# Patient Record
Sex: Female | Born: 1960 | Race: White | Hispanic: No | Marital: Single | Smoking: Current every day smoker
Health system: Southern US, Community
[De-identification: ages and names within clinical notes are randomized; demographics above are authoritative.]

## PROBLEM LIST (undated history)

## (undated) DIAGNOSIS — J449 Chronic obstructive pulmonary disease, unspecified: Secondary | ICD-10-CM

## (undated) DIAGNOSIS — N289 Disorder of kidney and ureter, unspecified: Secondary | ICD-10-CM

## (undated) DIAGNOSIS — M359 Systemic involvement of connective tissue, unspecified: Secondary | ICD-10-CM

## (undated) DIAGNOSIS — K589 Irritable bowel syndrome without diarrhea: Secondary | ICD-10-CM

## (undated) DIAGNOSIS — R269 Unspecified abnormalities of gait and mobility: Secondary | ICD-10-CM

## (undated) DIAGNOSIS — M4800 Spinal stenosis, site unspecified: Secondary | ICD-10-CM

## (undated) DIAGNOSIS — I10 Essential (primary) hypertension: Secondary | ICD-10-CM

## (undated) DIAGNOSIS — M1990 Unspecified osteoarthritis, unspecified site: Secondary | ICD-10-CM

## (undated) DIAGNOSIS — W19XXXA Unspecified fall, initial encounter: Secondary | ICD-10-CM

## (undated) DIAGNOSIS — E041 Nontoxic single thyroid nodule: Secondary | ICD-10-CM

## (undated) DIAGNOSIS — I2510 Atherosclerotic heart disease of native coronary artery without angina pectoris: Secondary | ICD-10-CM

## (undated) DIAGNOSIS — T7840XA Allergy, unspecified, initial encounter: Secondary | ICD-10-CM

## (undated) DIAGNOSIS — F419 Anxiety disorder, unspecified: Secondary | ICD-10-CM

## (undated) DIAGNOSIS — K635 Polyp of colon: Secondary | ICD-10-CM

## (undated) DIAGNOSIS — G43909 Migraine, unspecified, not intractable, without status migrainosus: Secondary | ICD-10-CM

## (undated) DIAGNOSIS — M797 Fibromyalgia: Secondary | ICD-10-CM

## (undated) DIAGNOSIS — F319 Bipolar disorder, unspecified: Secondary | ICD-10-CM

## (undated) DIAGNOSIS — N189 Chronic kidney disease, unspecified: Secondary | ICD-10-CM

## (undated) DIAGNOSIS — G9349 Other encephalopathy: Secondary | ICD-10-CM

## (undated) DIAGNOSIS — M549 Dorsalgia, unspecified: Secondary | ICD-10-CM

## (undated) DIAGNOSIS — E785 Hyperlipidemia, unspecified: Secondary | ICD-10-CM

## (undated) DIAGNOSIS — R202 Paresthesia of skin: Secondary | ICD-10-CM

## (undated) DIAGNOSIS — K219 Gastro-esophageal reflux disease without esophagitis: Secondary | ICD-10-CM

## (undated) DIAGNOSIS — G459 Transient cerebral ischemic attack, unspecified: Secondary | ICD-10-CM

## (undated) DIAGNOSIS — R296 Repeated falls: Secondary | ICD-10-CM

## (undated) DIAGNOSIS — R200 Anesthesia of skin: Secondary | ICD-10-CM

## (undated) HISTORY — DX: Unspecified osteoarthritis, unspecified site: M19.90

## (undated) HISTORY — DX: Anxiety disorder, unspecified: F41.9

## (undated) HISTORY — PX: TUBAL LIGATION: SHX77

## (undated) HISTORY — DX: Hyperlipidemia, unspecified: E78.5

## (undated) HISTORY — DX: Allergy, unspecified, initial encounter: T78.40XA

## (undated) HISTORY — DX: Transient cerebral ischemic attack, unspecified: G45.9

## (undated) HISTORY — DX: Polyp of colon: K63.5

## (undated) HISTORY — PX: MOUTH SURGERY: SHX715

## (undated) HISTORY — DX: Migraine, unspecified, not intractable, without status migrainosus: G43.909

## (undated) HISTORY — DX: Essential (primary) hypertension: I10

## (undated) HISTORY — DX: Fibromyalgia: M79.7

## (undated) HISTORY — DX: Gastro-esophageal reflux disease without esophagitis: K21.9

## (undated) HISTORY — PX: NASAL SINUS SURGERY: SHX719

## (undated) HISTORY — DX: Chronic kidney disease, unspecified: N18.9

## (undated) HISTORY — DX: Irritable bowel syndrome, unspecified: K58.9

---

## 2003-06-05 DIAGNOSIS — I639 Cerebral infarction, unspecified: Secondary | ICD-10-CM

## 2003-06-05 HISTORY — DX: Cerebral infarction, unspecified: I63.9

## 2003-10-31 ENCOUNTER — Other Ambulatory Visit: Payer: Self-pay

## 2004-06-16 ENCOUNTER — Emergency Department: Payer: Self-pay | Admitting: Emergency Medicine

## 2007-01-11 ENCOUNTER — Emergency Department: Payer: Self-pay | Admitting: Emergency Medicine

## 2008-01-14 ENCOUNTER — Emergency Department: Payer: Self-pay | Admitting: Emergency Medicine

## 2008-01-14 ENCOUNTER — Other Ambulatory Visit: Payer: Self-pay

## 2009-04-18 ENCOUNTER — Ambulatory Visit: Payer: Self-pay | Admitting: Family Medicine

## 2009-05-03 ENCOUNTER — Encounter: Payer: Self-pay | Admitting: Family Medicine

## 2009-05-04 ENCOUNTER — Encounter: Payer: Self-pay | Admitting: Family Medicine

## 2009-06-04 ENCOUNTER — Encounter: Payer: Self-pay | Admitting: Family Medicine

## 2009-09-27 ENCOUNTER — Ambulatory Visit: Payer: Self-pay | Admitting: Pain Medicine

## 2009-10-03 ENCOUNTER — Ambulatory Visit: Payer: Self-pay | Admitting: Pain Medicine

## 2009-10-11 ENCOUNTER — Ambulatory Visit: Payer: Self-pay | Admitting: Pain Medicine

## 2009-11-01 ENCOUNTER — Ambulatory Visit: Payer: Self-pay | Admitting: Pain Medicine

## 2009-11-09 ENCOUNTER — Ambulatory Visit: Payer: Self-pay | Admitting: Pain Medicine

## 2009-11-28 ENCOUNTER — Ambulatory Visit: Payer: Self-pay | Admitting: Rheumatology

## 2009-11-29 ENCOUNTER — Ambulatory Visit: Payer: Self-pay | Admitting: Pain Medicine

## 2009-12-07 ENCOUNTER — Ambulatory Visit: Payer: Self-pay | Admitting: Pain Medicine

## 2009-12-09 ENCOUNTER — Emergency Department: Payer: Self-pay | Admitting: Emergency Medicine

## 2009-12-30 ENCOUNTER — Observation Stay: Payer: Self-pay | Admitting: Internal Medicine

## 2010-01-10 ENCOUNTER — Ambulatory Visit: Payer: Self-pay | Admitting: Pain Medicine

## 2010-02-13 ENCOUNTER — Emergency Department: Payer: Self-pay | Admitting: Emergency Medicine

## 2010-02-21 ENCOUNTER — Encounter: Payer: Self-pay | Admitting: Rheumatology

## 2010-07-15 ENCOUNTER — Emergency Department: Payer: Self-pay | Admitting: Emergency Medicine

## 2010-10-16 ENCOUNTER — Ambulatory Visit: Payer: Self-pay | Admitting: Pain Medicine

## 2012-01-08 DIAGNOSIS — G4700 Insomnia, unspecified: Secondary | ICD-10-CM | POA: Diagnosis not present

## 2012-01-08 DIAGNOSIS — Z23 Encounter for immunization: Secondary | ICD-10-CM | POA: Diagnosis not present

## 2012-01-08 DIAGNOSIS — R7309 Other abnormal glucose: Secondary | ICD-10-CM | POA: Diagnosis not present

## 2012-01-08 DIAGNOSIS — IMO0001 Reserved for inherently not codable concepts without codable children: Secondary | ICD-10-CM | POA: Diagnosis not present

## 2012-01-08 DIAGNOSIS — F319 Bipolar disorder, unspecified: Secondary | ICD-10-CM | POA: Diagnosis not present

## 2012-01-10 DIAGNOSIS — R7309 Other abnormal glucose: Secondary | ICD-10-CM | POA: Diagnosis not present

## 2012-01-10 DIAGNOSIS — I10 Essential (primary) hypertension: Secondary | ICD-10-CM | POA: Diagnosis not present

## 2012-05-12 DIAGNOSIS — Z23 Encounter for immunization: Secondary | ICD-10-CM | POA: Diagnosis not present

## 2012-12-03 DIAGNOSIS — IMO0002 Reserved for concepts with insufficient information to code with codable children: Secondary | ICD-10-CM | POA: Diagnosis not present

## 2012-12-03 DIAGNOSIS — M25559 Pain in unspecified hip: Secondary | ICD-10-CM | POA: Diagnosis not present

## 2012-12-03 DIAGNOSIS — IMO0001 Reserved for inherently not codable concepts without codable children: Secondary | ICD-10-CM | POA: Diagnosis not present

## 2012-12-16 DIAGNOSIS — R7309 Other abnormal glucose: Secondary | ICD-10-CM | POA: Diagnosis not present

## 2012-12-16 DIAGNOSIS — Z8673 Personal history of transient ischemic attack (TIA), and cerebral infarction without residual deficits: Secondary | ICD-10-CM | POA: Diagnosis not present

## 2012-12-16 DIAGNOSIS — IMO0002 Reserved for concepts with insufficient information to code with codable children: Secondary | ICD-10-CM | POA: Diagnosis not present

## 2012-12-16 DIAGNOSIS — I10 Essential (primary) hypertension: Secondary | ICD-10-CM | POA: Diagnosis not present

## 2012-12-22 DIAGNOSIS — K589 Irritable bowel syndrome without diarrhea: Secondary | ICD-10-CM | POA: Diagnosis not present

## 2012-12-22 DIAGNOSIS — IMO0002 Reserved for concepts with insufficient information to code with codable children: Secondary | ICD-10-CM | POA: Diagnosis not present

## 2012-12-22 DIAGNOSIS — R319 Hematuria, unspecified: Secondary | ICD-10-CM | POA: Diagnosis not present

## 2012-12-23 DIAGNOSIS — I10 Essential (primary) hypertension: Secondary | ICD-10-CM | POA: Diagnosis not present

## 2012-12-23 DIAGNOSIS — M329 Systemic lupus erythematosus, unspecified: Secondary | ICD-10-CM | POA: Diagnosis not present

## 2012-12-23 DIAGNOSIS — R319 Hematuria, unspecified: Secondary | ICD-10-CM | POA: Diagnosis not present

## 2012-12-23 DIAGNOSIS — R7309 Other abnormal glucose: Secondary | ICD-10-CM | POA: Diagnosis not present

## 2012-12-23 DIAGNOSIS — Z1159 Encounter for screening for other viral diseases: Secondary | ICD-10-CM | POA: Diagnosis not present

## 2012-12-23 DIAGNOSIS — E785 Hyperlipidemia, unspecified: Secondary | ICD-10-CM | POA: Diagnosis not present

## 2012-12-29 ENCOUNTER — Ambulatory Visit: Payer: Self-pay | Admitting: Family Medicine

## 2012-12-29 DIAGNOSIS — M5137 Other intervertebral disc degeneration, lumbosacral region: Secondary | ICD-10-CM | POA: Diagnosis not present

## 2012-12-29 DIAGNOSIS — M545 Low back pain, unspecified: Secondary | ICD-10-CM | POA: Diagnosis not present

## 2012-12-29 DIAGNOSIS — N289 Disorder of kidney and ureter, unspecified: Secondary | ICD-10-CM | POA: Diagnosis not present

## 2012-12-30 ENCOUNTER — Ambulatory Visit: Payer: Self-pay | Admitting: Family Medicine

## 2012-12-30 DIAGNOSIS — N289 Disorder of kidney and ureter, unspecified: Secondary | ICD-10-CM | POA: Diagnosis not present

## 2012-12-30 DIAGNOSIS — R948 Abnormal results of function studies of other organs and systems: Secondary | ICD-10-CM | POA: Diagnosis not present

## 2012-12-30 DIAGNOSIS — M549 Dorsalgia, unspecified: Secondary | ICD-10-CM | POA: Diagnosis not present

## 2012-12-30 DIAGNOSIS — R52 Pain, unspecified: Secondary | ICD-10-CM | POA: Diagnosis not present

## 2012-12-30 DIAGNOSIS — R944 Abnormal results of kidney function studies: Secondary | ICD-10-CM | POA: Diagnosis not present

## 2012-12-31 DIAGNOSIS — IMO0002 Reserved for concepts with insufficient information to code with codable children: Secondary | ICD-10-CM | POA: Diagnosis not present

## 2012-12-31 DIAGNOSIS — R9389 Abnormal findings on diagnostic imaging of other specified body structures: Secondary | ICD-10-CM | POA: Diagnosis not present

## 2013-01-12 ENCOUNTER — Ambulatory Visit: Payer: Self-pay | Admitting: Gastroenterology

## 2013-01-12 DIAGNOSIS — Z887 Allergy status to serum and vaccine status: Secondary | ICD-10-CM | POA: Diagnosis not present

## 2013-01-12 DIAGNOSIS — F17200 Nicotine dependence, unspecified, uncomplicated: Secondary | ICD-10-CM | POA: Diagnosis not present

## 2013-01-12 DIAGNOSIS — M4800 Spinal stenosis, site unspecified: Secondary | ICD-10-CM | POA: Diagnosis not present

## 2013-01-12 DIAGNOSIS — R1310 Dysphagia, unspecified: Secondary | ICD-10-CM | POA: Diagnosis not present

## 2013-01-12 DIAGNOSIS — Z885 Allergy status to narcotic agent status: Secondary | ICD-10-CM | POA: Diagnosis not present

## 2013-01-12 DIAGNOSIS — I10 Essential (primary) hypertension: Secondary | ICD-10-CM | POA: Diagnosis not present

## 2013-01-12 DIAGNOSIS — Z1211 Encounter for screening for malignant neoplasm of colon: Secondary | ICD-10-CM | POA: Diagnosis not present

## 2013-01-12 DIAGNOSIS — Z79899 Other long term (current) drug therapy: Secondary | ICD-10-CM | POA: Diagnosis not present

## 2013-01-12 DIAGNOSIS — D126 Benign neoplasm of colon, unspecified: Secondary | ICD-10-CM | POA: Diagnosis not present

## 2013-01-12 DIAGNOSIS — M329 Systemic lupus erythematosus, unspecified: Secondary | ICD-10-CM | POA: Diagnosis not present

## 2013-01-12 DIAGNOSIS — K648 Other hemorrhoids: Secondary | ICD-10-CM | POA: Diagnosis not present

## 2013-01-13 LAB — PATHOLOGY REPORT

## 2013-01-14 DIAGNOSIS — IMO0001 Reserved for inherently not codable concepts without codable children: Secondary | ICD-10-CM | POA: Diagnosis not present

## 2013-01-27 DIAGNOSIS — IMO0002 Reserved for concepts with insufficient information to code with codable children: Secondary | ICD-10-CM | POA: Diagnosis not present

## 2013-01-27 DIAGNOSIS — M5137 Other intervertebral disc degeneration, lumbosacral region: Secondary | ICD-10-CM | POA: Diagnosis not present

## 2013-04-06 DIAGNOSIS — Z124 Encounter for screening for malignant neoplasm of cervix: Secondary | ICD-10-CM | POA: Diagnosis not present

## 2013-04-06 DIAGNOSIS — Z0000 Encounter for general adult medical examination without abnormal findings: Secondary | ICD-10-CM | POA: Diagnosis not present

## 2013-04-06 DIAGNOSIS — Z23 Encounter for immunization: Secondary | ICD-10-CM | POA: Diagnosis not present

## 2013-04-06 DIAGNOSIS — Z1211 Encounter for screening for malignant neoplasm of colon: Secondary | ICD-10-CM | POA: Diagnosis not present

## 2013-04-06 DIAGNOSIS — Q619 Cystic kidney disease, unspecified: Secondary | ICD-10-CM | POA: Diagnosis not present

## 2013-04-06 LAB — HM MAMMOGRAPHY

## 2013-04-06 LAB — HM COLONOSCOPY

## 2013-04-06 LAB — HM PAP SMEAR

## 2013-04-09 ENCOUNTER — Ambulatory Visit: Payer: Self-pay | Admitting: Family Medicine

## 2013-04-09 DIAGNOSIS — N289 Disorder of kidney and ureter, unspecified: Secondary | ICD-10-CM | POA: Diagnosis not present

## 2013-04-09 DIAGNOSIS — IMO0002 Reserved for concepts with insufficient information to code with codable children: Secondary | ICD-10-CM | POA: Diagnosis not present

## 2013-04-09 DIAGNOSIS — I10 Essential (primary) hypertension: Secondary | ICD-10-CM | POA: Diagnosis not present

## 2013-04-09 DIAGNOSIS — F319 Bipolar disorder, unspecified: Secondary | ICD-10-CM | POA: Diagnosis not present

## 2013-04-09 DIAGNOSIS — IMO0001 Reserved for inherently not codable concepts without codable children: Secondary | ICD-10-CM | POA: Diagnosis not present

## 2013-04-09 DIAGNOSIS — N200 Calculus of kidney: Secondary | ICD-10-CM | POA: Diagnosis not present

## 2013-05-18 DIAGNOSIS — M1990 Unspecified osteoarthritis, unspecified site: Secondary | ICD-10-CM | POA: Diagnosis not present

## 2013-05-18 DIAGNOSIS — M549 Dorsalgia, unspecified: Secondary | ICD-10-CM | POA: Diagnosis not present

## 2013-05-18 DIAGNOSIS — G5600 Carpal tunnel syndrome, unspecified upper limb: Secondary | ICD-10-CM | POA: Diagnosis not present

## 2013-05-19 DIAGNOSIS — E785 Hyperlipidemia, unspecified: Secondary | ICD-10-CM | POA: Diagnosis not present

## 2013-05-19 DIAGNOSIS — M329 Systemic lupus erythematosus, unspecified: Secondary | ICD-10-CM | POA: Diagnosis not present

## 2013-05-19 DIAGNOSIS — I10 Essential (primary) hypertension: Secondary | ICD-10-CM | POA: Diagnosis not present

## 2013-05-19 DIAGNOSIS — R7309 Other abnormal glucose: Secondary | ICD-10-CM | POA: Diagnosis not present

## 2013-05-21 DIAGNOSIS — N289 Disorder of kidney and ureter, unspecified: Secondary | ICD-10-CM | POA: Diagnosis not present

## 2013-05-21 DIAGNOSIS — R310 Gross hematuria: Secondary | ICD-10-CM | POA: Diagnosis not present

## 2013-06-30 DIAGNOSIS — I10 Essential (primary) hypertension: Secondary | ICD-10-CM | POA: Diagnosis not present

## 2013-06-30 DIAGNOSIS — D631 Anemia in chronic kidney disease: Secondary | ICD-10-CM | POA: Diagnosis not present

## 2013-06-30 DIAGNOSIS — N2581 Secondary hyperparathyroidism of renal origin: Secondary | ICD-10-CM | POA: Diagnosis not present

## 2013-06-30 DIAGNOSIS — N183 Chronic kidney disease, stage 3 unspecified: Secondary | ICD-10-CM | POA: Diagnosis not present

## 2013-07-14 DIAGNOSIS — M76899 Other specified enthesopathies of unspecified lower limb, excluding foot: Secondary | ICD-10-CM | POA: Diagnosis not present

## 2013-07-14 DIAGNOSIS — K219 Gastro-esophageal reflux disease without esophagitis: Secondary | ICD-10-CM | POA: Diagnosis not present

## 2013-07-14 DIAGNOSIS — I10 Essential (primary) hypertension: Secondary | ICD-10-CM | POA: Diagnosis not present

## 2013-07-14 DIAGNOSIS — G4700 Insomnia, unspecified: Secondary | ICD-10-CM | POA: Diagnosis not present

## 2013-08-25 DIAGNOSIS — F3131 Bipolar disorder, current episode depressed, mild: Secondary | ICD-10-CM | POA: Diagnosis not present

## 2013-08-25 DIAGNOSIS — G4700 Insomnia, unspecified: Secondary | ICD-10-CM | POA: Diagnosis not present

## 2013-08-25 DIAGNOSIS — I10 Essential (primary) hypertension: Secondary | ICD-10-CM | POA: Diagnosis not present

## 2013-08-25 DIAGNOSIS — E785 Hyperlipidemia, unspecified: Secondary | ICD-10-CM | POA: Diagnosis not present

## 2013-09-01 DIAGNOSIS — I10 Essential (primary) hypertension: Secondary | ICD-10-CM | POA: Diagnosis not present

## 2013-09-01 DIAGNOSIS — D631 Anemia in chronic kidney disease: Secondary | ICD-10-CM | POA: Diagnosis not present

## 2013-09-01 DIAGNOSIS — N039 Chronic nephritic syndrome with unspecified morphologic changes: Secondary | ICD-10-CM | POA: Diagnosis not present

## 2013-09-01 DIAGNOSIS — N2581 Secondary hyperparathyroidism of renal origin: Secondary | ICD-10-CM | POA: Diagnosis not present

## 2013-09-01 DIAGNOSIS — N183 Chronic kidney disease, stage 3 unspecified: Secondary | ICD-10-CM | POA: Diagnosis not present

## 2013-09-12 ENCOUNTER — Emergency Department: Payer: Self-pay | Admitting: Emergency Medicine

## 2013-09-12 DIAGNOSIS — R51 Headache: Secondary | ICD-10-CM | POA: Diagnosis not present

## 2013-09-12 DIAGNOSIS — Z8673 Personal history of transient ischemic attack (TIA), and cerebral infarction without residual deficits: Secondary | ICD-10-CM | POA: Diagnosis not present

## 2013-09-12 DIAGNOSIS — F410 Panic disorder [episodic paroxysmal anxiety] without agoraphobia: Secondary | ICD-10-CM | POA: Diagnosis not present

## 2013-09-12 DIAGNOSIS — I10 Essential (primary) hypertension: Secondary | ICD-10-CM | POA: Diagnosis not present

## 2013-09-12 DIAGNOSIS — R209 Unspecified disturbances of skin sensation: Secondary | ICD-10-CM | POA: Diagnosis not present

## 2013-09-12 DIAGNOSIS — Z9889 Other specified postprocedural states: Secondary | ICD-10-CM | POA: Diagnosis not present

## 2013-09-12 DIAGNOSIS — F17200 Nicotine dependence, unspecified, uncomplicated: Secondary | ICD-10-CM | POA: Diagnosis not present

## 2013-09-12 DIAGNOSIS — R197 Diarrhea, unspecified: Secondary | ICD-10-CM | POA: Diagnosis not present

## 2013-09-12 LAB — COMPREHENSIVE METABOLIC PANEL
ALK PHOS: 105 U/L
ALT: 13 U/L (ref 12–78)
Albumin: 3.7 g/dL (ref 3.4–5.0)
Anion Gap: 2 — ABNORMAL LOW (ref 7–16)
BILIRUBIN TOTAL: 0.3 mg/dL (ref 0.2–1.0)
BUN: 28 mg/dL — AB (ref 7–18)
CALCIUM: 9.7 mg/dL (ref 8.5–10.1)
CHLORIDE: 107 mmol/L (ref 98–107)
Co2: 31 mmol/L (ref 21–32)
Creatinine: 1.5 mg/dL — ABNORMAL HIGH (ref 0.60–1.30)
EGFR (African American): 46 — ABNORMAL LOW
GFR CALC NON AF AMER: 40 — AB
GLUCOSE: 82 mg/dL (ref 65–99)
Osmolality: 284 (ref 275–301)
POTASSIUM: 4.2 mmol/L (ref 3.5–5.1)
SGOT(AST): 10 U/L — ABNORMAL LOW (ref 15–37)
Sodium: 140 mmol/L (ref 136–145)
Total Protein: 7.5 g/dL (ref 6.4–8.2)

## 2013-09-12 LAB — CBC WITH DIFFERENTIAL/PLATELET
BASOS PCT: 1.2 %
Basophil #: 0.1 10*3/uL (ref 0.0–0.1)
EOS ABS: 0.1 10*3/uL (ref 0.0–0.7)
Eosinophil %: 1.3 %
HCT: 41.4 % (ref 35.0–47.0)
HGB: 13.7 g/dL (ref 12.0–16.0)
LYMPHS ABS: 3.4 10*3/uL (ref 1.0–3.6)
Lymphocyte %: 38.4 %
MCH: 30.7 pg (ref 26.0–34.0)
MCHC: 33.1 g/dL (ref 32.0–36.0)
MCV: 93 fL (ref 80–100)
Monocyte #: 0.7 x10 3/mm (ref 0.2–0.9)
Monocyte %: 8.5 %
NEUTROS PCT: 50.6 %
Neutrophil #: 4.5 10*3/uL (ref 1.4–6.5)
Platelet: 227 10*3/uL (ref 150–440)
RBC: 4.46 10*6/uL (ref 3.80–5.20)
RDW: 12.9 % (ref 11.5–14.5)
WBC: 8.8 10*3/uL (ref 3.6–11.0)

## 2013-09-12 LAB — LIPASE, BLOOD: Lipase: 175 U/L (ref 73–393)

## 2013-10-29 DIAGNOSIS — F411 Generalized anxiety disorder: Secondary | ICD-10-CM | POA: Diagnosis not present

## 2013-10-29 DIAGNOSIS — I10 Essential (primary) hypertension: Secondary | ICD-10-CM | POA: Diagnosis not present

## 2013-10-29 DIAGNOSIS — M545 Low back pain, unspecified: Secondary | ICD-10-CM | POA: Diagnosis not present

## 2013-10-29 DIAGNOSIS — M549 Dorsalgia, unspecified: Secondary | ICD-10-CM | POA: Diagnosis not present

## 2013-10-29 DIAGNOSIS — F3131 Bipolar disorder, current episode depressed, mild: Secondary | ICD-10-CM | POA: Diagnosis not present

## 2013-10-29 DIAGNOSIS — G4700 Insomnia, unspecified: Secondary | ICD-10-CM | POA: Diagnosis not present

## 2013-11-16 DIAGNOSIS — G4700 Insomnia, unspecified: Secondary | ICD-10-CM | POA: Diagnosis not present

## 2013-11-16 DIAGNOSIS — F411 Generalized anxiety disorder: Secondary | ICD-10-CM | POA: Diagnosis not present

## 2013-11-16 DIAGNOSIS — F3131 Bipolar disorder, current episode depressed, mild: Secondary | ICD-10-CM | POA: Diagnosis not present

## 2013-11-16 DIAGNOSIS — M549 Dorsalgia, unspecified: Secondary | ICD-10-CM | POA: Diagnosis not present

## 2013-11-17 DIAGNOSIS — S91309A Unspecified open wound, unspecified foot, initial encounter: Secondary | ICD-10-CM | POA: Diagnosis not present

## 2013-11-20 DIAGNOSIS — S90129A Contusion of unspecified lesser toe(s) without damage to nail, initial encounter: Secondary | ICD-10-CM | POA: Diagnosis not present

## 2013-12-02 DIAGNOSIS — G8929 Other chronic pain: Secondary | ICD-10-CM | POA: Diagnosis not present

## 2013-12-02 DIAGNOSIS — M76899 Other specified enthesopathies of unspecified lower limb, excluding foot: Secondary | ICD-10-CM | POA: Diagnosis not present

## 2013-12-02 DIAGNOSIS — I10 Essential (primary) hypertension: Secondary | ICD-10-CM | POA: Diagnosis not present

## 2013-12-02 DIAGNOSIS — G4700 Insomnia, unspecified: Secondary | ICD-10-CM | POA: Diagnosis not present

## 2013-12-22 ENCOUNTER — Ambulatory Visit: Payer: Self-pay | Admitting: Urology

## 2013-12-22 DIAGNOSIS — Q619 Cystic kidney disease, unspecified: Secondary | ICD-10-CM | POA: Diagnosis not present

## 2013-12-22 DIAGNOSIS — E785 Hyperlipidemia, unspecified: Secondary | ICD-10-CM | POA: Diagnosis not present

## 2013-12-22 DIAGNOSIS — E278 Other specified disorders of adrenal gland: Secondary | ICD-10-CM | POA: Diagnosis not present

## 2013-12-22 DIAGNOSIS — K7689 Other specified diseases of liver: Secondary | ICD-10-CM | POA: Diagnosis not present

## 2013-12-22 DIAGNOSIS — R9389 Abnormal findings on diagnostic imaging of other specified body structures: Secondary | ICD-10-CM | POA: Diagnosis not present

## 2013-12-22 DIAGNOSIS — N281 Cyst of kidney, acquired: Secondary | ICD-10-CM | POA: Diagnosis not present

## 2013-12-22 DIAGNOSIS — I10 Essential (primary) hypertension: Secondary | ICD-10-CM | POA: Diagnosis not present

## 2013-12-22 DIAGNOSIS — G4700 Insomnia, unspecified: Secondary | ICD-10-CM | POA: Diagnosis not present

## 2014-01-04 DIAGNOSIS — N2581 Secondary hyperparathyroidism of renal origin: Secondary | ICD-10-CM | POA: Diagnosis not present

## 2014-01-04 DIAGNOSIS — N039 Chronic nephritic syndrome with unspecified morphologic changes: Secondary | ICD-10-CM | POA: Diagnosis not present

## 2014-01-04 DIAGNOSIS — I10 Essential (primary) hypertension: Secondary | ICD-10-CM | POA: Diagnosis not present

## 2014-01-04 DIAGNOSIS — N183 Chronic kidney disease, stage 3 unspecified: Secondary | ICD-10-CM | POA: Diagnosis not present

## 2014-01-04 DIAGNOSIS — Q619 Cystic kidney disease, unspecified: Secondary | ICD-10-CM | POA: Diagnosis not present

## 2014-01-04 DIAGNOSIS — D631 Anemia in chronic kidney disease: Secondary | ICD-10-CM | POA: Diagnosis not present

## 2014-01-08 DIAGNOSIS — N390 Urinary tract infection, site not specified: Secondary | ICD-10-CM | POA: Diagnosis not present

## 2014-01-08 DIAGNOSIS — N183 Chronic kidney disease, stage 3 unspecified: Secondary | ICD-10-CM | POA: Diagnosis not present

## 2014-01-08 DIAGNOSIS — N2581 Secondary hyperparathyroidism of renal origin: Secondary | ICD-10-CM | POA: Diagnosis not present

## 2014-01-08 DIAGNOSIS — I10 Essential (primary) hypertension: Secondary | ICD-10-CM | POA: Diagnosis not present

## 2014-01-08 DIAGNOSIS — N039 Chronic nephritic syndrome with unspecified morphologic changes: Secondary | ICD-10-CM | POA: Diagnosis not present

## 2014-01-08 DIAGNOSIS — D631 Anemia in chronic kidney disease: Secondary | ICD-10-CM | POA: Diagnosis not present

## 2014-01-25 DIAGNOSIS — G4700 Insomnia, unspecified: Secondary | ICD-10-CM | POA: Diagnosis not present

## 2014-01-25 DIAGNOSIS — F3131 Bipolar disorder, current episode depressed, mild: Secondary | ICD-10-CM | POA: Diagnosis not present

## 2014-01-25 DIAGNOSIS — Z23 Encounter for immunization: Secondary | ICD-10-CM | POA: Diagnosis not present

## 2014-01-25 DIAGNOSIS — I10 Essential (primary) hypertension: Secondary | ICD-10-CM | POA: Diagnosis not present

## 2014-01-25 DIAGNOSIS — E785 Hyperlipidemia, unspecified: Secondary | ICD-10-CM | POA: Diagnosis not present

## 2014-02-09 ENCOUNTER — Observation Stay: Payer: Self-pay | Admitting: Internal Medicine

## 2014-02-09 DIAGNOSIS — R5381 Other malaise: Secondary | ICD-10-CM | POA: Diagnosis not present

## 2014-02-09 DIAGNOSIS — R197 Diarrhea, unspecified: Secondary | ICD-10-CM | POA: Diagnosis not present

## 2014-02-09 DIAGNOSIS — IMO0001 Reserved for inherently not codable concepts without codable children: Secondary | ICD-10-CM | POA: Diagnosis not present

## 2014-02-09 DIAGNOSIS — Z87442 Personal history of urinary calculi: Secondary | ICD-10-CM | POA: Diagnosis not present

## 2014-02-09 DIAGNOSIS — R222 Localized swelling, mass and lump, trunk: Secondary | ICD-10-CM | POA: Diagnosis not present

## 2014-02-09 DIAGNOSIS — E876 Hypokalemia: Secondary | ICD-10-CM | POA: Diagnosis not present

## 2014-02-09 DIAGNOSIS — N183 Chronic kidney disease, stage 3 unspecified: Secondary | ICD-10-CM | POA: Diagnosis not present

## 2014-02-09 DIAGNOSIS — F4310 Post-traumatic stress disorder, unspecified: Secondary | ICD-10-CM | POA: Diagnosis not present

## 2014-02-09 DIAGNOSIS — Z8673 Personal history of transient ischemic attack (TIA), and cerebral infarction without residual deficits: Secondary | ICD-10-CM | POA: Diagnosis not present

## 2014-02-09 DIAGNOSIS — R112 Nausea with vomiting, unspecified: Secondary | ICD-10-CM | POA: Diagnosis not present

## 2014-02-09 DIAGNOSIS — I129 Hypertensive chronic kidney disease with stage 1 through stage 4 chronic kidney disease, or unspecified chronic kidney disease: Secondary | ICD-10-CM | POA: Diagnosis not present

## 2014-02-09 DIAGNOSIS — I10 Essential (primary) hypertension: Secondary | ICD-10-CM | POA: Diagnosis not present

## 2014-02-09 DIAGNOSIS — Z79899 Other long term (current) drug therapy: Secondary | ICD-10-CM | POA: Diagnosis not present

## 2014-02-09 DIAGNOSIS — R079 Chest pain, unspecified: Secondary | ICD-10-CM | POA: Diagnosis not present

## 2014-02-09 DIAGNOSIS — N189 Chronic kidney disease, unspecified: Secondary | ICD-10-CM | POA: Diagnosis not present

## 2014-02-09 DIAGNOSIS — R5383 Other fatigue: Secondary | ICD-10-CM | POA: Diagnosis not present

## 2014-02-09 DIAGNOSIS — E663 Overweight: Secondary | ICD-10-CM | POA: Diagnosis not present

## 2014-02-09 DIAGNOSIS — M79609 Pain in unspecified limb: Secondary | ICD-10-CM | POA: Diagnosis not present

## 2014-02-09 DIAGNOSIS — F329 Major depressive disorder, single episode, unspecified: Secondary | ICD-10-CM | POA: Diagnosis not present

## 2014-02-09 DIAGNOSIS — F411 Generalized anxiety disorder: Secondary | ICD-10-CM | POA: Diagnosis not present

## 2014-02-09 DIAGNOSIS — Z6826 Body mass index (BMI) 26.0-26.9, adult: Secondary | ICD-10-CM | POA: Diagnosis not present

## 2014-02-09 DIAGNOSIS — R55 Syncope and collapse: Secondary | ICD-10-CM | POA: Diagnosis not present

## 2014-02-09 DIAGNOSIS — F3289 Other specified depressive episodes: Secondary | ICD-10-CM | POA: Diagnosis not present

## 2014-02-09 DIAGNOSIS — F429 Obsessive-compulsive disorder, unspecified: Secondary | ICD-10-CM | POA: Diagnosis not present

## 2014-02-09 DIAGNOSIS — N179 Acute kidney failure, unspecified: Secondary | ICD-10-CM | POA: Diagnosis not present

## 2014-02-09 LAB — COMPREHENSIVE METABOLIC PANEL
ALBUMIN: 3.8 g/dL (ref 3.4–5.0)
AST: 8 U/L — AB (ref 15–37)
Alkaline Phosphatase: 90 U/L
Anion Gap: 9 (ref 7–16)
BUN: 28 mg/dL — ABNORMAL HIGH (ref 7–18)
Bilirubin,Total: 0.3 mg/dL (ref 0.2–1.0)
CALCIUM: 9.6 mg/dL (ref 8.5–10.1)
CHLORIDE: 103 mmol/L (ref 98–107)
Co2: 24 mmol/L (ref 21–32)
Creatinine: 3.09 mg/dL — ABNORMAL HIGH (ref 0.60–1.30)
EGFR (Non-African Amer.): 17 — ABNORMAL LOW
GFR CALC AF AMER: 19 — AB
Glucose: 97 mg/dL (ref 65–99)
OSMOLALITY: 277 (ref 275–301)
Potassium: 3.1 mmol/L — ABNORMAL LOW (ref 3.5–5.1)
SGPT (ALT): 12 U/L — ABNORMAL LOW
Sodium: 136 mmol/L (ref 136–145)
Total Protein: 7.7 g/dL (ref 6.4–8.2)

## 2014-02-09 LAB — CBC
HCT: 41.4 % (ref 35.0–47.0)
HGB: 13.8 g/dL (ref 12.0–16.0)
MCH: 31.3 pg (ref 26.0–34.0)
MCHC: 33.3 g/dL (ref 32.0–36.0)
MCV: 94 fL (ref 80–100)
Platelet: 222 10*3/uL (ref 150–440)
RBC: 4.4 10*6/uL (ref 3.80–5.20)
RDW: 13 % (ref 11.5–14.5)
WBC: 8.1 10*3/uL (ref 3.6–11.0)

## 2014-02-09 LAB — CK TOTAL AND CKMB (NOT AT ARMC)
CK, Total: 60 U/L
CK, Total: 60 U/L
CK-MB: <0.5 ng/mL — ABNORMAL LOW (ref 0.5–3.6)
CK-MB: <0.5 ng/mL — ABNORMAL LOW (ref 0.5–3.6)

## 2014-02-09 LAB — TROPONIN I: Troponin-I: 0.08 ng/mL — ABNORMAL HIGH

## 2014-02-10 DIAGNOSIS — E876 Hypokalemia: Secondary | ICD-10-CM | POA: Diagnosis not present

## 2014-02-10 DIAGNOSIS — R748 Abnormal levels of other serum enzymes: Secondary | ICD-10-CM | POA: Diagnosis not present

## 2014-02-10 DIAGNOSIS — R079 Chest pain, unspecified: Secondary | ICD-10-CM | POA: Diagnosis not present

## 2014-02-10 DIAGNOSIS — M549 Dorsalgia, unspecified: Secondary | ICD-10-CM | POA: Diagnosis not present

## 2014-02-10 DIAGNOSIS — I6529 Occlusion and stenosis of unspecified carotid artery: Secondary | ICD-10-CM | POA: Diagnosis not present

## 2014-02-10 DIAGNOSIS — I200 Unstable angina: Secondary | ICD-10-CM | POA: Diagnosis not present

## 2014-02-10 DIAGNOSIS — R55 Syncope and collapse: Secondary | ICD-10-CM | POA: Diagnosis not present

## 2014-02-10 DIAGNOSIS — N179 Acute kidney failure, unspecified: Secondary | ICD-10-CM | POA: Diagnosis not present

## 2014-02-10 DIAGNOSIS — F411 Generalized anxiety disorder: Secondary | ICD-10-CM | POA: Diagnosis not present

## 2014-02-10 LAB — URINALYSIS, COMPLETE
Bacteria: NONE SEEN
Bilirubin,UR: NEGATIVE
Blood: NEGATIVE
Glucose,UR: NEGATIVE mg/dL (ref 0–75)
Hyaline Cast: 47
Ketone: NEGATIVE
NITRITE: NEGATIVE
PH: 5 (ref 4.5–8.0)
RBC,UR: 6 /HPF (ref 0–5)
SPECIFIC GRAVITY: 1.014 (ref 1.003–1.030)
WBC UR: 34 /HPF (ref 0–5)

## 2014-02-10 LAB — BASIC METABOLIC PANEL
ANION GAP: 8 (ref 7–16)
BUN: 26 mg/dL — AB (ref 7–18)
CALCIUM: 8.1 mg/dL — AB (ref 8.5–10.1)
CHLORIDE: 106 mmol/L (ref 98–107)
Co2: 28 mmol/L (ref 21–32)
Creatinine: 2 mg/dL — ABNORMAL HIGH (ref 0.60–1.30)
EGFR (African American): 33 — ABNORMAL LOW
EGFR (Non-African Amer.): 28 — ABNORMAL LOW
GLUCOSE: 128 mg/dL — AB (ref 65–99)
OSMOLALITY: 290 (ref 275–301)
POTASSIUM: 2.9 mmol/L — AB (ref 3.5–5.1)
Sodium: 142 mmol/L (ref 136–145)

## 2014-02-10 LAB — LIPID PANEL
CHOLESTEROL: 148 mg/dL (ref 0–200)
HDL: 37 mg/dL — AB (ref 40–60)
LDL CHOLESTEROL, CALC: 92 mg/dL (ref 0–100)
TRIGLYCERIDES: 97 mg/dL (ref 0–200)
VLDL Cholesterol, Calc: 19 mg/dL (ref 5–40)

## 2014-02-10 LAB — VALPROIC ACID LEVEL: Valproic Acid: 32 ug/mL — ABNORMAL LOW

## 2014-02-10 LAB — CK TOTAL AND CKMB (NOT AT ARMC)
CK, Total: 47 U/L
CK-MB: 0.6 ng/mL (ref 0.5–3.6)

## 2014-02-10 LAB — TROPONIN I: TROPONIN-I: 0.06 ng/mL — AB

## 2014-02-10 LAB — TSH: Thyroid Stimulating Horm: 0.154 u[IU]/mL — ABNORMAL LOW

## 2014-02-11 DIAGNOSIS — R748 Abnormal levels of other serum enzymes: Secondary | ICD-10-CM | POA: Diagnosis not present

## 2014-02-11 DIAGNOSIS — I200 Unstable angina: Secondary | ICD-10-CM | POA: Diagnosis not present

## 2014-02-11 DIAGNOSIS — R55 Syncope and collapse: Secondary | ICD-10-CM | POA: Diagnosis not present

## 2014-02-11 DIAGNOSIS — G40909 Epilepsy, unspecified, not intractable, without status epilepticus: Secondary | ICD-10-CM | POA: Diagnosis not present

## 2014-02-11 LAB — POTASSIUM: POTASSIUM: 4.4 mmol/L (ref 3.5–5.1)

## 2014-02-11 LAB — CREATININE, SERUM
Creatinine: 1.42 mg/dL — ABNORMAL HIGH (ref 0.60–1.30)
EGFR (African American): 49 — ABNORMAL LOW
EGFR (Non-African Amer.): 43 — ABNORMAL LOW

## 2014-02-11 LAB — T4, FREE: FREE THYROXINE: 1.12 ng/dL (ref 0.76–1.46)

## 2014-02-13 ENCOUNTER — Emergency Department: Payer: Self-pay | Admitting: Emergency Medicine

## 2014-02-13 DIAGNOSIS — S239XXA Sprain of unspecified parts of thorax, initial encounter: Secondary | ICD-10-CM | POA: Diagnosis not present

## 2014-02-13 DIAGNOSIS — IMO0002 Reserved for concepts with insufficient information to code with codable children: Secondary | ICD-10-CM | POA: Diagnosis not present

## 2014-02-13 DIAGNOSIS — M545 Low back pain, unspecified: Secondary | ICD-10-CM | POA: Diagnosis not present

## 2014-02-13 DIAGNOSIS — S339XXA Sprain of unspecified parts of lumbar spine and pelvis, initial encounter: Secondary | ICD-10-CM | POA: Diagnosis not present

## 2014-02-13 DIAGNOSIS — I10 Essential (primary) hypertension: Secondary | ICD-10-CM | POA: Diagnosis not present

## 2014-02-13 DIAGNOSIS — R079 Chest pain, unspecified: Secondary | ICD-10-CM | POA: Diagnosis not present

## 2014-02-13 DIAGNOSIS — G8929 Other chronic pain: Secondary | ICD-10-CM | POA: Diagnosis not present

## 2014-02-14 DIAGNOSIS — M329 Systemic lupus erythematosus, unspecified: Secondary | ICD-10-CM | POA: Diagnosis not present

## 2014-02-14 DIAGNOSIS — R55 Syncope and collapse: Secondary | ICD-10-CM | POA: Diagnosis not present

## 2014-02-14 DIAGNOSIS — N183 Chronic kidney disease, stage 3 (moderate): Secondary | ICD-10-CM | POA: Diagnosis not present

## 2014-02-14 DIAGNOSIS — M797 Fibromyalgia: Secondary | ICD-10-CM | POA: Diagnosis not present

## 2014-02-14 DIAGNOSIS — F4310 Post-traumatic stress disorder, unspecified: Secondary | ICD-10-CM | POA: Diagnosis not present

## 2014-02-16 DIAGNOSIS — R55 Syncope and collapse: Secondary | ICD-10-CM | POA: Diagnosis not present

## 2014-02-16 DIAGNOSIS — M797 Fibromyalgia: Secondary | ICD-10-CM | POA: Diagnosis not present

## 2014-02-16 DIAGNOSIS — M329 Systemic lupus erythematosus, unspecified: Secondary | ICD-10-CM | POA: Diagnosis not present

## 2014-02-16 DIAGNOSIS — F4310 Post-traumatic stress disorder, unspecified: Secondary | ICD-10-CM | POA: Diagnosis not present

## 2014-02-16 DIAGNOSIS — N183 Chronic kidney disease, stage 3 (moderate): Secondary | ICD-10-CM | POA: Diagnosis not present

## 2014-02-18 DIAGNOSIS — N183 Chronic kidney disease, stage 3 (moderate): Secondary | ICD-10-CM | POA: Diagnosis not present

## 2014-02-18 DIAGNOSIS — M329 Systemic lupus erythematosus, unspecified: Secondary | ICD-10-CM | POA: Diagnosis not present

## 2014-02-18 DIAGNOSIS — R55 Syncope and collapse: Secondary | ICD-10-CM | POA: Diagnosis not present

## 2014-02-18 DIAGNOSIS — M797 Fibromyalgia: Secondary | ICD-10-CM | POA: Diagnosis not present

## 2014-02-18 DIAGNOSIS — F4310 Post-traumatic stress disorder, unspecified: Secondary | ICD-10-CM | POA: Diagnosis not present

## 2014-02-19 DIAGNOSIS — R55 Syncope and collapse: Secondary | ICD-10-CM | POA: Diagnosis not present

## 2014-02-19 DIAGNOSIS — I10 Essential (primary) hypertension: Secondary | ICD-10-CM | POA: Diagnosis not present

## 2014-02-19 DIAGNOSIS — E782 Mixed hyperlipidemia: Secondary | ICD-10-CM | POA: Insufficient documentation

## 2014-02-19 DIAGNOSIS — R079 Chest pain, unspecified: Secondary | ICD-10-CM | POA: Diagnosis not present

## 2014-02-22 DIAGNOSIS — N183 Chronic kidney disease, stage 3 (moderate): Secondary | ICD-10-CM | POA: Diagnosis not present

## 2014-02-22 DIAGNOSIS — F4310 Post-traumatic stress disorder, unspecified: Secondary | ICD-10-CM | POA: Diagnosis not present

## 2014-02-22 DIAGNOSIS — R55 Syncope and collapse: Secondary | ICD-10-CM | POA: Diagnosis not present

## 2014-02-22 DIAGNOSIS — M329 Systemic lupus erythematosus, unspecified: Secondary | ICD-10-CM | POA: Diagnosis not present

## 2014-02-22 DIAGNOSIS — M797 Fibromyalgia: Secondary | ICD-10-CM | POA: Diagnosis not present

## 2014-02-23 DIAGNOSIS — R269 Unspecified abnormalities of gait and mobility: Secondary | ICD-10-CM | POA: Diagnosis not present

## 2014-02-23 DIAGNOSIS — I10 Essential (primary) hypertension: Secondary | ICD-10-CM | POA: Diagnosis not present

## 2014-02-23 DIAGNOSIS — R946 Abnormal results of thyroid function studies: Secondary | ICD-10-CM | POA: Diagnosis not present

## 2014-02-23 DIAGNOSIS — E860 Dehydration: Secondary | ICD-10-CM | POA: Diagnosis not present

## 2014-02-23 DIAGNOSIS — F411 Generalized anxiety disorder: Secondary | ICD-10-CM | POA: Diagnosis not present

## 2014-02-23 DIAGNOSIS — I6529 Occlusion and stenosis of unspecified carotid artery: Secondary | ICD-10-CM | POA: Diagnosis not present

## 2014-02-23 DIAGNOSIS — Z09 Encounter for follow-up examination after completed treatment for conditions other than malignant neoplasm: Secondary | ICD-10-CM | POA: Diagnosis not present

## 2014-02-23 DIAGNOSIS — G4700 Insomnia, unspecified: Secondary | ICD-10-CM | POA: Diagnosis not present

## 2014-02-23 LAB — TSH: TSH: 0.49 u[IU]/mL (ref <=5.90)

## 2014-02-24 DIAGNOSIS — R55 Syncope and collapse: Secondary | ICD-10-CM | POA: Diagnosis not present

## 2014-02-24 DIAGNOSIS — M329 Systemic lupus erythematosus, unspecified: Secondary | ICD-10-CM | POA: Diagnosis not present

## 2014-02-24 DIAGNOSIS — N183 Chronic kidney disease, stage 3 (moderate): Secondary | ICD-10-CM | POA: Diagnosis not present

## 2014-02-24 DIAGNOSIS — M797 Fibromyalgia: Secondary | ICD-10-CM | POA: Diagnosis not present

## 2014-02-24 DIAGNOSIS — F4310 Post-traumatic stress disorder, unspecified: Secondary | ICD-10-CM | POA: Diagnosis not present

## 2014-02-25 DIAGNOSIS — R55 Syncope and collapse: Secondary | ICD-10-CM | POA: Diagnosis not present

## 2014-02-25 DIAGNOSIS — M797 Fibromyalgia: Secondary | ICD-10-CM | POA: Diagnosis not present

## 2014-02-25 DIAGNOSIS — N183 Chronic kidney disease, stage 3 (moderate): Secondary | ICD-10-CM | POA: Diagnosis not present

## 2014-02-25 DIAGNOSIS — F4310 Post-traumatic stress disorder, unspecified: Secondary | ICD-10-CM | POA: Diagnosis not present

## 2014-02-25 DIAGNOSIS — M329 Systemic lupus erythematosus, unspecified: Secondary | ICD-10-CM | POA: Diagnosis not present

## 2014-03-02 DIAGNOSIS — M329 Systemic lupus erythematosus, unspecified: Secondary | ICD-10-CM | POA: Diagnosis not present

## 2014-03-02 DIAGNOSIS — R55 Syncope and collapse: Secondary | ICD-10-CM | POA: Diagnosis not present

## 2014-03-02 DIAGNOSIS — F4310 Post-traumatic stress disorder, unspecified: Secondary | ICD-10-CM | POA: Diagnosis not present

## 2014-03-02 DIAGNOSIS — M797 Fibromyalgia: Secondary | ICD-10-CM | POA: Diagnosis not present

## 2014-03-02 DIAGNOSIS — N183 Chronic kidney disease, stage 3 (moderate): Secondary | ICD-10-CM | POA: Diagnosis not present

## 2014-03-04 DIAGNOSIS — N183 Chronic kidney disease, stage 3 (moderate): Secondary | ICD-10-CM | POA: Diagnosis not present

## 2014-03-04 DIAGNOSIS — R55 Syncope and collapse: Secondary | ICD-10-CM | POA: Diagnosis not present

## 2014-03-04 DIAGNOSIS — M797 Fibromyalgia: Secondary | ICD-10-CM | POA: Diagnosis not present

## 2014-03-04 DIAGNOSIS — F4310 Post-traumatic stress disorder, unspecified: Secondary | ICD-10-CM | POA: Diagnosis not present

## 2014-03-04 DIAGNOSIS — M329 Systemic lupus erythematosus, unspecified: Secondary | ICD-10-CM | POA: Diagnosis not present

## 2014-03-30 DIAGNOSIS — F3131 Bipolar disorder, current episode depressed, mild: Secondary | ICD-10-CM | POA: Diagnosis not present

## 2014-03-30 DIAGNOSIS — Z713 Dietary counseling and surveillance: Secondary | ICD-10-CM | POA: Diagnosis not present

## 2014-03-30 DIAGNOSIS — I10 Essential (primary) hypertension: Secondary | ICD-10-CM | POA: Diagnosis not present

## 2014-03-30 DIAGNOSIS — M47812 Spondylosis without myelopathy or radiculopathy, cervical region: Secondary | ICD-10-CM | POA: Diagnosis not present

## 2014-03-30 DIAGNOSIS — E663 Overweight: Secondary | ICD-10-CM | POA: Diagnosis not present

## 2014-03-30 DIAGNOSIS — F5104 Psychophysiologic insomnia: Secondary | ICD-10-CM | POA: Diagnosis not present

## 2014-03-30 DIAGNOSIS — I6522 Occlusion and stenosis of left carotid artery: Secondary | ICD-10-CM | POA: Diagnosis not present

## 2014-04-27 DIAGNOSIS — M7062 Trochanteric bursitis, left hip: Secondary | ICD-10-CM | POA: Diagnosis not present

## 2014-04-27 DIAGNOSIS — G8929 Other chronic pain: Secondary | ICD-10-CM | POA: Diagnosis not present

## 2014-04-27 DIAGNOSIS — R269 Unspecified abnormalities of gait and mobility: Secondary | ICD-10-CM | POA: Diagnosis not present

## 2014-04-27 DIAGNOSIS — Z79899 Other long term (current) drug therapy: Secondary | ICD-10-CM | POA: Diagnosis not present

## 2014-04-27 DIAGNOSIS — F419 Anxiety disorder, unspecified: Secondary | ICD-10-CM | POA: Diagnosis not present

## 2014-04-27 DIAGNOSIS — M549 Dorsalgia, unspecified: Secondary | ICD-10-CM | POA: Diagnosis not present

## 2014-04-27 DIAGNOSIS — F5104 Psychophysiologic insomnia: Secondary | ICD-10-CM | POA: Diagnosis not present

## 2014-04-27 DIAGNOSIS — E785 Hyperlipidemia, unspecified: Secondary | ICD-10-CM | POA: Diagnosis not present

## 2014-04-27 DIAGNOSIS — F3131 Bipolar disorder, current episode depressed, mild: Secondary | ICD-10-CM | POA: Diagnosis not present

## 2014-04-27 DIAGNOSIS — M329 Systemic lupus erythematosus, unspecified: Secondary | ICD-10-CM | POA: Diagnosis not present

## 2014-04-27 LAB — BASIC METABOLIC PANEL
BUN: 18 mg/dL (ref 4–21)
Creatinine: 1.2 mg/dL — AB (ref 0.5–1.1)
GLUCOSE: 88 mg/dL
Potassium: 4.4 mmol/L (ref 3.4–5.3)
Sodium: 142 mmol/L (ref 137–147)

## 2014-04-27 LAB — LIPID PANEL
Cholesterol: 154 mg/dL (ref 0–200)
HDL: 68 mg/dL (ref 35–70)
LDL Cholesterol: 71 mg/dL
LDL/HDL RATIO: 1
Triglycerides: 74 mg/dL (ref 40–160)

## 2014-04-27 LAB — CBC AND DIFFERENTIAL
HEMATOCRIT: 43 % (ref 36–46)
Hemoglobin: 14.1 g/dL (ref 12.0–16.0)
NEUTROS ABS: 4 /uL
PLATELETS: 269 10*3/uL (ref 150–399)
WBC: 6.9 10^3/mL

## 2014-04-27 LAB — HEPATIC FUNCTION PANEL
ALT: 9 U/L (ref 7–35)
AST: 10 U/L — AB (ref 13–35)
Alkaline Phosphatase: 111 U/L (ref 25–125)
BILIRUBIN, TOTAL: 0.3 mg/dL

## 2014-06-07 DIAGNOSIS — F419 Anxiety disorder, unspecified: Secondary | ICD-10-CM | POA: Diagnosis not present

## 2014-06-07 DIAGNOSIS — I10 Essential (primary) hypertension: Secondary | ICD-10-CM | POA: Diagnosis not present

## 2014-06-07 DIAGNOSIS — K088 Other specified disorders of teeth and supporting structures: Secondary | ICD-10-CM | POA: Diagnosis not present

## 2014-06-28 ENCOUNTER — Emergency Department: Payer: Self-pay | Admitting: Emergency Medicine

## 2014-06-28 DIAGNOSIS — R51 Headache: Secondary | ICD-10-CM | POA: Diagnosis not present

## 2014-06-28 DIAGNOSIS — K089 Disorder of teeth and supporting structures, unspecified: Secondary | ICD-10-CM | POA: Diagnosis not present

## 2014-06-28 DIAGNOSIS — F419 Anxiety disorder, unspecified: Secondary | ICD-10-CM | POA: Diagnosis not present

## 2014-06-28 DIAGNOSIS — Z79899 Other long term (current) drug therapy: Secondary | ICD-10-CM | POA: Diagnosis not present

## 2014-06-28 DIAGNOSIS — K088 Other specified disorders of teeth and supporting structures: Secondary | ICD-10-CM | POA: Diagnosis not present

## 2014-06-28 DIAGNOSIS — I10 Essential (primary) hypertension: Secondary | ICD-10-CM | POA: Diagnosis not present

## 2014-06-28 DIAGNOSIS — Z720 Tobacco use: Secondary | ICD-10-CM | POA: Diagnosis not present

## 2014-06-28 DIAGNOSIS — Z79891 Long term (current) use of opiate analgesic: Secondary | ICD-10-CM | POA: Diagnosis not present

## 2014-06-28 LAB — CBC WITH DIFFERENTIAL/PLATELET
BASOS ABS: 0 10*3/uL (ref 0.0–0.1)
Basophil %: 0.2 %
EOS ABS: 0.2 10*3/uL (ref 0.0–0.7)
EOS PCT: 1.7 %
HCT: 41.6 % (ref 35.0–47.0)
HGB: 13.9 g/dL (ref 12.0–16.0)
LYMPHS ABS: 3.4 10*3/uL (ref 1.0–3.6)
Lymphocyte %: 36.4 %
MCH: 30.3 pg (ref 26.0–34.0)
MCHC: 33.5 g/dL (ref 32.0–36.0)
MCV: 90 fL (ref 80–100)
MONO ABS: 0.6 x10 3/mm (ref 0.2–0.9)
Monocyte %: 6.7 %
NEUTROS PCT: 55 %
Neutrophil #: 5.1 10*3/uL (ref 1.4–6.5)
Platelet: 264 10*3/uL (ref 150–440)
RBC: 4.6 10*6/uL (ref 3.80–5.20)
RDW: 13.1 % (ref 11.5–14.5)
WBC: 9.3 10*3/uL (ref 3.6–11.0)

## 2014-06-28 LAB — URINALYSIS, COMPLETE
Bacteria: NONE SEEN
Bilirubin,UR: NEGATIVE
Blood: NEGATIVE
Glucose,UR: NEGATIVE mg/dL (ref 0–75)
Ketone: NEGATIVE
LEUKOCYTE ESTERASE: NEGATIVE
Nitrite: NEGATIVE
PH: 6 (ref 4.5–8.0)
PROTEIN: NEGATIVE
Specific Gravity: 1.009 (ref 1.003–1.030)
Squamous Epithelial: NONE SEEN
WBC UR: 1 /HPF (ref 0–5)

## 2014-06-28 LAB — COMPREHENSIVE METABOLIC PANEL
ALK PHOS: 122 U/L — AB
ALT: 18 U/L
Albumin: 4 g/dL (ref 3.4–5.0)
Anion Gap: 7 (ref 7–16)
BILIRUBIN TOTAL: 0.4 mg/dL (ref 0.2–1.0)
BUN: 14 mg/dL (ref 7–18)
CHLORIDE: 108 mmol/L — AB (ref 98–107)
CREATININE: 1.14 mg/dL (ref 0.60–1.30)
Calcium, Total: 9.2 mg/dL (ref 8.5–10.1)
Co2: 28 mmol/L (ref 21–32)
EGFR (African American): >60
EGFR (Non-African Amer.): 53 — ABNORMAL LOW
Glucose: 85 mg/dL (ref 65–99)
Osmolality: 285 (ref 275–301)
Potassium: 2.8 mmol/L — ABNORMAL LOW (ref 3.5–5.1)
SGOT(AST): 15 U/L (ref 15–37)
Sodium: 143 mmol/L (ref 136–145)
Total Protein: 7.9 g/dL (ref 6.4–8.2)

## 2014-09-20 DIAGNOSIS — K219 Gastro-esophageal reflux disease without esophagitis: Secondary | ICD-10-CM | POA: Diagnosis not present

## 2014-09-20 DIAGNOSIS — F5104 Psychophysiologic insomnia: Secondary | ICD-10-CM | POA: Diagnosis not present

## 2014-09-20 DIAGNOSIS — N183 Chronic kidney disease, stage 3 (moderate): Secondary | ICD-10-CM | POA: Diagnosis not present

## 2014-09-20 DIAGNOSIS — E785 Hyperlipidemia, unspecified: Secondary | ICD-10-CM | POA: Diagnosis not present

## 2014-09-20 DIAGNOSIS — D638 Anemia in other chronic diseases classified elsewhere: Secondary | ICD-10-CM | POA: Diagnosis not present

## 2014-09-20 DIAGNOSIS — G8929 Other chronic pain: Secondary | ICD-10-CM | POA: Diagnosis not present

## 2014-09-25 NOTE — Discharge Summary (Signed)
PATIENT NAME:  Samantha Macias, Samantha Macias MR#:  [id number] DATE OF BIRTH:  [birth year]  DATE OF ADMISSION:  02/09/2014  FINAL DIAGNOSES:  1. Repeated syncope likely due to polypharmacy.  2. Hypokalemia.  3. Acute renal failure on chronic kidney disease.  4. Anxiety.  5. Hypertension.   MEDICATIONS ON DISCHARGE: Include Flonase 50 mcg per inhalation once a day as needed, Voltaren topical 1% gel apply to affected area as needed, Depakote 250 mg at bedtime, Paxil 20 mg at bedtime, tramadol 50 mg 3 times a day as needed, Lyrica 150 mg twice a day, potassium chloride 20 mEq once a day for 3 days, Cozaar 50 mg 1 tablet daily, nicotine patch 1  patch extended release chest wall daily. Stop taking losartan/hydrochlorothiazide 50/12.5. Stop taking Zanaflex 2 mg. Stop taking Xanax 1 mg. Stop taking Ambien 10 mg.   DIET: Low sodium diet, regular consistency.   ACTIVITY: As tolerated.   FOLLOWUP: With Dr. Nehemiah Massed in 1 week; Dr. Ancil Boozer 1-2 weeks.   HOSPITAL COURSE: The patient was admitted with a repeated syncope, admitted as an observation. The patient has been having nausea, vomiting, and diarrhea, was found to be in acute renal failure and given IV fluid hydration.  LABORATORY DATA: Laboratory and radiological data during the hospital course included EKG showed normal sinus rhythm, nonspecific ST-T wave changes. Troponin negative. White blood cell count 8.1, H and H 13.8 and 41.4, platelet count of 222,000. Glucose 97, BUN 28, creatinine 3.09, sodium 136, potassium 3.1, chloride 103, CO2 of 24, calcium 9.6. Liver function tests normal range. Chest x-ray showed no evidence of cardiopulmonary disease. CT scan of the head without contrast was negative. Troponins borderline at 0.08, 0.06. TSH slightly low at 0.154. LDL 92, HDL 37, triglycerides 97. Urinalysis 1+ leukocyte esterase, otherwise negative. MRI of the brain showed no focal acute or reversible finding, mild age-related brain volume loss. Echocardiogram showed  EF of 60%-65%, mild mitral valve regurgitation, mildly increased left ventricular posterior wall thickness. Ultrasound of the carotids showed mild left carotid bifurcation plaque resulting in less than 50% diameter stenosis. Valproic acid level of 32. Creatinine on the 9th came down to 2. Thoracic spine x-ray no acute osseous abnormalities. Free thyroxine normal range at 1.12. Repeat potassium 4.4. Repeat creatinine 1.42. Stress test was negative for ischemia, no significant wall motion abnormality. Ejection fraction 53%, no EKG changes concerning for ischemia, no artifact on this study, this is a normal study.   CONSULTANTS: During the hospital course included Dr. Valora Corporal neurology, Dr. Nehemiah Massed cardiology.   HOSPITAL COURSE PER PROBLEM LIST:  1. For the patient's repeated syncope, this is likely due to polypharmacy. I stopped the patient's Zanaflex, Xanax and Ambien. I told her to be very careful with the tramadol. She can continue the Lyrica, but 150 mg twice a day instead of 300 mg at bedtime, continue the Paxil and Depakote.  2. For her hypokalemia, I believe this is secondary to the nausea, vomiting, diarrhea, and being on hydrochlorothiazide. I stopped the hydrochlorothiazide, replace potassium up at the normal range, can replace potassium for 3 more days.  3. Acute renal failure on chronic kidney disease. I gave vigorous IV fluid hydration and held the losartan/hydrochlorothiazide. Creatinine improved to chronic kidney disease, stage III.  4. Anxiety. Follow up with Dr. Ancil Boozer, may end up needing a psychiatrist as outpatient.  5. Hypertension. Blood pressure borderline upon discharge, can restart Cozaar 50 mg as  outpatient. We did set up home health for this patient,  physical therapy, nurse, and nurse aide. Care manager spoke with the patient about maybe family staying with her for a short period of time. I think the  patient is overmedicated and that is the cause of her issues.   TIME  SPENT ON DISCHARGE: 40 minutes.     ____________________________ Tana Conch. Leslye Peer, MD rjw:bu D: 02/11/2014 15:04:46 ET T: 02/11/2014 16:18:22 ET JOB#: [id number]  cc: Tana Conch. Leslye Peer, MD, <Dictator> Marisue Brooklyn MD ELECTRONICALLY SIGNED 02/16/2014 12:27

## 2014-09-25 NOTE — Consult Note (Signed)
PATIENT NAME:  Samantha Macias, Samantha Macias MR#:  [id number] DATE OF BIRTH:  Oct 17, 1960  DATE OF CONSULTATION:  02/10/2014  REFERRING PHYSICIAN:  Tana Conch. Leslye Peer, MD CONSULTING PHYSICIAN:  Corey Skains, MD  REASON FOR CONSULTATION: Syncope.   CHIEF COMPLAINT: " I passed out."   HISTORY OF PRESENT ILLNESS: This is a 54 year old female with stage 3 chronic kidney disease with a creatinine of 3 who has had PTSD and has had new onset of dizziness and syncope off and on over the last several weeks. She has passed out multiple times without warning and has had some injury, but no evidence of bone breaking. The patient did have other episodes where she had chest pain,  had radiation into her back or left arm and left neck pain. At that time, the patient  has been seen in the Emergency Room with an EKG showing normal sinus rhythm with anterior precordial T-wave inversion with a troponin of 0.08, currently with an echocardiogram showing normal LV systolic function with ejection fraction of 65% and no evidence of significant valvular heart disease. The patient now has much improvements of symptoms with telemetry showing normal sinus rhythm and no further syncope. The neurologist had seen her and has suggested other issues.  REVIEW OF SYSTEMS: The remainder review of systems negative for vision change, ringing in the ears, hearing loss, cough, congestion, heartburn, nausea, vomiting, diarrhea, bloody stools, stomach pain, extremity pain, leg weakness, cramping of the buttocks, known blood clots, headaches, nosebleeds, congestion, trouble swallowing, frequent urination, urination at night, muscle weakness, numbness, anxiety, depression, skin lesions or skin rashes.   PAST MEDICAL HISTORY:  1.  Chronic renal failure stage 3.  2.  PTSD.   FAMILY HISTORY: Multiple family members with early onset of cardiovascular disease and hypertension.   SOCIAL HISTORY: Currently denies alcohol or tobacco use.   ALLERGIES: AS  LISTED.   MEDICATIONS: As listed.   PHYSICAL EXAMINATION:  VITAL SIGNS: Blood pressure is 120/65 bilaterally. Heart rate is 60 upright, reclining, and regular.  GENERAL: She is a well-appearing female in no acute distress.  HEENT: No icterus, thyromegaly, ulcers, hemorrhage or xanthelasma.  CARDIOVASCULAR: Regular rate and rhythm. Normal S1, S2 without murmur, gallop or rub. PMI is normal size and placement. Carotid upstroke normal without bruit. Jugular venous pressure is normal.  LUNGS: Clear to auscultation with normal respirations.  ABDOMEN: Soft, nontender, without hepatosplenomegaly or masses. Abdominal aorta is normal size without bruit.  EXTREMITIES: Show 2+ bilateral pulses in dorsal, pedal, radial and femoral arteries without lower extremity edema, cyanosis, clubbing or ulcers.  NEUROLOGIC: She is oriented to time, place, and person, with normal mood and affect.   ASSESSMENT: This is a 54 year old female with chronic kidney disease, normal left ventricular systolic function by echocardiogram with acute episodes of chest discomfort, T-wave inversions, minimal elevation in troponin consistent with possible demand ischemia and syncope of unknown etiology, needing further treatment.   RECOMMENDATIONS:  1.  Continue telemetry with telemetry unit nursing following for evidence of rhythm disturbances.  2.  Stress test with Lexiscan infusion for assessment of myocardial ischemia as a cause of syncope and/or EKG changes.  3.  Continue neurologic workup as other causes of syncope.  4.  Begin ambulation and follow for other symptoms.  5.  Further treatment options after above.    ____________________________ Corey Skains, MD bjk:TT D: 02/10/2014 21:10:35 ET T: 02/10/2014 22:23:50 ET JOB#: [id number]  cc: Corey Skains, MD, <Dictator> Corey Skains MD ELECTRONICALLY SIGNED  03/03/2014 15:46 

## 2014-09-25 NOTE — Consult Note (Signed)
Referring Physician:  Monica Becton :   Primary Care Physician:  Monica Becton [facility], [address], [place], [place] [address], [place] [phone]  Reason for Consult: Admit Date: 09-Feb-2014  Chief Complaint: passing out  Reason for Consult: seizure   History of Present Illness: History of Present Illness:   54 yo RHD F presents to [facility] secondary to multiple episodes of passing out.  Pt reports them at all times with injuries x 3 and incontinence x 2.  Pt has never had tongue biting.  Questionable post-ictal confusion per pt.  Pt also states that sometimes she shakes per other witnesses but they are not present for interview.  Pt reports 3-4 episodes daily.  Pt states that these can occur at any time but are particular worse when she stands up too quick.  Prior to episodes, she has a shaky feeling.  No numbness/tingling, speech problems or weakness reported with these episodes.  ROS:  General fatigue   HEENT no complaints   Lungs no complaints   Cardiac no complaints   GI no complaints   GU no complaints   Musculoskeletal no complaints   Extremities no complaints   Skin no complaints   Neuro no complaints   Endocrine no complaints   Psych anxiety   Past Medical/Surgical Hx:  kidney stones:   seizures:   stitches in forehead-16 in all due to fall:   Arteriosclerosis:   TIA - Transient Ischemic Attack:   Rheumatoid Arthritis:   Lupus:   Hypertension:   Fibromyalgia:   Anxiety:   Depression:   broken ankle left:   broken ribs times 3 on left side:   Wrist Surgery - Left: broken  Past Medical/ Surgical Hx:  Past Medical History reviewed by me as above   Past Surgical History reviewed by me as above   Home Medications: Medication Instructions Last Modified Date/Time  hydrochlorothiazide-losartan 12.5 mg-50 mg oral tablet 1 tab(s) orally once a day 08-Sep-15 23:06  tiZANidine 2 mg oral tablet 1 tab(s) orally once a day (at  bedtime) 08-Sep-15 23:06  Lyrica 150 mg oral capsule 2 cap(s) orally once a day (at bedtime) 08-Sep-15 23:06  Flonase 50 mcg/inh nasal spray 1 spray(s) nasal once a day, As Needed 08-Sep-15 23:06  Voltaren Topical 1% topical gel Apply topically to affected area , As Needed 08-Sep-15 23:06  ALPRAZolam 1 mg oral tablet 1 tab(s) orally once a day, As Needed 08-Sep-15 23:06  divalproex sodium 250 mg oral delayed release tablet 1 tab(s) orally once a day (at bedtime) 08-Sep-15 23:06  zolpidem 10 mg oral tablet 1 tab(s) orally once a day (at bedtime), As Needed 08-Sep-15 23:06  PARoxetine 20 mg oral tablet 1 tab(s) orally once a day (at bedtime) 08-Sep-15 23:06  traMADol 50 mg oral tablet 1 tab(s) orally 3 times a day, As Needed 08-Sep-15 23:06   Allergies:  Codeine: Unknown  Tetanus /Diphtheria Toxoid: Unknown  Allergies:  Allergies as above   Social/Family History: Employment Status: disabled  Lives With: spouse  Living Arrangements: apartment  Social History: no tob, no EtOH, no illicits  Family History: no strokes, no seizures   Vital Signs: **Vital Signs.:   09-Sep-15 11:44  Vital Signs Type Routine  Temperature Temperature (F) 97.5  Celsius 36.3  Temperature Source oral  Pulse Pulse 78  Respirations Respirations 18  Systolic BP Systolic BP 169  Diastolic BP (mmHg) Diastolic BP (mmHg) 84  Mean BP 98  Pulse Ox % Pulse Ox % 95  Pulse Ox Activity Level  At rest  Oxygen Delivery Room Air/ 21 %    61:60  Systolic BP Systolic BP 737  Diastolic BP (mmHg) Diastolic BP (mmHg) 67  Pulse Lying Pulse Lying 79  Systolic BP Systolic BP 106  Diastolic BP (mmHg) Diastolic BP (mmHg) 64  Pulse Pulse Sitting 80  Systolic BP Systolic BP 269  Diastolic BP (mmHg) Diastolic BP (mmHg) 70  Pulse Standing Pulse Standing 75   Physical Exam: General: overweight, mildly anxious  HEENT: normocephalic, sclera nonicteric, oropharynx clear  Neck: supple, no JVD, no bruits  Chest: CTA B, no  wheezing, good movement  Cardiac: RRR, no murmurs, no edema, 2+ pulses  Extremities: no C/C/E, FROM   Neurologic Exam: Mental Status: alert and oriented x 3, normal speech and language, follows complex commands  Cranial Nerves: PERRLA, EOMI, nl VF, face symmetric, tongue midline, shoulder shrug equal  Motor Exam: 5/5 B normal, tone, no tremor  Deep Tendon Reflexes: 2+/4 B, plantars downgoing B, no Hoffman  Sensory Exam: pinprick, temperature, and vibration intact B  Coordination: FTN and HTS WNL, nl RAM   Lab Results: Thyroid:  09-Sep-15 02:18   Thyroid Stimulating Hormone  0.154 (0.45-4.50 (IU = International Unit)  ----------------------- Pregnant patients have  different reference  ranges for TSH:  - - - - - - - - - -  Pregnant, first trimetser:  0.36 - 2.50 uIU/mL)  LabObservation:  09-Sep-15 10:15   OBSERVATION Reason for Test  Hepatic:  08-Sep-15 18:11   Bilirubin, Total 0.3  Alkaline Phosphatase 90 (46-116 NOTE: New Reference Range 12/22/13)  SGPT (ALT)  12 (14-63 NOTE: New Reference Range 12/22/13)  SGOT (AST)  8  Total Protein, Serum 7.7  Albumin, Serum 3.8  TDMs:  09-Sep-15 11:29   Valproic Acid, Serum  32 (50-100 POTENTIALLY TOXIC:  > 200 mcg/mL)  Routine Chem:  09-Sep-15 02:18   Result Comment TROPONIN - RESULTS VERIFIED BY REPEAT TESTING.  - PREV. C/ 02-09-14 _0  BY AJO.Marland KitchenAJO  Result(s) reported on 10 Feb 2014 at 03:19AM.  Cholesterol, Serum 148  Triglycerides, Serum 97  HDL (INHOUSE)  37  VLDL Cholesterol Calculated 19  LDL Cholesterol Calculated 92 (Result(s) reported on 10 Feb 2014 at 03:18AM.)    11:29   Glucose, Serum  128  BUN  26  Creatinine (comp)  2.00  Sodium, Serum 142  Potassium, Serum  2.9  Chloride, Serum 106  CO2, Serum 28  Calcium (Total), Serum  8.1  Anion Gap 8  Osmolality (calc) 290  eGFR (African American)  33  eGFR (Non-African American)  28 (eGFR values <75m/min/1.73 m2 may be an indication of chronic kidney disease  (CKD). Calculated eGFR is useful in patients with stable renal function. The eGFR calculation will not be reliable in acutely ill patients when serum creatinine is changing rapidly. It is not useful in  patients on dialysis. The eGFR calculation may not be applicable to patients at the low and high extremes of body sizes, pregnant women, and vegetarians.)  Cardiac:  09-Sep-15 02:18   Troponin I  0.06 (0.00-0.05 0.05 ng/mL or less: NEGATIVE  Repeat testing in 3-6 hrs  if clinically indicated. >0.05 ng/mL: POTENTIAL  MYOCARDIAL INJURY. Repeat  testing in 3-6 hrs if  clinically indicated. NOTE: An increase or decrease  of 30% or more on serial  testing suggests a  clinically important change)  CK, Total 47 (26-192 NOTE: NEW REFERENCE RANGE  07/06/2013)  CPK-MB, Serum 0.6 (Result(s) reported on 10 Feb 2014 at 03:15AM.)  Routine UA:  09-Sep-15 05:34   Color (UA) Amber  Clarity (UA) Turbid  Glucose (UA) Negative  Bilirubin (UA) Negative  Ketones (UA) Negative  Specific Gravity (UA) 1.014  Blood (UA) Negative  pH (UA) 5.0  Protein (UA) 30 mg/dL  Nitrite (UA) Negative  Leukocyte Esterase (UA) 1+ (Result(s) reported on 10 Feb 2014 at 06:31AM.)  RBC (UA) 6 /HPF  WBC (UA) 34 /HPF  Bacteria (UA) NONE SEEN  Epithelial Cells (UA) 59 /HPF  Mucous (UA) PRESENT  Hyaline Cast (UA) 47 /LPF (Result(s) reported on 10 Feb 2014 at 06:31AM.)  Routine Hem:  08-Sep-15 18:11   WBC (CBC) 8.1  RBC (CBC) 4.40  Hemoglobin (CBC) 13.8  Hematocrit (CBC) 41.4  Platelet Count (CBC) 222 (Result(s) reported on 09 Feb 2014 at 06:44PM.)  MCV 94  MCH 31.3  MCHC 33.3  RDW 13.0   Radiology Results: [place]:    09-Sep-15 09:54, US Carotid Doppler Bilateral  US Carotid Doppler Bilateral   REASON FOR EXAM:    Syncope  COMMENTS:       PROCEDURE: [place]  - US CAROTID DOPPLER BILATERAL  - Feb 10 2014  9:54AM     CLINICAL DATA:  Syncope. History of stroke, visual disturbance,  tobacco  abuse.    EXAM:  BILATERAL CAROTID DUPLEX ULTRASOUND    TECHNIQUE:  Pearline Cables scale imaging, color Doppler and duplex ultrasound was  performed of bilateral carotid and vertebral arteries in the neck.  COMPARISON:  01/08/2000 by report only    REVIEW OF SYSTEMS:  Quantification of carotid stenosis is based on velocity parameters  that correlate the residual internal carotid diameter with  NASCET-based stenosis levels, using the diameter of the distal  internal carotid lumen as the denominator for stenosis measurement.    The following velocity measurements were obtained:    PEAK SYSTOLIC/END DIASTOLIC    RIGHT    ICA:                     102/42cm/sec  CCA:                     88/91QX/IHW    SYSTOLIC ICA/CCA RATIO:  3.88    DIASTOLIC ICA/CCA RATIO: 8.28    ECA:                     79cm/sec    LEFT    ICA:  96/52 Cm/sec    CCA:                     00/34JZ/PHX    SYSTOLIC ICA/CCA RATIO:  1.1  DIASTOLIC ICA/CCA RATIO: 5.05    ECA:                     97cm/sec    FINDINGS:  RIGHT CAROTID ARTERY: Mild intimal thickening. No focal plaque  accumulation or stenosis.    RIGHT VERTEBRAL ARTERY:  Normal flow direction and waveform.    LEFT CAROTID ARTERY: Mild noncalcified plaque in the carotid bulb  without significant stenosis. Normal waveforms and color Doppler  signal.    LEFT VERTEBRAL ARTERY: Normal flowdirection and waveform.   IMPRESSION:  1. 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 The exam does not exclude plaque ulceration or  embolization. Continued surveillance recommended.      Electronically Signed    By: Delories Heinz.D.  On: 02/10/2014 10:16         Verified By: Kandis Cocking, M.D.,  MRI:    09-Sep-15 08:55, MRI Brain Without Contrast  MRI Brain Without Contrast   REASON FOR EXAM:    Syncope  COMMENTS:       PROCEDURE: MR  - MR BRAIN WO CONTRAST  - Feb 10 2014  8:55AM     CLINICAL DATA:   Syncope, recurring over the last 3 years. Multiple  falls. Possible seizure. Symptoms are worsening.    EXAM:  MRI HEAD WITHOUT CONTRAST    TECHNIQUE:  Multiplanar, multiecho pulse sequences of the brain and surrounding  structures were obtained without intravenous contrast.  COMPARISON:  Head CT 02/09/2014.  MRI report 01/10/2000.    FINDINGS:  There is mild age related volume loss. There is no evidence of old  or acute focal infarction, mass lesion, hemorrhage, hydrocephalus or  extra-axial collection. No pituitary mass. Sinuses, middle ears and  mastoids are clear. No skull or skullbase lesion. Major vessels at  thebase the brain show flow.     IMPRESSION:  No focal, acute or reversible finding. Mild age related brain volume  loss.      Electronically Signed    By: Nelson Chimes M.D.    On: 02/10/2014 09:08         Verified By: Jules Schick, M.D.,  CT:    08-Sep-15 19:03, CT Head Without Contrast  CT Head Without Contrast   REASON FOR EXAM:    syncope, fell, hit head/contusion  COMMENTS:       PROCEDURE: CT  - CT HEAD WITHOUT CONTRAST  - Feb 09 2014  7:03PM     CLINICAL DATA:  Nausea, vomiting, weakness and fainting. History of  lupus and renal failure.    EXAM:  CT HEAD WITHOUT CONTRAST    TECHNIQUE:  Contiguous axial images were obtained from the base of the skull  through the vertex without intravenous contrast.  COMPARISON:  None.    FINDINGS:  Gray-white differentiation is maintained. No CT evidence of acute  large territory infarct. No intraparenchymal or extra-axial mass or  hemorrhage. Normal size and configuration of the ventricles and  basilar cisterns. No midline shift. There is under pneumatization of  the left frontal sinus. The remaining paranasal sinuses and mastoid  air cells are normally aerated. Regional soft tissues appear normal.  No displaced calvarial fracture.     IMPRESSION:  Negative noncontrast head CT.    Electronically  Signed    By: Sandi Mariscal M.D.    On: 02/09/2014 19:08         Verified By: Aileen Fass, M.D.,   Radiology Impression: Radiology Impression: MRI of brain personally reviewed by me and shows normal brain   Impression/Recommendations: Recommendations:   prior notes reviewed by me reviewed by me   Probable syncope-  the number of episodes daily goes away from seizures but there are some questionable parts of the history concerning for seizure as well to include prior hx of seizures, incontinence and confusion afterward EEG in am d/c Zanaflex would hold Xanax at home change Lyrica to 118m BID check orthostatic vitals will follow briefly  Electronic Signatures: SJamison Neighbor(MD)  (Signed 09-Sep-15 15:07)  Authored: REFERRING PHYSICIAN, Primary Care Physician, Consult, History of Present Illness, Review of Systems, PAST MEDICAL/SURGICAL HISTORY, HOME MEDICATIONS, ALLERGIES, Social/Family History, NURSING VITAL SIGNS, Physical Exam-, LAB RESULTS, RADIOLOGY RESULTS, Recommendations   Last Updated:  09-Sep-15 15:07 by Jamison Neighbor (MD)

## 2014-09-25 NOTE — H&P (Signed)
PATIENT NAME:  Samantha Macias, RAJAGOPALAN MR#:  [id number] DATE OF BIRTH:  1960/08/19  PRIMARY CARE PHYSICIAN:     REFERRING PHYSICIAN: Ahmed Prima, MD  CHIEF COMPLAINT: Passed out.  HISTORY OF PRESENT ILLNESS: Samantha Macias, a 54 year old female, with history of lupus, not on any medications, PTSD, OCD, who comes to the Emergency Department after having episodes of syncope. The patient states for the last 3 days has been experiencing some nausea, vomiting, and diarrhea. When patient wakes up in the morning usually feels well, stands up, upon walking some distance loses consciousness. The patient states of unknown duration. The patient states usually wakes up at a different place. The patient states as a child had some history of seizures. However, had extensive workup done including Holter monitor and was found to have some cardiac cause. The patient states experiences some chest pain with these episodes. Denies having any palpitations. The patient states has been drinking plenty of fluids. Denies having any fever; however, experienced some subjective chills. The patient also states that she has been extremely wobbly feeling the doors and things while walking.  Also states that she has some decreased vision. Workup in the Emergency Department with CBC, CMP are completely within normal limits. Chest x-ray, one view, portable: No acute cardiopulmonary disease. CT head without contrast: No acute intracranial abnormality. EKG, 12-lead: Normal sinus rhythm with no ST-T wave abnormalities. Initial set of cardiac enzymes are negative.   PAST MEDICAL HISTORY: 1.  History of nephrolithiasis.  2.  Questionable history of seizures, not on any medications. 3.  Lupus, not on any medications at present. 4.  Hypertension.  5.  Fibromyalgia.  6.  Anxiety, depression.  7.  Carpal tunnel syndrome.  8.  PTSD.  9.  Obsessive-compulsive disorder.  10.  Chronic kidney disease.   PAST SURGICAL HISTORY: 1.  Tubal ligation.   2.  Carpal tunnel repair.  3.  History of rib fractures.   ALLERGIES: CODEINE AND TETANUS.   HOME MEDICATIONS: 1.  Ambien 10 mg once a day.  2.  Voltaren apply topically as needed.  3.  Tramadol 50 mg 3 times a day.  4.  Tizanidine 2 mg as needed.  5.  Paroxetine 20 mg daily. 6.  Lyrica 150 mg 2 tablets once a day. 7.  Losartan, hydrochlorothiazide 12.5/50 once a day. 8.  Flonase 50 mcg once a day.  9.  Depakote 250 mg once a day.  10. Clonazepam 0.5 mg once a day.  11.  Alprazolam 1 mg once a day as needed.   SOCIAL HISTORY: Continues to smoke 3/4 pack a day. Denies drinking alcohol or using illicit drugs. Lives by herself. Disabled.   FAMILY HISTORY: Heart problems.   REVIEW OF SYSTEMS: CONSTITUTIONAL: Experiences generalized weakness.  EYES: No change in vision.  ENT: No change in hearing.  RESPIRATORY: No cough, shortness of breath.  CARDIOVASCULAR: Experiences some chest pain.  GASTROINTESTINAL: Has nausea, vomiting, and some diarrhea. GENITOURINARY: No dysuria or hematuria. HEMATOLOGIC:  No easy bruising or bleeding.  SKIN: No rash or lesions.  MUSCULOSKELETAL: Has arthritis. NEUROLOGIC: Experiences weakness. Has been wobbly.   PHYSICAL EXAMINATION: GENERAL: She is a well-built, well-nourished, age-appropriate female lying down in the bed, not in distress.  VITAL SIGNS: Temperature 97.8, pulse 78, blood pressure 143/85, respiratory rate of 20, oxygen saturation is 96% on room air.  HEENT: Head normocephalic, atraumatic. There is no scleral icterus. Conjunctivae normal. Pupils equal, round, and react to light. Mucous membranes moist. No pharyngeal  erythema.  NECK: Supple. No lymphadenopathy. No JVD. No carotid bruit.  CHEST: Has no focal tenderness.  LUNGS: Bilaterally clear to auscultation.  HEART: S1, S2 regular. No murmurs are heard.  ABDOMEN: Bowel sounds present. Soft, nontender, nondistended. No hepatosplenomegaly.  EXTREMITIES: No pedal edema. Pulses 2+.   SKIN: No rash or lesions.  MUSCULOSKELETAL: Good range of motion in all the extremities.  NEUROLOGIC: The patient is alert, oriented to place, person, and time. Cranial nerves II through XII intact. Motor 5/5 in upper and lower extremities.   LABORATORY DATA: CBC: WBC of 8.1, hemoglobin 13.8, platelet count of 222,000.  CMP: BUN 28, creatinine of 3.09. We do not have patient's baseline. CK-MB of less than 5.  Troponin less than 0.02.   IMAGING:  Chest x-ray, one view, portable: No acute cardiopulmonary disease. CT head without contrast: No acute intracranial abnormality.   EKG, 12-lead: Normal sinus rhythm with nonspecific ST-T wave abnormalities.   ASSESSMENT AND PLAN: Samantha Macias, a 54 year old female, comes to the Emergency Department after having a few episodes of syncope.  1.  Syncope. Multiple risk factors.  The patient has been having nausea, vomiting, and diarrhea and on top of that the patient is on hydrochlorothiazide. Obtain orthostatic blood pressure. Continue with intravenous fluids. Also admit the patient to a monitored bed. 2.  Concerning about the patient's previous history of seizures, we will obtain MRA of the brain. We will obtain EEG also as the patient was stating that she had some cardiac issues. Will obtain echocardiogram.  3.  Concerning about the patient's wobbly state, we will obtain MRA of the brain with and without contrast. Will also cycle cardiac enzymes x 3, considering the patient's history of tobacco use and hypertension.  4. Nausea and vomiting.  Will continue to watch, continue with intravenous fluids and provide antinausea medications.  5.  Acute on chronic kidney disease. We do not have the patient's baseline. We will continue with intravenous fluids and follow up. Will hold the losartan, hydrochlorothiazide.  6.  Hypertension: Will hold the losartan, hydrochlorothiazide.  7.  Anxiety, depression. Continue the paroxetine.   8.  Keep the patient on deep vein  thrombosis prophylaxis with Lovenox.   TIME SPENT: Fifty minutes.   ____________________________ Monica Becton, MD pv:LT D: 02/09/2014 21:47:00 ET T: 02/09/2014 22:17:48 ET JOB#: [id number]  cc: Monica Becton, MD, <Dictator> Monica Becton MD ELECTRONICALLY SIGNED 02/17/2014 21:34

## 2014-09-29 DIAGNOSIS — B353 Tinea pedis: Secondary | ICD-10-CM | POA: Diagnosis not present

## 2014-09-29 DIAGNOSIS — M7062 Trochanteric bursitis, left hip: Secondary | ICD-10-CM | POA: Diagnosis not present

## 2014-11-02 DIAGNOSIS — M545 Low back pain: Secondary | ICD-10-CM | POA: Diagnosis not present

## 2014-11-02 DIAGNOSIS — G8929 Other chronic pain: Secondary | ICD-10-CM | POA: Diagnosis not present

## 2014-11-02 DIAGNOSIS — R8290 Unspecified abnormal findings in urine: Secondary | ICD-10-CM | POA: Diagnosis not present

## 2014-11-02 DIAGNOSIS — N183 Chronic kidney disease, stage 3 (moderate): Secondary | ICD-10-CM | POA: Diagnosis not present

## 2014-11-02 DIAGNOSIS — I10 Essential (primary) hypertension: Secondary | ICD-10-CM | POA: Diagnosis not present

## 2014-11-11 ENCOUNTER — Other Ambulatory Visit: Payer: Self-pay | Admitting: Family Medicine

## 2014-12-01 ENCOUNTER — Encounter: Payer: Self-pay | Admitting: Family Medicine

## 2014-12-01 DIAGNOSIS — IMO0002 Reserved for concepts with insufficient information to code with codable children: Secondary | ICD-10-CM | POA: Insufficient documentation

## 2014-12-01 DIAGNOSIS — E785 Hyperlipidemia, unspecified: Secondary | ICD-10-CM | POA: Insufficient documentation

## 2014-12-01 DIAGNOSIS — N281 Cyst of kidney, acquired: Secondary | ICD-10-CM | POA: Insufficient documentation

## 2014-12-01 DIAGNOSIS — M7061 Trochanteric bursitis, right hip: Secondary | ICD-10-CM | POA: Insufficient documentation

## 2014-12-01 DIAGNOSIS — K5900 Constipation, unspecified: Secondary | ICD-10-CM | POA: Insufficient documentation

## 2014-12-01 DIAGNOSIS — N183 Chronic kidney disease, stage 3 unspecified: Secondary | ICD-10-CM | POA: Insufficient documentation

## 2014-12-01 DIAGNOSIS — N200 Calculus of kidney: Secondary | ICD-10-CM | POA: Insufficient documentation

## 2014-12-01 DIAGNOSIS — F411 Generalized anxiety disorder: Secondary | ICD-10-CM | POA: Insufficient documentation

## 2014-12-01 DIAGNOSIS — R269 Unspecified abnormalities of gait and mobility: Secondary | ICD-10-CM | POA: Insufficient documentation

## 2014-12-01 DIAGNOSIS — J309 Allergic rhinitis, unspecified: Secondary | ICD-10-CM | POA: Insufficient documentation

## 2014-12-01 DIAGNOSIS — D638 Anemia in other chronic diseases classified elsewhere: Secondary | ICD-10-CM | POA: Insufficient documentation

## 2014-12-01 DIAGNOSIS — F3131 Bipolar disorder, current episode depressed, mild: Secondary | ICD-10-CM | POA: Insufficient documentation

## 2014-12-01 DIAGNOSIS — M5416 Radiculopathy, lumbar region: Secondary | ICD-10-CM | POA: Insufficient documentation

## 2014-12-01 DIAGNOSIS — F4310 Post-traumatic stress disorder, unspecified: Secondary | ICD-10-CM | POA: Insufficient documentation

## 2014-12-01 DIAGNOSIS — Z86010 Personal history of colonic polyps: Secondary | ICD-10-CM | POA: Insufficient documentation

## 2014-12-01 DIAGNOSIS — G894 Chronic pain syndrome: Secondary | ICD-10-CM | POA: Insufficient documentation

## 2014-12-01 DIAGNOSIS — M797 Fibromyalgia: Secondary | ICD-10-CM | POA: Insufficient documentation

## 2014-12-01 DIAGNOSIS — F429 Obsessive-compulsive disorder, unspecified: Secondary | ICD-10-CM | POA: Insufficient documentation

## 2014-12-01 DIAGNOSIS — G4700 Insomnia, unspecified: Secondary | ICD-10-CM | POA: Insufficient documentation

## 2014-12-01 DIAGNOSIS — Z87898 Personal history of other specified conditions: Secondary | ICD-10-CM | POA: Insufficient documentation

## 2014-12-01 DIAGNOSIS — M47812 Spondylosis without myelopathy or radiculopathy, cervical region: Secondary | ICD-10-CM | POA: Insufficient documentation

## 2014-12-01 DIAGNOSIS — G5603 Carpal tunnel syndrome, bilateral upper limbs: Secondary | ICD-10-CM | POA: Insufficient documentation

## 2014-12-01 DIAGNOSIS — K219 Gastro-esophageal reflux disease without esophagitis: Secondary | ICD-10-CM | POA: Insufficient documentation

## 2014-12-01 DIAGNOSIS — Z8673 Personal history of transient ischemic attack (TIA), and cerebral infarction without residual deficits: Secondary | ICD-10-CM | POA: Insufficient documentation

## 2014-12-01 DIAGNOSIS — R296 Repeated falls: Secondary | ICD-10-CM | POA: Insufficient documentation

## 2014-12-01 DIAGNOSIS — I6522 Occlusion and stenosis of left carotid artery: Secondary | ICD-10-CM | POA: Insufficient documentation

## 2014-12-01 DIAGNOSIS — E663 Overweight: Secondary | ICD-10-CM | POA: Insufficient documentation

## 2014-12-01 DIAGNOSIS — M7062 Trochanteric bursitis, left hip: Secondary | ICD-10-CM

## 2014-12-02 ENCOUNTER — Other Ambulatory Visit: Payer: Self-pay | Admitting: Family Medicine

## 2014-12-02 DIAGNOSIS — N183 Chronic kidney disease, stage 3 (moderate): Secondary | ICD-10-CM | POA: Diagnosis not present

## 2014-12-02 DIAGNOSIS — I10 Essential (primary) hypertension: Secondary | ICD-10-CM | POA: Diagnosis not present

## 2014-12-02 DIAGNOSIS — E780 Pure hypercholesterolemia: Secondary | ICD-10-CM | POA: Diagnosis not present

## 2014-12-02 DIAGNOSIS — N2581 Secondary hyperparathyroidism of renal origin: Secondary | ICD-10-CM | POA: Diagnosis not present

## 2014-12-02 NOTE — Telephone Encounter (Signed)
Dr. Ancil Boozer states patient has a follow up with her tomorrow on 12/03/14 and will fill all her medications then.

## 2014-12-03 ENCOUNTER — Encounter: Payer: Self-pay | Admitting: Family Medicine

## 2014-12-03 ENCOUNTER — Ambulatory Visit: Payer: Medicare Other | Admitting: Family Medicine

## 2014-12-03 DIAGNOSIS — N2581 Secondary hyperparathyroidism of renal origin: Secondary | ICD-10-CM | POA: Insufficient documentation

## 2014-12-10 LAB — HM MAMMOGRAPHY

## 2014-12-16 ENCOUNTER — Telehealth: Payer: Self-pay | Admitting: Family Medicine

## 2014-12-16 NOTE — Telephone Encounter (Signed)
We have not seen anything as of yet.  But we do call pts with results when we receive them.

## 2014-12-16 NOTE — Telephone Encounter (Signed)
Pt is in the [place] and would like to know if you have received her mammogram report from [place], her bloodwork and pap?

## 2014-12-20 ENCOUNTER — Encounter: Payer: Self-pay | Admitting: Family Medicine

## 2014-12-20 ENCOUNTER — Ambulatory Visit (INDEPENDENT_AMBULATORY_CARE_PROVIDER_SITE_OTHER): Payer: Medicare Other | Admitting: Family Medicine

## 2014-12-20 VITALS — BP 126/78 | HR 99 | Temp 97.7°F | Resp 16 | Ht 67.0 in | Wt 183.0 lb

## 2014-12-20 DIAGNOSIS — N2581 Secondary hyperparathyroidism of renal origin: Secondary | ICD-10-CM | POA: Diagnosis not present

## 2014-12-20 DIAGNOSIS — F419 Anxiety disorder, unspecified: Secondary | ICD-10-CM

## 2014-12-20 DIAGNOSIS — N183 Chronic kidney disease, stage 3 unspecified: Secondary | ICD-10-CM

## 2014-12-20 DIAGNOSIS — M549 Dorsalgia, unspecified: Secondary | ICD-10-CM

## 2014-12-20 DIAGNOSIS — K219 Gastro-esophageal reflux disease without esophagitis: Secondary | ICD-10-CM | POA: Diagnosis not present

## 2014-12-20 DIAGNOSIS — G4700 Insomnia, unspecified: Secondary | ICD-10-CM

## 2014-12-20 DIAGNOSIS — N3944 Nocturnal enuresis: Secondary | ICD-10-CM | POA: Diagnosis not present

## 2014-12-20 DIAGNOSIS — F310 Bipolar disorder, current episode hypomanic: Secondary | ICD-10-CM

## 2014-12-20 DIAGNOSIS — N3946 Mixed incontinence: Secondary | ICD-10-CM

## 2014-12-20 DIAGNOSIS — E785 Hyperlipidemia, unspecified: Secondary | ICD-10-CM

## 2014-12-20 DIAGNOSIS — M797 Fibromyalgia: Secondary | ICD-10-CM

## 2014-12-20 DIAGNOSIS — D638 Anemia in other chronic diseases classified elsewhere: Secondary | ICD-10-CM | POA: Diagnosis not present

## 2014-12-20 DIAGNOSIS — I10 Essential (primary) hypertension: Secondary | ICD-10-CM

## 2014-12-20 DIAGNOSIS — R269 Unspecified abnormalities of gait and mobility: Secondary | ICD-10-CM | POA: Diagnosis not present

## 2014-12-20 DIAGNOSIS — G8929 Other chronic pain: Secondary | ICD-10-CM

## 2014-12-20 LAB — POCT URINALYSIS DIPSTICK
BILIRUBIN UA: NEGATIVE
Glucose, UA: NEGATIVE
KETONES UA: NEGATIVE
Leukocytes, UA: NEGATIVE
NITRITE UA: NEGATIVE
PROTEIN UA: NEGATIVE
RBC UA: NEGATIVE
Spec Grav, UA: 1.015
UROBILINOGEN UA: 0.2
pH, UA: 5

## 2014-12-20 MED ORDER — PREGABALIN 300 MG PO CAPS: 300.0000 mg | ORAL_CAPSULE | Freq: Three times a day (TID) | ORAL | Status: DC

## 2014-12-20 MED ORDER — ATORVASTATIN CALCIUM 40 MG PO TABS: 40.0000 mg | ORAL_TABLET | Freq: Every day | ORAL | Status: DC

## 2014-12-20 MED ORDER — TRAMADOL HCL 50 MG PO TABS: 50.0000 mg | ORAL_TABLET | Freq: Three times a day (TID) | ORAL | Status: DC

## 2014-12-20 MED ORDER — RANITIDINE HCL 150 MG PO TABS: 150.0000 mg | ORAL_TABLET | Freq: Every day | ORAL | Status: DC | PRN

## 2014-12-20 MED ORDER — DICLOFENAC SODIUM 1 % TD GEL: 4.0000 g | Freq: Four times a day (QID) | TRANSDERMAL | Status: DC | PRN

## 2014-12-20 MED ORDER — ALPRAZOLAM 0.5 MG PO TABS: 0.5000 mg | ORAL_TABLET | Freq: Every evening | ORAL | Status: DC | PRN

## 2014-12-20 MED ORDER — CIPROFLOXACIN HCL 250 MG PO TABS: 250.0000 mg | ORAL_TABLET | Freq: Two times a day (BID) | ORAL | Status: DC

## 2014-12-20 MED ORDER — HYDRALAZINE HCL 25 MG PO TABS: 25.0000 mg | ORAL_TABLET | Freq: Three times a day (TID) | ORAL | Status: DC

## 2014-12-20 NOTE — Progress Notes (Signed)
Name: Samantha Macias   MRN: [id number]    DOB: 04/24/1961   Date:12/20/2014       Progress Note  Subjective  Chief Complaint  Chief Complaint  Patient presents with  . Hypertension    elevated du to pain  . Insomnia    worsening states avg 2hrs  . Anxiety    worsening she states son is home and having trouble with him  . Urinary Incontinence    onset 2 weeks, wetting the bed (pt denies any frequency or burning pain) lower abdominal pain  . Pain    fibromyalgia pain all over    HPI  HTN: she has been taking Hydralazine as prescribed and denies side effects of medication.  BP is at goal,   CKI: seeing Nephrologist and had labs done recently, I looked on care everywhere but did not find results.  Insomnia: she was on Ambien but is no on Rozerem because of insurance coverage, she has been sleeping well except for pain that wakes her up during the night when she moves around  Anxiety: she continues to take Alprazolam, she is given prescription for once daily but she has been taking more than that at times, explained that it must last 30 days  Urinary incontinence: she has noticed urinary incontinence, she states she has to rush to void, also at times has some dribbling when coughing or sneezing and had a couple episodes of nocturnal enuresis ( can't get up quick enough to void), she states this am she also has some supra pubic discomfort  FMS: she has generalized pain, all over her body, she was getting an aid to her home for 15 hours week, but stopped because she did not want to give them her social security number. Her pain is 9/10. She has joint stiffness, muscle aches, back pain  Bipolar Disorder: she is in hypomania today. She is having flight of ideas, pressure speech, difficulty focusing. Denies depression at this time.  She refuses to take medication.   Patient Active Problem List   Diagnosis Date Noted  . 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 Allergic  rhinitis 12/01/2014  . Anemia in chronic illness 12/01/2014  . Anxiety 12/01/2014  . Back pain, chronic 12/01/2014  . Bipolar affective disorder, currently depressed, mild 12/01/2014  . Atherosclerosis of left carotid artery 12/01/2014  . Carpal tunnel syndrome 12/01/2014  . Chronic kidney disease (CKD), stage III (moderate) 12/01/2014  . Insomnia, persistent 12/01/2014  . Chronic nonmalignant pain 12/01/2014  . CN (constipation) 12/01/2014  . Kidney cysts 12/01/2014  . Cervical osteoarthritis 12/01/2014  . Dyslipidemia 12/01/2014  . Fibromyalgia syndrome 12/01/2014  . Abnormal gait 12/01/2014  . Gastro-esophageal reflux disease without esophagitis 12/01/2014  . History of colon polyps 12/01/2014  . History of syncope 12/01/2014  . H/O transient cerebral ischemia 12/01/2014  . HPV test positive 12/01/2014  . Personal history of fall 12/01/2014  . Calculus of kidney 12/01/2014  . Lumbar radiculopathy 12/01/2014  . OCD (obsessive compulsive disorder) 12/01/2014  . Overweight 12/01/2014  . PTSD (post-traumatic stress disorder) 12/01/2014  . Trochanteric bursitis of both hips 12/01/2014    Past Surgical History  Procedure Laterality Date  . Tubal ligation      Family History  Problem Relation Age of Onset  . Peripheral vascular disease Mother   . Anxiety disorder Mother   . Depression Mother   . Thyroid disease Mother   . Congestive Heart Failure Father   . Alcohol abuse Father  History   Social History  . Marital Status: Single    Spouse Name: N/A  . Number of Children: 2  . Years of Education: N/A   Occupational History  . Not on file.   Social History Main Topics  . Smoking status: Current Every Day Smoker -- 1.00 packs/day for 25 years    Types: Cigarettes  . Smokeless tobacco: Never Used  . Alcohol Use: No  . Drug Use: No  . Sexual Activity: Not Currently   Other Topics Concern  . Not on file   Social History Narrative     Current outpatient  prescriptions:  .  ALPRAZolam (XANAX) 0.5 MG tablet, Take 1 tablet (0.5 mg total) by mouth at bedtime as needed for anxiety., Disp: 30 tablet, Rfl: 2 .  aspirin 81 MG tablet, Take 1 tablet by mouth daily., Disp: , Rfl:  .  atorvastatin (LIPITOR) 40 MG tablet, Take 1 tablet (40 mg total) by mouth daily., Disp: 30 tablet, Rfl: 5 .  hydrALAZINE (APRESOLINE) 25 MG tablet, Take 1 tablet by mouth 3 (three) times daily as needed., Disp: , Rfl:  .  ramelteon (ROZEREM) 8 MG tablet, Take 1 tablet by mouth at bedtime., Disp: , Rfl:  .  ranitidine (ZANTAC) 150 MG tablet, Take 1 tablet (150 mg total) by mouth daily as needed for heartburn., Disp: 30 tablet, Rfl: 2 .  tiZANidine (ZANAFLEX) 2 MG tablet, TAKE 1 TABLET BY MOUTH EVERY EVENING, Disp: 90 tablet, Rfl: 0 .  traMADol (ULTRAM) 50 MG tablet, Take 1 tablet (50 mg total) by mouth 3 (three) times daily., Disp: 90 tablet, Rfl: 2 .  Vitamin D, Ergocalciferol, (DRISDOL) 50000 UNITS CAPS capsule, Take 50,000 Units by mouth every 7 (seven) days., Disp: , Rfl:  .  ciprofloxacin (CIPRO) 250 MG tablet, Take 1 tablet (250 mg total) by mouth 2 (two) times daily., Disp: 6 tablet, Rfl: 0 .  diclofenac sodium (VOLTAREN) 1 % GEL, Apply 4 g topically 4 (four) times daily as needed., Disp: 100 g, Rfl: 5 .  pregabalin (LYRICA) 300 MG capsule, Take 1 capsule (300 mg total) by mouth 3 (three) times daily., Disp: 90 capsule, Rfl: 2  Allergies  Allergen Reactions  . Shellfish Allergy Shortness Of Breath, Anaphylaxis and Nausea And Vomiting    Other reaction(s): Swollen lips  . Ace Inhibitors     cough  . Codeine Hives  . Nitrofurantoin Monohyd Macro Nausea Only  . Other Nausea And Vomiting    Tetanus  . Red Dye Itching  . Tetanus Toxoid Adsorbed      ROS  Constitutional: Negative for fever or weight change.  Respiratory: Negative for cough and shortness of breath.   Cardiovascular: Negative for chest pain or palpitations.  Gastrointestinal: Negative for abdominal  pain, no bowel changes.  Musculoskeletal: Positive for gait problem or joint swelling.  Skin: Negative for rash.  Neurological: Negative for dizziness or headache.  No other specific complaints in a complete review of systems (except as listed in HPI above).  Objective  Filed Vitals:   12/20/14 1006  BP: 126/78  Pulse: 99  Temp: 97.7 F (36.5 C)  TempSrc: Oral  Resp: 16  Height: 5\' 7"  (1.702 m)  Weight: 183 lb (83.008 kg)  SpO2: 96%    Body mass index is 28.66 kg/(m^2).  Physical Exam Constitutional: Patient appears well-developed and well-nourished. Obese  No distress.  Eyes:  No scleral icterus. PERL Neck: Normal range of motion. Neck supple. Cardiovascular: Normal rate, regular rhythm  and normal heart sounds.  No murmur heard. No BLE edema. Pulmonary/Chest: Effort normal and breath sounds normal. No respiratory distress. Abdominal: Soft.  There is tenderness all over Psychiatric: Agitated today, flight of ideas, pressure speech. Muscular skeletal: generalized pain - trigger points positive   Recent Results (from the past 2160 hour(s))  POCT urinalysis dipstick     Status: Normal   Collection Time: 12/20/14 10:36 AM  Result Value Ref Range   Color, UA yellow    Clarity, UA clear    Glucose, UA neg    Bilirubin, UA neg    Ketones, UA neg    Spec Grav, UA 1.015    Blood, UA neg    pH, UA 5.0    Protein, UA neg    Urobilinogen, UA 0.2    Nitrite, UA neg    Leukocytes, UA Negative Negative     PHQ2/9: Depression screen [facility] 2/9 12/20/2014  Decreased Interest 0  Down, Depressed, Hopeless 0  PHQ - 2 Score 0     Fall Risk: Fall Risk  12/20/2014  Falls in the past year? Yes  Number falls in past yr: 2 or more  Injury with Fall? No     Assessment & Plan  1. Nocturnal enuresis We will add urine culture and if negative start medication - POCT urinalysis dipstick  2. Back pain, chronic Resume medication  - traMADol (ULTRAM) 50 MG tablet; Take 1 tablet  (50 mg total) by mouth 3 (three) times daily.  Dispense: 90 tablet; Refill: 2  3. Fibromyalgia syndrome  We will increase dose of Lyrica and monitor, she asked for stronger narcotics, but explained to her that I will not due that, FMS causes daily pain , discussed referral to pain clinic, biofeedback, and increase Lyrica - pregabalin (LYRICA) 300 MG capsule; Take 1 capsule (300 mg total) by mouth 3 (three) times daily.  Dispense: 90 capsule; Refill: 2 - diclofenac sodium (VOLTAREN) 1 % GEL; Apply 4 g topically 4 (four) times daily as needed.  Dispense: 100 g; Refill: 5  4. Mixed incontinence  - ciprofloxacin (CIPRO) 250 MG tablet; Take 1 tablet (250 mg total) by mouth 2 (two) times daily.  Dispense: 6 tablet; Refill: 0 - Urine Culture  5. Hyperparathyroidism, secondary renal Seeing nephrologist - Dr. Abigail Butts  6. Chronic kidney disease (CKD), stage III (moderate) stable  7. Anemia in chronic illness Recheck next visit  8. Anxiety Medication must last one month - ALPRAZolam (XANAX) 0.5 MG tablet; Take 1 tablet (0.5 mg total) by mouth at bedtime as needed for anxiety.  Dispense: 30 tablet; Refill: 2  9. Insomnia, persistent Interrupted because of pain  10. Gastroesophageal reflux disease without esophagitis  - ranitidine (ZANTAC) 150 MG tablet; Take 1 tablet (150 mg total) by mouth daily as needed for heartburn.  Dispense: 30 tablet; Refill: 2  11. Dyslipidemia  - atorvastatin (LIPITOR) 40 MG tablet; Take 1 tablet (40 mg total) by mouth daily.  Dispense: 30 tablet; Refill: 5  12. Abnormal gait Using a cane   13. Hypertension, benign  - hydrALAZINE (APRESOLINE) 25 MG tablet; Take 1 tablet (25 mg total) by mouth 3 (three) times daily.  Dispense: 90 tablet; Refill: 5  14. Bipolar affective disorder, current episode hypomanic  She refuse medication or referral to psychiatrist, discussed need to go to [facility] if symptoms gets worse

## 2014-12-21 LAB — URINE CULTURE

## 2015-01-05 ENCOUNTER — Other Ambulatory Visit: Payer: Self-pay

## 2015-01-19 ENCOUNTER — Telehealth: Payer: Self-pay | Admitting: Family Medicine

## 2015-01-19 DIAGNOSIS — M797 Fibromyalgia: Secondary | ICD-10-CM

## 2015-01-19 NOTE — Telephone Encounter (Signed)
PA is pending.

## 2015-01-19 NOTE — Telephone Encounter (Signed)
Patient is needing authorization on lyrica 200mg . She is almost out. I informed her that it could take up to 7days for prior authorization to go through. She is asking that you give her one prescription with a lower dosage so she will not run out.

## 2015-01-20 MED ORDER — PREGABALIN 300 MG PO CAPS: 300.0000 mg | ORAL_CAPSULE | Freq: Two times a day (BID) | ORAL | Status: DC

## 2015-01-20 NOTE — Telephone Encounter (Signed)
Tried to call patient to notify her that the PA for her Lyrica was denied due to her insurance wont allow 3 capsules per day, only will allow 2. Dr. Ancil Boozer will change to 2 capsules per day instead so her insurance will pay for it.

## 2015-01-26 ENCOUNTER — Ambulatory Visit (INDEPENDENT_AMBULATORY_CARE_PROVIDER_SITE_OTHER): Payer: Medicare Other | Admitting: Family Medicine

## 2015-01-26 ENCOUNTER — Encounter: Payer: Self-pay | Admitting: Family Medicine

## 2015-01-26 VITALS — BP 122/80 | HR 91 | Temp 98.7°F | Resp 16 | Ht 67.0 in | Wt 177.3 lb

## 2015-01-26 DIAGNOSIS — M5416 Radiculopathy, lumbar region: Secondary | ICD-10-CM | POA: Diagnosis not present

## 2015-01-26 DIAGNOSIS — Z23 Encounter for immunization: Secondary | ICD-10-CM | POA: Diagnosis not present

## 2015-01-26 DIAGNOSIS — B353 Tinea pedis: Secondary | ICD-10-CM

## 2015-01-26 DIAGNOSIS — M797 Fibromyalgia: Secondary | ICD-10-CM | POA: Diagnosis not present

## 2015-01-26 DIAGNOSIS — G8929 Other chronic pain: Secondary | ICD-10-CM | POA: Diagnosis not present

## 2015-01-26 MED ORDER — PREGABALIN 300 MG PO CAPS: 300.0000 mg | ORAL_CAPSULE | Freq: Two times a day (BID) | ORAL | Status: DC

## 2015-01-26 MED ORDER — KETOCONAZOLE 2 % EX CREA: 1.0000 "application " | TOPICAL_CREAM | Freq: Every day | CUTANEOUS | Status: DC

## 2015-01-26 MED ORDER — HYDRALAZINE HCL 25 MG PO TABS: 25.0000 mg | ORAL_TABLET | Freq: Three times a day (TID) | ORAL | Status: DC | PRN

## 2015-01-26 NOTE — Progress Notes (Signed)
Name: Samantha Macias   MRN: [id number]    DOB: 29-Jun-1960   Date:01/26/2015       Progress Note  Subjective  Chief Complaint  Chief Complaint  Patient presents with  . Follow-up    5 week  . Fibromyalgia    increased dose of lyrica at last appointment but ins. will not cover  . Hip Pain    onset 1 month wants to see about getting injection  . Shoulder Pain    onset 1 week, states she cannot raise her arms up.  Wants to also see about getting injection in shoulders  . Rash    onset 1 week on bottom of left foot  . Edema    bilateral hands and feet  . Referral    wants referral to [place] and pain care Dr. Chancy Milroy    HPI  FMS: we tried to increase dose of Lyrica on her last visit, but insurance denied. She continues to have daily pain and mental fogginess. It causes difficulty in ADL including folding clothes or cooking.   Chronic pain, history of herniated disk disease on MRI done in 12/2012, she states pain is constant , average is 8/10 over the past couple of weeks, affecting her ability to sleep because of pain ( back, left hip , shoulder ) , pain radiates down left leg , towards her left knee.  Using a walker to help with ambulation, she sates she has tingling on both feet.   Left Foot Rash: developed a rash on her left foot months ago, but worse over the past week, itchy and has some sores, also pilling left feet.   Patient Active Problem List   Diagnosis Date Noted  . 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 Allergic rhinitis 12/01/2014  . Anemia in chronic illness 12/01/2014  . Anxiety 12/01/2014  . Back pain, chronic 12/01/2014  . Bipolar affective disorder, currently depressed, mild 12/01/2014  . Atherosclerosis of left carotid artery 12/01/2014  . Carpal tunnel syndrome 12/01/2014  . Chronic kidney disease (CKD), stage III (moderate) 12/01/2014  . Insomnia, persistent 12/01/2014  . Chronic nonmalignant pain 12/01/2014  . CN (constipation)  12/01/2014  . Kidney cysts 12/01/2014  . Cervical osteoarthritis 12/01/2014  . Dyslipidemia 12/01/2014  . Fibromyalgia syndrome 12/01/2014  . Abnormal gait 12/01/2014  . Gastro-esophageal reflux disease without esophagitis 12/01/2014  . History of colon polyps 12/01/2014  . History of syncope 12/01/2014  . H/O transient cerebral ischemia 12/01/2014  . HPV test positive 12/01/2014  . Personal history of fall 12/01/2014  . Calculus of kidney 12/01/2014  . Lumbar radiculopathy 12/01/2014  . OCD (obsessive compulsive disorder) 12/01/2014  . Overweight 12/01/2014  . PTSD (post-traumatic stress disorder) 12/01/2014  . Trochanteric bursitis of both hips 12/01/2014    Past Surgical History  Procedure Laterality Date  . Tubal ligation      Family History  Problem Relation Age of Onset  . Peripheral vascular disease Mother   . Anxiety disorder Mother   . Depression Mother   . Thyroid disease Mother   . Congestive Heart Failure Father   . Alcohol abuse Father     Social History   Social History  . Marital Status: Single    Spouse Name: N/A  . Number of Children: 2  . Years of Education: N/A   Occupational History  . Not on file.   Social History Main Topics  . Smoking status: Current Every Day Smoker -- 1.00 packs/day for 25 years  Types: Cigarettes  . Smokeless tobacco: Never Used  . Alcohol Use: No  . Drug Use: No  . Sexual Activity: Not Currently   Other Topics Concern  . Not on file   Social History Narrative     Current outpatient prescriptions:  .  ALPRAZolam (XANAX) 0.5 MG tablet, Take 1 tablet (0.5 mg total) by mouth at bedtime as needed for anxiety., Disp: 30 tablet, Rfl: 2 .  aspirin 81 MG tablet, Take 1 tablet by mouth daily., Disp: , Rfl:  .  atorvastatin (LIPITOR) 40 MG tablet, Take 1 tablet (40 mg total) by mouth daily., Disp: 30 tablet, Rfl: 5 .  diclofenac sodium (VOLTAREN) 1 % GEL, Apply 4 g topically 4 (four) times daily as needed., Disp: 100  g, Rfl: 5 .  hydrALAZINE (APRESOLINE) 25 MG tablet, Take 1 tablet by mouth 3 (three) times daily as needed., Disp: , Rfl:  .  ketoconazole (NIZORAL) 2 % cream, Apply 1 application topically daily., Disp: 60 g, Rfl: 0 .  pregabalin (LYRICA) 300 MG capsule, Take 1 capsule (300 mg total) by mouth 2 (two) times daily., Disp: 60 capsule, Rfl: 2 .  ramelteon (ROZEREM) 8 MG tablet, Take 1 tablet by mouth at bedtime., Disp: , Rfl:  .  ranitidine (ZANTAC) 150 MG tablet, Take 1 tablet (150 mg total) by mouth daily as needed for heartburn., Disp: 30 tablet, Rfl: 2 .  tiZANidine (ZANAFLEX) 2 MG tablet, TAKE 1 TABLET BY MOUTH EVERY EVENING, Disp: 90 tablet, Rfl: 0 .  traMADol (ULTRAM) 50 MG tablet, Take 1 tablet (50 mg total) by mouth 3 (three) times daily., Disp: 90 tablet, Rfl: 2 .  Vitamin D, Ergocalciferol, (DRISDOL) 50000 UNITS CAPS capsule, Take 50,000 Units by mouth every 7 (seven) days., Disp: , Rfl:   Allergies  Allergen Reactions  . Shellfish Allergy Shortness Of Breath, Anaphylaxis and Nausea And Vomiting    Other reaction(s): Swollen lips  . Ace Inhibitors     cough  . Codeine Hives  . Nitrofurantoin Monohyd Macro Nausea Only  . Other Nausea And Vomiting    Tetanus  . Red Dye Itching  . Tetanus Toxoid Adsorbed      ROS  Constitutional: Negative for fever or weight change.  Respiratory: Negative for cough and shortness of breath.   Cardiovascular: Negative for chest pain or palpitations.  Gastrointestinal: Negative for abdominal pain, no bowel changes.  Musculoskeletal: Negative for gait problem or joint swelling.  Skin: Negative for rash.  Neurological: Negative for dizziness or headache.  No other specific complaints in a complete review of systems (except as listed in HPI above).  Objective  Filed Vitals:   01/26/15 0901  BP: 122/80  Pulse: 91  Temp: 98.7 F (37.1 C)  TempSrc: Oral  Resp: 16  Height: 5\' 7"  (1.702 m)  Weight: 177 lb 4.8 oz (80.423 kg)  SpO2: 94%     Body mass index is 27.76 kg/(m^2).  Physical Exam  Constitutional: Patient appears well-developed and well-nourished. Obese  No distress.  HEENT: head atraumatic, normocephalic, pupils equal and reactive to light, neck supple, throat within normal limits Cardiovascular: Normal rate, regular rhythm and normal heart sounds.  No murmur heard. No BLE edema. Pulmonary/Chest: Effort normal and breath sounds normal. No respiratory distress. Abdominal: Soft.  There is no tenderness. Psychiatric: Patient has a normal mood and affect. behavior is normal. Judgment and thought content normal. Muscular Skeletal: uses her walker also has braces on both wrist, brace right left, walking slowly, tender  trigger points, positive straight leg raise on left leg Skin: pilling on left foot, some areas of erythema and pruritus during exam  Recent Results (from the past 2160 hour(s))  Urine Culture     Status: None   Collection Time: 12/20/14 12:00 AM  Result Value Ref Range   Urine Culture, Routine Final report    Urine Culture result 1 Comment     Comment: Mixed urogenital flora Less than 10,000 colonies/mL   POCT urinalysis dipstick     Status: Normal   Collection Time: 12/20/14 10:36 AM  Result Value Ref Range   Color, UA yellow    Clarity, UA clear    Glucose, UA neg    Bilirubin, UA neg    Ketones, UA neg    Spec Grav, UA 1.015    Blood, UA neg    pH, UA 5.0    Protein, UA neg    Urobilinogen, UA 0.2    Nitrite, UA neg    Leukocytes, UA Negative Negative    PHQ2/9: Depression screen PHQ 2/9 12/20/2014  Decreased Interest 0  Down, Depressed, Hopeless 0  PHQ - 2 Score 0    Fall Risk: Fall Risk  12/20/2014  Falls in the past year? Yes  Number falls in past yr: 2 or more  Injury with Fall? No     Assessment & Plan  1. Lumbar radiculopathy  Refer to pain clinic  2. Needs flu shot  - Flu Vaccine QUAD 36+ mos PF IM (Fluarix & Fluzone Quad PF)  3. Fibromyalgia syndrome  -  pregabalin (LYRICA) 300 MG capsule; Take 1 capsule (300 mg total) by mouth 2 (two) times daily.  Dispense: 60 capsule; Refill: 2 - Ambulatory referral to Home Health  4. Chronic nonmalignant pain  - Ambulatory referral to Pain Clinic - Ambulatory referral to [place]  5. Tinea pedis of left foot  - ketoconazole (NIZORAL) 2 % cream; Apply 1 application topically daily.  Dispense: 60 g; Refill: 0

## 2015-01-31 ENCOUNTER — Ambulatory Visit: Payer: Medicare Other | Admitting: Family Medicine

## 2015-01-31 ENCOUNTER — Telehealth: Payer: Self-pay | Admitting: Family Medicine

## 2015-01-31 NOTE — Telephone Encounter (Signed)
Haven't you already put a referral in for this?

## 2015-01-31 NOTE — Telephone Encounter (Signed)
Pt states she is requesting [facility] to do an assessment of her abilities and Medicaid will be notified with how many hours she can use them. There # [phone], [facility], also [phone].Pt is requesting a call back at [phone]. Pt states it is ok to leave a message if she does not answer.

## 2015-01-31 NOTE — Telephone Encounter (Signed)
Yes, I have put this referral in. They are going to go do an assessment, I do believe this Wednesday to assess her needs. I've tried to call her many times and have had to leave a message. I spoke with Larene Beach at [facility] on Thursday. Spoke with patient and notified her of this.

## 2015-02-01 ENCOUNTER — Encounter: Payer: Self-pay | Admitting: Family Medicine

## 2015-02-06 ENCOUNTER — Other Ambulatory Visit: Payer: Self-pay | Admitting: Family Medicine

## 2015-02-09 NOTE — Telephone Encounter (Signed)
Please call pt back to see exactly what she is needing. [phone]. Patient wants [facility] is who she is wanting. Not [facility]. [phone] or [phone].

## 2015-03-16 ENCOUNTER — Other Ambulatory Visit: Payer: Self-pay

## 2015-03-16 DIAGNOSIS — F419 Anxiety disorder, unspecified: Secondary | ICD-10-CM

## 2015-03-16 MED ORDER — ALPRAZOLAM 0.5 MG PO TABS: 0.5000 mg | ORAL_TABLET | Freq: Every evening | ORAL | Status: DC | PRN

## 2015-03-16 NOTE — Telephone Encounter (Signed)
Patient requesting refill. 

## 2015-03-28 ENCOUNTER — Encounter: Payer: Self-pay | Admitting: Family Medicine

## 2015-03-28 ENCOUNTER — Ambulatory Visit (INDEPENDENT_AMBULATORY_CARE_PROVIDER_SITE_OTHER): Payer: Medicare Other | Admitting: Family Medicine

## 2015-03-28 VITALS — BP 132/88 | HR 111 | Temp 97.7°F | Resp 18 | Ht 67.0 in | Wt 186.0 lb

## 2015-03-28 DIAGNOSIS — F411 Generalized anxiety disorder: Secondary | ICD-10-CM | POA: Diagnosis not present

## 2015-03-28 DIAGNOSIS — Z91419 Personal history of unspecified adult abuse: Secondary | ICD-10-CM

## 2015-03-28 DIAGNOSIS — F3131 Bipolar disorder, current episode depressed, mild: Secondary | ICD-10-CM

## 2015-03-28 DIAGNOSIS — M549 Dorsalgia, unspecified: Secondary | ICD-10-CM

## 2015-03-28 DIAGNOSIS — G8929 Other chronic pain: Secondary | ICD-10-CM | POA: Diagnosis not present

## 2015-03-28 DIAGNOSIS — M797 Fibromyalgia: Secondary | ICD-10-CM

## 2015-03-28 MED ORDER — ALPRAZOLAM ER 0.5 MG PO TB24: 0.5000 mg | ORAL_TABLET | Freq: Every day | ORAL | Status: DC

## 2015-03-28 MED ORDER — PREGABALIN 200 MG PO CAPS: 200.0000 mg | ORAL_CAPSULE | Freq: Three times a day (TID) | ORAL | Status: DC

## 2015-03-28 MED ORDER — TRAMADOL HCL 50 MG PO TABS: 50.0000 mg | ORAL_TABLET | Freq: Three times a day (TID) | ORAL | Status: DC
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
## 2015-04-01 ENCOUNTER — Telehealth: Payer: Self-pay
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
## 2015-04-14 ENCOUNTER — Telehealth: Payer: Self-pay | Admitting: Family Medicine

## 2015-04-14 NOTE — Telephone Encounter (Signed)
Shelly, have you heard from her pharmacy about a prior auth?

## 2015-04-14 NOTE — Telephone Encounter (Signed)
Patient stated that her pharmacy is requesting a prior authorization on Alprazolam 0.5mg  24 hr tablet.  Patient stated that if insurance can't get approved then she would like her old prescription of Alprazolam (2-3 tablets daily) sent to the pharmacy.

## 2015-04-15 ENCOUNTER — Telehealth: Payer: Self-pay

## 2015-04-15 NOTE — Telephone Encounter (Signed)
Patient states her Alprazolam XR was not covered by [employer], tried doing a PA and it was denied. Patient was using the regular Alprazolam in place of the extended release for sleep and anxiety, due to extreme stress. Patient took her last one last night and needs just a 13 day supply until her next prescription is due for refilling. Patient is so stressed and worried about stopping the medication cold [place] due to side effects.

## 2015-04-15 NOTE — Telephone Encounter (Signed)
PA was denied. Pt has called in requesting a refill on original Rx

## 2015-04-17 NOTE — Telephone Encounter (Signed)
Please call pharmacy to verify what happened to alprazolam XR and when she filled regular alprazolam

## 2015-04-18 ENCOUNTER — Other Ambulatory Visit: Payer: Self-pay | Admitting: Family Medicine

## 2015-04-18 MED ORDER — ALPRAZOLAM 0.5 MG PO TABS: 0.5000 mg | ORAL_TABLET | Freq: Every evening | ORAL | Status: DC | PRN

## 2015-04-18 NOTE — Telephone Encounter (Signed)
Spoke with pharmacist at [facility] in [place] about prescription, and she confirmed the Alprazolam XR was never filled due to non coverage from insurance and 03/30/15

## 2015-04-18 NOTE — Telephone Encounter (Signed)
03/30/15 was her last prescription refill of the regular Alprazolam, Dr. Ancil Boozer verbalized ok to fill a prescription of Regular Alprazolam 0.5 on today date for 1 to 1.5 tablets daily quantity 45 and must last 30 days.

## 2015-04-25 DIAGNOSIS — F1120 Opioid dependence, uncomplicated: Secondary | ICD-10-CM | POA: Diagnosis not present

## 2015-04-25 DIAGNOSIS — G8929 Other chronic pain: Secondary | ICD-10-CM | POA: Diagnosis not present

## 2015-04-25 DIAGNOSIS — M329 Systemic lupus erythematosus, unspecified: Secondary | ICD-10-CM | POA: Diagnosis not present

## 2015-04-25 DIAGNOSIS — F4312 Post-traumatic stress disorder, chronic: Secondary | ICD-10-CM | POA: Diagnosis not present

## 2015-04-25 DIAGNOSIS — G894 Chronic pain syndrome: Secondary | ICD-10-CM | POA: Diagnosis not present

## 2015-04-25 DIAGNOSIS — M797 Fibromyalgia: Secondary | ICD-10-CM | POA: Diagnosis not present

## 2015-05-13 ENCOUNTER — Ambulatory Visit (INDEPENDENT_AMBULATORY_CARE_PROVIDER_SITE_OTHER): Payer: Medicare Other | Admitting: Family Medicine

## 2015-05-13 ENCOUNTER — Encounter: Payer: Self-pay | Admitting: Family Medicine

## 2015-05-13 DIAGNOSIS — R269 Unspecified abnormalities of gait and mobility: Secondary | ICD-10-CM

## 2015-05-13 DIAGNOSIS — G8929 Other chronic pain: Secondary | ICD-10-CM

## 2015-05-13 DIAGNOSIS — M797 Fibromyalgia: Secondary | ICD-10-CM

## 2015-05-13 NOTE — Progress Notes (Signed)
Name: Samantha Macias   MRN: [id number]    DOB: 23-Sep-1960   Date:05/13/2015       Progress Note  Subjective  Chief Complaint  Chief Complaint  Patient presents with  . Letter for School/Work    letter to get extra help    HPI  FMS/Chronic pain/Gait imbalance: she has multiple medication problems, having an aid that is helping her at home, with changing, cleaning her house, sometimes assists with transferring and bathing.  She is currently having 60 hours of care. Explained that she may not be able to get more than that, but I will send another request to [facility].   Patient Active Problem List   Diagnosis Date Noted  . Hyperparathyroidism, secondary renal ([place]) 12/03/2014  . Allergic rhinitis 12/01/2014  . Anemia in chronic illness 12/01/2014  . GAD (generalized anxiety disorder) 12/01/2014  . Back pain, chronic 12/01/2014  . Bipolar affective disorder, currently depressed, mild ([place]) 12/01/2014  . Atherosclerosis of left carotid artery 12/01/2014  . Carpal tunnel syndrome 12/01/2014  . Chronic kidney disease (CKD), stage III (moderate) 12/01/2014  . Insomnia, persistent 12/01/2014  . Chronic nonmalignant pain 12/01/2014  . CN (constipation) 12/01/2014  . Kidney cysts 12/01/2014  . Cervical osteoarthritis 12/01/2014  . Dyslipidemia 12/01/2014  . Fibromyalgia syndrome 12/01/2014  . Abnormal gait 12/01/2014  . Gastro-esophageal reflux disease without esophagitis 12/01/2014  . History of colon polyps 12/01/2014  . History of syncope 12/01/2014  . H/O transient cerebral ischemia 12/01/2014  . HPV test positive 12/01/2014  . Personal history of fall 12/01/2014  . Calculus of kidney 12/01/2014  . Lumbar radiculopathy 12/01/2014  . OCD (obsessive compulsive disorder) 12/01/2014  . Overweight 12/01/2014  . PTSD (post-traumatic stress disorder) 12/01/2014  . Trochanteric bursitis of both hips 12/01/2014    Past Surgical History  Procedure Laterality Date  . Tubal  ligation      Family History  Problem Relation Age of Onset  . Peripheral vascular disease Mother   . Anxiety disorder Mother   . Depression Mother   . Thyroid disease Mother   . Congestive Heart Failure Father   . Alcohol abuse Father     Social History   Social History  . Marital Status: Single    Spouse Name: N/A  . Number of Children: 2  . Years of Education: N/A   Occupational History  . Not on file.   Social History Main Topics  . Smoking status: Current Every Day Smoker -- 1.00 packs/day for 25 years    Types: Cigarettes  . Smokeless tobacco: Never Used  . Alcohol Use: No  . Drug Use: No  . Sexual Activity: Not Currently   Other Topics Concern  . Not on file   Social History Narrative     Current outpatient prescriptions:  .  ALPRAZolam (XANAX) 0.5 MG tablet, Take 1-1.5 tablets (0.5-0.75 mg total) by mouth at bedtime as needed for anxiety., Disp: 45 tablet, Rfl: 2 .  aspirin 81 MG tablet, Take 1 tablet by mouth daily., Disp: , Rfl:  .  atorvastatin (LIPITOR) 40 MG tablet, Take 1 tablet (40 mg total) by mouth daily., Disp: 30 tablet, Rfl: 5 .  diclofenac sodium (VOLTAREN) 1 % GEL, Apply 4 g topically 4 (four) times daily as needed., Disp: 100 g, Rfl: 5 .  hydrALAZINE (APRESOLINE) 25 MG tablet, Take 1 tablet (25 mg total) by mouth 3 (three) times daily as needed., Disp: 90 tablet, Rfl: 2 .  ketoconazole (NIZORAL) 2 %  cream, Apply 1 application topically daily., Disp: 60 g, Rfl: 0 .  LYRICA 300 MG capsule, TK 1 C PO BID, Disp: , Rfl: 2 .  pregabalin (LYRICA) 200 MG capsule, Take 1 capsule (200 mg total) by mouth 3 (three) times daily., Disp: 90 capsule, Rfl: 2 .  ramelteon (ROZEREM) 8 MG tablet, Take 1 tablet by mouth at bedtime., Disp: , Rfl:  .  ranitidine (ZANTAC) 150 MG tablet, Take 1 tablet (150 mg total) by mouth daily as needed for heartburn., Disp: 30 tablet, Rfl: 2 .  tiZANidine (ZANAFLEX) 2 MG tablet, TAKE 1 TABLET BY MOUTH EVERY EVENING, Disp: 90  tablet, Rfl: 0 .  traMADol (ULTRAM) 50 MG tablet, Take 1 tablet (50 mg total) by mouth 3 (three) times daily., Disp: 90 tablet, Rfl: 2 .  Vitamin D, Ergocalciferol, (DRISDOL) 50000 UNITS CAPS capsule, Take 50,000 Units by mouth every 7 (seven) days., Disp: , Rfl:   Allergies  Allergen Reactions  . Shellfish Allergy Shortness Of Breath, Anaphylaxis and Nausea And Vomiting    Other reaction(s): Swollen lips  . Ace Inhibitors     cough  . Codeine Hives  . Nitrofurantoin Monohyd Macro Nausea Only  . Other Nausea And Vomiting    Tetanus  . Red Dye Itching  . Tetanus Toxoid Adsorbed      ROS  Ten systems reviewed and is negative except as mentioned in HPI   Objective  Filed Vitals:   05/13/15 1013  Pulse: 112  Temp: 99 F (37.2 C)  TempSrc: Oral  Resp: 16  Height: 5\' 7"  (1.702 m)  Weight: 185 lb (83.915 kg)  SpO2: 96%    Body mass index is 28.97 kg/(m^2).  Physical Exam  Constitutional: Patient appears well-developed and well-nourished. No distress.  HEENT: head atraumatic, normocephalic, pupils equal and reactive to light, neck supple, throat within normal limits Cardiovascular: Normal rate, regular rhythm and normal heart sounds.  No murmur heard. No BLE edema. Pulmonary/Chest: Effort normal and breath sounds normal. No respiratory distress. Abdominal: Soft.  There is no tenderness. Psychiatric: Patient has a normal mood and affect.Pressure speech Muscular Skeletal: braces on both wrists for carpal tunnel, has a cane, pain palpation of trigger points.   PHQ2/9: Depression screen [facility] 2/9 03/28/2015 12/20/2014  Decreased Interest 0 0  Down, Depressed, Hopeless 0 0  PHQ - 2 Score 0 0     Fall Risk: Fall Risk  03/28/2015 12/20/2014  Falls in the past year? Yes Yes  Number falls in past yr: 2 or more 2 or more  Injury with Fall? Yes No     Assessment & Plan  1. Fibromyalgia syndrome  - Ambulatory referral to Home Health  2. Chronic nonmalignant pain  -  Ambulatory referral to Home Health  3. Abnormal gait  - Ambulatory referral to [facility]

## 2015-05-14 ENCOUNTER — Other Ambulatory Visit: Payer: Self-pay | Admitting: Family Medicine

## 2015-05-20 ENCOUNTER — Telehealth: Payer: Self-pay | Admitting: Family Medicine

## 2015-05-20 NOTE — Telephone Encounter (Signed)
Called [facility] and they are faxing forms

## 2015-05-20 NOTE — Telephone Encounter (Signed)
Patient states that a form from [facility] care last week for her to get more hours for her aid and they have not received anything (F) [phone] or [phone]. She stated that you did send something to [facility] but she needed to go to [facility]. Please return patient call to discuss so she can explain.

## 2015-06-03 ENCOUNTER — Telehealth: Payer: Self-pay | Admitting: Family Medicine

## 2015-06-03 NOTE — Telephone Encounter (Signed)
Pt states the form for her to get additional home aide hours was denied due to one box not being checked on her DMA form section B. Pt would like for this to be completed and faxed back in. Please advise pt when this is completed. Form was not faxed back to [place] but pt states we should have a copy of this in her chart.

## 2015-06-03 NOTE — Telephone Encounter (Signed)
I need to see the document. Please print it for me . Thank you

## 2015-06-07 NOTE — Telephone Encounter (Signed)
I reviewed the document, and she does not qualify for the other two boxes. I am sorry.

## 2015-06-07 NOTE — Telephone Encounter (Signed)
Patient states the Impacts of ADL's were missing on the form, if we could please fill this portion out and resend it. She would greatly appreciate it. Thanks.

## 2015-06-30 ENCOUNTER — Ambulatory Visit: Payer: Medicare Other | Admitting: Family Medicine

## 2015-07-01 ENCOUNTER — Other Ambulatory Visit: Payer: Self-pay | Admitting: Family Medicine

## 2015-07-01 ENCOUNTER — Ambulatory Visit (INDEPENDENT_AMBULATORY_CARE_PROVIDER_SITE_OTHER): Payer: Medicare Other | Admitting: Family Medicine

## 2015-07-01 ENCOUNTER — Encounter: Payer: Self-pay | Admitting: Family Medicine

## 2015-07-01 VITALS — BP 114/72 | HR 105 | Temp 98.0°F | Resp 16 | Ht 67.0 in | Wt 186.9 lb

## 2015-07-01 DIAGNOSIS — N183 Chronic kidney disease, stage 3 unspecified: Secondary | ICD-10-CM

## 2015-07-01 DIAGNOSIS — K219 Gastro-esophageal reflux disease without esophagitis: Secondary | ICD-10-CM | POA: Diagnosis not present

## 2015-07-01 DIAGNOSIS — F3131 Bipolar disorder, current episode depressed, mild: Secondary | ICD-10-CM | POA: Diagnosis not present

## 2015-07-01 DIAGNOSIS — N2581 Secondary hyperparathyroidism of renal origin: Secondary | ICD-10-CM

## 2015-07-01 DIAGNOSIS — M5416 Radiculopathy, lumbar region: Secondary | ICD-10-CM

## 2015-07-01 DIAGNOSIS — E785 Hyperlipidemia, unspecified: Secondary | ICD-10-CM

## 2015-07-01 DIAGNOSIS — Z9181 History of falling: Secondary | ICD-10-CM | POA: Diagnosis not present

## 2015-07-01 DIAGNOSIS — G4700 Insomnia, unspecified: Secondary | ICD-10-CM

## 2015-07-01 DIAGNOSIS — M549 Dorsalgia, unspecified: Secondary | ICD-10-CM

## 2015-07-01 DIAGNOSIS — I10 Essential (primary) hypertension: Secondary | ICD-10-CM | POA: Diagnosis not present

## 2015-07-01 DIAGNOSIS — R269 Unspecified abnormalities of gait and mobility: Secondary | ICD-10-CM

## 2015-07-01 DIAGNOSIS — G8929 Other chronic pain: Secondary | ICD-10-CM

## 2015-07-01 DIAGNOSIS — M797 Fibromyalgia: Secondary | ICD-10-CM | POA: Diagnosis not present

## 2015-07-01 MED ORDER — HYDRALAZINE HCL 25 MG PO TABS: 25.0000 mg | ORAL_TABLET | Freq: Three times a day (TID) | ORAL | Status: DC | PRN

## 2015-07-01 MED ORDER — PREGABALIN 200 MG PO CAPS: 200.0000 mg | ORAL_CAPSULE | Freq: Three times a day (TID) | ORAL | Status: DC

## 2015-07-01 MED ORDER — TRAMADOL HCL 50 MG PO TABS: 50.0000 mg | ORAL_TABLET | Freq: Three times a day (TID) | ORAL | Status: DC

## 2015-07-01 MED ORDER — LOSARTAN POTASSIUM-HCTZ 50-12.5 MG PO TABS: 1.0000 | ORAL_TABLET | Freq: Every day | ORAL | Status: DC

## 2015-07-01 MED ORDER — ATORVASTATIN CALCIUM 40 MG PO TABS: 40.0000 mg | ORAL_TABLET | Freq: Every day | ORAL | Status: DC

## 2015-07-01 MED ORDER — ALPRAZOLAM 0.5 MG PO TABS: 0.5000 mg | ORAL_TABLET | Freq: Every evening | ORAL | Status: DC | PRN

## 2015-07-01 MED ORDER — DICLOFENAC SODIUM 1 % TD GEL: 4.0000 g | Freq: Four times a day (QID) | TRANSDERMAL | Status: DC | PRN

## 2015-07-01 MED ORDER — TIZANIDINE HCL 2 MG PO TABS
2.0000 mg | ORAL_TABLET | Freq: Every evening | ORAL | Status: DC
Start: 2015-07-01 — End: 2015-07-12

## 2015-07-01 MED ORDER — QUETIAPINE FUMARATE 50 MG PO TABS: 50.0000 mg | ORAL_TABLET | Freq: Every day | ORAL | Status: DC

## 2015-07-01 MED ORDER — RANITIDINE HCL 150 MG PO TABS: 150.0000 mg | ORAL_TABLET | Freq: Every day | ORAL | Status: DC | PRN

## 2015-07-01 NOTE — Telephone Encounter (Signed)
Patient requesting refill. 

## 2015-07-01 NOTE — Progress Notes (Signed)
Name: Samantha Macias   MRN: [id number]    DOB: April 05, 1961   Date:07/01/2015       Progress Note  Subjective  Chief Complaint  Chief Complaint  Patient presents with  . Medication Refill    3 month F/U  . Anxiety    Medication helps her symptoms and would like to try Trintellix  . Insomnia    Waking up every two hours, feels extremely tired throughout the day  . Fibromyalgia    Worsen, bilateral hip pain.  Marland Kitchen Hyperlipidemia  . Gastroesophageal Reflux    Improving symptoms, helps with bloating    HPI   HTN: she has been taking Hydralazine, she has been off Losartan/hctz- bp is low, so we will stop Hydralazine and go back to Losartan/HCTZ. No chest pain or palpitation . She was on Hydralazine when she had a tooth infection and had a lot of pain.   CKI: seeing Nephrologist but no labs recently, we will recheck labs  Insomnia: she was on Ambien but is no on Rozerem because of insurance coverage, she has been falling asleep, but wakes up during the night, she denies naps during the day. She would like to go back on Ambien but I explained that not covered and also it is risk for her because of history of falls.  Anxiety: she continues to take Alprazolam and she is not running out of medication, it helps her mood. She would like to try Trintellix but explained that because she has a history of Bipolar Disorder she needs to take a mood stabilizer. She wants to try Seroquel again  FMS: she has generalized pain, all over her body, she was getting an aid, for 3 hours five days weekly, she has family members with her during the weekend. Her pain is 8/10. She has joint stiffness, muscle aches, back pain  Bipolar Disorder: she is in hypomania today. She is having flight of ideas, pressure speech, difficulty focusing. Denies depression at this time. She is willing to try Seroquel again  Recent Fall: she states she went to the bathroom at night - and lost her balance and fell on her side, hit  the bed first. She has noticed that taking a hot shower can also make her get dizzy, likely orthostatic we will change bp , hold Hydralazine for now  Chronic low back with episodic radiculitis: taking Tramadol prn, pain goes down left lower leg, sometimes hips are locked. Pain at this time is 7-8/10  Patient Active Problem List   Diagnosis Date Noted  . Hyperparathyroidism, secondary renal ([place]) 12/03/2014  . Allergic rhinitis 12/01/2014  . Anemia in chronic illness 12/01/2014  . GAD (generalized anxiety disorder) 12/01/2014  . Back pain, chronic 12/01/2014  . Bipolar affective disorder, currently depressed, mild ([place]) 12/01/2014  . Atherosclerosis of left carotid artery 12/01/2014  . Carpal tunnel syndrome 12/01/2014  . Chronic kidney disease (CKD), stage III (moderate) 12/01/2014  . Insomnia, persistent 12/01/2014  . Chronic nonmalignant pain 12/01/2014  . CN (constipation) 12/01/2014  . Kidney cysts 12/01/2014  . Cervical osteoarthritis 12/01/2014  . Dyslipidemia 12/01/2014  . Fibromyalgia syndrome 12/01/2014  . Abnormal gait 12/01/2014  . Gastro-esophageal reflux disease without esophagitis 12/01/2014  . History of colon polyps 12/01/2014  . History of syncope 12/01/2014  . H/O transient cerebral ischemia 12/01/2014  . HPV test positive 12/01/2014  . Personal history of fall 12/01/2014  . Calculus of kidney 12/01/2014  . Lumbar radiculopathy 12/01/2014  . OCD (obsessive compulsive disorder)  12/01/2014  . Overweight 12/01/2014  . PTSD (post-traumatic stress disorder) 12/01/2014  . Trochanteric bursitis of both hips 12/01/2014  . 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 Tubal ligation      Family History  Problem Relation Age of Onset  . Peripheral vascular disease Mother   . Anxiety disorder Mother   . Depression Mother   . Thyroid disease Mother   . Congestive Heart Failure Father   . Alcohol abuse Father      Social History   Social History  . Marital Status: Single    Spouse Name: N/A  . Number of Children: 2  . Years of Education: N/A   Occupational History  . Not on file.   Social History Main Topics  . Smoking status: Current Every Day Smoker -- 1.00 packs/day for 25 years    Types: Cigarettes  . Smokeless tobacco: Never Used  . Alcohol Use: No  . Drug Use: No  . Sexual Activity: Not Currently   Other Topics Concern  . Not on file   Social History Narrative     Current outpatient prescriptions:  .  ALPRAZolam (XANAX) 0.5 MG tablet, Take 1-1.5 tablets (0.5-0.75 mg total) by mouth at bedtime as needed for anxiety., Disp: 45 tablet, Rfl: 2 .  aspirin 81 MG tablet, Take 1 tablet by mouth daily., Disp: , Rfl:  .  atorvastatin (LIPITOR) 40 MG tablet, Take 1 tablet (40 mg total) by mouth daily., Disp: 30 tablet, Rfl: 5 .  Cholecalciferol (VITAMIN D) 2000 units tablet, Take 2,000 Units by mouth daily., Disp: , Rfl:  .  diclofenac sodium (VOLTAREN) 1 % GEL, Apply 4 g topically 4 (four) times daily as needed., Disp: 100 g, Rfl: 5 .  hydrALAZINE (APRESOLINE) 25 MG tablet, Take 1 tablet (25 mg total) by mouth 3 (three) times daily as needed., Disp: 90 tablet, Rfl: 2 .  ketoconazole (NIZORAL) 2 % cream, Apply 1 application topically daily., Disp: 60 g, Rfl: 0 .  losartan-hydrochlorothiazide (HYZAAR) 50-12.5 MG tablet, , Disp: , Rfl:  .  LYRICA 300 MG capsule, TK 1 C PO BID, Disp: , Rfl: 2 .  pregabalin (LYRICA) 200 MG capsule, Take 1 capsule (200 mg total) by mouth 3 (three) times daily., Disp: 90 capsule, Rfl: 2 .  ranitidine (ZANTAC) 150 MG tablet, Take 1 tablet (150 mg total) by mouth daily as needed for heartburn., Disp: 30 tablet, Rfl: 2 .  ROZEREM 8 MG tablet, TAKE 1 TABLET BY MOUTH EVERY EVENING, Disp: 30 tablet, Rfl: 5 .  tiZANidine (ZANAFLEX) 2 MG tablet, TAKE 1 TABLET BY MOUTH EVERY EVENING, Disp: 90 tablet, Rfl: 0 .  traMADol (ULTRAM) 50 MG tablet, Take 1 tablet (50 mg  total) by mouth 3 (three) times daily., Disp: 90 tablet, Rfl: 2  Allergies  Allergen Reactions  . Shellfish Allergy Shortness Of Breath, Anaphylaxis and Nausea And Vomiting    Other reaction(s): Swollen lips  . Ace Inhibitors     cough  . Codeine Hives  . Nitrofurantoin Monohyd Macro Nausea Only  . Other Nausea And Vomiting    Tetanus  . Red Dye Itching  . Tetanus Toxoid Adsorbed      ROS  Constitutional: Negative for fever or significant  weight change.  Respiratory: Negative for cough and shortness of breath.   Cardiovascular: Negative for chest pain or palpitations.  Gastrointestinal: Negative for abdominal pain, no bowel changes.  Musculoskeletal: Positive for gait problem,  but no joint swelling.  Skin: Negative for rash.  Neurological: Positive  for dizziness but no  headache.  No other specific complaints in a complete review of systems (except as listed in HPI above).  Objective  Filed Vitals:   07/01/15 1034  BP: 114/72  Pulse: 105  Temp: 98 F (36.7 C)  TempSrc: Oral  Resp: 16  Height: 5\' 7"  (1.702 m)  Weight: 186 lb 14.4 oz (84.777 kg)  SpO2: 96%    Body mass index is 29.27 kg/(m^2).  Physical Exam  Constitutional: Patient appears well-developed and well-nourished. Obese No distress.  HEENT: head atraumatic, normocephalic, pupils equal and reactive to light neck supple, throat within normal limits Cardiovascular: Normal rate, regular rhythm and normal heart sounds.  No murmur heard. No BLE edema. Pulmonary/Chest: Effort normal and breath sounds normal. No respiratory distress. Abdominal: Soft.  There is no tenderness. Psychiatric: Pressure speech, flight of ideas, fidgety. Muscular Skeletal: tender pressure points, negative straight leg raise, using a cane. Normal sensation   PHQ2/9: Depression screen [facility] 2/9 03/28/2015 12/20/2014  Decreased Interest 0 0  Down, Depressed, Hopeless 0 0  PHQ - 2 Score 0 0    Fall Risk: Fall Risk  06/20/2015  03/28/2015 12/20/2014  Falls in the past year? No Yes Yes  Number falls in past yr: - 2 or more 2 or more  Injury with Fall? - Yes No    Functional Status Survey: Is the patient deaf or have difficulty hearing?: No Does the patient have difficulty seeing, even when wearing glasses/contacts?: Yes (glasses) Does the patient have difficulty concentrating, remembering, or making decisions?: No Does the patient have difficulty walking or climbing stairs?: Yes (walks with a cane) Does the patient have difficulty dressing or bathing?: No Does the patient have difficulty doing errands alone such as visiting a doctor's office or shopping?: No    Assessment & Plan  1. Fibromyalgia syndrome  - pregabalin (LYRICA) 200 MG capsule; Take 1 capsule (200 mg total) by mouth 3 (three) times daily.  Dispense: 90 capsule; Refill: 5 - traMADol (ULTRAM) 50 MG tablet; Take 1 tablet (50 mg total) by mouth 3 (three) times daily.  Dispense: 90 tablet; Refill: 2 - diclofenac sodium (VOLTAREN) 1 % GEL; Apply 4 g topically 4 (four) times daily as needed.  Dispense: 100 g; Refill: 5  2. Abnormal gait  stable  3. Insomnia, persistent  Continue Rozerem  4. Lumbar radiculopathy  Take medication prn   5. History of recent fall  We will decrease bp medication dose and see if symptoms improves  6. Bipolar affective disorder, currently depressed, mild ([facility])  - QUEtiapine (SEROQUEL) 50 MG tablet; Take 1-2 tablets (50-100 mg total) by mouth at bedtime.  Dispense: 60 tablet; Refill: 0  7. Gastroesophageal reflux disease without esophagitis  - ranitidine (ZANTAC) 150 MG tablet; Take 1 tablet (150 mg total) by mouth daily as needed for heartburn.  Dispense: 30 tablet; Refill: 2  8. Back pain, chronic  - tiZANidine (ZANAFLEX) 2 MG tablet; Take 1 tablet (2 mg total) by mouth every evening.  Dispense: 90 tablet; Refill: 1 - pregabalin (LYRICA) 200 MG capsule; Take 1 capsule (200 mg total) by mouth 3 (three)  times daily.  Dispense: 90 capsule; Refill: 5 - traMADol (ULTRAM) 50 MG tablet; Take 1 tablet (50 mg total) by mouth 3 (three) times daily.  Dispense: 90 tablet; Refill: 2  9. Dyslipidemia  - atorvastatin (LIPITOR) 40 MG tablet; Take 1 tablet (40 mg total) by  mouth daily.  Dispense: 30 tablet; Refill: 5 - Lipid panel  10. Hypertension, benign  Stop Hydralazine - losartan-hydrochlorothiazide (HYZAAR) 50-12.5 MG tablet; Take 1 tablet by mouth daily.  Dispense: 30 tablet; Refill: 5 - Comprehensive Metabolic Panel (CMET)  11. Chronic kidney disease (CKD), stage III (moderate)  - Hematocrit - Comprehensive Metabolic Panel (CMET)  12. Hyperparathyroidism, secondary renal ([place])  - Parathyroid hormone, intact (no Ca)

## 2015-07-06 DIAGNOSIS — N183 Chronic kidney disease, stage 3 (moderate): Secondary | ICD-10-CM | POA: Diagnosis not present

## 2015-07-06 DIAGNOSIS — N2581 Secondary hyperparathyroidism of renal origin: Secondary | ICD-10-CM | POA: Diagnosis not present

## 2015-07-06 DIAGNOSIS — R739 Hyperglycemia, unspecified: Secondary | ICD-10-CM | POA: Diagnosis not present

## 2015-07-06 DIAGNOSIS — I10 Essential (primary) hypertension: Secondary | ICD-10-CM | POA: Diagnosis not present

## 2015-07-06 DIAGNOSIS — E785 Hyperlipidemia, unspecified: Secondary | ICD-10-CM | POA: Diagnosis not present

## 2015-07-07 ENCOUNTER — Other Ambulatory Visit: Payer: Self-pay

## 2015-07-07 ENCOUNTER — Emergency Department: Payer: Medicare Other

## 2015-07-07 ENCOUNTER — Emergency Department
Admission: EM | Admit: 2015-07-07 | Discharge: 2015-07-07 | Disposition: A | Discharge status: Home / Self Care | Payer: Medicare Other | Attending: Emergency Medicine | Admitting: Emergency Medicine

## 2015-07-07 ENCOUNTER — Other Ambulatory Visit: Payer: Self-pay | Admitting: Family Medicine

## 2015-07-07 ENCOUNTER — Encounter: Payer: Self-pay | Admitting: Emergency Medicine

## 2015-07-07 DIAGNOSIS — H578 Other specified disorders of eye and adnexa: Secondary | ICD-10-CM | POA: Insufficient documentation

## 2015-07-07 DIAGNOSIS — G8929 Other chronic pain: Secondary | ICD-10-CM

## 2015-07-07 DIAGNOSIS — N183 Chronic kidney disease, stage 3 (moderate): Secondary | ICD-10-CM | POA: Insufficient documentation

## 2015-07-07 DIAGNOSIS — H538 Other visual disturbances: Secondary | ICD-10-CM | POA: Insufficient documentation

## 2015-07-07 DIAGNOSIS — R4789 Other speech disturbances: Secondary | ICD-10-CM | POA: Diagnosis not present

## 2015-07-07 DIAGNOSIS — R202 Paresthesia of skin: Secondary | ICD-10-CM | POA: Diagnosis not present

## 2015-07-07 DIAGNOSIS — F3131 Bipolar disorder, current episode depressed, mild: Secondary | ICD-10-CM

## 2015-07-07 DIAGNOSIS — Z79899 Other long term (current) drug therapy: Secondary | ICD-10-CM | POA: Insufficient documentation

## 2015-07-07 DIAGNOSIS — F17210 Nicotine dependence, cigarettes, uncomplicated: Secondary | ICD-10-CM | POA: Insufficient documentation

## 2015-07-07 DIAGNOSIS — R739 Hyperglycemia, unspecified: Secondary | ICD-10-CM

## 2015-07-07 DIAGNOSIS — R7989 Other specified abnormal findings of blood chemistry: Secondary | ICD-10-CM

## 2015-07-07 DIAGNOSIS — R42 Dizziness and giddiness: Secondary | ICD-10-CM | POA: Insufficient documentation

## 2015-07-07 DIAGNOSIS — I129 Hypertensive chronic kidney disease with stage 1 through stage 4 chronic kidney disease, or unspecified chronic kidney disease: Secondary | ICD-10-CM | POA: Insufficient documentation

## 2015-07-07 DIAGNOSIS — M797 Fibromyalgia: Secondary | ICD-10-CM

## 2015-07-07 DIAGNOSIS — Z7982 Long term (current) use of aspirin: Secondary | ICD-10-CM | POA: Insufficient documentation

## 2015-07-07 DIAGNOSIS — I639 Cerebral infarction, unspecified: Secondary | ICD-10-CM | POA: Diagnosis not present

## 2015-07-07 DIAGNOSIS — M6281 Muscle weakness (generalized): Secondary | ICD-10-CM | POA: Diagnosis not present

## 2015-07-07 DIAGNOSIS — I10 Essential (primary) hypertension: Secondary | ICD-10-CM

## 2015-07-07 DIAGNOSIS — M549 Dorsalgia, unspecified: Secondary | ICD-10-CM

## 2015-07-07 DIAGNOSIS — H539 Unspecified visual disturbance: Secondary | ICD-10-CM

## 2015-07-07 DIAGNOSIS — B353 Tinea pedis: Secondary | ICD-10-CM

## 2015-07-07 DIAGNOSIS — K219 Gastro-esophageal reflux disease without esophagitis: Secondary | ICD-10-CM

## 2015-07-07 LAB — COMPREHENSIVE METABOLIC PANEL
A/G RATIO: 1.7 (ref 1.1–2.5)
ALBUMIN: 4 g/dL (ref 3.5–5.5)
ALT: 13 IU/L (ref 0–32)
ALT: 13 U/L — AB (ref 14–54)
AST: 20 IU/L (ref 0–40)
AST: 20 U/L (ref 15–41)
Albumin: 3.8 g/dL (ref 3.5–5.0)
Alkaline Phosphatase: 107 IU/L (ref 39–117)
Alkaline Phosphatase: 87 U/L (ref 38–126)
Anion gap: 6 (ref 5–15)
BUN/Creatinine Ratio: 13 (ref 9–23)
BUN: 16 mg/dL (ref 6–24)
BUN: 23 mg/dL — ABNORMAL HIGH (ref 6–20)
Bilirubin Total: <0.2 mg/dL (ref 0.0–1.2)
CALCIUM: 9.1 mg/dL (ref 8.7–10.2)
CHLORIDE: 107 mmol/L (ref 101–111)
CO2: 24 mmol/L (ref 18–29)
CO2: 27 mmol/L (ref 22–32)
CREATININE: 1.07 mg/dL — AB (ref 0.44–1.00)
Calcium: 9.1 mg/dL (ref 8.9–10.3)
Chloride: 104 mmol/L (ref 96–106)
Creatinine, Ser: 1.25 mg/dL — ABNORMAL HIGH (ref 0.57–1.00)
GFR, EST AFRICAN AMERICAN: 57 mL/min/{1.73_m2} — AB (ref >59)
GFR, EST NON AFRICAN AMERICAN: 49 mL/min/{1.73_m2} — AB (ref >59)
GFR, EST NON AFRICAN AMERICAN: 58 mL/min — AB (ref >60)
GLOBULIN, TOTAL: 2.4 g/dL (ref 1.5–4.5)
Glucose, Bld: 85 mg/dL (ref 65–99)
Glucose: 101 mg/dL — ABNORMAL HIGH (ref 65–99)
POTASSIUM: 4.5 mmol/L (ref 3.5–5.2)
POTASSIUM: 4.8 mmol/L (ref 3.5–5.1)
SODIUM: 141 mmol/L (ref 134–144)
SODIUM: 140 mmol/L (ref 135–145)
TOTAL PROTEIN: 6.4 g/dL (ref 6.0–8.5)
Total Bilirubin: 0.5 mg/dL (ref 0.3–1.2)
Total Protein: 6.8 g/dL (ref 6.5–8.1)

## 2015-07-07 LAB — PARATHYROID HORMONE, INTACT (NO CA): PTH: 67 pg/mL — ABNORMAL HIGH (ref 15–65)

## 2015-07-07 LAB — CBC
HCT: 42.2 % (ref 35.0–47.0)
Hemoglobin: 14 g/dL (ref 12.0–16.0)
MCH: 30 pg (ref 26.0–34.0)
MCHC: 33.1 g/dL (ref 32.0–36.0)
MCV: 90.5 fL (ref 80.0–100.0)
PLATELETS: 167 10*3/uL (ref 150–440)
RBC: 4.66 MIL/uL (ref 3.80–5.20)
RDW: 12.9 % (ref 11.5–14.5)
WBC: 6.3 10*3/uL (ref 3.6–11.0)

## 2015-07-07 LAB — DIFFERENTIAL
BASOS ABS: 0 10*3/uL (ref 0–0.1)
BASOS PCT: 1 %
EOS ABS: 0.2 10*3/uL (ref 0–0.7)
Eosinophils Relative: 4 %
Lymphocytes Relative: 50 %
Lymphs Abs: 3.1 10*3/uL (ref 1.0–3.6)
MONO ABS: 0.5 10*3/uL (ref 0.2–0.9)
Monocytes Relative: 9 %
NEUTROS ABS: 2.4 10*3/uL (ref 1.4–6.5)
Neutrophils Relative %: 38 %

## 2015-07-07 LAB — PROTIME-INR
INR: 0.88
PROTHROMBIN TIME: 12.2 s (ref 11.4–15.0)

## 2015-07-07 LAB — VALPROIC ACID LEVEL

## 2015-07-07 LAB — AMMONIA: Ammonia: 23 umol/L (ref 9–35)

## 2015-07-07 LAB — LIPID PANEL
CHOLESTEROL TOTAL: 140 mg/dL (ref 100–199)
Chol/HDL Ratio: 2.8 ratio units (ref 0.0–4.4)
HDL: 50 mg/dL (ref >39)
LDL Calculated: 62 mg/dL (ref 0–99)
TRIGLYCERIDES: 141 mg/dL (ref 0–149)
VLDL Cholesterol Cal: 28 mg/dL (ref 5–40)

## 2015-07-07 LAB — HEMATOCRIT: Hematocrit: 42.8 % (ref 34.0–46.6)

## 2015-07-07 LAB — TROPONIN I

## 2015-07-07 LAB — APTT: APTT: 28 s (ref 24–36)

## 2015-07-07 MED ORDER — TETRACAINE HCL 0.5 % OP SOLN
OPHTHALMIC | Status: AC
  Administered 2015-07-07: 18:00:00
  Filled 2015-07-07: qty 2

## 2015-07-07 NOTE — ED Notes (Addendum)
Pt ambulated to bathroom with assistance. Pt steady with one person assist. Pt does complain of dizziness while ambulating. Pt able to urinate. Pt assisted back in bed.

## 2015-07-07 NOTE — Telephone Encounter (Signed)
Patient requesting refill. 

## 2015-07-07 NOTE — Progress Notes (Unsigned)
Please notify patient that her medications were sent to her pharmacy during her visit.  I will order hgbA1C and also parathyroid scan

## 2015-07-07 NOTE — Consult Note (Addendum)
Referring Physician: Reita Cliche    Chief Complaint: Dizziness  HPI: Samantha Macias is an 55 y.o. female who was with her aide today when she had acute onset drooling and difficulty finding words.  Patient also reported dizziness.  At arrival also complained of difficulty seeing out of the right eye.  Reported everything was yellow out of the right eye and there was a ring of darkness at the edge of the left eye.     Date last known well: 07/07/2015 Time last known well: Time: 12:50 tPA Given: No: Not felt to be a stroke, symptoms improving  Past Medical History  Diagnosis Date  . Allergy   . Anxiety   . Fibromyalgia   . Chronic kidney disease   . Hypertension   . IBS (irritable bowel syndrome)   . Hyperlipidemia     Past Surgical History  Procedure Laterality Date  . Tubal ligation      Family History  Problem Relation Age of Onset  . Peripheral vascular disease Mother   . Anxiety disorder Mother   . Depression Mother   . Thyroid disease Mother   . Congestive Heart Failure Father   . Alcohol abuse Father    Social History:  reports that she has been smoking Cigarettes.  She has a 25 pack-year smoking history. She has never used smokeless tobacco. She reports that she does not drink alcohol or use illicit drugs.  Allergies:  Allergies  Allergen Reactions  . Shellfish Allergy Shortness Of Breath, Anaphylaxis and Nausea And Vomiting    Other reaction(s): Swollen lips  . Ace Inhibitors     cough  . Codeine Hives  . Nitrofurantoin Monohyd Macro Nausea Only  . Other Nausea And Vomiting    Tetanus  . Red Dye Itching  . Tetanus Toxoid Adsorbed     Medications: I have reviewed the patient's current medications. Prior to Admission:  Prior to Admission medications   Medication Sig Start Date End Date Taking? Authorizing Provider  ALPRAZolam Duanne Moron) 0.5 MG tablet Take 1-1.5 tablets (0.5-0.75 mg total) by mouth at bedtime as needed for anxiety. 07/01/15   Steele Sizer, MD   aspirin 81 MG tablet Take 1 tablet by mouth daily.    Historical Provider, MD  atorvastatin (LIPITOR) 40 MG tablet Take 1 tablet (40 mg total) by mouth daily. 07/01/15   Steele Sizer, MD  Cholecalciferol (VITAMIN D) 2000 units tablet Take 2,000 Units by mouth daily.    Historical Provider, MD  diclofenac sodium (VOLTAREN) 1 % GEL Apply 4 g topically 4 (four) times daily as needed. 07/01/15   Steele Sizer, MD  ketoconazole (NIZORAL) 2 % cream Apply 1 application topically daily. 01/26/15   Steele Sizer, MD  losartan-hydrochlorothiazide (HYZAAR) 50-12.5 MG tablet Take 1 tablet by mouth daily. 07/01/15   Steele Sizer, MD  pregabalin (LYRICA) 200 MG capsule Take 1 capsule (200 mg total) by mouth 3 (three) times daily. 07/01/15   Steele Sizer, MD  QUEtiapine (SEROQUEL) 50 MG tablet Take 1-2 tablets (50-100 mg total) by mouth at bedtime. 07/01/15   Steele Sizer, MD  ranitidine (ZANTAC) 150 MG tablet Take 1 tablet (150 mg total) by mouth daily as needed for heartburn. 07/01/15   Steele Sizer, MD  ROZEREM 8 MG tablet TAKE 1 TABLET BY MOUTH EVERY EVENING 05/14/15   Steele Sizer, MD  tiZANidine (ZANAFLEX) 2 MG tablet Take 1 tablet (2 mg total) by mouth every evening. 07/01/15   Steele Sizer, MD  traMADol (ULTRAM) 50 MG  tablet Take 1 tablet (50 mg total) by mouth 3 (three) times daily. 07/01/15   Alba Cory, MD    ROS: History obtained from the patient  General ROS: negative for - chills, fatigue, fever, night sweats, weight gain or weight loss Psychological ROS: negative for - behavioral disorder, hallucinations, memory difficulties, mood swings or suicidal ideation Ophthalmic ROS: visual changes ENT ROS: negative for - epistaxis, nasal discharge, oral lesions, sore throat, tinnitus or vertigo Allergy and Immunology ROS: negative for - hives or itchy/watery eyes Hematological and Lymphatic ROS: negative for - bleeding problems, bruising or swollen lymph nodes Endocrine ROS: negative for -  galactorrhea, hair pattern changes, polydipsia/polyuria or temperature intolerance Respiratory ROS: negative for - cough, hemoptysis, shortness of breath or wheezing Cardiovascular ROS: negative for - chest pain, dyspnea on exertion, edema or irregular heartbeat Gastrointestinal ROS: negative for - abdominal pain, diarrhea, hematemesis, nausea/vomiting or stool incontinence Genito-Urinary ROS: difficulty urinating Musculoskeletal ROS: back pain Neurological ROS: as noted in HPI Dermatological ROS: negative for rash and skin lesion changes  Physical Examination: Blood pressure 155/93, pulse 65, temperature 97.7 F (36.5 C), temperature source Oral, resp. rate 15, height 5\' 8"  (1.727 m), weight 85.4 kg (188 lb 4.4 oz), SpO2 99 %.  HEENT-  Normocephalic, no lesions, without obvious abnormality.  Normal external eye and conjunctiva.  Normal TM's bilaterally.  Normal auditory canals and external ears. Normal external nose, mucus membranes and septum.  Normal pharynx. Cardiovascular- S1, S2 normal, pulses palpable throughout   Lungs- chest clear, no wheezing, rales, normal symmetric air entry Abdomen- soft, non-tender; bowel sounds normal; no masses,  no organomegaly Extremities- no edema Lymph-no adenopathy palpable Musculoskeletal-no joint tenderness, deformity or swelling Skin-warm and dry, no hyperpigmentation, vitiligo, or suspicious lesions  Neurological Examination Mental Status: Alert, oriented, thought content appropriate.  Speech fluent without evidence of aphasia.  Able to follow 3 step commands without difficulty. Cranial Nerves: II: Discs flat bilaterally; Can count fingers in all visual fields but reports being able to only see yellow out of the right eye.  Able to read.  Pupils equal, round, reactive to light and accommodation III,IV, VI: ptosis not present, extra-ocular motions intact bilaterally V,VII: smile symmetric, facial light touch sensation decreased on the left side of  the face VIII: hearing normal bilaterally IX,X: gag reflex present XI: bilateral shoulder shrug XII: midline tongue extension Motor: Right : Upper extremity   5/5    Left:     Upper extremity   5/5  Lower extremity   4-/5     Lower extremity   4-/5 Patient unable to maintain either leg against gravity for long periods of time due to back pain Sensory: Pinprick and light touch decreased in the LLE Deep Tendon Reflexes: 2+ and symmetric throughout Plantars: Right: downgoing   Left: downgoing Cerebellar: Normal finger-to-nose testing.  Difficult to perform heel-to-shin testing due to pain  Gait: not tested due to safety concerns   Laboratory Studies:  Basic Metabolic Panel:  Recent Labs Lab 07/06/15 0905  NA 141  K 4.5  CL 104  CO2 24  GLUCOSE 101*  BUN 16  CREATININE 1.25*  CALCIUM 9.1    Liver Function Tests:  Recent Labs Lab 07/06/15 0905  AST 20  ALT 13  ALKPHOS 107  BILITOT <0.2  PROT 6.4  ALBUMIN 4.0   No results for input(s): LIPASE, AMYLASE in the last 168 hours. No results for input(s): AMMONIA in the last 168 hours.  CBC:  Recent Labs Lab  07/06/15 0905 07/07/15 1358  WBC  --  6.3  NEUTROABS  --  2.4  HGB  --  14.0  HCT 42.8 42.2  MCV  --  90.5  PLT  --  167    Cardiac Enzymes: No results for input(s): CKTOTAL, CKMB, CKMBINDEX, TROPONINI in the last 168 hours.  BNP: Invalid input(s): POCBNP  CBG: No results for input(s): GLUCAP in the last 168 hours.  Microbiology: Results for orders placed or performed in visit on 12/20/14  Urine Culture     Status: None   Collection Time: 12/20/14 12:00 AM  Result Value Ref Range Status   Urine Culture, Routine Final report  Final   Urine Culture result 1 Comment  Final    Comment: Mixed urogenital flora Less than 10,000 colonies/mL     Coagulation Studies: No results for input(s): LABPROT, INR in the last 72 hours.  Urinalysis: No results for input(s): COLORURINE, LABSPEC, PHURINE,  GLUCOSEU, HGBUR, BILIRUBINUR, KETONESUR, PROTEINUR, UROBILINOGEN, NITRITE, LEUKOCYTESUR in the last 168 hours.  Invalid input(s): APPERANCEUR  Lipid Panel:    Component Value Date/Time   CHOL 140 07/06/2015 0905   CHOL 154 04/27/2014   CHOL 148 02/10/2014 0218   TRIG 141 07/06/2015 0905   TRIG 97 02/10/2014 0218   HDL 50 07/06/2015 0905   HDL 68 04/27/2014   HDL 37* 02/10/2014 0218   CHOLHDL 2.8 07/06/2015 0905   VLDL 19 02/10/2014 0218   LDLCALC 62 07/06/2015 0905   LDLCALC 71 04/27/2014   LDLCALC 92 02/10/2014 0218    HgbA1C: No results found for: HGBA1C  Urine Drug Screen:  No results found for: LABOPIA, COCAINSCRNUR, LABBENZ, AMPHETMU, THCU, LABBARB  Alcohol Level: No results for input(s): ETH in the last 168 hours.   Imaging: Ct Head Wo Contrast  07/07/2015  CLINICAL DATA:  CODE STROKE Dr. Marcelene Butte [phone]. HX TIA, C/O weakness, difficulty seeing out of right. Difficulty speaking for weeks. Expressive aphasia. No recent trauma. Stage 4 kidney failure. TKV EXAM: CT HEAD WITHOUT CONTRAST TECHNIQUE: Contiguous axial images were obtained from the base of the skull through the vertex without intravenous contrast. COMPARISON:  MR 02/10/2014 and previous FINDINGS: Brain: Stable mild parenchymal atrophy. No evidence of acute infarction, hemorrhage, extra-axial collection, ventriculomegaly, or mass effect. Vascular: No hyperdense vessel or unexpected calcification. Skull: Negative for fracture or focal lesion. Sinuses/Orbits: No acute findings.  Hypoplastic frontal sinuses. Other: None. IMPRESSION: 1. Stable mild atrophy. No bleed or other acute intracranial process. Critical Value/emergent results were called by telephone at the time of interpretation on 07/07/2015 at 1:57 pm to Dr. Reita Cliche, who verbally acknowledged these results. Electronically Signed   By: Lucrezia Europe M.D.   On: 07/07/2015 14:08    Assessment: 55 y.o. female presenting with a myriad of symptoms of acute onset.  Code  stroke called.  Initial NIHSS of 5.  Symptoms not consistent.  Head CT personally reviewed and shows no acute changes.  Although not characteristic there are many symptoms that suggest posterior circulation involvement.  Patient on ASA at home.  Further work up recommended.     Stroke Risk Factors - hyperlipidemia, hypertension and smoking  Plan: 1. MRI/MRA of the brain without contrast. If MRA unremarkable no further neurological work up recommended at this time.   2. Increase ASA to '325mg'$  daily 3. Depakote level, ammonia 4. Neuro checks  Case discussed with Dr. Orlie Dakin, MD Neurology [phone] 07/07/2015, 2:21 PM

## 2015-07-07 NOTE — ED Provider Notes (Signed)
[facility] Emergency Department Provider Note   ___________________________________________  Time seen: I have reviewed the triage vital signs and the triage nursing note.  HISTORY  Chief Complaint Code Stroke   Historian Patient  HPI Samantha Macias is a 55 y.o. female with a history of prior stroke, fibromyalgia,is here with a complaint of dizziness which started this morning around 1250. Patient wasn't with her nursing aide and she started complaining of dizziness, right eye blurry vision, which she also describes as yellow tint to the vision, and intermittent right arm tingling. No focal weakness.  Patient states that she does get intermittent tingling in her arms and her legs at various points in time.  There is reportedly some mild drooling and trouble with speech although this was better by the time the patient came to the ED. She was started as a code stroke in the field and neurology was at the bedside for evaluation the patient emergently when she arrived to the ED.  Denies headache. denies recent fevers.    Past Medical History  Diagnosis Date  . Allergy   . Anxiety   . Fibromyalgia   . Chronic kidney disease   . Hypertension   . IBS (irritable bowel syndrome)   . Hyperlipidemia     Patient Active Problem List   Diagnosis Date Noted  . Hypertension, benign 07/01/2015  . Hyperparathyroidism, secondary renal ([place]) 12/03/2014  . Allergic rhinitis 12/01/2014  . Anemia in chronic illness 12/01/2014  . GAD (generalized anxiety disorder) 12/01/2014  . Back pain, chronic 12/01/2014  . Bipolar affective disorder, currently depressed, mild ([place]) 12/01/2014  . Atherosclerosis of left carotid artery 12/01/2014  . Carpal tunnel syndrome 12/01/2014  . Chronic kidney disease (CKD), stage III (moderate) 12/01/2014  . Insomnia, persistent 12/01/2014  . Chronic nonmalignant pain 12/01/2014  . CN (constipation) 12/01/2014  . Kidney cysts  12/01/2014  . Cervical osteoarthritis 12/01/2014  . Dyslipidemia 12/01/2014  . Fibromyalgia syndrome 12/01/2014  . Abnormal gait 12/01/2014  . Gastro-esophageal reflux disease without esophagitis 12/01/2014  . History of colon polyps 12/01/2014  . History of syncope 12/01/2014  . H/O transient cerebral ischemia 12/01/2014  . HPV test positive 12/01/2014  . Personal history of fall 12/01/2014  . Calculus of kidney 12/01/2014  . Lumbar radiculopathy 12/01/2014  . OCD (obsessive compulsive disorder) 12/01/2014  . Overweight 12/01/2014  . PTSD (post-traumatic stress disorder) 12/01/2014  . Trochanteric bursitis of both hips 12/01/2014  . 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 Tubal ligation      Current Outpatient Rx  Name  Route  Sig  Dispense  Refill  . ALPRAZolam (XANAX) 0.5 MG tablet   Oral   Take 1-1.5 tablets (0.5-0.75 mg total) by mouth at bedtime as needed for anxiety.   45 tablet   2     Fill Feb 16th and monthly thereafter   . aspirin EC 81 MG tablet   Oral   Take 81 mg by mouth at bedtime.         Marland Kitchen atorvastatin (LIPITOR) 40 MG tablet   Oral   Take 1 tablet (40 mg total) by mouth daily. Patient taking differently: Take 40 mg by mouth at bedtime.    30 tablet   5   . Cholecalciferol (VITAMIN D) 2000 units tablet   Oral   Take 2,000 Units by mouth daily.         . diclofenac sodium (VOLTAREN)  1 % GEL   Topical   Apply 4 g topically 4 (four) times daily as needed. Patient taking differently: Apply 4 g topically 4 (four) times daily as needed (for pain).    100 g   5   . hydrALAZINE (APRESOLINE) 25 MG tablet   Oral   Take 25 mg by mouth 3 (three) times daily. Will stop once patient starts losartan/hydrochlorothiazide         . ketoconazole (NIZORAL) 2 % cream   Topical   Apply 1 application topically daily. Patient taking differently: Apply 1 application topically daily as needed for  irritation.    60 g   0   . pregabalin (LYRICA) 200 MG capsule   Oral   Take 1 capsule (200 mg total) by mouth 3 (three) times daily.   90 capsule   5   . QUEtiapine (SEROQUEL) 50 MG tablet   Oral   Take 1-2 tablets (50-100 mg total) by mouth at bedtime.   60 tablet   0   . ramelteon (ROZEREM) 8 MG tablet   Oral   Take 8 mg by mouth at bedtime.         . ranitidine (ZANTAC) 150 MG tablet   Oral   Take 1 tablet (150 mg total) by mouth daily as needed for heartburn.   30 tablet   2   . tiZANidine (ZANAFLEX) 2 MG tablet   Oral   Take 1 tablet (2 mg total) by mouth every evening. Patient taking differently: Take 2 mg by mouth at bedtime.    90 tablet   1   . traMADol (ULTRAM) 50 MG tablet   Oral   Take 1 tablet (50 mg total) by mouth 3 (three) times daily.   90 tablet   2   . losartan-hydrochlorothiazide (HYZAAR) 50-12.5 MG tablet   Oral   Take 1 tablet by mouth daily. Patient not taking: Reported on 07/07/2015   30 tablet   5     Allergies Shellfish allergy; Ace inhibitors; Codeine; Nitrofurantoin monohyd macro; Other; Red dye; and Tetanus toxoid adsorbed  Family History  Problem Relation Age of Onset  . Peripheral vascular disease Mother   . Anxiety disorder Mother   . Depression Mother   . Thyroid disease Mother   . Congestive Heart Failure Father   . Alcohol abuse Father     Social History Social History  Substance Use Topics  . Smoking status: Current Every Day Smoker -- 1.00 packs/day for 25 years    Types: Cigarettes  . Smokeless tobacco: Never Used  . Alcohol Use: No    Review of Systems  Constitutional: Negative for fever. Eyes: As per history of present illness ENT: Negative for sore throat. Cardiovascular: Negative for chest pain. Respiratory: Negative for shortness of breath. Gastrointestinal: Negative for abdominal pain, vomiting and diarrhea. Genitourinary: Reports stage III kidney disease and usually makes urine just once  today. Musculoskeletal: Negative for back pain. Skin: Negative for rash. Neurological: Negative for headache. 10 point Review of Systems otherwise negative ____________________________________________   PHYSICAL EXAM:  VITAL SIGNS: ED Triage Vitals  Enc Vitals Group     BP 07/07/15 1402 155/93 mmHg     Pulse Rate 07/07/15 1400 63     Resp 07/07/15 1400 13     Temp 07/07/15 1402 97.7 F (36.5 C)     Temp Source 07/07/15 1402 Oral     SpO2 07/07/15 1400 99 %     Weight 07/07/15 1348  188 lb 4.4 oz (85.4 kg)     Height 07/07/15 1402 5\' 8"  (1.727 m)     Head Cir --      Peak Flow --      Pain Score --      Pain Loc --      Pain Edu? --      Excl. in [place]? --      Constitutional: Alert and oriented. Well appearing and in no distress. Eyes: Conjunctivae are normal. PERRL. Normal extraocular movements.Funduscopic exam normal. Eye pressures approximately 14 bilaterally. ENT:     Head: Normocephalic and atraumatic.   Nose: No congestion/rhinnorhea.   Mouth/Throat: Mucous membranes are moist.   Neck: No stridor. Cardiovascular/Chest: Normal rate, regular rhythm.  No murmurs, rubs, or gallops. Respiratory: Normal respiratory effort without tachypnea nor retractions. Breath sounds are clear and equal bilaterally. No wheezes/rales/rhonchi. Gastrointestinal: Soft. No distention, no guarding, no rebound. Nontender.    Genitourinary/rectal:Deferred Musculoskeletal: Nontender with normal range of motion in all extremities. No joint effusions.  No lower extremity tenderness.  No edema. Neurologic:  No aphasia. No facial droop. Normal speech. Normal strength in 4 extremities. Mild sensory tingling complaint right arm. Skin:  Skin is warm, dry and intact. No rash noted. Psychiatric: Mood and affect are normal. Speech and behavior are normal. Patient exhibits appropriate insight and judgment.  ____________________________________________   EKG I, Lisa Roca, MD, the attending  physician have personally viewed and interpreted all ECGs.  67 beats minute. Normal sinus rhythm. Narrow QRS. Normal axis. Normal ST and T-wave ____________________________________________  LABS (pertinent positives/negatives)  Ammonia 23 CBC within normal limits Comprehensive metabolic panel without significant abnormalities Doppler less than 10  ____________________________________________  RADIOLOGY All Xrays were viewed by me. Imaging interpreted by Radiologist.  CT head noncontrast:  IMPRESSION: 1. Stable mild atrophy. No bleed or other acute intracranial process. Critical Value/emergent results were called by telephone at the time of interpretation on 07/07/2015 at 1:57 pm to Dr. Reita Cliche, who verbally acknowledged these results.   MRI/MRA:  MRI HEAD FINDINGS  No acute infarct or intracranial hemorrhage.  Minimal matter changes suggestive of minimal chronic small vessel disease.  No intracranial mass lesion noted on this unenhanced exam.  Mild atrophy without hydrocephalus.  Mild exophthalmos. Orbital structures otherwise unremarkable.  Right C2-3 facet degenerative changes. Congenital narrowing upper cervical canal. Cervical medullary junction unremarkable.  MRA HEAD FINDINGS  Anterior circulation without medium or large size vessel significant stenosis or occlusion.  Mild anterior cerebral artery and middle cerebral artery branch vessel irregularity.  Mild ectasia vertebral arteries and basilar artery. Mild narrowing distal right vertebral artery.  Poor delineation of left posterior inferior cerebellar artery. Small right posterior inferior cerebellar artery.  Nonvisualized anterior inferior cerebellar arteries.  Mild irregularity superior cerebellar arteries.  Mild narrowing proximal posterior cerebral artery bilaterally. Mild to slightly moderate narrowing of portions of the distal posterior cerebral artery branches bilaterally.  IMPRESSION: MRI  HEAD  No acute infarct or intracranial hemorrhage.  Minimal matter changes suggestive of minimal chronic small vessel disease.  No intracranial mass lesion noted on this unenhanced exam.  Mild atrophy without hydrocephalus.  Mild exophthalmos.  MRA HEAD  Intracranial branch vessel irregularity as detailed above. __________________________________________  PROCEDURES  Procedure(s) performed: None  Critical Care performed: None ____________________________________________   ED COURSE / ASSESSMENT AND PLAN  Pertinent labs & imaging results that were available during my care of the patient were reviewed by me and considered in my medical decision making (see chart  for details).    Patient arrived as a code stroke and Dr. Doy Mince, neurology was at the bedside for evaluation. Patient's symptoms is mainly dizziness and some problems with word finding at home and possibly facial droop, although that facial droop and speech problems has resolved. The patient does have some underlying intermittent neurologic/tingling of both arms and both legs, and although she is complaining of some tenting of the right arm, it sounds like this is not necessarily acute.  NIH stroke scale considered 5 by neurologist. Although patient's symptoms are improving and it was decided that risk versus benefit was not adequate for recommending tPA.  MRI and MRI were obtained per neurology recommendation and show no stroke or significant finding. From a neurology standpoint, patient was recommended for discharge.  On patient evaluation she is actually improved at this point time. I am going to have her go ahead and follow-up with her primary care physician as well as a her eye doctor given some of the complaints seem to be based on vision.  Her vision is better upon discharge. Symptoms are better upon discharge.  Patient increase aspirin 81 mg daily to 325 daily.   CONSULTATIONS:   Face-to-face with  neurologist, Dr. Doy Mince.   Patient / Family / Caregiver informed of clinical course, medical decision-making process, and agree with plan.   I discussed return precautions, follow-up instructions, and discharged instructions with patient and/or family.   ___________________________________________   FINAL CLINICAL IMPRESSION(S) / ED DIAGNOSES   Final diagnoses:  Dizziness, nonspecific              Note: This dictation was prepared with Dragon dictation. Any transcriptional errors that result from this process are unintentional   Lisa Roca, MD 07/07/15 1801

## 2015-07-07 NOTE — ED Notes (Signed)
Patient to MRI for MRA

## 2015-07-07 NOTE — ED Notes (Signed)
Per [facility]: patient was with nursing aide and was discovered to have sudden onset of drooling and difficulty finding words, dizziness..  This was noted at 1250.  C/o nonspecific difficulty seeing out of right eye.  Code stroke called, patient transported immediately to CT with EMS where neurology met and assessed patient.   

## 2015-07-07 NOTE — ED Notes (Signed)
Stroke nurse comments:  Pt arrived via [facility], pt has multiple complaints, pt's aid states LKW 1300.  Co dizziness, rt eye droop, rt side of body has decreased sensation, slurred speech.  Prior HX of TIA.  NIHSS 5. Per Dr. Doy Mince, no acute treatment at this time, keep pt in window until 1730, VS Q 15 min, and Neuro Q 30 until outside of window.  Primary nurse Erline Levine RN aware of plan.

## 2015-07-07 NOTE — Discharge Instructions (Signed)
You were evaluated for dizziness and intermittent tingling, and your exam and evaluation are reassuring.  We discussed, he should follow-up with the primary care doctor next week. We also discussed I would recommend he follow up with your eye doctor.  Return to the emergency department for any worsening condition including any new confusion or altered mental status, slurred speech, new weakness or numbness, new vision problems including blurry vision, double vision or loss of vision.  We discussed you may want to try an elimination diet for inflammation, such as "Paleo" or "Whole 30", you may look this up on line.   Dizziness Dizziness is a common problem. It is a feeling of unsteadiness or light-headedness. You may feel like you are about to faint. Dizziness can lead to injury if you stumble or fall. Anyone can become dizzy, but dizziness is more common in older adults. This condition can be caused by a number of things, including medicines, dehydration, or illness. HOME CARE INSTRUCTIONS Taking these steps may help with your condition: Eating and Drinking  Drink enough fluid to keep your urine clear or pale yellow. This helps to keep you from becoming dehydrated. Try to drink more clear fluids, such as water.  Do not drink alcohol.  Limit your caffeine intake if directed by your health care provider.  Limit your salt intake if directed by your health care provider. Activity  Avoid making quick movements.  Rise slowly from chairs and steady yourself until you feel okay.  In the morning, first sit up on the side of the bed. When you feel okay, stand slowly while you hold onto something until you know that your balance is fine.  Move your legs often if you need to stand in one place for a long time. Tighten and relax your muscles in your legs while you are standing.  Do not drive or operate heavy machinery if you feel dizzy.  Avoid bending down if you feel dizzy. Place items in your  home so that they are easy for you to reach without leaning over. Lifestyle  Do not use any tobacco products, including cigarettes, chewing tobacco, or electronic cigarettes. If you need help quitting, ask your health care provider.  Try to reduce your stress level, such as with yoga or meditation. Talk with your health care provider if you need help. General Instructions  Watch your dizziness for any changes.  Take medicines only as directed by your health care provider. Talk with your health care provider if you think that your dizziness is caused by a medicine that you are taking.  Tell a friend or a family member that you are feeling dizzy. If he or she notices any changes in your behavior, have this person call your health care provider.  Keep all follow-up visits as directed by your health care provider. This is important. SEEK MEDICAL CARE IF:  Your dizziness does not go away.  Your dizziness or light-headedness gets worse.  You feel nauseous.  You have reduced hearing.  You have new symptoms.  You are unsteady on your feet or you feel like the room is spinning. SEEK IMMEDIATE MEDICAL CARE IF:  You vomit or have diarrhea and are unable to eat or drink anything.  You have problems talking, walking, swallowing, or using your arms, hands, or legs.  You feel generally weak.  You are not thinking clearly or you have trouble forming sentences. It may take a friend or family member to notice this.  You have  chest pain, abdominal pain, shortness of breath, or sweating.  Your vision changes.  You notice any bleeding.  You have a headache.  You have neck pain or a stiff neck.  You have a fever.   This information is not intended to replace advice given to you by your health care provider. Make sure you discuss any questions you have with your health care provider.   Document Released: 11/14/2000 Document Revised: 10/05/2014 Document Reviewed: 05/17/2014 Elsevier  Interactive Patient Education [employer].

## 2015-07-07 NOTE — ED Notes (Signed)
Patient transported to MRI 

## 2015-07-12 ENCOUNTER — Ambulatory Visit: Payer: Medicare Other | Admitting: Family Medicine

## 2015-07-12 ENCOUNTER — Other Ambulatory Visit: Payer: Self-pay

## 2015-07-12 DIAGNOSIS — M549 Dorsalgia, unspecified: Principal | ICD-10-CM

## 2015-07-12 DIAGNOSIS — G8929 Other chronic pain: Secondary | ICD-10-CM

## 2015-07-12 LAB — HGB A1C W/O EAG: HEMOGLOBIN A1C: 5.6 % (ref 4.8–5.6)

## 2015-07-12 LAB — SPECIMEN STATUS REPORT

## 2015-07-12 MED ORDER — TIZANIDINE HCL 2 MG PO TABS: 2.0000 mg | ORAL_TABLET | Freq: Every day | ORAL | Status: DC

## 2015-07-12 NOTE — Telephone Encounter (Signed)
Patient requesting refill. 

## 2015-07-14 ENCOUNTER — Encounter: Payer: Self-pay | Admitting: Family Medicine

## 2015-07-22 ENCOUNTER — Ambulatory Visit: Payer: Medicare Other

## 2015-07-26 ENCOUNTER — Other Ambulatory Visit: Payer: Self-pay | Admitting: Family Medicine

## 2015-07-26 NOTE — Telephone Encounter (Signed)
Patient requesting refill. 

## 2015-08-01 ENCOUNTER — Ambulatory Visit (INDEPENDENT_AMBULATORY_CARE_PROVIDER_SITE_OTHER): Payer: Medicare Other | Admitting: Family Medicine

## 2015-08-01 ENCOUNTER — Encounter: Payer: Self-pay | Admitting: Family Medicine

## 2015-08-01 VITALS — BP 128/90 | HR 100 | Temp 97.0°F | Resp 18 | Wt 183.6 lb

## 2015-08-01 DIAGNOSIS — G4700 Insomnia, unspecified: Secondary | ICD-10-CM | POA: Diagnosis not present

## 2015-08-01 DIAGNOSIS — M25552 Pain in left hip: Secondary | ICD-10-CM

## 2015-08-01 DIAGNOSIS — R413 Other amnesia: Secondary | ICD-10-CM | POA: Diagnosis not present

## 2015-08-01 DIAGNOSIS — M25551 Pain in right hip: Secondary | ICD-10-CM | POA: Diagnosis not present

## 2015-08-01 DIAGNOSIS — M797 Fibromyalgia: Secondary | ICD-10-CM

## 2015-08-01 DIAGNOSIS — M19041 Primary osteoarthritis, right hand: Secondary | ICD-10-CM | POA: Diagnosis not present

## 2015-08-01 DIAGNOSIS — M25562 Pain in left knee: Secondary | ICD-10-CM

## 2015-08-01 DIAGNOSIS — M7062 Trochanteric bursitis, left hip: Secondary | ICD-10-CM | POA: Diagnosis not present

## 2015-08-01 DIAGNOSIS — F3131 Bipolar disorder, current episode depressed, mild: Secondary | ICD-10-CM | POA: Diagnosis not present

## 2015-08-01 DIAGNOSIS — M25561 Pain in right knee: Secondary | ICD-10-CM | POA: Diagnosis not present

## 2015-08-01 DIAGNOSIS — M19042 Primary osteoarthritis, left hand: Secondary | ICD-10-CM

## 2015-08-01 DIAGNOSIS — I10 Essential (primary) hypertension: Secondary | ICD-10-CM | POA: Diagnosis not present

## 2015-08-01 MED ORDER — LOSARTAN POTASSIUM-HCTZ 50-12.5 MG PO TABS: 1.0000 | ORAL_TABLET | Freq: Every day | ORAL | Status: DC

## 2015-08-01 MED ORDER — ASPIRIN EC 325 MG PO TBEC: 325.0000 mg | DELAYED_RELEASE_TABLET | Freq: Every day | ORAL | Status: DC

## 2015-08-01 MED ORDER — LIDOCAINE-EPINEPHRINE 1 %-1:100000 IJ SOLN: 2.0000 mL | Freq: Once | INTRAMUSCULAR | Status: DC

## 2015-08-01 MED ORDER — DICLOFENAC SODIUM 1 % TD GEL: 4.0000 g | Freq: Four times a day (QID) | TRANSDERMAL | Status: DC | PRN

## 2015-08-01 MED ORDER — LIDOCAINE HCL (PF) 1 % IJ SOLN
2.0000 mL | Freq: Once | INTRAMUSCULAR | Status: AC
  Administered 2015-08-01: 2 mL via INTRADERMAL

## 2015-08-01 MED ORDER — TRIAMCINOLONE ACETONIDE 40 MG/ML IJ SUSP
40.0000 mg | Freq: Once | INTRAMUSCULAR | Status: AC
  Administered 2015-08-01: 40 mg via INTRA_ARTICULAR

## 2015-08-01 NOTE — Addendum Note (Signed)
Addended by: Inda Coke on: 08/01/2015 02:44 PM   Modules accepted: Orders

## 2015-08-01 NOTE — Progress Notes (Signed)
Name: Samantha Macias   MRN: [id number]    DOB: 07/30/1960   Date:08/01/2015       Progress Note  Subjective  Chief Complaint  Chief Complaint  Patient presents with  . Follow-up    patient stated that she has decreased her smoking.  . Back Pain    mid to low back pain  . Hip Pain    left hip pain that radiates down pass the knee  . Hand Pain    bilateral hand pain  . Dysphagia    patient stated that she has been having a hard time swallowing    HPI  FMS: she has a long history of FMS, pain is getting worse, using her walker, pain all over her body. Also has mental fogginess, lack of energy.   Trochanteric Bursa: she states left hip is very painful over the past couple of weeks, and severe last night. She would like to have another steroid injections. She woke up last night with severe pain  Bilateral hand pain: she states she has a long history of pain on both hands, and carpal tunnel, but all her joints on her hands are painful now, stiff when she wakes up, no swelling or erythema. She states it was better when she had Voltaren gel, but she has been out because of need of PA  Dysphagia: she states she has intermittent episodes of dysphagia, but worse over the past couple of weeks  EC visit /memory changes: she would like to see a Neurologist. She has difficulty swallowing, also confusion and had to go to [facility], taking aspirin 325 mg daily instead of 81 mg. She had a MRI/MRA that showed mild atrophy, no acute changes. Advised to have further follow up as outpatient.   Bipolar Disorder: taking Seroquel, states feels confused at night with medication. Still has mood swings, episodes of feeling depressed. Willing to see Psychiatrist now.    Patient Active Problem List   Diagnosis Date Noted  . Osteoarthritis of both hands 08/01/2015  . Hypertension, benign 07/01/2015  . Hyperparathyroidism, secondary renal ([place]) 12/03/2014  . Allergic rhinitis 12/01/2014  . Anemia in chronic  illness 12/01/2014  . GAD (generalized anxiety disorder) 12/01/2014  . Back pain, chronic 12/01/2014  . Bipolar affective disorder, currently depressed, mild ([place]) 12/01/2014  . Atherosclerosis of left carotid artery 12/01/2014  . Carpal tunnel syndrome 12/01/2014  . Chronic kidney disease (CKD), stage III (moderate) 12/01/2014  . Insomnia, persistent 12/01/2014  . Chronic nonmalignant pain 12/01/2014  . CN (constipation) 12/01/2014  . Kidney cysts 12/01/2014  . Cervical osteoarthritis 12/01/2014  . Dyslipidemia 12/01/2014  . Fibromyalgia syndrome 12/01/2014  . Abnormal gait 12/01/2014  . Gastro-esophageal reflux disease without esophagitis 12/01/2014  . History of colon polyps 12/01/2014  . History of syncope 12/01/2014  . H/O transient cerebral ischemia 12/01/2014  . HPV test positive 12/01/2014  . Personal history of fall 12/01/2014  . Calculus of kidney 12/01/2014  . Lumbar radiculopathy 12/01/2014  . OCD (obsessive compulsive disorder) 12/01/2014  . Overweight 12/01/2014  . PTSD (post-traumatic stress disorder) 12/01/2014  . Trochanteric bursitis of both hips 12/01/2014  . 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 Tubal ligation      Family History  Problem Relation Age of Onset  . Peripheral vascular disease Mother   . Anxiety disorder Mother   . Depression Mother   . Thyroid disease Mother   . Congestive Heart Failure Father   .  Alcohol abuse Father     Social History   Social History  . Marital Status: Single    Spouse Name: N/A  . Number of Children: 2  . Years of Education: N/A   Occupational History  . Not on file.   Social History Main Topics  . Smoking status: Current Every Day Smoker -- 1.00 packs/day for 25 years    Types: Cigarettes  . Smokeless tobacco: Never Used  . Alcohol Use: No  . Drug Use: No  . Sexual Activity: Not Currently   Other Topics Concern  . Not on file   Social  History Narrative     Current outpatient prescriptions:  .  ALPRAZolam (XANAX) 0.5 MG tablet, Take 1-1.5 tablets (0.5-0.75 mg total) by mouth at bedtime as needed for anxiety., Disp: 45 tablet, Rfl: 2 .  aspirin EC 81 MG tablet, Take 81 mg by mouth at bedtime., Disp: , Rfl:  .  atorvastatin (LIPITOR) 40 MG tablet, TAKE 1 TABLET(40 MG) BY MOUTH DAILY, Disp: 90 tablet, Rfl: 1 .  Cholecalciferol (VITAMIN D) 2000 units tablet, Take 2,000 Units by mouth daily., Disp: , Rfl:  .  diclofenac sodium (VOLTAREN) 1 % GEL, Apply 4 g topically 4 (four) times daily as needed., Disp: 100 g, Rfl: 5 .  hydrALAZINE (APRESOLINE) 25 MG tablet, Take 25 mg by mouth 3 (three) times daily. Will stop once patient starts losartan/hydrochlorothiazide, Disp: , Rfl:  .  losartan-hydrochlorothiazide (HYZAAR) 50-12.5 MG tablet, Take 1 tablet by mouth daily., Disp: 90 tablet, Rfl: 1 .  pregabalin (LYRICA) 200 MG capsule, Take 1 capsule (200 mg total) by mouth 3 (three) times daily., Disp: 90 capsule, Rfl: 5 .  QUEtiapine (SEROQUEL) 50 MG tablet, Take 1-2 tablets (50-100 mg total) by mouth at bedtime., Disp: 60 tablet, Rfl: 0 .  ramelteon (ROZEREM) 8 MG tablet, Take 8 mg by mouth at bedtime., Disp: , Rfl:  .  ranitidine (ZANTAC) 150 MG tablet, Take 1 tablet (150 mg total) by mouth daily as needed for heartburn., Disp: 30 tablet, Rfl: 2 .  tiZANidine (ZANAFLEX) 2 MG tablet, Take 1 tablet (2 mg total) by mouth at bedtime., Disp: 90 tablet, Rfl: 1 .  traMADol (ULTRAM) 50 MG tablet, Take 1 tablet (50 mg total) by mouth 3 (three) times daily., Disp: 90 tablet, Rfl: 2  Allergies  Allergen Reactions  . Shellfish Allergy Shortness Of Breath, Anaphylaxis and Nausea And Vomiting    Other reaction(s): Swollen lips  . Ace Inhibitors     cough  . Codeine Hives  . Nitrofurantoin Monohyd Macro Nausea Only  . Other Nausea And Vomiting    Tetanus  . Red Dye Itching  . Tetanus Toxoid Adsorbed      ROS  Constitutional: Negative for  fever, positive for  weight change.  Respiratory: Negative for cough , positive for  shortness of breath intermittent, she will follow up with cardiologist.   Cardiovascular: Negative for chest pain or palpitations.  Gastrointestinal: Negative for abdominal pain, no bowel changes.  Musculoskeletal: Positive  for gait problem ( using walker )or joint swelling.  Skin: Negative for rash.  Neurological: Negative for dizziness or headache.  No other specific complaints in a complete review of systems (except as listed in HPI above).  Objective  Filed Vitals:   08/01/15 0958  BP: 128/90  Pulse: 100  Temp: 97 F (36.1 C)  TempSrc: Oral  Resp: 18  Weight: 183 lb 9.6 oz (83.28 kg)  SpO2: 96%  Body mass index is 27.92 kg/(m^2).  Physical Exam  Constitutional: Patient appears well-developed and well-nourished. Obese No distress.  HEENT: head atraumatic, normocephalic, pupils equal and reactive to light, neck supple, throat within normal limits Cardiovascular: Normal rate, regular rhythm and normal heart sounds.  No murmur heard. No BLE edema. Pulmonary/Chest: Effort normal and breath sounds normal. No respiratory distress. Abdominal: Soft.  There is no tenderness. Psychiatric: Patient has a normal mood and affect.  Judgment and thought content normal. She is more calm today, still has flight of ideas, but does not seem manic today  Recent Results (from the past 2160 hour(s))  Lipid panel     Status: None   Collection Time: 07/06/15  9:05 AM  Result Value Ref Range   Cholesterol, Total 140 100 - 199 mg/dL   Triglycerides 141 0 - 149 mg/dL   HDL 50 >39 mg/dL   VLDL Cholesterol Cal 28 5 - 40 mg/dL   LDL Calculated 62 0 - 99 mg/dL   Chol/HDL Ratio 2.8 0.0 - 4.4 ratio units    Comment:                                   T. Chol/HDL Ratio                                             Men  Women                               1/2 Avg.Risk  3.4    3.3                                    Avg.Risk  5.0    4.4                                2X Avg.Risk  9.6    7.1                                3X Avg.Risk 23.4   11.0   Hematocrit     Status: None   Collection Time: 07/06/15  9:05 AM  Result Value Ref Range   Hematocrit 42.8 34.0 - 46.6 %  Comprehensive Metabolic Panel (CMET)     Status: Abnormal   Collection Time: 07/06/15  9:05 AM  Result Value Ref Range   Glucose 101 (H) 65 - 99 mg/dL   BUN 16 6 - 24 mg/dL   Creatinine, Ser 1.25 (H) 0.57 - 1.00 mg/dL   GFR calc non Af Amer 49 (L) >59 mL/min/1.73   GFR calc Af Amer 57 (L) >59 mL/min/1.73   BUN/Creatinine Ratio 13 9 - 23   Sodium 141 134 - 144 mmol/L   Potassium 4.5 3.5 - 5.2 mmol/L   Chloride 104 96 - 106 mmol/L   CO2 24 18 - 29 mmol/L   Calcium 9.1 8.7 - 10.2 mg/dL   Total Protein 6.4 6.0 - 8.5 g/dL   Albumin 4.0 3.5 - 5.5 g/dL   Globulin, Total 2.4 1.5 - 4.5  g/dL   Albumin/Globulin Ratio 1.7 1.1 - 2.5   Bilirubin Total <0.2 0.0 - 1.2 mg/dL   Alkaline Phosphatase 107 39 - 117 IU/L   AST 20 0 - 40 IU/L   ALT 13 0 - 32 IU/L  Parathyroid hormone, intact (no Ca)     Status: Abnormal   Collection Time: 07/06/15  9:05 AM  Result Value Ref Range   PTH 67 (H) 15 - 65 pg/mL  Hgb A1c w/o eAG     Status: None   Collection Time: 07/06/15  9:05 AM  Result Value Ref Range   Hgb A1c MFr Bld 5.6 4.8 - 5.6 %    Comment:          Pre-diabetes: 5.7 - 6.4          Diabetes: >6.4          Glycemic control for adults with diabetes: <7.0   Specimen status report     Status: None   Collection Time: 07/06/15  9:05 AM  Result Value Ref Range   specimen status report Comment     Comment: Written Authorization Written Authorization Written Authorization Received. Authorization received from [place] 07-12-2015 Logged by Lenice Llamas   Protime-INR     Status: None   Collection Time: 07/07/15  1:58 PM  Result Value Ref Range   Prothrombin Time 12.2 11.4 - 15.0 seconds   INR 0.88   APTT     Status: None    Collection Time: 07/07/15  1:58 PM  Result Value Ref Range   aPTT 28 24 - 36 seconds  CBC     Status: None   Collection Time: 07/07/15  1:58 PM  Result Value Ref Range   WBC 6.3 3.6 - 11.0 K/uL   RBC 4.66 3.80 - 5.20 MIL/uL   Hemoglobin 14.0 12.0 - 16.0 g/dL   HCT 42.2 35.0 - 47.0 %   MCV 90.5 80.0 - 100.0 fL   MCH 30.0 26.0 - 34.0 pg   MCHC 33.1 32.0 - 36.0 g/dL   RDW 12.9 11.5 - 14.5 %   Platelets 167 150 - 440 K/uL  Differential     Status: None   Collection Time: 07/07/15  1:58 PM  Result Value Ref Range   Neutrophils Relative % 38 %   Neutro Abs 2.4 1.4 - 6.5 K/uL   Lymphocytes Relative 50 %   Lymphs Abs 3.1 1.0 - 3.6 K/uL   Monocytes Relative 9 %   Monocytes Absolute 0.5 0.2 - 0.9 K/uL   Eosinophils Relative 4 %   Eosinophils Absolute 0.2 0 - 0.7 K/uL   Basophils Relative 1 %   Basophils Absolute 0.0 0 - 0.1 K/uL  Comprehensive metabolic panel     Status: Abnormal   Collection Time: 07/07/15  1:58 PM  Result Value Ref Range   Sodium 140 135 - 145 mmol/L   Potassium 4.8 3.5 - 5.1 mmol/L   Chloride 107 101 - 111 mmol/L   CO2 27 22 - 32 mmol/L   Glucose, Bld 85 65 - 99 mg/dL   BUN 23 (H) 6 - 20 mg/dL   Creatinine, Ser 1.07 (H) 0.44 - 1.00 mg/dL   Calcium 9.1 8.9 - 10.3 mg/dL   Total Protein 6.8 6.5 - 8.1 g/dL   Albumin 3.8 3.5 - 5.0 g/dL   AST 20 15 - 41 U/L   ALT 13 (L) 14 - 54 U/L   Alkaline Phosphatase 87 38 - 126 U/L  Total Bilirubin 0.5 0.3 - 1.2 mg/dL   GFR calc non Af Amer 58 (L) >60 mL/min   GFR calc Af Amer >60 >60 mL/min    Comment: (NOTE) The eGFR has been calculated using the CKD EPI equation. This calculation has not been validated in all clinical situations. eGFR's persistently <60 mL/min signify possible Chronic Kidney Disease.    Anion gap 6 5 - 15  Valproic acid level     Status: Abnormal   Collection Time: 07/07/15  1:58 PM  Result Value Ref Range   Valproic Acid Lvl <10 (L) 50.0 - 100.0 ug/mL    Comment: RESULT CONFIRMED BY MANUAL  DILUTION  Troponin I     Status: None   Collection Time: 07/07/15  2:08 PM  Result Value Ref Range   Troponin I <0.03 <0.031 ng/mL    Comment:        NO INDICATION OF MYOCARDIAL INJURY.   Ammonia     Status: None   Collection Time: 07/07/15  4:12 PM  Result Value Ref Range   Ammonia 23 9 - 35 umol/L     PHQ2/9: Depression screen [facility] 2/9 08/01/2015 03/28/2015 12/20/2014  Decreased Interest 0 0 0  Down, Depressed, Hopeless 1 0 0  PHQ - 2 Score 1 0 0     Fall Risk: Fall Risk  08/01/2015 06/20/2015 03/28/2015 12/20/2014  Falls in the past year? Yes No Yes Yes  Number falls in past yr: 2 or more - 2 or more 2 or more  Injury with Fall? Yes - Yes No  Risk Factor Category  High Fall Risk - - -  Risk for fall due to : History of fall(s);Impaired mobility;Impaired balance/gait - - -  Follow up Falls evaluation completed;Falls prevention discussed - - -      Functional Status Survey: Is the patient deaf or have difficulty hearing?: No Does the patient have difficulty seeing, even when wearing glasses/contacts?: Yes (glasses) Does the patient have difficulty concentrating, remembering, or making decisions?: No Does the patient have difficulty walking or climbing stairs?: Yes (walks with a cane) Does the patient have difficulty dressing or bathing?: No Does the patient have difficulty doing errands alone such as visiting a doctor's office or shopping?: No    Assessment & Plan  1. Fibromyalgia syndrome  Resume Voltaren  2. Insomnia, persistent  She states she does not like Seroquel, but explained it is important for her bipolar disorder that seems to be better controlled  3. Bipolar affective disorder, currently depressed, mild (HCC)  Continue Seroquel low dose for sleep, needs to follow up with psychiatrist, she has failed or had side effects to multiple medications : Depakote, Abilify and now does not like Seroquel  -referral to psychiatrist  4. Hypertension, benign  -  losartan-hydrochlorothiazide (HYZAAR) 50-12.5 MG tablet; Take 1 tablet by mouth daily.  Dispense: 90 tablet; Refill: 1  5. Primary osteoarthritis of both hands  We will try PA for Voltaren - diclofenac sodium (VOLTAREN) 1 % GEL; Apply 4 g topically 4 (four) times daily as needed.  Dispense: 100 g; Refill: 5  6. Trochanteric bursitis of left hip  Consent form signed Localized bursa - left trochanteric bursa Area prepped with alcohol  Injection with lidocaine 1% and Kenalog 68m/1 ml on bursa sack Patient tolerated procedure well No side effects - lidocaine-EPINEPHrine (XYLOCAINE W/EPI) 1 %-1:100000 (with pres) injection 2 mL; 2 mLs by Other route once. - triamcinolone acetonide (KENALOG-40) injection 40 mg; Inject 1 mL (40  mg total) into the articular space once.  7. Memory loss  - Ambulatory referral to Neurology

## 2015-08-08 ENCOUNTER — Ambulatory Visit: Payer: Medicare Other

## 2015-08-11 ENCOUNTER — Ambulatory Visit: Payer: Medicare Other

## 2015-08-18 ENCOUNTER — Other Ambulatory Visit: Payer: Self-pay | Admitting: Family Medicine

## 2015-08-18 ENCOUNTER — Ambulatory Visit
Admission: RE | Admit: 2015-08-18 | Discharge: 2015-08-18 | Disposition: A | Discharge status: Home / Self Care | Payer: Medicare Other | Source: Ambulatory Visit | Attending: Family Medicine | Admitting: Family Medicine

## 2015-08-18 DIAGNOSIS — R7989 Other specified abnormal findings of blood chemistry: Secondary | ICD-10-CM

## 2015-08-18 DIAGNOSIS — E042 Nontoxic multinodular goiter: Secondary | ICD-10-CM | POA: Insufficient documentation

## 2015-08-18 DIAGNOSIS — E010 Iodine-deficiency related diffuse (endemic) goiter: Secondary | ICD-10-CM | POA: Insufficient documentation

## 2015-08-18 DIAGNOSIS — R591 Generalized enlarged lymph nodes: Secondary | ICD-10-CM | POA: Diagnosis not present

## 2015-08-18 DIAGNOSIS — E041 Nontoxic single thyroid nodule: Secondary | ICD-10-CM

## 2015-08-19 ENCOUNTER — Telehealth: Payer: Self-pay

## 2015-08-22 NOTE — Telephone Encounter (Signed)
Erroneous error

## 2015-08-30 ENCOUNTER — Encounter: Payer: Self-pay | Admitting: Psychiatry

## 2015-08-30 ENCOUNTER — Ambulatory Visit (INDEPENDENT_AMBULATORY_CARE_PROVIDER_SITE_OTHER): Payer: 59 | Admitting: Psychiatry

## 2015-08-30 VITALS — BP 80/62 | HR 76 | Temp 98.5°F

## 2015-08-30 DIAGNOSIS — F411 Generalized anxiety disorder: Secondary | ICD-10-CM

## 2015-08-30 DIAGNOSIS — F329 Major depressive disorder, single episode, unspecified: Secondary | ICD-10-CM

## 2015-08-30 DIAGNOSIS — F32A Depression, unspecified: Secondary | ICD-10-CM
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
## 2015-09-05 ENCOUNTER — Ambulatory Visit: Payer: Medicare Other | Admitting: Family Medicine

## 2015-10-05 ENCOUNTER — Encounter: Payer: Self-pay | Admitting: Family Medicine

## 2015-10-08 ENCOUNTER — Other Ambulatory Visit: Payer: Self-pay | Admitting: Family Medicine

## 2015-10-27 ENCOUNTER — Ambulatory Visit: Payer: Medicare Other | Admitting: Psychiatry

## 2015-11-08 ENCOUNTER — Other Ambulatory Visit: Payer: Self-pay | Admitting: Family Medicine

## 2015-11-08 ENCOUNTER — Encounter: Payer: Self-pay | Admitting: Family Medicine

## 2015-11-08 NOTE — Telephone Encounter (Signed)
Patient requesting refill. 

## 2015-11-09 NOTE — Telephone Encounter (Signed)
Patient requesting refill. 

## 2015-11-15 ENCOUNTER — Telehealth: Payer: Self-pay

## 2015-11-15 NOTE — Telephone Encounter (Signed)
Pharmacist from [facility] called to get a verbal for the Xananx rx that was printed on 11/08/15.

## 2015-11-28 ENCOUNTER — Emergency Department: Payer: Medicare Other

## 2015-11-28 ENCOUNTER — Emergency Department
Admission: EM | Admit: 2015-11-28 | Discharge: 2015-11-28 | Disposition: A | Discharge status: Home / Self Care | Payer: Medicare Other | Attending: Emergency Medicine | Admitting: Emergency Medicine

## 2015-11-28 DIAGNOSIS — S9031XA Contusion of right foot, initial encounter: Secondary | ICD-10-CM | POA: Diagnosis not present

## 2015-11-28 DIAGNOSIS — F17210 Nicotine dependence, cigarettes, uncomplicated: Secondary | ICD-10-CM | POA: Insufficient documentation

## 2015-11-28 DIAGNOSIS — S7002XA Contusion of left hip, initial encounter: Secondary | ICD-10-CM | POA: Diagnosis not present

## 2015-11-28 DIAGNOSIS — Y999 Unspecified external cause status: Secondary | ICD-10-CM | POA: Insufficient documentation

## 2015-11-28 DIAGNOSIS — F3131 Bipolar disorder, current episode depressed, mild: Secondary | ICD-10-CM | POA: Insufficient documentation

## 2015-11-28 DIAGNOSIS — M19041 Primary osteoarthritis, right hand: Secondary | ICD-10-CM | POA: Diagnosis not present

## 2015-11-28 DIAGNOSIS — M25552 Pain in left hip: Secondary | ICD-10-CM | POA: Diagnosis not present

## 2015-11-28 DIAGNOSIS — S8002XA Contusion of left knee, initial encounter: Secondary | ICD-10-CM | POA: Insufficient documentation

## 2015-11-28 DIAGNOSIS — R1010 Upper abdominal pain, unspecified: Secondary | ICD-10-CM | POA: Diagnosis not present

## 2015-11-28 DIAGNOSIS — Y939 Activity, unspecified: Secondary | ICD-10-CM | POA: Insufficient documentation

## 2015-11-28 DIAGNOSIS — S99921A Unspecified injury of right foot, initial encounter: Secondary | ICD-10-CM | POA: Diagnosis not present

## 2015-11-28 DIAGNOSIS — M19042 Primary osteoarthritis, left hand: Secondary | ICD-10-CM | POA: Diagnosis not present

## 2015-11-28 DIAGNOSIS — Y929 Unspecified place or not applicable: Secondary | ICD-10-CM | POA: Insufficient documentation

## 2015-11-28 DIAGNOSIS — W182XXA Fall in (into) shower or empty bathtub, initial encounter: Secondary | ICD-10-CM | POA: Diagnosis not present

## 2015-11-28 DIAGNOSIS — S8992XA Unspecified injury of left lower leg, initial encounter: Secondary | ICD-10-CM | POA: Diagnosis not present

## 2015-11-28 DIAGNOSIS — S20212A Contusion of left front wall of thorax, initial encounter: Secondary | ICD-10-CM

## 2015-11-28 DIAGNOSIS — E785 Hyperlipidemia, unspecified: Secondary | ICD-10-CM | POA: Diagnosis not present

## 2015-11-28 DIAGNOSIS — M79671 Pain in right foot: Secondary | ICD-10-CM | POA: Diagnosis not present

## 2015-11-28 DIAGNOSIS — M47812 Spondylosis without myelopathy or radiculopathy, cervical region: Secondary | ICD-10-CM | POA: Insufficient documentation

## 2015-11-28 DIAGNOSIS — I2510 Atherosclerotic heart disease of native coronary artery without angina pectoris: Secondary | ICD-10-CM | POA: Insufficient documentation

## 2015-11-28 DIAGNOSIS — M25562 Pain in left knee: Secondary | ICD-10-CM | POA: Diagnosis not present

## 2015-11-28 DIAGNOSIS — S7012XA Contusion of left thigh, initial encounter: Secondary | ICD-10-CM | POA: Insufficient documentation

## 2015-11-28 DIAGNOSIS — S299XXA Unspecified injury of thorax, initial encounter: Secondary | ICD-10-CM | POA: Diagnosis not present

## 2015-11-28 HISTORY — DX: Atherosclerotic heart disease of native coronary artery without angina pectoris: I25.10

## 2015-11-28 HISTORY — DX: Unspecified abnormalities of gait and mobility: R26.9

## 2015-11-28 HISTORY — DX: Disorder of kidney and ureter, unspecified: N28.9

## 2015-11-28 HISTORY — DX: Spinal stenosis, site unspecified: M48.00

## 2015-11-28 MED ORDER — OXYCODONE-ACETAMINOPHEN 5-325 MG PO TABS: 1.0000 | ORAL_TABLET | ORAL | Status: DC | PRN

## 2015-11-28 MED ORDER — IBUPROFEN 600 MG PO TABS: 600.0000 mg | ORAL_TABLET | Freq: Four times a day (QID) | ORAL | Status: DC | PRN

## 2015-11-28 MED ORDER — HYDROMORPHONE HCL 1 MG/ML IJ SOLN
1.0000 mg | Freq: Once | INTRAMUSCULAR | Status: AC
  Administered 2015-11-28: 1 mg via INTRAMUSCULAR
  Filled 2015-11-28: qty 1

## 2015-11-28 NOTE — Discharge Instructions (Signed)
Contusion A contusion is a deep bruise. Contusions happen when an injury causes bleeding under the skin. Symptoms of bruising include pain, swelling, and discolored skin. The skin may turn blue, purple, or yellow. HOME CARE:  Were open shoe for 3-5 days as needed. Keep toe buddy taped for 3-5 days.  Rest the injured area.  If told, put ice on the injured area.  Put ice in a plastic bag.  Place a towel between your skin and the bag.  Leave the ice on for 20 minutes, 2-3 times per day.  If told, put light pressure (compression) on the injured area using an elastic bandage. Make sure the bandage is not too tight. Remove it and put it back on as told by your doctor.  If possible, raise (elevate) the injured area above the level of your heart while you are sitting or lying down.  Take over-the-counter and prescription medicines only as told by your doctor. GET HELP IF:  Your symptoms do not get better after several days of treatment.  Your symptoms get worse.  You have trouble moving the injured area. GET HELP RIGHT AWAY IF:   You have very bad pain.  You have a loss of feeling (numbness) in a hand or foot.  Your hand or foot turns pale or cold.   This information is not intended to replace advice given to you by your health care provider. Make sure you discuss any questions you have with your health care provider.   Document Released: 11/07/2007 Document Revised: 02/09/2015 Document Reviewed: 10/06/2014 Elsevier Interactive Patient Education [employer].

## 2015-11-28 NOTE — ED Provider Notes (Signed)
[facility] Emergency Department Provider Note  ____________________________________________  Time seen: Approximately 10:00 AM  I have reviewed the triage vital signs and the nursing notes.   HISTORY  Chief Complaint Fall; Toe Pain; Knee Pain; Hip Pain; and Rib Injury    HPI Samantha Macias is a 55 y.o. female complaining of pain to left ribs, left hip, left knee, and right foot. Patient states she slipped and fell yesterday injuring the right foot. Patient states she lost balance today and fell landing on the left side. Patient denies any loss of consciousness, vertigo, or increased from these falls. Patient arrived via EMS secondary to the fall this morning. No palliative measures taken for these complaints. Patient is rating her pain as 8/10. Patient described the pain as "achy". Patient state increased chest wall pain with deep inspirations. Patient also complaining of pain with weightbearing.   Past Medical History  Diagnosis Date  . Allergy   . Anxiety   . Fibromyalgia   . Chronic kidney disease   . Hypertension   . IBS (irritable bowel syndrome)   . Hyperlipidemia   . Spinal stenosis   . CAD (coronary artery disease)   . Kidney disease   . Abnormal gait     Patient Active Problem List   Diagnosis Date Noted  . Thyromegaly 08/18/2015  . Thyroid cyst 08/18/2015  . Osteoarthritis of both hands 08/01/2015  . Hypertension, benign 07/01/2015  . 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 Allergic rhinitis 12/01/2014  . Anemia in chronic illness 12/01/2014  . GAD (generalized anxiety disorder) 12/01/2014  . Back pain, chronic 12/01/2014  . Bipolar affective disorder, currently depressed, mild ([place]) 12/01/2014  . Atherosclerosis of left carotid artery 12/01/2014  . Carpal tunnel syndrome 12/01/2014  . Chronic kidney disease (CKD), stage III (moderate) 12/01/2014  . Insomnia, persistent 12/01/2014  . Chronic nonmalignant  pain 12/01/2014  . CN (constipation) 12/01/2014  . Kidney cysts 12/01/2014  . Cervical osteoarthritis 12/01/2014  . Dyslipidemia 12/01/2014  . Fibromyalgia syndrome 12/01/2014  . Abnormal gait 12/01/2014  . Gastro-esophageal reflux disease without esophagitis 12/01/2014  . History of colon polyps 12/01/2014  . History of syncope 12/01/2014  . H/O transient cerebral ischemia 12/01/2014  . HPV test positive 12/01/2014  . Personal history of fall 12/01/2014  . Calculus of kidney 12/01/2014  . Lumbar radiculopathy 12/01/2014  . OCD (obsessive compulsive disorder) 12/01/2014  . Overweight 12/01/2014  . PTSD (post-traumatic stress disorder) 12/01/2014  . Trochanteric bursitis of both hips 12/01/2014  . 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 Tubal ligation    . Nasal sinus surgery      Current Outpatient Rx  Name  Route  Sig  Dispense  Refill  . ALPRAZolam (XANAX) 0.5 MG tablet      TAKE 1 TO 1 AND 1/2 TABLETS BY MOUTH AT BEDTIME AS NEEDED FOR ANXIETY   45 tablet   0   . aspirin EC 325 MG tablet   Oral   Take 1 tablet (325 mg total) by mouth at bedtime.   30 tablet   0   . atorvastatin (LIPITOR) 40 MG tablet      TAKE 1 TABLET(40 MG) BY MOUTH DAILY   90 tablet   1     **Patient requests 90 days supply**   . Cholecalciferol (VITAMIN D) 2000 units tablet   Oral   Take 2,000 Units by mouth daily.         Marland Kitchen  diclofenac sodium (VOLTAREN) 1 % GEL   Topical   Apply 4 g topically 4 (four) times daily as needed.   100 g   5   . hydrALAZINE (APRESOLINE) 25 MG tablet   Oral   Take 25 mg by mouth 3 (three) times daily. Will stop once patient starts losartan/hydrochlorothiazide         . ibuprofen (ADVIL,MOTRIN) 600 MG tablet   Oral   Take 1 tablet (600 mg total) by mouth every 6 (six) hours as needed.   30 tablet   0   . losartan-hydrochlorothiazide (HYZAAR) 50-12.5 MG tablet   Oral   Take 1 tablet by mouth  daily.   90 tablet   1   . oxyCODONE-acetaminophen (ROXICET) 5-325 MG tablet   Oral   Take 1 tablet by mouth every 4 (four) hours as needed for severe pain.   6 tablet   0   . pregabalin (LYRICA) 200 MG capsule   Oral   Take 1 capsule (200 mg total) by mouth 3 (three) times daily.   90 capsule   5   . QUEtiapine (SEROQUEL) 50 MG tablet      TAKE 1 TO 2 TABLETS(50 TO 100 MG) BY MOUTH AT BEDTIME   60 tablet   0   . ramelteon (ROZEREM) 8 MG tablet   Oral   Take 8 mg by mouth at bedtime.         . ranitidine (ZANTAC) 150 MG tablet   Oral   Take 1 tablet (150 mg total) by mouth daily as needed for heartburn.   30 tablet   2   . tiZANidine (ZANAFLEX) 2 MG tablet   Oral   Take 1 tablet (2 mg total) by mouth at bedtime.   90 tablet   1   . traMADol (ULTRAM) 50 MG tablet      TAKE 1 TABLET BY MOUTH THREE TIMES DAILY   90 tablet   0     Allergies Shellfish allergy; Ace inhibitors; Codeine; Nitrofurantoin monohyd macro; Other; Red dye; and Tetanus toxoid adsorbed  Family History  Problem Relation Age of Onset  . Peripheral vascular disease Mother   . Anxiety disorder Mother   . Depression Mother   . Thyroid disease Mother   . Congestive Heart Failure Father   . Alcohol abuse Father     Social History Social History  Substance Use Topics  . Smoking status: Current Every Day Smoker -- 1.00 packs/day for 25 years    Types: Cigarettes  . Smokeless tobacco: Never Used  . Alcohol Use: No    Review of Systems Constitutional: No fever/chills Eyes: No visual changes. ENT: No sore throat. Cardiovascular: Denies chest pain. Respiratory: Denies shortness of breath. Gastrointestinal: No abdominal pain.  No nausea, no vomiting.  No diarrhea.  No constipation. Genitourinary: Negative for dysuria. Musculoskeletal: Pain to left chest wall, left hip, left knee, and right foot.  Skin: Negative for rash. Bruising and swelling to the third digit right  foot. Neurological: Negative for headaches, focal weakness or numbness. Psychiatric:Anxiety Endocrine:Hypertension and hyperlipidemia. Allergic/Immunilogical: See allergy list ____________________________________________   PHYSICAL EXAM:  VITAL SIGNS: ED Triage Vitals  Enc Vitals Group     BP 11/28/15 0933 108/62 mmHg     Pulse Rate 11/28/15 0933 99     Resp 11/28/15 0933 16     Temp 11/28/15 0933 98.2 F (36.8 C)     Temp Source 11/28/15 0933 Oral     SpO2  11/28/15 0933 100 %     Weight 11/28/15 0933 183 lb (83.008 kg)     Height 11/28/15 0933 5\' 8"  (1.727 m)     Head Cir --      Peak Flow --      Pain Score 11/28/15 0934 8     Pain Loc --      Pain Edu? --      Excl. in [place]? --     Constitutional: Alert and oriented. Well appearing and in no acute distress. Eyes: Conjunctivae are normal. PERRL. EOMI. Head: Atraumatic. Nose: No congestion/rhinnorhea. Mouth/Throat: Mucous membranes are moist.  Oropharynx non-erythematous. Neck: No stridor.  No cervical spine tenderness to palpation. Hematological/Lymphatic/Immunilogical: No cervical lymphadenopathy. Cardiovascular: Normal rate, regular rhythm. Grossly normal heart sounds.  Good peripheral circulation. Respiratory: Normal respiratory effort.  No retractions. Lungs CTAB. Gastrointestinal: Soft and nontender. No distention. No abdominal bruits. No CVA tenderness. Musculoskeletal: There is no deformity to the left lateral ribs, left hip, left knee, and right foot. All areas of tenderness to palpation. She does have splinting with deep inspirations. Neurologic:  Normal speech and language. No gross focal neurologic deficits are appreciated. No gait instability. Skin:  Skin is warm, dry and intact. No rash noted. Ecchymosis third digit right foot. Psychiatric: Mood and affect are normal. Speech and behavior are normal.  ____________________________________________   LABS (all labs ordered are listed, but only abnormal  results are displayed)  Labs Reviewed - No data to display ____________________________________________  EKG   ____________________________________________  RADIOLOGY No acute findings on x-ray of the left ribs, left hip, left knee, and right foot.  ____________________________________________   PROCEDURES  Procedure(s) performed: None  Critical Care performed: No  ____________________________________________   INITIAL IMPRESSION / ASSESSMENT AND PLAN / ED COURSE  Pertinent labs & imaging results that were available during my care of the patient were reviewed by me and considered in my medical decision making (see chart for details). Discussed x-ray finding with patient.  Contusion left rib, left hip, left knee, and right foot. Ecchymosis to the third toe right foot is apparent. Patient presents a open shoe, description for Percocets and ibuprofen. Patient advised to follow-up with family doctor for continued care. ____________________________________________   FINAL CLINICAL IMPRESSION(S) / ED DIAGNOSES  Final diagnoses:  Rib contusion, left, initial encounter  Contusion of left hip and thigh, initial encounter  Knee contusion, left, initial encounter  Foot contusion, right, initial encounter      NEW MEDICATIONS STARTED DURING THIS VISIT:  New Prescriptions   IBUPROFEN (ADVIL,MOTRIN) 600 MG TABLET    Take 1 tablet (600 mg total) by mouth every 6 (six) hours as needed.   OXYCODONE-ACETAMINOPHEN (ROXICET) 5-325 MG TABLET    Take 1 tablet by mouth every 4 (four) hours as needed for severe pain.     Note:  This document was prepared using Dragon voice recognition software and may include unintentional dictation errors.    Sable Feil, PA-C 11/28/15 1153  Daymon Larsen, MD 11/28/15 [phone]

## 2015-11-28 NOTE — ED Notes (Signed)
Pt arrives to ER via [facility] c/o mechanical fall last night and injury to 3rd digit right foot, red and swollen. Pt near fall this AM in shower, c/o left rib, left hip and left knee pain. Pt ambulatory with [facility].

## 2015-12-01 ENCOUNTER — Encounter: Payer: Self-pay | Admitting: Family Medicine

## 2015-12-01 ENCOUNTER — Ambulatory Visit (INDEPENDENT_AMBULATORY_CARE_PROVIDER_SITE_OTHER): Payer: Medicare Other | Admitting: Family Medicine

## 2015-12-01 ENCOUNTER — Other Ambulatory Visit: Payer: Self-pay | Admitting: Family Medicine

## 2015-12-01 VITALS — BP 126/72 | HR 98 | Temp 97.7°F | Resp 20 | Ht 67.0 in | Wt 181.1 lb

## 2015-12-01 DIAGNOSIS — M797 Fibromyalgia: Secondary | ICD-10-CM

## 2015-12-01 DIAGNOSIS — M549 Dorsalgia, unspecified: Secondary | ICD-10-CM

## 2015-12-01 DIAGNOSIS — I10 Essential (primary) hypertension: Secondary | ICD-10-CM | POA: Diagnosis not present

## 2015-12-01 DIAGNOSIS — N183 Chronic kidney disease, stage 3 unspecified: Secondary | ICD-10-CM

## 2015-12-01 DIAGNOSIS — E785 Hyperlipidemia, unspecified: Secondary | ICD-10-CM | POA: Diagnosis not present

## 2015-12-01 DIAGNOSIS — K219 Gastro-esophageal reflux disease without esophagitis: Secondary | ICD-10-CM | POA: Diagnosis not present

## 2015-12-01 DIAGNOSIS — F3131 Bipolar disorder, current episode depressed, mild: Secondary | ICD-10-CM | POA: Diagnosis not present

## 2015-12-01 DIAGNOSIS — G8929 Other chronic pain: Secondary | ICD-10-CM

## 2015-12-01 DIAGNOSIS — R296 Repeated falls: Secondary | ICD-10-CM | POA: Diagnosis not present

## 2015-12-01 DIAGNOSIS — R269 Unspecified abnormalities of gait and mobility: Secondary | ICD-10-CM

## 2015-12-01 DIAGNOSIS — S92911D Unspecified fracture of right toe(s), subsequent encounter for fracture with routine healing: Secondary | ICD-10-CM | POA: Diagnosis not present

## 2015-12-01 DIAGNOSIS — N2581 Secondary hyperparathyroidism of renal origin: Secondary | ICD-10-CM | POA: Diagnosis not present

## 2015-12-01 DIAGNOSIS — G4700 Insomnia, unspecified: Secondary | ICD-10-CM | POA: Diagnosis not present

## 2015-12-01 MED ORDER — RANITIDINE HCL 150 MG PO TABS: 150.0000 mg | ORAL_TABLET | Freq: Every day | ORAL | Status: DC | PRN

## 2015-12-01 MED ORDER — TRAMADOL HCL 50 MG PO TABS: 50.0000 mg | ORAL_TABLET | Freq: Three times a day (TID) | ORAL | Status: DC | PRN

## 2015-12-01 MED ORDER — ATORVASTATIN CALCIUM 40 MG PO TABS
ORAL_TABLET | ORAL | Status: DC
Start: 2015-12-01 — End: 2016-06-11

## 2015-12-01 MED ORDER — ASPIRIN EC 81 MG PO TBEC: 81.0000 mg | DELAYED_RELEASE_TABLET | Freq: Every day | ORAL | Status: DC

## 2015-12-01 MED ORDER — PREGABALIN 200 MG PO CAPS: 200.0000 mg | ORAL_CAPSULE | Freq: Three times a day (TID) | ORAL | Status: DC

## 2015-12-01 MED ORDER — TRAMADOL HCL 50 MG PO TABS: 50.0000 mg | ORAL_TABLET | Freq: Three times a day (TID) | ORAL | Status: DC

## 2015-12-01 MED ORDER — OXYCODONE-ACETAMINOPHEN 5-325 MG PO TABS: 1.0000 | ORAL_TABLET | ORAL | Status: DC | PRN

## 2015-12-01 MED ORDER — LOSARTAN POTASSIUM-HCTZ 100-12.5 MG PO TABS: 1.0000 | ORAL_TABLET | Freq: Every day | ORAL | Status: AC

## 2015-12-01 MED ORDER — RAMELTEON 8 MG PO TABS: 8.0000 mg | ORAL_TABLET | Freq: Every day | ORAL | Status: DC

## 2015-12-01 NOTE — Progress Notes (Signed)
Name: Samantha Macias   MRN: [id number]    DOB: [birth year]   Date:12/01/2015       Progress Note  Subjective  Chief Complaint  Chief Complaint  Patient presents with  . Medication Refill    4 month F/U  . Fibromyalgia    Muscle weakness and falling alot recently  . Insomnia    Patient is staying in bed due to scared of falling.  Marland Kitchen Hypertension    Patient states she has been checking her BP at home and her pressure has been normal with ranges of 130-120/103-70's. Hydralazine in the morning and Losartan at night has been controlling her BP well.   . Pain    Patient has been in pain with her right foot and bruised her hip from a fall  . Gastroesophageal Reflux    Comes and goes  . Hyperlipidemia    Nausea  . Fall    Patient has been falling alot recently and has bruised her hip and thigh. Patient just recently fell in her living room and wants to ask for accommodations for her apartment to help from falling and having alot of anxiety due to having falls.     HPI  FMS: she has a long history of FMS, pain is stable, taking Lyrica and discussed stopping it because of risk of falls, but she states she is not able to move without medication, usually uses a walker, but because of fracture of 3rd toe she has been using a wheelchair, she also uses when in a lot of pain Also has mental fogginess, lack of energy.   Multiple falls/recurrent: she has fallen multiple times ( 30 or plus times ) over the past 3 months. The last fall was 3 days ago, and broke her 3rd right toe, she was given 6 pills of Oxycodone and Ibuprofen but she did not fill rx of Ibuprofen. We will refer her to Podiatrist, advised to get Ibuprofen and take it for one week, and fill 6 more oxycodones. We also stopped medications that can increase falls such as seroquel ( change to Rozerem to sleep ), stop Tizanidine, stop Hydralazine and increase dose of Losartan. Only taking alprazolam prn, discussed stopping Tramadol but she  will try to wean off, currently only taking it prn.   Dysphagia: she states she has intermittent episodes of dysphagia, but worse over the past couple of weeks  EC visit /memory changes: she was not able to see Neurologist.  She has difficulty swallowing, also confusion and had to go to [facility], she is down to aspirin 81 mg because of severe bruising.  She had a MRI/MRA that showed mild atrophy, no acute changes. Advised to have further follow up as outpatient, but she needs to wait for transportation and states also wants to wait until her toe fracture is healed.   Bipolar Disorder:she saw Psychiatrist in March and was given Paxil but never started medication, was diagnosed with anxiety/depression and OCD.  She is having pressure speech and flight of ideas today. Advised to return to see Psychiatrist   Patient Active Problem List   Diagnosis Date Noted  . Thyromegaly 08/18/2015  . Thyroid cyst 08/18/2015  . Osteoarthritis of both hands 08/01/2015  . Hypertension, benign 07/01/2015  . Hyperparathyroidism, secondary renal ([place]) 12/03/2014  . Allergic rhinitis 12/01/2014  . Anemia in chronic illness 12/01/2014  . GAD (generalized anxiety disorder) 12/01/2014  . Back pain, chronic 12/01/2014  . Bipolar affective disorder, currently depressed, mild ([place]) 12/01/2014  .  Atherosclerosis of left carotid artery 12/01/2014  . Carpal tunnel syndrome 12/01/2014  . Chronic kidney disease (CKD), stage III (moderate) 12/01/2014  . Insomnia, persistent 12/01/2014  . Chronic nonmalignant pain 12/01/2014  . CN (constipation) 12/01/2014  . Kidney cysts 12/01/2014  . Cervical osteoarthritis 12/01/2014  . Dyslipidemia 12/01/2014  . Fibromyalgia syndrome 12/01/2014  . Abnormal gait 12/01/2014  . Gastro-esophageal reflux disease without esophagitis 12/01/2014  . History of colon polyps 12/01/2014  . History of syncope 12/01/2014  . H/O transient cerebral ischemia 12/01/2014  . HPV test positive 12/01/2014   . Personal history of fall 12/01/2014  . Calculus of kidney 12/01/2014  . Lumbar radiculopathy 12/01/2014  . OCD (obsessive compulsive disorder) 12/01/2014  . Overweight 12/01/2014  . PTSD (post-traumatic stress disorder) 12/01/2014  . Trochanteric bursitis of both hips 12/01/2014  . 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 Tubal ligation    . Nasal sinus surgery      Family History  Problem Relation Age of Onset  . Peripheral vascular disease Mother   . Anxiety disorder Mother   . Depression Mother   . Thyroid disease Mother   . Congestive Heart Failure Father   . Alcohol abuse Father     Social History   Social History  . Marital Status: Single    Spouse Name: N/A  . Number of Children: 2  . Years of Education: N/A   Occupational History  . Not on file.   Social History Main Topics  . Smoking status: Current Every Day Smoker -- 1.00 packs/day for 25 years    Types: Cigarettes  . Smokeless tobacco: Never Used  . Alcohol Use: No  . Drug Use: No  . Sexual Activity: Not Currently   Other Topics Concern  . Not on file   Social History Narrative     Current outpatient prescriptions:  .  ALPRAZolam (XANAX) 0.5 MG tablet, TAKE 1 TO 1 AND 1/2 TABLETS BY MOUTH AT BEDTIME AS NEEDED FOR ANXIETY, Disp: 45 tablet, Rfl: 0 .  aspirin EC 81 MG tablet, Take 1 tablet (81 mg total) by mouth at bedtime., Disp: , Rfl:  .  atorvastatin (LIPITOR) 40 MG tablet, TAKE 1 TABLET(40 MG) BY MOUTH DAILY, Disp: 90 tablet, Rfl: 1 .  Cholecalciferol (VITAMIN D) 2000 units tablet, Take 2,000 Units by mouth daily., Disp: , Rfl:  .  diclofenac sodium (VOLTAREN) 1 % GEL, Apply 4 g topically 4 (four) times daily as needed., Disp: 100 g, Rfl: 5 .  oxyCODONE-acetaminophen (ROXICET) 5-325 MG tablet, Take 1 tablet by mouth every 4 (four) hours as needed for severe pain., Disp: 6 tablet, Rfl: 0 .  pregabalin (LYRICA) 200 MG capsule, Take 1  capsule (200 mg total) by mouth 3 (three) times daily., Disp: 90 capsule, Rfl: 5 .  ramelteon (ROZEREM) 8 MG tablet, Take 1 tablet (8 mg total) by mouth at bedtime., Disp: 90 tablet, Rfl: 1 .  ranitidine (ZANTAC) 150 MG tablet, Take 1 tablet (150 mg total) by mouth daily as needed for heartburn., Disp: 90 tablet, Rfl: 1 .  traMADol (ULTRAM) 50 MG tablet, Take 1 tablet (50 mg total) by mouth 3 (three) times daily., Disp: 90 tablet, Rfl: 0 .  ibuprofen (ADVIL,MOTRIN) 600 MG tablet, Take 1 tablet (600 mg total) by mouth every 6 (six) hours as needed. (Patient not taking: Reported on 12/01/2015), Disp: 30 tablet, Rfl: 0 .  losartan-hydrochlorothiazide (HYZAAR) 100-12.5 MG tablet, Take 1  tablet by mouth daily., Disp: 90 tablet, Rfl: 1 .  traMADol (ULTRAM) 50 MG tablet, Take 1 tablet (50 mg total) by mouth every 8 (eight) hours as needed., Disp: 30 tablet, Rfl: 0  Allergies  Allergen Reactions  . Shellfish Allergy Shortness Of Breath, Anaphylaxis and Nausea And Vomiting    Other reaction(s): Swollen lips  . Ace Inhibitors     cough  . Codeine Hives  . Nitrofurantoin Monohyd Macro Nausea Only  . Other Nausea And Vomiting    Tetanus  . Red Dye Itching  . Tetanus Toxoid Adsorbed      ROS  Constitutional: Negative for fever or weight change.  Respiratory: Negative for cough and shortness of breath.   Cardiovascular: Negative for chest pain or palpitations.  Gastrointestinal: Negative for abdominal pain, no bowel changes.  Musculoskeletal: Positive for gait problem and joint swelling.  Skin: Negative for rash.  Neurological: Negative for dizziness or headache.  No other specific complaints in a complete review of systems (except as listed in HPI above).   Objective  Filed Vitals:   12/01/15 1102 12/01/15 1157  BP: 126/72   Pulse: 123 98  Temp: 97.7 F (36.5 C)   TempSrc: Oral   Resp: 20   Height: 5\' 7"  (1.702 m)   Weight: 181 lb 1.6 oz (82.146 kg)   SpO2: 99%     Body mass index  is 28.36 kg/(m^2).  Physical Exam  Constitutional: Patient appears well-developed and well-nourished. Obese  No distress.  HEENT: head atraumatic, normocephalic, pupils equal and reactive to light,  neck supple, throat within normal limits Cardiovascular: Normal rate, regular rhythm and normal heart sounds.  No murmur heard. No BLE edema. Pulmonary/Chest: Effort normal and breath sounds normal. No respiratory distress. Abdominal: Soft.  There is no tenderness. Psychiatric: Patient has a normal mood and affect. behavior is normal. Judgment and thought content normal. Muscular Skeletal: bruised 3rd right toe, pain with rom, trigger point positive, on a wheelchair.   PHQ2/9: Depression screen [facility] 2/9 08/01/2015 03/28/2015 12/20/2014  Decreased Interest 0 0 0  Down, Depressed, Hopeless 1 0 0  PHQ - 2 Score 1 0 0     Fall Risk: Fall Risk  12/01/2015 08/01/2015 06/20/2015 03/28/2015 12/20/2014  Falls in the past year? Yes Yes No Yes Yes  Number falls in past yr: 2 or more 2 or more - 2 or more 2 or more  Injury with Fall? Yes Yes - Yes No  Risk Factor Category  - High Fall Risk - - -  Risk for fall due to : - History of fall(s);Impaired mobility;Impaired balance/gait - - -  Follow up - Falls evaluation completed;Falls prevention discussed - - -      Functional Status Survey: Is the patient deaf or have difficulty hearing?: No Does the patient have difficulty seeing, even when wearing glasses/contacts?: No Does the patient have difficulty concentrating, remembering, or making decisions?: No Does the patient have difficulty walking or climbing stairs?: Yes (In a wheelchair due to a fall) Does the patient have difficulty dressing or bathing?: No Does the patient have difficulty doing errands alone such as visiting a doctor's office or shopping?: No   Assessment & Plan  1. Fibromyalgia syndrome  - pregabalin (LYRICA) 200 MG capsule; Take 1 capsule (200 mg total) by mouth 3 (three) times  daily.  Dispense: 90 capsule; Refill: 5  2. Insomnia, persistent  - ramelteon (ROZEREM) 8 MG tablet; Take 1 tablet (8 mg total) by mouth at  bedtime.  Dispense: 90 tablet; Refill: 1  3. Bipolar affective disorder, currently depressed, mild ([place])  Advised to return to Psychiatrist  4. Hypertension, benign  We will adjust dose , increase from 50 to 100mg  and stop Hydralazine - losartan-hydrochlorothiazide (HYZAAR) 100-12.5 MG tablet; Take 1 tablet by mouth daily.  Dispense: 90 tablet; Refill: 1 - COMPLETE METABOLIC PANEL WITH GFR  5. Abnormal gait  Refer for home PT  6. Hyperparathyroidism, secondary renal (HCC)  - COMPLETE METABOLIC PANEL WITH GFR  7. Dyslipidemia  - atorvastatin (LIPITOR) 40 MG tablet; TAKE 1 TABLET(40 MG) BY MOUTH DAILY  Dispense: 90 tablet; Refill: 1  8. Recurrent falls  - Ambulatory referral to [place]  9. Back pain, chronic  - pregabalin (LYRICA) 200 MG capsule; Take 1 capsule (200 mg total) by mouth 3 (three) times daily.  Dispense: 90 capsule; Refill: 5  10. Fracture of right toe, with routine healing, subsequent encounter  - oxyCODONE-acetaminophen (ROXICET) 5-325 MG tablet; Take 1 tablet by mouth every 4 (four) hours as needed for severe pain.  Dispense: 6 tablet; Refill: 0 - Ambulatory referral to Podiatry  11. Gastroesophageal reflux disease without esophagitis  - ranitidine (ZANTAC) 150 MG tablet; Take 1 tablet (150 mg total) by mouth daily as needed for heartburn.  Dispense: 90 tablet; Refill: 1  12. Chronic kidney disease, stage 3 (moderate)  - COMPLETE METABOLIC PANEL WITH GFR

## 2015-12-05 ENCOUNTER — Encounter: Payer: Self-pay | Admitting: Family Medicine

## 2015-12-14 ENCOUNTER — Ambulatory Visit: Payer: Self-pay | Admitting: Family Medicine

## 2015-12-14 ENCOUNTER — Telehealth: Payer: Self-pay | Admitting: Family Medicine

## 2015-12-14 NOTE — Telephone Encounter (Signed)
PT ASKING FOR STATUS ON PT REFERRAL. I SEE WHERE IT HAS BEEN DONE ON OUR END BUT SHE HAS NOT HEARD ANYTHING YET. PT ASKED WE CALL HER WHEN WE FIND OUT SOMETHING.

## 2015-12-14 NOTE — Telephone Encounter (Signed)
done

## 2015-12-15 DIAGNOSIS — I2510 Atherosclerotic heart disease of native coronary artery without angina pectoris: Secondary | ICD-10-CM | POA: Diagnosis not present

## 2015-12-15 DIAGNOSIS — N183 Chronic kidney disease, stage 3 (moderate): Secondary | ICD-10-CM | POA: Diagnosis not present

## 2015-12-15 DIAGNOSIS — R296 Repeated falls: Secondary | ICD-10-CM | POA: Diagnosis not present

## 2015-12-15 DIAGNOSIS — Z7982 Long term (current) use of aspirin: Secondary | ICD-10-CM | POA: Diagnosis not present

## 2015-12-15 DIAGNOSIS — S92911D Unspecified fracture of right toe(s), subsequent encounter for fracture with routine healing: Secondary | ICD-10-CM | POA: Diagnosis not present

## 2015-12-15 DIAGNOSIS — I129 Hypertensive chronic kidney disease with stage 1 through stage 4 chronic kidney disease, or unspecified chronic kidney disease: Secondary | ICD-10-CM | POA: Diagnosis not present

## 2015-12-21 ENCOUNTER — Other Ambulatory Visit: Payer: Self-pay | Admitting: Family Medicine

## 2015-12-21 DIAGNOSIS — I2510 Atherosclerotic heart disease of native coronary artery without angina pectoris: Secondary | ICD-10-CM | POA: Diagnosis not present

## 2015-12-21 DIAGNOSIS — I129 Hypertensive chronic kidney disease with stage 1 through stage 4 chronic kidney disease, or unspecified chronic kidney disease: Secondary | ICD-10-CM | POA: Diagnosis not present

## 2015-12-21 DIAGNOSIS — Z7982 Long term (current) use of aspirin: Secondary | ICD-10-CM | POA: Diagnosis not present

## 2015-12-21 DIAGNOSIS — N183 Chronic kidney disease, stage 3 (moderate): Secondary | ICD-10-CM | POA: Diagnosis not present

## 2015-12-21 DIAGNOSIS — R296 Repeated falls: Secondary | ICD-10-CM | POA: Diagnosis not present

## 2015-12-21 DIAGNOSIS — S92911D Unspecified fracture of right toe(s), subsequent encounter for fracture with routine healing: Secondary | ICD-10-CM | POA: Diagnosis not present

## 2015-12-22 DIAGNOSIS — R296 Repeated falls: Secondary | ICD-10-CM | POA: Diagnosis not present

## 2015-12-22 DIAGNOSIS — Z7982 Long term (current) use of aspirin: Secondary | ICD-10-CM | POA: Diagnosis not present

## 2015-12-22 DIAGNOSIS — I2510 Atherosclerotic heart disease of native coronary artery without angina pectoris: Secondary | ICD-10-CM | POA: Diagnosis not present

## 2015-12-22 DIAGNOSIS — I129 Hypertensive chronic kidney disease with stage 1 through stage 4 chronic kidney disease, or unspecified chronic kidney disease: Secondary | ICD-10-CM | POA: Diagnosis not present

## 2015-12-22 DIAGNOSIS — N183 Chronic kidney disease, stage 3 (moderate): Secondary | ICD-10-CM | POA: Diagnosis not present

## 2015-12-22 DIAGNOSIS — S92911D Unspecified fracture of right toe(s), subsequent encounter for fracture with routine healing: Secondary | ICD-10-CM | POA: Diagnosis not present

## 2015-12-23 DIAGNOSIS — S92911D Unspecified fracture of right toe(s), subsequent encounter for fracture with routine healing: Secondary | ICD-10-CM | POA: Diagnosis not present

## 2015-12-23 DIAGNOSIS — Z7982 Long term (current) use of aspirin: Secondary | ICD-10-CM | POA: Diagnosis not present

## 2015-12-23 DIAGNOSIS — I129 Hypertensive chronic kidney disease with stage 1 through stage 4 chronic kidney disease, or unspecified chronic kidney disease: Secondary | ICD-10-CM | POA: Diagnosis not present

## 2015-12-23 DIAGNOSIS — N183 Chronic kidney disease, stage 3 (moderate): Secondary | ICD-10-CM | POA: Diagnosis not present

## 2015-12-23 DIAGNOSIS — I2510 Atherosclerotic heart disease of native coronary artery without angina pectoris: Secondary | ICD-10-CM | POA: Diagnosis not present

## 2015-12-23 DIAGNOSIS — R296 Repeated falls: Secondary | ICD-10-CM | POA: Diagnosis not present

## 2016-01-04 ENCOUNTER — Other Ambulatory Visit: Payer: Self-pay | Admitting: Family Medicine

## 2016-01-04 ENCOUNTER — Telehealth: Payer: Self-pay

## 2016-01-04 ENCOUNTER — Ambulatory Visit (INDEPENDENT_AMBULATORY_CARE_PROVIDER_SITE_OTHER): Payer: Medicare Other | Admitting: Family Medicine

## 2016-01-04 ENCOUNTER — Encounter: Payer: Self-pay | Admitting: Family Medicine

## 2016-01-04 VITALS — BP 126/74 | HR 80 | Temp 97.5°F | Resp 18 | Ht 67.0 in | Wt 182.1 lb

## 2016-01-04 DIAGNOSIS — Z1231 Encounter for screening mammogram for malignant neoplasm of breast: Secondary | ICD-10-CM | POA: Diagnosis not present

## 2016-01-04 DIAGNOSIS — R2681 Unsteadiness on feet: Secondary | ICD-10-CM

## 2016-01-04 DIAGNOSIS — R000 Tachycardia, unspecified: Secondary | ICD-10-CM | POA: Diagnosis not present

## 2016-01-04 DIAGNOSIS — R94120 Abnormal auditory function study: Secondary | ICD-10-CM | POA: Diagnosis not present

## 2016-01-04 DIAGNOSIS — Z0000 Encounter for general adult medical examination without abnormal findings: Secondary | ICD-10-CM | POA: Diagnosis not present

## 2016-01-04 DIAGNOSIS — N2581 Secondary hyperparathyroidism of renal origin: Secondary | ICD-10-CM | POA: Diagnosis not present

## 2016-01-04 DIAGNOSIS — R296 Repeated falls: Secondary | ICD-10-CM

## 2016-01-04 DIAGNOSIS — I10 Essential (primary) hypertension: Secondary | ICD-10-CM | POA: Diagnosis not present

## 2016-01-04 DIAGNOSIS — N183 Chronic kidney disease, stage 3 (moderate): Secondary | ICD-10-CM | POA: Diagnosis not present

## 2016-01-04 MED ORDER — WHEELCHAIR MISC: 1.0000 | Freq: Every day | 0 refills | Status: DC

## 2016-01-04 NOTE — Telephone Encounter (Signed)
Patient requesting refill. Alprazolam

## 2016-01-04 NOTE — Telephone Encounter (Signed)
Samantha Macias called pharmacy and she never picked up rx. It is ready for pick up now. Samantha Macias has notified patient

## 2016-01-04 NOTE — Progress Notes (Signed)
Name: Samantha Macias   MRN: [id number]    DOB: [birth year]   Date:01/04/2016       Progress Note  Subjective  Chief Complaint  Chief Complaint  Patient presents with  . Medicare Wellness    HPI  Functional ability/safety issues: She uses a walker, has an aid that goes to her house Hearing issues: she has noticed that she needs to turn volume up on TV and a lot of wax formation  Activities of daily living: Discussed - she needs assistance / has an aid. Lives alone Home safety issues: No Issues  End Of Life Planning: Offered verbal information regarding advanced directives, healthcare power of attorney.  Preventative care, Health maintenance, Preventative health measures discussed.  Preventative screenings discussed today: lab work, colonoscopy,  mammogram, DEXA.  Low Dose CT Chest recommended if Age 34-80 years, 30 pack-year currently smoking OR have quit w/in 15years.   Lifestyle risk factor issued reviewed: Diet, exercise -unable , weight management, advised patient smoking is not healthy, nutrition/diet.  Preventative health measures discussed (5-10 year plan).  Reviewed and recommended vaccinations: - Pneumovax  - Prevnar  - Annual Influenza - Zostavax - Tdap   Depression screening: Done Fall risk screening: Done Discuss ADLs/IADLs: Done  Current medical providers: See HPI  Other health risk factors identified this visit: No other issues Cognitive impairment issues: she has difficulty using computer, her son helps paying her bills  All above discussed with patient. Appropriate education, counseling and referral will be made based upon the above.   HTN: patient states bp has dropped lately, taking losartan hctz, advised to decrease to half pill daily and recheck it next visit. No chest pain, she has mild SOB ( chronic ), she has intermittent palpitation  Patient Active Problem List   Diagnosis Date Noted  . Thyromegaly 08/18/2015  . Thyroid cyst 08/18/2015   . Osteoarthritis of both hands 08/01/2015  . Hypertension, benign 07/01/2015  . Hyperparathyroidism, secondary renal ([place]) 12/03/2014  . Allergic rhinitis 12/01/2014  . Anemia in chronic illness 12/01/2014  . GAD (generalized anxiety disorder) 12/01/2014  . Back pain, chronic 12/01/2014  . Bipolar affective disorder, currently depressed, mild ([place]) 12/01/2014  . Atherosclerosis of left carotid artery 12/01/2014  . Carpal tunnel syndrome 12/01/2014  . Chronic kidney disease (CKD), stage III (moderate) 12/01/2014  . Insomnia, persistent 12/01/2014  . Chronic nonmalignant pain 12/01/2014  . CN (constipation) 12/01/2014  . Kidney cysts 12/01/2014  . Cervical osteoarthritis 12/01/2014  . Dyslipidemia 12/01/2014  . Fibromyalgia syndrome 12/01/2014  . Abnormal gait 12/01/2014  . Gastro-esophageal reflux disease without esophagitis 12/01/2014  . History of colon polyps 12/01/2014  . History of syncope 12/01/2014  . H/O transient cerebral ischemia 12/01/2014  . HPV test positive 12/01/2014  . Personal history of fall 12/01/2014  . Calculus of kidney 12/01/2014  . Lumbar radiculopathy 12/01/2014  . OCD (obsessive compulsive disorder) 12/01/2014  . Overweight 12/01/2014  . PTSD (post-traumatic stress disorder) 12/01/2014  . Trochanteric bursitis of both hips 12/01/2014  . 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 NASAL SINUS SURGERY    . TUBAL LIGATION      Family History  Problem Relation Age of Onset  . Peripheral vascular disease Mother   . Anxiety disorder Mother   . Depression Mother   . Thyroid disease Mother   . Congestive Heart Failure Father   . Alcohol abuse Father     Social History  Social History  . Marital status: Single    Spouse name: N/A  . Number of children: 2  . Years of education: N/A   Occupational History  . Not on file.   Social History Main Topics  . Smoking status: Current Every Day  Smoker    Packs/day: 1.00    Years: 25.00    Types: Cigarettes  . Smokeless tobacco: Never Used  . Alcohol use No  . Drug use: No  . Sexual activity: Not Currently   Other Topics Concern  . Not on file   Social History Narrative  . No narrative on file     Current Outpatient Prescriptions:  .  ALPRAZolam (XANAX) 0.5 MG tablet, TAKE 1-1 1/2 TABLETS BY MOUTH EVERY NIGHT AT BEDTIME AS NEEDED FOR ANXIETY, Disp: 45 tablet, Rfl: 0 .  aspirin EC 81 MG tablet, Take 1 tablet (81 mg total) by mouth at bedtime., Disp: , Rfl:  .  atorvastatin (LIPITOR) 40 MG tablet, TAKE 1 TABLET(40 MG) BY MOUTH DAILY, Disp: 90 tablet, Rfl: 1 .  Cholecalciferol (VITAMIN D) 2000 units tablet, Take 2,000 Units by mouth daily., Disp: , Rfl:  .  diclofenac sodium (VOLTAREN) 1 % GEL, Apply 4 g topically 4 (four) times daily as needed., Disp: 100 g, Rfl: 5 .  ibuprofen (ADVIL,MOTRIN) 600 MG tablet, Take 1 tablet (600 mg total) by mouth every 6 (six) hours as needed. (Patient not taking: Reported on 12/01/2015), Disp: 30 tablet, Rfl: 0 .  losartan-hydrochlorothiazide (HYZAAR) 100-12.5 MG tablet, Take 1 tablet by mouth daily., Disp: 90 tablet, Rfl: 1 .  Misc. Devices [facility]) MISC, 1 each by Does not apply route daily., Disp: 1 each, Rfl: 0 .  oxyCODONE-acetaminophen (ROXICET) 5-325 MG tablet, Take 1 tablet by mouth every 4 (four) hours as needed for severe pain., Disp: 6 tablet, Rfl: 0 .  pregabalin (LYRICA) 200 MG capsule, Take 1 capsule (200 mg total) by mouth 3 (three) times daily., Disp: 90 capsule, Rfl: 5 .  ramelteon (ROZEREM) 8 MG tablet, Take 1 tablet (8 mg total) by mouth at bedtime., Disp: 90 tablet, Rfl: 1 .  ranitidine (ZANTAC) 150 MG tablet, Take 1 tablet (150 mg total) by mouth daily as needed for heartburn., Disp: 90 tablet, Rfl: 1 .  traMADol (ULTRAM) 50 MG tablet, Take 1 tablet (50 mg total) by mouth every 8 (eight) hours as needed., Disp: 90 tablet, Rfl: 0 .  traMADol (ULTRAM) 50 MG tablet, Take 1  tablet (50 mg total) by mouth 3 (three) times daily., Disp: 90 tablet, Rfl: 0  Allergies  Allergen Reactions  . Shellfish Allergy Shortness Of Breath, Anaphylaxis and Nausea And Vomiting    Other reaction(s): Swollen lips  . Ace Inhibitors     cough  . Codeine Hives  . Nitrofurantoin Monohyd Macro Nausea Only  . Other Nausea And Vomiting    Tetanus  . Red Dye Itching  . Tetanus Toxoid Adsorbed      ROS  Constitutional: Negative for fever or weight change.  Respiratory: Negative for cough, positive for shortness of breath - chronic .   Cardiovascular: Negative for chest pain or palpitations.  Gastrointestinal: Negative for abdominal pain, no bowel changes.  Musculoskeletal: Positive  for gait problem no  joint swelling.  Skin: Negative for rash.  Neurological: Negative for dizziness or headache.  No other specific complaints in a complete review of systems (except as listed in HPI above).  Objective  Vitals:   01/04/16 1113 01/04/16 1201  BP:  126/74   Pulse: (!) 123 80  Resp: 18   Temp: 97.5 F (36.4 C)   SpO2: 96%   Weight: 182 lb 1 oz (82.6 kg)   Height: 5\' 7"  (1.702 m)     Body mass index is 28.52 kg/m.  Physical Exam  Constitutional: Patient appears well-developed and well-nourished. No distress.  HENT: Head: Normocephalic and atraumatic. Ears: B TMs ok, no erythema or effusion; Nose: Nose normal. Mouth/Throat: Oropharynx is clear and moist. No oropharyngeal exudate.  Eyes: Conjunctivae and EOM are normal. Pupils are equal, round, and reactive to light. No scleral icterus.  Neck: Normal range of motion. Neck supple. No JVD present. No thyromegaly present.  Cardiovascular: Normal rate, regular rhythm and normal heart sounds.  No murmur heard. No BLE edema. Pulmonary/Chest: Effort normal and breath sounds normal. No respiratory distress. Abdominal: Soft. Bowel sounds are normal, no distension. There is no tenderness. no masses Breast: no lumps or masses, no  nipple discharge or rashes FEMALE GENITALIA:  Not done RECTAL: not done Musculoskeletal: decrease rom, pain during palpation of lumbar spine, using a walker, negative straight leg raise, still has braces on both wrists Neurological: he is alert and oriented to person, place, and time. No cranial nerve deficit. Coordination, balance, strength Skin: Skin is warm and dry. No rash noted. No erythema.  Psychiatric: Patient has a normal mood and affect. Pressured speech. Judgment and thought content normal.  PHQ2/9: Depression screen [facility] 2/9 01/04/2016 08/01/2015 03/28/2015 12/20/2014  Decreased Interest 0 0 0 0  Down, Depressed, Hopeless 0 1 0 0  PHQ - 2 Score 0 1 0 0     Hearing Screening   Method: Audiometry   125Hz  250Hz  500Hz  1000Hz  2000Hz  3000Hz  4000Hz  6000Hz  8000Hz   Right ear:   Fail Fail Pass  Pass    Left ear:   Fail Pass Pass  Pass     Fall Risk: Fall Risk  01/04/2016 12/01/2015 08/01/2015 06/20/2015 03/28/2015  Falls in the past year? Yes Yes Yes No Yes  Number falls in past yr: 2 or more 2 or more 2 or more - 2 or more  Injury with Fall? Yes Yes Yes - Yes  Risk Factor Category  High Fall Risk - High Fall Risk - -  Risk for fall due to : - - History of fall(s);Impaired mobility;Impaired balance/gait - -  Follow up - - Falls evaluation completed;Falls prevention discussed - -     Functional Status Survey: Is the patient deaf or have difficulty hearing?: Yes Does the patient have difficulty seeing, even when wearing glasses/contacts?: No Does the patient have difficulty concentrating, remembering, or making decisions?: Yes Does the patient have difficulty walking or climbing stairs?: Yes Does the patient have difficulty dressing or bathing?: No Does the patient have difficulty doing errands alone such as visiting a doctor's office or shopping?: No    Assessment & Plan  1. Medicare annual wellness visit, subsequent  Discussed importance of 150 minutes of physical activity  weekly, eat two servings of fish weekly, eat one serving of tree nuts ( cashews, pistachios, pecans, almonds.Marland Kitchen) every other day, eat 6 servings of fruit/vegetables daily and drink plenty of water and avoid sweet beverages.   2. Encounter for screening mammogram for breast cancer  - MM Digital Screening; Future  3. Tachycardia  Resolved after she sat down  4. Hypertension, benign  Advised to take half pill and recheck bp in one month  5. Gait instability  - Misc. Devices [facility]) MISC;  1 each by Does not apply route daily.  Dispense: 1 each; Refill: 0  6. Frequent falls  - Misc. Devices [facility]) MISC; 1 each by Does not apply route daily.  Dispense: 1 each; Refill: 0  7. Failed hearing screening  - Ambulatory referral to ENT

## 2016-01-05 LAB — COMPREHENSIVE METABOLIC PANEL
ALBUMIN: 4.3 g/dL (ref 3.5–5.5)
ALK PHOS: 116 IU/L (ref 39–117)
ALT: 8 IU/L (ref 0–32)
AST: 15 IU/L (ref 0–40)
Albumin/Globulin Ratio: 1.7 (ref 1.2–2.2)
BILIRUBIN TOTAL: 0.3 mg/dL (ref 0.0–1.2)
BUN / CREAT RATIO: 13 (ref 9–23)
BUN: 15 mg/dL (ref 6–24)
CO2: 25 mmol/L (ref 18–29)
CREATININE: 1.15 mg/dL — AB (ref 0.57–1.00)
Calcium: 9.3 mg/dL (ref 8.7–10.2)
Chloride: 100 mmol/L (ref 96–106)
GFR calc non Af Amer: 54 mL/min/{1.73_m2} — ABNORMAL LOW (ref >59)
GFR, EST AFRICAN AMERICAN: 63 mL/min/{1.73_m2} (ref >59)
GLOBULIN, TOTAL: 2.6 g/dL (ref 1.5–4.5)
Glucose: 86 mg/dL (ref 65–99)
Potassium: 4.1 mmol/L (ref 3.5–5.2)
SODIUM: 142 mmol/L (ref 134–144)
TOTAL PROTEIN: 6.9 g/dL (ref 6.0–8.5)

## 2016-01-10 ENCOUNTER — Ambulatory Visit: Payer: Self-pay | Admitting: Family Medicine

## 2016-01-23 ENCOUNTER — Telehealth: Payer: Self-pay | Admitting: Family Medicine

## 2016-01-23 NOTE — Telephone Encounter (Signed)
Failed hearing screen but it has already been scheduled and closed according to the notes. I will mail her a copy of the results.

## 2016-01-23 NOTE — Telephone Encounter (Signed)
Very unlikely it will be approved. Usually only approved for patients that cannot walk

## 2016-01-24 NOTE — Telephone Encounter (Signed)
She will not qualify. I can explain to her on her next visit

## 2016-01-24 NOTE — Telephone Encounter (Signed)
Pt stated that she was told to ask you to look at all of her DX and consider her carpel tunnel. She would like for you to rewrite the script and she will work on getting it cleared with her insurance.

## 2016-01-26 ENCOUNTER — Telehealth: Payer: Self-pay | Admitting: Family Medicine

## 2016-02-01 ENCOUNTER — Telehealth: Payer: Self-pay | Admitting: Family Medicine

## 2016-02-01 DIAGNOSIS — H903 Sensorineural hearing loss, bilateral: Secondary | ICD-10-CM | POA: Diagnosis not present

## 2016-02-01 DIAGNOSIS — R42 Dizziness and giddiness: Secondary | ICD-10-CM | POA: Diagnosis not present

## 2016-02-01 NOTE — Telephone Encounter (Signed)
Spoke to pt and she requested a copy of current medication list and a copy of health screening she stated that was done during her CPE on 01/04/16. Did not find a copy of any health screening that had been done nor any mention of any paperwork in the documentation from the visit. But medication list printed and placed in the front for pick up.

## 2016-02-02 ENCOUNTER — Other Ambulatory Visit: Payer: Self-pay | Admitting: Family Medicine

## 2016-02-02 NOTE — Telephone Encounter (Signed)
Patient requesting refill of Nicotine patches sent to [facility].

## 2016-02-08 NOTE — Telephone Encounter (Signed)
done

## 2016-02-10 ENCOUNTER — Telehealth: Payer: Self-pay

## 2016-02-10 DIAGNOSIS — R42 Dizziness and giddiness: Secondary | ICD-10-CM | POA: Diagnosis not present

## 2016-02-10 NOTE — Telephone Encounter (Signed)
Pt called wanting to set up an appointment for endo which the referral was sent back in March. Called [facility] and they stated that pt no showed for 1st appointment and cancelled her 2nd one and that Dr. Maretta Bees would not be willing to see her at this point. Pt may need a new referral placed for her to see someone else at a different office. Pt will have to travel for another endocrinologist

## 2016-02-15 ENCOUNTER — Emergency Department
Admission: EM | Admit: 2016-02-15 | Discharge: 2016-02-15 | Disposition: A | Discharge status: Home / Self Care | Payer: Medicare Other

## 2016-02-15 DIAGNOSIS — R42 Dizziness and giddiness: Secondary | ICD-10-CM | POA: Diagnosis not present

## 2016-02-15 NOTE — ED Notes (Signed)
Pt notifies this RN that was not staying to be seen today, that there were too  Many sick people here. Pt states she never really planned on staying anyway. Pt a/ox2, nad, pt ambulated out of lobby with no acute distress.

## 2016-02-15 NOTE — ED Triage Notes (Addendum)
Pt to ed via ems with reports of sick call out to home for c/o weakness and dizziness. EMS reports vss at time of arrival.

## 2016-02-16 ENCOUNTER — Ambulatory Visit (INDEPENDENT_AMBULATORY_CARE_PROVIDER_SITE_OTHER): Payer: Medicare Other | Admitting: Family Medicine

## 2016-02-16 ENCOUNTER — Encounter: Payer: Self-pay | Admitting: Family Medicine

## 2016-02-16 VITALS — BP 95/74 | HR 107 | Temp 98.2°F | Resp 16 | Ht 67.0 in | Wt 185.3 lb

## 2016-02-16 DIAGNOSIS — R296 Repeated falls: Secondary | ICD-10-CM

## 2016-02-16 DIAGNOSIS — R2681 Unsteadiness on feet: Secondary | ICD-10-CM

## 2016-02-16 NOTE — Progress Notes (Signed)
Name: Samantha Macias   MRN: [id number]    DOB: 1961/04/08   Date:02/16/2016       Progress Note  Subjective  Chief Complaint  Chief Complaint  Patient presents with  . discuss power wheelchair    HPI  Gait instability, FMS, osteoarthritis hands: she is able to use a walker, she has a wheelchair at home, but states only uses at home because she does not have assistance when out of her house. She has been asking for an Dealer wheelchair ( power wheelchair ), explained that it does not seem like she would qualify for it. She had PT but was dismissed because she was not be ridden. She states that having an electrical wheelchair would improve her mobility inside her house, she states she would not use outside her house because she would not be able to pay for a lift. She is currently using her feet to move while sitting on her wheelchair.   She was at [facility] last night, she said she had a panic attack, she denies any chest pain or SOB   Patient Active Problem List   Diagnosis Date Noted  . Thyromegaly 08/18/2015  . Thyroid cyst 08/18/2015  . Osteoarthritis of both hands 08/01/2015  . Hypertension, benign 07/01/2015  . Hyperparathyroidism, secondary renal ([place]) 12/03/2014  . Allergic rhinitis 12/01/2014  . Anemia in chronic illness 12/01/2014  . GAD (generalized anxiety disorder) 12/01/2014  . Back pain, chronic 12/01/2014  . Bipolar affective disorder, currently depressed, mild ([place]) 12/01/2014  . Atherosclerosis of left carotid artery 12/01/2014  . Carpal tunnel syndrome 12/01/2014  . Chronic kidney disease (CKD), stage III (moderate) 12/01/2014  . Insomnia, persistent 12/01/2014  . Chronic nonmalignant pain 12/01/2014  . CN (constipation) 12/01/2014  . Kidney cysts 12/01/2014  . Cervical osteoarthritis 12/01/2014  . Dyslipidemia 12/01/2014  . Fibromyalgia syndrome 12/01/2014  . Abnormal gait 12/01/2014  . Gastro-esophageal reflux disease without esophagitis 12/01/2014  .  History of colon polyps 12/01/2014  . History of syncope 12/01/2014  . H/O transient cerebral ischemia 12/01/2014  . HPV test positive 12/01/2014  . Personal history of fall 12/01/2014  . Calculus of kidney 12/01/2014  . Lumbar radiculopathy 12/01/2014  . OCD (obsessive compulsive disorder) 12/01/2014  . Overweight 12/01/2014  . PTSD (post-traumatic stress disorder) 12/01/2014  . Trochanteric bursitis of both hips 12/01/2014  . 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 NASAL SINUS SURGERY    . TUBAL LIGATION      Family History  Problem Relation Age of Onset  . Peripheral vascular disease Mother   . Anxiety disorder Mother   . Depression Mother   . Thyroid disease Mother   . Congestive Heart Failure Father   . Alcohol abuse Father     Social History   Social History  . Marital status: Single    Spouse name: N/A  . Number of children: 2  . Years of education: N/A   Occupational History  . Not on file.   Social History Main Topics  . Smoking status: Current Every Day Smoker    Packs/day: 1.00    Years: 25.00    Types: Cigarettes  . Smokeless tobacco: Never Used  . Alcohol use No  . Drug use: No  . Sexual activity: Not Currently   Other Topics Concern  . Not on file   Social History Narrative  . No narrative on file     Current Outpatient Prescriptions:  .  ALPRAZolam (XANAX) 0.5 MG tablet, TAKE 1-1 1/2 TABLETS BY MOUTH EVERY NIGHT AT BEDTIME AS NEEDED FOR ANXIETY, Disp: 45 tablet, Rfl: 0 .  aspirin EC 81 MG tablet, Take 1 tablet (81 mg total) by mouth at bedtime., Disp: , Rfl:  .  atorvastatin (LIPITOR) 40 MG tablet, TAKE 1 TABLET(40 MG) BY MOUTH DAILY, Disp: 90 tablet, Rfl: 1 .  Cholecalciferol (VITAMIN D) 2000 units tablet, Take 2,000 Units by mouth daily., Disp: , Rfl:  .  diclofenac sodium (VOLTAREN) 1 % GEL, Apply 4 g topically 4 (four) times daily as needed., Disp: 100 g, Rfl: 5 .  ibuprofen  (ADVIL,MOTRIN) 600 MG tablet, Take 1 tablet (600 mg total) by mouth every 6 (six) hours as needed. (Patient not taking: Reported on 12/01/2015), Disp: 30 tablet, Rfl: 0 .  losartan-hydrochlorothiazide (HYZAAR) 100-12.5 MG tablet, Take 1 tablet by mouth daily., Disp: 90 tablet, Rfl: 1 .  Misc. Devices [facility]) MISC, 1 each by Does not apply route daily., Disp: 1 each, Rfl: 0 .  nicotine (NICODERM CQ - DOSED IN MG/24 HOURS) 21 mg/24hr patch, APPLY 1 PATCH EVERY DAY FOR 4 WEEKS, Disp: 28 patch, Rfl: 0 .  pregabalin (LYRICA) 200 MG capsule, Take 1 capsule (200 mg total) by mouth 3 (three) times daily., Disp: 90 capsule, Rfl: 5 .  ramelteon (ROZEREM) 8 MG tablet, Take 1 tablet (8 mg total) by mouth at bedtime., Disp: 90 tablet, Rfl: 1 .  ranitidine (ZANTAC) 150 MG tablet, Take 1 tablet (150 mg total) by mouth daily as needed for heartburn., Disp: 90 tablet, Rfl: 1 .  traMADol (ULTRAM) 50 MG tablet, Take 1 tablet (50 mg total) by mouth every 8 (eight) hours as needed., Disp: 90 tablet, Rfl: 0  Allergies  Allergen Reactions  . Shellfish Allergy Shortness Of Breath, Anaphylaxis and Nausea And Vomiting    Other reaction(s): Swollen lips  . Ace Inhibitors     cough  . Codeine Hives  . Nitrofurantoin Monohyd Macro Nausea Only  . Other Nausea And Vomiting    Tetanus  . Red Dye Itching  . Tetanus Toxoid Adsorbed      ROS  Ten systems reviewed and is negative except as mentioned in HPI   Objective  Vitals:   02/16/16 0939  BP: 95/74  Pulse: (!) 107  Resp: 16  Temp: 98.2 F (36.8 C)  SpO2: 97%  Weight: 185 lb 5 oz (84.1 kg)  Height: 5\' 7"  (1.702 m)    Body mass index is 29.02 kg/m.  Physical Exam  Constitutional: Patient appears well-developed and well-nourished. Obese  No distress.  HEENT: head atraumatic, normocephalic, pupils equal and reactive to light,  neck supple, throat within normal limits Cardiovascular: Normal rate, regular rhythm and normal heart sounds.  No murmur  heard. No BLE edema. Pulmonary/Chest: Effort normal and breath sounds normal. No respiratory distress. Abdominal: Soft.  There is no tenderness. Psychiatric: Patient has a normal mood and affect. behavior is normal. Judgment and thought content normal. Muscular skeletal: using a walker , trigger point positives  Recent Results (from the past 2160 hour(s))  Comprehensive metabolic panel     Status: Abnormal   Collection Time: 01/04/16 12:38 PM  Result Value Ref Range   Glucose 86 65 - 99 mg/dL   BUN 15 6 - 24 mg/dL   Creatinine, Ser 1.15 (H) 0.57 - 1.00 mg/dL   GFR calc non Af Amer 54 (L) >59 mL/min/1.73   GFR calc Af Amer 63 >59 mL/min/1.73  BUN/Creatinine Ratio 13 9 - 23   Sodium 142 134 - 144 mmol/L   Potassium 4.1 3.5 - 5.2 mmol/L   Chloride 100 96 - 106 mmol/L   CO2 25 18 - 29 mmol/L   Calcium 9.3 8.7 - 10.2 mg/dL   Total Protein 6.9 6.0 - 8.5 g/dL   Albumin 4.3 3.5 - 5.5 g/dL   Globulin, Total 2.6 1.5 - 4.5 g/dL   Albumin/Globulin Ratio 1.7 1.2 - 2.2   Bilirubin Total 0.3 0.0 - 1.2 mg/dL   Alkaline Phosphatase 116 39 - 117 IU/L   AST 15 0 - 40 IU/L   ALT 8 0 - 32 IU/L     PHQ2/9: Depression screen [facility] 2/9 02/16/2016 01/04/2016 08/01/2015 03/28/2015 12/20/2014  Decreased Interest 0 0 0 0 0  Down, Depressed, Hopeless 0 0 1 0 0  PHQ - 2 Score 0 0 1 0 0     Fall Risk: Fall Risk  02/16/2016 01/04/2016 12/01/2015 08/01/2015 06/20/2015  Falls in the past year? Yes Yes Yes Yes No  Number falls in past yr: 2 or more 2 or more 2 or more 2 or more -  Injury with Fall? Yes Yes Yes Yes -  Risk Factor Category  High Fall Risk High Fall Risk - High Fall Risk -  Risk for fall due to : - - - History of fall(s);Impaired mobility;Impaired balance/gait -  Follow up Falls evaluation completed - - Falls evaluation completed;Falls prevention discussed -      Functional Status Survey: Is the patient deaf or have difficulty hearing?: No Does the patient have difficulty seeing, even when  wearing glasses/contacts?: No Does the patient have difficulty concentrating, remembering, or making decisions?: Yes Does the patient have difficulty walking or climbing stairs?: Yes Does the patient have difficulty dressing or bathing?: No Does the patient have difficulty doing errands alone such as visiting a doctor's office or shopping?: No    Assessment & Plan   1. Gait instability  - Wheelchair  2. Frequent falls  - Wheelchair - she is requesting power wheelchair, explained that she can use a walker and I don't think it will be approved by her insurance

## 2016-02-17 DIAGNOSIS — I10 Essential (primary) hypertension: Secondary | ICD-10-CM | POA: Diagnosis not present

## 2016-02-17 DIAGNOSIS — M359 Systemic involvement of connective tissue, unspecified: Secondary | ICD-10-CM | POA: Insufficient documentation

## 2016-02-17 DIAGNOSIS — M2550 Pain in unspecified joint: Secondary | ICD-10-CM | POA: Diagnosis not present

## 2016-02-17 DIAGNOSIS — E782 Mixed hyperlipidemia: Secondary | ICD-10-CM | POA: Diagnosis not present

## 2016-02-17 DIAGNOSIS — M5416 Radiculopathy, lumbar region: Secondary | ICD-10-CM | POA: Diagnosis not present

## 2016-03-02 ENCOUNTER — Encounter: Payer: Self-pay | Admitting: Family Medicine

## 2016-03-02 ENCOUNTER — Other Ambulatory Visit: Payer: Self-pay | Admitting: Family Medicine

## 2016-03-02 ENCOUNTER — Ambulatory Visit (INDEPENDENT_AMBULATORY_CARE_PROVIDER_SITE_OTHER): Payer: Medicare Other | Admitting: Family Medicine

## 2016-03-02 VITALS — BP 122/78 | HR 100 | Temp 98.7°F | Resp 18 | Ht 67.0 in | Wt 187.4 lb

## 2016-03-02 DIAGNOSIS — L989 Disorder of the skin and subcutaneous tissue, unspecified: Secondary | ICD-10-CM | POA: Diagnosis not present

## 2016-03-02 DIAGNOSIS — B977 Papillomavirus as the cause of diseases classified elsewhere: Secondary | ICD-10-CM | POA: Diagnosis not present

## 2016-03-02 DIAGNOSIS — R2681 Unsteadiness on feet: Secondary | ICD-10-CM

## 2016-03-02 DIAGNOSIS — E041 Nontoxic single thyroid nodule: Secondary | ICD-10-CM | POA: Diagnosis not present

## 2016-03-02 DIAGNOSIS — Z23 Encounter for immunization: Secondary | ICD-10-CM

## 2016-03-02 DIAGNOSIS — R269 Unspecified abnormalities of gait and mobility: Secondary | ICD-10-CM

## 2016-03-02 DIAGNOSIS — M5416 Radiculopathy, lumbar region: Secondary | ICD-10-CM | POA: Diagnosis not present

## 2016-03-02 DIAGNOSIS — E049 Nontoxic goiter, unspecified: Secondary | ICD-10-CM

## 2016-03-02 DIAGNOSIS — R888 Abnormal findings in other body fluids and substances: Secondary | ICD-10-CM | POA: Diagnosis not present

## 2016-03-02 DIAGNOSIS — M797 Fibromyalgia: Secondary | ICD-10-CM | POA: Diagnosis not present

## 2016-03-02 DIAGNOSIS — E010 Iodine-deficiency related diffuse (endemic) goiter: Secondary | ICD-10-CM

## 2016-03-02 DIAGNOSIS — IMO0002 Reserved for concepts with insufficient information to code with codable children: Secondary | ICD-10-CM

## 2016-03-02 DIAGNOSIS — R413 Other amnesia: Secondary | ICD-10-CM

## 2016-03-02 NOTE — Addendum Note (Signed)
Addended by: Lolita Rieger D on: 03/02/2016 10:15 AM   Modules accepted: Orders

## 2016-03-02 NOTE — Telephone Encounter (Signed)
Patient requesting refill of Alprazolam to [facility].  

## 2016-03-02 NOTE — Progress Notes (Addendum)
Name: Samantha Macias   MRN: [id number]    DOB: [birth year]   Date:03/02/2016       Progress Note  Subjective  Chief Complaint  Chief Complaint  Patient presents with  . Fibromyalgia    3 month follow up  . wheelchair    pt wants forms filled out for her wheelchair  . Abnormal Pap Smear    HPI  Gait instability, FMS, osteoarthritis hands: she is able to use a walker, she has a wheelchair at home, but states only uses at home because she does not have assistance when out of her house. She has been asking for an Dealer wheelchair ( power wheelchair ), explained that it does not seem like she would qualify for it. She had PT but was dismissed because she was not be ridden. She states that having an electrical wheelchair would improve her mobility inside her house, she states she would not use outside her house because she would not be able to pay for a lift. She is currently using her feet to move while sitting on her wheelchair. She recently had hearing aids and per patient ENT wants her to see neurologist for further evaluation of balance and gait instability  HPV high risk : in 2014, we will check pap smear today  Thyroid cyst: she missed appointment at [facility] and no longer accepted as a patient therefore we will send her to Dr. Ronnald Collum.  Skin lesions: she states she has 3 spots, two on anterior chest and one on right arm over the past few months, they grown in size and goes down, but never completely flat, she would like to see a Dermatologist    Patient Active Problem List   Diagnosis Date Noted  . Connective tissue disease ([place]) 02/17/2016  . Arthralgia of multiple joints 02/17/2016  . Thyromegaly 08/18/2015  . Thyroid cyst 08/18/2015  . Osteoarthritis of both hands 08/01/2015  . Hypertension, benign 07/01/2015  . Hyperparathyroidism, secondary renal ([place]) 12/03/2014  . Allergic rhinitis 12/01/2014  . Anemia in chronic illness 12/01/2014  . GAD (generalized  anxiety disorder) 12/01/2014  . Back pain, chronic 12/01/2014  . Bipolar affective disorder, currently depressed, mild ([place]) 12/01/2014  . Atherosclerosis of left carotid artery 12/01/2014  . Carpal tunnel syndrome 12/01/2014  . Chronic kidney disease (CKD), stage III (moderate) 12/01/2014  . Insomnia, persistent 12/01/2014  . Chronic nonmalignant pain 12/01/2014  . CN (constipation) 12/01/2014  . Kidney cysts 12/01/2014  . Cervical osteoarthritis 12/01/2014  . Dyslipidemia 12/01/2014  . Fibromyalgia syndrome 12/01/2014  . Abnormal gait 12/01/2014  . Gastro-esophageal reflux disease without esophagitis 12/01/2014  . History of colon polyps 12/01/2014  . History of syncope 12/01/2014  . H/O transient cerebral ischemia 12/01/2014  . HPV test positive 12/01/2014  . Personal history of fall 12/01/2014  . Calculus of kidney 12/01/2014  . Lumbar radiculopathy 12/01/2014  . OCD (obsessive compulsive disorder) 12/01/2014  . Overweight 12/01/2014  . PTSD (post-traumatic stress disorder) 12/01/2014  . Trochanteric bursitis of both hips 12/01/2014  . 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 NASAL SINUS SURGERY    . TUBAL LIGATION      Family History  Problem Relation Age of Onset  . Peripheral vascular disease Mother   . Anxiety disorder Mother   . Depression Mother   . Thyroid disease Mother   . Congestive Heart Failure Father   . Alcohol abuse Father  Social History   Social History  . Marital status: Single    Spouse name: N/A  . Number of children: 2  . Years of education: N/A   Occupational History  . Not on file.   Social History Main Topics  . Smoking status: Current Every Day Smoker    Packs/day: 1.00    Years: 25.00    Types: Cigarettes  . Smokeless tobacco: Never Used  . Alcohol use No  . Drug use: No  . Sexual activity: Not Currently   Other Topics Concern  . Not on file   Social History  Narrative  . No narrative on file     Current Outpatient Prescriptions:  .  ALPRAZolam (XANAX) 0.5 MG tablet, TAKE 1-1 1/2 TABLETS BY MOUTH EVERY NIGHT AT BEDTIME AS NEEDED FOR ANXIETY, Disp: 45 tablet, Rfl: 0 .  aspirin EC 81 MG tablet, Take 1 tablet (81 mg total) by mouth at bedtime., Disp: , Rfl:  .  atorvastatin (LIPITOR) 40 MG tablet, TAKE 1 TABLET(40 MG) BY MOUTH DAILY, Disp: 90 tablet, Rfl: 1 .  Cholecalciferol (VITAMIN D) 2000 units tablet, Take 2,000 Units by mouth daily., Disp: , Rfl:  .  diclofenac sodium (VOLTAREN) 1 % GEL, Apply 4 g topically 4 (four) times daily as needed., Disp: 100 g, Rfl: 5 .  ibuprofen (ADVIL,MOTRIN) 600 MG tablet, Take 1 tablet (600 mg total) by mouth every 6 (six) hours as needed. (Patient not taking: Reported on 12/01/2015), Disp: 30 tablet, Rfl: 0 .  losartan-hydrochlorothiazide (HYZAAR) 100-12.5 MG tablet, Take 1 tablet by mouth daily., Disp: 90 tablet, Rfl: 1 .  Misc. Devices [facility]) MISC, 1 each by Does not apply route daily., Disp: 1 each, Rfl: 0 .  nicotine (NICODERM CQ - DOSED IN MG/24 HOURS) 21 mg/24hr patch, APPLY 1 PATCH EVERY DAY FOR 4 WEEKS, Disp: 28 patch, Rfl: 0 .  pregabalin (LYRICA) 200 MG capsule, Take 1 capsule (200 mg total) by mouth 3 (three) times daily., Disp: 90 capsule, Rfl: 5 .  ramelteon (ROZEREM) 8 MG tablet, Take 1 tablet (8 mg total) by mouth at bedtime., Disp: 90 tablet, Rfl: 1 .  ranitidine (ZANTAC) 150 MG tablet, Take 1 tablet (150 mg total) by mouth daily as needed for heartburn., Disp: 90 tablet, Rfl: 1 .  traMADol (ULTRAM) 50 MG tablet, Take 1 tablet (50 mg total) by mouth every 8 (eight) hours as needed., Disp: 90 tablet, Rfl: 0  Allergies  Allergen Reactions  . Shellfish Allergy Shortness Of Breath, Anaphylaxis and Nausea And Vomiting    Other reaction(s): Swollen lips  . Ace Inhibitors     cough  . Codeine Hives  . Nitrofurantoin Monohyd Macro Nausea Only  . Other Nausea And Vomiting    Tetanus  . Red Dye  Itching  . Tetanus Toxoid Adsorbed      ROS  Constitutional: Negative for fever or significant  weight change.  Respiratory: Negative for cough and shortness of breath.   Cardiovascular: Negative for chest pain or palpitations.  Gastrointestinal: Negative for abdominal pain, no bowel changes.  Musculoskeletal: Positive  for gait problem no  joint swelling.  Skin: Negative for rash. She has new lesions Neurological:Positive for intermittent dizziness but no headache.  No other specific complaints in a complete review of systems (except as listed in HPI above).  Objective  Vitals:   03/02/16 0851  BP: 122/78  Pulse: 100  Resp: 18  Temp: 98.7 F (37.1 C)  SpO2: 97%  Weight: 187 lb  6 oz (85 kg)  Height: 5\' 7"  (1.702 m)    Body mass index is 29.35 kg/m.  Physical Exam  Constitutional: Patient appears well-developed . Obese  No distress.  HEENT: head atraumatic, normocephalic, pupils equal and reactive to light,  neck supple, throat within normal limits, thyroid slightly enlarged Cardiovascular: Normal rate, regular rhythm and normal heart sounds.  No murmur heard. No BLE edema. Pulmonary/Chest: Effort normal and breath sounds normal. No respiratory distress. Abdominal: Soft.  There is no tenderness. Psychiatric: Patient has pressure speech, Judgment and thought content normal. Muscular skeletal: using a cane , trigger point positives Skin: two round papules on anterior chest that is erythematous GYN: normal pelvic exam, pap smear collected   Recent Results (from the past 2160 hour(s))  Comprehensive metabolic panel     Status: Abnormal   Collection Time: 01/04/16 12:38 PM  Result Value Ref Range   Glucose 86 65 - 99 mg/dL   BUN 15 6 - 24 mg/dL   Creatinine, Ser 1.15 (H) 0.57 - 1.00 mg/dL   GFR calc non Af Amer 54 (L) >59 mL/min/1.73   GFR calc Af Amer 63 >59 mL/min/1.73   BUN/Creatinine Ratio 13 9 - 23   Sodium 142 134 - 144 mmol/L   Potassium 4.1 3.5 - 5.2 mmol/L    Chloride 100 96 - 106 mmol/L   CO2 25 18 - 29 mmol/L   Calcium 9.3 8.7 - 10.2 mg/dL   Total Protein 6.9 6.0 - 8.5 g/dL   Albumin 4.3 3.5 - 5.5 g/dL   Globulin, Total 2.6 1.5 - 4.5 g/dL   Albumin/Globulin Ratio 1.7 1.2 - 2.2   Bilirubin Total 0.3 0.0 - 1.2 mg/dL   Alkaline Phosphatase 116 39 - 117 IU/L   AST 15 0 - 40 IU/L   ALT 8 0 - 32 IU/L     PHQ2/9: Depression screen [facility] 2/9 03/02/2016 02/16/2016 01/04/2016 08/01/2015 03/28/2015  Decreased Interest 0 0 0 0 0  Down, Depressed, Hopeless 0 0 0 1 0  PHQ - 2 Score 0 0 0 1 0    Fall Risk: Fall Risk  03/02/2016 02/16/2016 01/04/2016 12/01/2015 08/01/2015  Falls in the past year? Yes Yes Yes Yes Yes  Number falls in past yr: 2 or more 2 or more 2 or more 2 or more 2 or more  Injury with Fall? Yes Yes Yes Yes Yes  Risk Factor Category  High Fall Risk High Fall Risk High Fall Risk - High Fall Risk  Risk for fall due to : - - - - History of fall(s);Impaired mobility;Impaired balance/gait  Follow up - Falls evaluation completed - - Falls evaluation completed;Falls prevention discussed      Functional Status Survey: Is the patient deaf or have difficulty hearing?: Yes (hearing aids) Does the patient have difficulty seeing, even when wearing glasses/contacts?: No Does the patient have difficulty concentrating, remembering, or making decisions?: Yes Does the patient have difficulty walking or climbing stairs?: Yes Does the patient have difficulty dressing or bathing?: Yes Does the patient have difficulty doing errands alone such as visiting a doctor's office or shopping?: Yes    Assessment & Plan  1. HPV test positive  - PapLb, HPV, rfx16/18 Back in 2014, with normal cytology   2. Gait instability  - Ambulatory referral to Neurology  3. Fibromyalgia syndrome  Stable - not willing to decrease Lyrica as recommended by psychiatrist  4. Abnormal gait  - Ambulatory referral to Neurology  5. Memory loss  -  Ambulatory referral  to Neurology  6. Thyroid cyst  - Ambulatory referral to Endocrinology  7. Thyromegaly  - Ambulatory referral to Endocrinology  8. Skin lesion  - Ambulatory referral to Dermatology  9. Need for influenza vaccination  - Flu Vaccine QUAD 36+ mos PF IM (Fluarix & Fluzone Quad PF)  Patient came in to our office threatening litigation because we sent a copy of her notes to a medical supplier ( it was requested by the medical supply company). We showed her the request and unfortunately it has happened before and we will have to dismiss her from our practice

## 2016-03-06 ENCOUNTER — Other Ambulatory Visit: Payer: Self-pay | Admitting: Internal Medicine

## 2016-03-06 DIAGNOSIS — M5416 Radiculopathy, lumbar region: Secondary | ICD-10-CM

## 2016-03-06 DIAGNOSIS — R269 Unspecified abnormalities of gait and mobility: Secondary | ICD-10-CM

## 2016-03-09 ENCOUNTER — Telehealth: Payer: Self-pay | Admitting: Family Medicine

## 2016-03-09 NOTE — Telephone Encounter (Signed)
You will have to ask Tashia due to her being the last one to work with this patient the past couple of times she has been here.

## 2016-03-09 NOTE — Telephone Encounter (Signed)
It is the second time this happens. We need to label all containers.

## 2016-03-09 NOTE — Telephone Encounter (Signed)
Kim from [employer] they received patient pap but is unable to process due to not having her name on the valve. She will be sending it back via [employer] (P) [phone]

## 2016-03-15 ENCOUNTER — Ambulatory Visit
Admission: RE | Admit: 2016-03-15 | Discharge: 2016-03-15 | Disposition: A | Discharge status: Home / Self Care | Payer: Medicare Other | Source: Ambulatory Visit | Attending: Internal Medicine | Admitting: Internal Medicine

## 2016-03-15 DIAGNOSIS — M5126 Other intervertebral disc displacement, lumbar region: Secondary | ICD-10-CM | POA: Insufficient documentation

## 2016-03-15 DIAGNOSIS — M545 Low back pain: Secondary | ICD-10-CM | POA: Diagnosis not present

## 2016-03-15 DIAGNOSIS — R269 Unspecified abnormalities of gait and mobility: Secondary | ICD-10-CM | POA: Insufficient documentation

## 2016-03-15 DIAGNOSIS — M5136 Other intervertebral disc degeneration, lumbar region: Secondary | ICD-10-CM | POA: Insufficient documentation

## 2016-03-15 DIAGNOSIS — M5416 Radiculopathy, lumbar region: Secondary | ICD-10-CM | POA: Insufficient documentation

## 2016-03-20 ENCOUNTER — Telehealth: Payer: Self-pay | Admitting: General Practice

## 2016-03-20 NOTE — Telephone Encounter (Signed)
Pt would like to know results of pap smear and also once she gets those results if a copy could be mailed to her.

## 2016-03-21 NOTE — Telephone Encounter (Signed)
Spoke to pt and gave her the results and put a copy into the mail. Pt would like to know the status of her referrals and I informed her that I would send a message to the referral coordinator

## 2016-03-27 ENCOUNTER — Ambulatory Visit (INDEPENDENT_AMBULATORY_CARE_PROVIDER_SITE_OTHER): Payer: Medicare Other | Admitting: Family Medicine

## 2016-03-27 ENCOUNTER — Encounter: Payer: Self-pay | Admitting: Family Medicine

## 2016-03-27 VITALS — BP 109/76 | HR 84 | Temp 98.2°F | Resp 16 | Ht 68.0 in | Wt 185.6 lb

## 2016-03-27 DIAGNOSIS — R296 Repeated falls: Secondary | ICD-10-CM

## 2016-03-27 DIAGNOSIS — M25552 Pain in left hip: Secondary | ICD-10-CM

## 2016-03-27 DIAGNOSIS — Z7689 Persons encountering health services in other specified circumstances: Secondary | ICD-10-CM

## 2016-03-27 DIAGNOSIS — M25551 Pain in right hip: Secondary | ICD-10-CM

## 2016-03-27 DIAGNOSIS — M5442 Lumbago with sciatica, left side: Secondary | ICD-10-CM

## 2016-03-27 DIAGNOSIS — F419 Anxiety disorder, unspecified: Secondary | ICD-10-CM

## 2016-03-27 DIAGNOSIS — I10 Essential (primary) hypertension: Secondary | ICD-10-CM

## 2016-03-27 DIAGNOSIS — G4700 Insomnia, unspecified: Secondary | ICD-10-CM

## 2016-03-27 DIAGNOSIS — E559 Vitamin D deficiency, unspecified: Secondary | ICD-10-CM

## 2016-03-27 DIAGNOSIS — M359 Systemic involvement of connective tissue, unspecified: Secondary | ICD-10-CM | POA: Diagnosis not present

## 2016-03-27 DIAGNOSIS — K219 Gastro-esophageal reflux disease without esophagitis: Secondary | ICD-10-CM | POA: Diagnosis not present

## 2016-03-27 DIAGNOSIS — G894 Chronic pain syndrome: Secondary | ICD-10-CM | POA: Diagnosis not present

## 2016-03-27 DIAGNOSIS — M25561 Pain in right knee: Secondary | ICD-10-CM | POA: Diagnosis not present

## 2016-03-27 DIAGNOSIS — G8929 Other chronic pain: Secondary | ICD-10-CM

## 2016-03-27 DIAGNOSIS — M797 Fibromyalgia: Secondary | ICD-10-CM | POA: Diagnosis not present

## 2016-03-27 DIAGNOSIS — G5603 Carpal tunnel syndrome, bilateral upper limbs: Secondary | ICD-10-CM

## 2016-03-27 DIAGNOSIS — M25562 Pain in left knee: Secondary | ICD-10-CM

## 2016-03-27 DIAGNOSIS — N183 Chronic kidney disease, stage 3 unspecified: Secondary | ICD-10-CM

## 2016-03-27 DIAGNOSIS — M5441 Lumbago with sciatica, right side: Secondary | ICD-10-CM

## 2016-03-27 DIAGNOSIS — M2550 Pain in unspecified joint: Secondary | ICD-10-CM

## 2016-03-27 DIAGNOSIS — E782 Mixed hyperlipidemia: Secondary | ICD-10-CM

## 2016-03-27 MED ORDER — PREGABALIN 150 MG PO CAPS: 150.0000 mg | ORAL_CAPSULE | Freq: Three times a day (TID) | ORAL | 5 refills | Status: DC

## 2016-03-27 MED ORDER — RAMELTEON 8 MG PO TABS: 8.0000 mg | ORAL_TABLET | Freq: Every day | ORAL | 1 refills | Status: DC

## 2016-03-27 MED ORDER — DICLOFENAC SODIUM 1 % TD GEL: 4.0000 g | Freq: Four times a day (QID) | TRANSDERMAL | 5 refills | Status: DC | PRN

## 2016-03-27 MED ORDER — TIZANIDINE HCL 2 MG PO TABS: 2.0000 mg | ORAL_TABLET | Freq: Every day | ORAL | 5 refills | Status: DC

## 2016-03-27 NOTE — Patient Instructions (Signed)
Thank you for coming in to clinic today.   1. Refilled Lyrica down to 150mg  3 times a day, 90 pills for 30 day supply with 5 refills - Refilled Tizanidine, Rozerem, Voltaren topical  2. Follow-up with Neurology as planned for carpal tunnel and numbness in legs, also for frequent falls and concerns with memory loss - They may recommend a good Psychiatrist  Options for Psychiatry for medication management, including the Xanax as discussed that I do not prescribe.  Self Referral (do NOT need referral ordered) [facility]) [place] [address] [place], [place] [address] Phone: [phone]  [facility], available walk-in 9am-4pm M-F [place], [place] [address] Hours: [place] (M-F, walk in available) [phone]   Address: [place], [place], [place] [address] Hours: 8AM-5PM (accepts walk in to establish) Phone: [phone]  In future can discuss Cymbalta and consider Tramadol as needed.  Follow-up with Rheumatology to discuss connective tissue / lupus treatments with prednisone. I do not recommend starting prednisone without further consulting Rheumatology.  Please schedule a follow-up appointment with Dr. Parks Ranger in 4 to 6 weeks to follow-up Chronic Pain, follow-up Rheumatology  If you have any other questions or concerns, please feel free to call the clinic or send a message through [place]. You may also schedule an earlier appointment if necessary.  Nobie Putnam, DO [place]

## 2016-03-27 NOTE — Progress Notes (Addendum)
Subjective:    Patient ID: Samantha Macias, female    DOB: 08/14/1960, 55 y.o.   MRN: [id number]  Samantha Macias is a 55 y.o. female presenting on 03/27/2016 for Establish Care (obtw pt fell today an hour ago)  Previously established with PCP at [facility] , prior established >30 years, then with Dr Ancil Boozer for 10-12 years, now here to establish care due to concerns about release of medical information to family.  Healthcare Home Aide present for most of encounter, patient interviewed confidentially briefly as well.  HPI   Specialists - Dr Meda Coffee (Rheumatology - [facility]) >> to see neurology - Dr Gigi Gin (Cardiology - [facility]) Neurologist 06/2016 - [place] (unsure which, last >1 year ago, had broken wrist) - Dr Yong Channel (Nephrology - [facility]) - Dr Rolena Infante (Urology [facility], only saw once) - Dr Meade Maw ENT? 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ology 02/2016, chart review shows prior diagnosis >8 yr ago by Dr Dossie Der based on malar rash, positive ANA and treatment with Prednisone and Plaquenil for many years. Now has had additional lab work-up per Rheum, questioning initial diagnosis, concern with proximal lower extremity muscle weakness relating to her poor gait and recurrent falls. 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 Then went to Pain Clinic at [facility] without good results - Admits to trying [facility] about 2 weeks ago, thought this may help pain, but did not notice much benefit, stopped using, denies any other substance or drug use - She requests refills on all medications today, despite establishing care - State she feels best on prednisone but her prior providers would not regularly rx this - Admits concern with memory loss  Recurrent Falls / Abnormal Gait / Lower Extremity Weakness / Lumbar Back Pain - She was referred to Neurology recently, waiting to establish with Dr Manuella Ghazi [facility]) on 04/02/16, she has had MRI imaging recently Lumbar spine, see results below. - Chronic history of difficulty with ambulation and has been in wheelchair now - Admits dizziness, changing position  Anxiety / Insomnia - Chronic problem, additionally with record of psychiatric history (on chart review of OCD and Bipolar), currently only anti anxiety med is Xanax1 to 1.5 nightly PRN, she is running low and requesting refill today, has been on this for many years, periodically, she can take one every 3-5 days as needed, has not had symptoms of withdrawal. No other current meds for anxiety / mood. Family history of depression / Bipolar - Previously at [facility], did not like overmedication - Previously referred to [facility], had established previously for anxiety, but did not plan to follow-up -  Regarding insomnia, pain keeps her awake usually. Takes Ramelteon 8mg  nightly as needed, sometimes, on for about 2 years, can go up to 1 week without. Request refill today - Prior on Ambien about 2 years then switch to  [place] / H/o TIAs - Last lipid panel 07/2015 well controlled, on atorvastatin 40mg  daily, tolerating well without myalgias - Taking ASA 81mg  twice a day, previously only daily but admits to prior history of recurrent TIAs >20 years ago  Vitamin D Deficiency - Prior history of low Vitamin D. Currently taking Vitamin D3, 2,000 iu daily  CHRONIC HTN: Reports no new concerns. Followed by Cardiology. Current Meds - Losartan-HCTZ 100-12.5mg    Reports good compliance, took meds today. Tolerating well, w/o complaints.  Health Maintenance: - Last Pap smear, 03/03/16 - Last Mammogram 01/2016 - Last BMD test 2014 - Last Pneumonia Vaccine 2015 - Last colonoscopy 04/2013   Past Medical History:  Diagnosis Date  . Abnormal gait   . Allergy   . Anxiety   . Arthritis   . CAD (coronary artery disease)   . Chronic kidney disease   . Colon polyp   . Fibromyalgia   . GERD (gastroesophageal reflux disease)   . Hyperlipidemia   . Hypertension   . IBS (irritable bowel syndrome)   . Kidney disease   . Migraine   . Spinal stenosis   . TIA (transient ischemic attack)    Social History   Social History  . Marital status: Single    Spouse name: N/A  . Number of children: 2  . Years of education: College Degree   Occupational History  . Not on file.   Social History Main Topics  . Smoking status: Current Every Day Smoker    Packs/day: 0.50    Years: 25.00    Types: Cigarettes  . Smokeless tobacco: Never Used  . Alcohol use No  . Drug use: No  . Sexual activity: Not Currently   Other Topics Concern  . Not on file   Social History Narrative  . No narrative on file   Family History  Problem Relation Age of Onset  . Congestive Heart Failure Father   . Alcohol abuse Father   . Heart disease Father   . Heart attack Father   . Peripheral vascular disease Mother   . Anxiety disorder Mother   . Depression Mother   . Thyroid disease Mother   . Heart disease Mother   .  Mental illness Mother   . Bipolar disorder Mother    Current Outpatient Prescriptions on File Prior to Visit  Medication Sig  . ALPRAZolam (XANAX) 0.5 MG tablet TAKE 1 TO 1&1/2 TABLETS BY MOUTH EVERY NIGHT AT BEDTIME AS NEEDED FOR ANXIETY  . aspirin EC 81 MG tablet Take 1 tablet (81 mg total) by mouth at bedtime.  Marland Kitchen atorvastatin (LIPITOR) 40 MG tablet TAKE 1 TABLET(40 MG) BY MOUTH DAILY  . Cholecalciferol (VITAMIN D) 2000 units tablet Take 2,000 Units by mouth daily.  Marland Kitchen ibuprofen (ADVIL,MOTRIN) 600 MG tablet Take 1 tablet (600 mg total) by mouth every 6 (six) hours as needed.  Marland Kitchen losartan-hydrochlorothiazide (HYZAAR) 100-12.5 MG tablet Take 1 tablet by mouth daily.  . Misc. Devices [facility]) MISC 1 each by Does not apply route daily.  . nicotine (NICODERM CQ - DOSED IN MG/24 HOURS) 21 mg/24hr patch APPLY 1 PATCH EVERY DAY FOR 4 WEEKS  . ranitidine (ZANTAC) 150 MG tablet Take 1 tablet (150 mg total) by mouth daily as  needed for heartburn.  . traMADol (ULTRAM) 50 MG tablet Take 1 tablet (50 mg total) by mouth every 8 (eight) hours as needed.   No current facility-administered medications on file prior to visit.     Review of Systems  Constitutional: Negative for activity change, appetite change, chills, diaphoresis, fatigue and fever.  HENT: Negative for congestion and hearing loss.   Eyes: Negative for visual disturbance.  Respiratory: Negative for cough, chest tightness, shortness of breath and wheezing.   Cardiovascular: Negative for chest pain, palpitations and leg swelling.  Gastrointestinal: Negative for abdominal pain, anal bleeding, blood in stool, constipation, diarrhea, nausea and vomiting.  Endocrine: Negative for polydipsia and polyuria.  Genitourinary: Negative for difficulty urinating, dysuria, frequency and hematuria.  Musculoskeletal: Positive for arthralgias (bilateral hands, wrist, shoulders, hips, knees), back pain, gait problem, joint swelling (none actively, with  lupus flares) and myalgias. Negative for neck pain.  Skin: Negative for rash.  Allergic/Immunologic: Negative for environmental allergies.  Neurological: Positive for dizziness (intermittent, not currently) and light-headedness. Negative for tremors, syncope, weakness, numbness and headaches.  Hematological: Negative for adenopathy.  Psychiatric/Behavioral: Positive for sleep disturbance. Negative for agitation, behavioral problems, decreased concentration, dysphoric mood, hallucinations, self-injury and suicidal ideas. The patient is nervous/anxious. The patient is not hyperactive.    Per HPI unless specifically indicated above  Depression screen [facility] 2/9 03/02/2016 02/16/2016 01/04/2016  Decreased Interest 0 0 0  Down, Depressed, Hopeless 0 0 0  PHQ - 2 Score 0 0 0      Objective:    BP 109/76   Pulse 84   Temp 98.2 F (36.8 C) (Oral)   Resp 16   Ht 5\' 8"  (1.727 m)   Wt 185 lb 9.6 oz (84.2 kg)   BMI 28.22 kg/m   Wt Readings from Last 3 Encounters:  03/27/16 185 lb 9.6 oz (84.2 kg)  03/02/16 187 lb 6 oz (85 kg)  02/16/16 185 lb 5 oz (84.1 kg)    Physical Exam  Constitutional: She is oriented to person, place, and time. She appears well-developed and well-nourished. No distress.  Chronically ill appearing, mostly comfortable, cooperative, talkative, seated in wheelchair  HENT:  Head: Normocephalic and atraumatic.  Mouth/Throat: Oropharynx is clear and moist.  Eyes: Conjunctivae and EOM are normal. Pupils are equal, round, and reactive to light.  Neck: Normal range of motion. Neck supple. No thyromegaly present.  Cardiovascular: Normal rate, regular rhythm, normal heart sounds and intact distal pulses.   No murmur heard. Pulmonary/Chest: Effort normal and breath sounds normal. No respiratory distress. She has no wheezes. She has no rales.  Abdominal: Soft. Bowel sounds are normal. She exhibits no distension and no mass. There is no tenderness.  Musculoskeletal: Normal range of  motion. She exhibits no edema.  Back normal without deformity or abnormal curvature.  Upper / Lower Extremities: - Normal muscle tone, strength bilateral upper extremities 5/5 - Reduced strength lower extremities bilateral with knee flexion / hip flexion 3/5 with some reduced effort in wheelchair, ankle dorsi/plantar flexion 5/5 symmetrical  - Bilateral shoulders, knees, wrist, ankles without deformity, tenderness, effusion  Gait not tested today, remained in wheelchair  Lymphadenopathy:    She has no cervical adenopathy.  Neurological: She is alert and oriented to person, place, and time. No cranial nerve deficit.  Distal sensation to light touch is currently intact bilateral lower extremities.  Skin: Skin is warm and dry. No rash noted. She is not diaphoretic.  Psychiatric: She has a normal mood and affect.  Her behavior is normal.  Well groomed, good eye contact. Does have some mild pressured and rapid speech, and frequently changes subject but not necessarily tangential speech, as she is still related to topic. Mood is appropriate. Thought content appropriate.  Nursing note and vitals reviewed.     I have personally reviewed the radiology report from Lumbar MRI Spine on 03/15/16.  IMPRESSION: L1-2 small right paracentral protrusion/spur with mild impression right ventral thecal sac. Appearance unchanged.  L5-S1 left foraminal/lateral bulge and spur with slight encroachment upon but not compression of the exiting left L5 nerve root. Degenerative endplate reactive changes greater on the left.  L4-5 bulge/ spur with lateral extension slightly greater to left approaching but not compressing the exiting L4 nerve roots. Multifactorial mild to slightly moderate narrowing right lateral aspect of thecal sac with mild narrowing left lateral aspect of thecal sac. No significant central canal stenosis.  L3-4 mild left lateral bulge approaching but not compressing the exiting left L3  nerve root.  ---------------------- I have personally reviewed the following lab results from 01/04/16.   Results for orders placed or performed in visit on 01/04/16  Comprehensive metabolic panel  Result Value Ref Range   Glucose 86 65 - 99 mg/dL   BUN 15 6 - 24 mg/dL   Creatinine, Ser 1.15 (H) 0.57 - 1.00 mg/dL   GFR calc non Af Amer 54 (L) >59 mL/min/1.73   GFR calc Af Amer 63 >59 mL/min/1.73   BUN/Creatinine Ratio 13 9 - 23   Sodium 142 134 - 144 mmol/L   Potassium 4.1 3.5 - 5.2 mmol/L   Chloride 100 96 - 106 mmol/L   CO2 25 18 - 29 mmol/L   Calcium 9.3 8.7 - 10.2 mg/dL   Total Protein 6.9 6.0 - 8.5 g/dL   Albumin 4.3 3.5 - 5.5 g/dL   Globulin, Total 2.6 1.5 - 4.5 g/dL   Albumin/Globulin Ratio 1.7 1.2 - 2.2   Bilirubin Total 0.3 0.0 - 1.2 mg/dL   Alkaline Phosphatase 116 39 - 117 IU/L   AST 15 0 - 40 IU/L   ALT 8 0 - 32 IU/L      Assessment & Plan:   Problem List Items Addressed This Visit    Vitamin D deficiency    H/o Vit D deficiency, no recent lab value May continue maintenance dose Vitamin D3 2,000iu daily      Recurrent falls    Worsening, chronic problem. Significant lower extremity bilateral proximal muscle weakness Remains in wheelchair Recent Lumbar MRI with variable findings of DJD and disc bulges To establish with Neurology [facility] 10/30, follow-up      Insomnia, persistent    Chronic problem, multifactorial with chronic pain syndrome (fibro, possible connective tissue disease), and anxiety / mood disorder - Refilled Ramelton at this time, cautious about future refills on ambien, concern with polypharmacy and oversedation with frequent falls      Relevant Medications   tiZANidine (ZANAFLEX) 2 MG tablet   ramelteon (ROZEREM) 8 MG tablet   Hypertension, benign    Stable, well controlled. Followed by Cardiology Continue current regimen      HLD (hyperlipidemia)    Controlled on Atorvastatin 40mg  daily      Fibromyalgia syndrome    See  chronic pain Refilled Lyrica today, lowered dose to 150mg  TID, from prior 200 TID Refilled Tizanidine Refilled Ramelton for sleep Defer tramadol refill today, established care, follow-up in future will do UDS and consider pain contract vs referral to pain  management      Relevant Medications   pregabalin (LYRICA) 150 MG capsule   Connective tissue disease ([place])    Unclear exact diagnosis, previous diagnosis lupus >8 years ago from prior rheum variety of symptoms with malar rash, arthralgia, positive ANA. Now re-established with [facility] 02/2016, had extensive labs done, unremarkable for auto immune or lupus etiology. She has been off prednisone and plaquenil for a while, previously most improved on bursts of prednisone. Per Rheum recently unlikely dermatomyositis given no evidence. - Concern remains with prox lower extremity weakness, recurrent falls, chronic pain / fibro - Additionally, I don't see any charted history of lupus affecting her cardiac or renal systems, despite patient reported today - Pending evaluation by Neurology 10/30 - Continue current pain management Tizanidine, Tramadol, Lyrica - Follow-up in future, may need future trial on prednisone if flare      Relevant Medications   tiZANidine (ZANAFLEX) 2 MG tablet   Chronic pain syndrome    Currently stable on regimen with Lyrica high dose 200mg  TID, Tizanidine, Tramadol only 50mg  nightly. Secondary to fibromyalgia, and possible connective tissue disorder, unclear about prior history of lupus given recent rheum work-up appears unremarkable. - Reviewed [place] CSRS today on 03/28/16 record from past 1 year, all rx appear appropriate monthly intervals from prior PCP Dr Ancil Boozer. No red flags  - Discussed controlled substance rx for chronic pain, given she is establishing care today, do not plan to refill her Tramadol at this time. She is to finish further evaluation with Rheumatology, Neurology, and follow-up in future - Offered  UDS screening today, she admits to recent one time THC use, and agreement to hold off on this now, and if we are pursuing chronic pain management here then we will obtain UDS first, discuss contract, and determine if temporary Tramadol coverage vs referral to alternative chronic pain clinic. - I advised her that I do not recommend any stronger opiate pain medications or higher dose tramadol given significant concerns right now with recurrent falls, sedating medications      Relevant Medications   pregabalin (LYRICA) 150 MG capsule   diclofenac sodium (VOLTAREN) 1 % GEL   tiZANidine (ZANAFLEX) 2 MG tablet   Chronic kidney disease (CKD), stage III (moderate)    Stable, followed by Nephrology      Carpal tunnel syndrome    Chronic bilateral problem. Using wrist supports regularly. Taking Lyrica Follow-up ortho in future if consider surgical release      Relevant Medications   pregabalin (LYRICA) 150 MG capsule   tiZANidine (ZANAFLEX) 2 MG tablet   ramelteon (ROZEREM) 8 MG tablet   Back pain, chronic    Stable, chronic. Fibromyalgia, possible connective tissue disease - Continue current pain regimen, with Lyrica, muscle relaxants      Relevant Medications   pregabalin (LYRICA) 150 MG capsule   diclofenac sodium (VOLTAREN) 1 % GEL   tiZANidine (ZANAFLEX) 2 MG tablet   Arthralgia of multiple joints    Chronic history of recurrent flares of bilateral joint pains, mostly wrists/hands, also knees, hips and large joints. Prior dx lupus, variety of treatments. Now followed by Rheumatology at [facility], lab work-up unremarkable for lupus or connective tissue disease. - Continue current pain management for Fibromyalgia      Relevant Medications   diclofenac sodium (VOLTAREN) 1 % GEL   Anxiety    Chronic problem, does not seem well controlled currently, worse recently with variable medical conditions with chronic pain, fibro, recurrent falls now. -  improved with PRN xanax, but not  ideal chronic control, has been on benzo for years, also prior on clonazepam since discontinued - Concern with prior history of Bipolar and OCD per chart review, fam history of bipolar  Plan: 1. Advised patient that I would not recommend continuing Xanax for anxiety, ideally she would need different long term anti-anxiety or mood stabilizing medication. Given her complex history, advised her that she needs to establish with Psychiatry, currently receives therapy from from [facility], and given resources today of various walk in clinics to establish - Counseled on tapering her remaining xanax, red flags if withdrawal may seek treatment in emergency department, no prior acute BDZ withdrawal in past, reportedly can skip several days without symptoms       Other Visit Diagnoses    Encounter to establish care with new doctor    -  Primary   Gastroesophageal reflux disease without esophagitis       Chronic pain of both knees       Relevant Medications   pregabalin (LYRICA) 150 MG capsule   tiZANidine (ZANAFLEX) 2 MG tablet   Hip pain, bilateral          Meds ordered this encounter  Medications  . pregabalin (LYRICA) 150 MG capsule    Sig: Take 1 capsule (150 mg total) by mouth 3 (three) times daily.    Dispense:  90 capsule    Refill:  5  . diclofenac sodium (VOLTAREN) 1 % GEL    Sig: Apply 4 g topically 4 (four) times daily as needed.    Dispense:  100 g    Refill:  5  . tiZANidine (ZANAFLEX) 2 MG tablet    Sig: Take 1 tablet (2 mg total) by mouth at bedtime.    Dispense:  30 tablet    Refill:  5  . ramelteon (ROZEREM) 8 MG tablet    Sig: Take 1 tablet (8 mg total) by mouth at bedtime.    Dispense:  90 tablet    Refill:  1      Follow up plan: Return in about 4 weeks (around 04/24/2016) for chronic pain, rheumatology f/u.  Nobie Putnam, [facility] 03/28/2016, 6:41 PM

## 2016-03-28 ENCOUNTER — Encounter: Payer: Self-pay | Admitting: Family Medicine

## 2016-03-28 DIAGNOSIS — E559 Vitamin D deficiency, unspecified: Secondary | ICD-10-CM | POA: Insufficient documentation

## 2016-03-28 DIAGNOSIS — E785 Hyperlipidemia, unspecified: Secondary | ICD-10-CM | POA: Insufficient documentation

## 2016-03-28 DIAGNOSIS — F419 Anxiety disorder, unspecified: Secondary | ICD-10-CM | POA: Insufficient documentation

## 2016-03-28 NOTE — Assessment & Plan Note (Addendum)
Stable, well controlled. Followed by Cardiology Continue current regimen

## 2016-03-28 NOTE — Assessment & Plan Note (Signed)
H/o Vit D deficiency, no recent lab value May continue maintenance dose Vitamin D3 2,000iu daily

## 2016-03-28 NOTE — Assessment & Plan Note (Addendum)
Unclear exact diagnosis, previous diagnosis lupus >8 years ago from prior rheum variety of symptoms with malar rash, arthralgia, positive ANA. Now re-established with [facility] 02/2016, had extensive labs done, unremarkable for auto immune or lupus etiology. She has been off prednisone and plaquenil for a while, previously most improved on bursts of prednisone. Per Rheum recently unlikely dermatomyositis given no evidence. - Concern remains with prox lower extremity weakness, recurrent falls, chronic pain / fibro - Additionally, I don't see any charted history of lupus affecting her cardiac or renal systems, despite patient reported today - Pending evaluation by Neurology 10/30 - Continue current pain management Tizanidine, Tramadol, Lyrica - Follow-up in future, may need future trial on prednisone if flare

## 2016-03-28 NOTE — Assessment & Plan Note (Addendum)
Chronic problem, does not seem well controlled currently, worse recently with variable medical conditions with chronic pain, fibro, recurrent falls now. - improved with PRN xanax, but not ideal chronic control, has been on benzo for years, also prior on clonazepam since discontinued - Concern with prior history of Bipolar and OCD per chart review, fam history of bipolar  Plan: 1. Advised patient that I would not recommend continuing Xanax for anxiety, ideally she would need different long term anti-anxiety or mood stabilizing medication. Given her complex history, advised her that she needs to establish with Psychiatry, currently receives therapy from from [facility], and given resources today of various walk in clinics to establish - Counseled on tapering her remaining xanax, red flags if withdrawal may seek treatment in emergency department, no prior acute BDZ withdrawal in past, reportedly can skip several days without symptoms

## 2016-03-28 NOTE — Assessment & Plan Note (Signed)
Chronic history of recurrent flares of bilateral joint pains, mostly wrists/hands, also knees, hips and large joints. Prior dx lupus, variety of treatments. Now followed by Rheumatology at [facility], lab work-up unremarkable for lupus or connective tissue disease. - Continue current pain management for Fibromyalgia

## 2016-03-28 NOTE — Assessment & Plan Note (Addendum)
Worsening, chronic problem. Significant lower extremity bilateral proximal muscle weakness Remains in wheelchair Recent Lumbar MRI with variable findings of DJD and disc bulges To establish with Neu[facility] 10/30, follow-up

## 2016-03-28 NOTE — Assessment & Plan Note (Signed)
See chronic pain Refilled Lyrica today, lowered dose to 150mg  TID, from prior 200 TID Refilled Tizanidine Refilled Ramelton for sleep Defer tramadol refill today, established care, follow-up in future will do UDS and consider pain contract vs referral to pain management

## 2016-03-28 NOTE — Assessment & Plan Note (Signed)
Stable, chronic. Fibromyalgia, possible connective tissue disease - Continue current pain regimen, with Lyrica, muscle relaxants

## 2016-03-28 NOTE — Assessment & Plan Note (Signed)
Stable, followed by Nephrology ?

## 2016-03-28 NOTE — Assessment & Plan Note (Signed)
Controlled on Atorvastatin 40 mg daily.

## 2016-03-28 NOTE — Assessment & Plan Note (Signed)
Currently stable on regimen with Lyrica high dose 200mg  TID, Tizanidine, Tramadol only 50mg  nightly. Secondary to fibromyalgia, and possible connective tissue disorder, unclear about prior history of lupus given recent rheum work-up appears unremarkable. - Reviewed [place] CSRS today on 03/28/16 record from past 1 year, all rx appear appropriate monthly intervals from prior PCP Dr Ancil Boozer. No red flags  - Discussed controlled substance rx for chronic pain, given she is establishing care today, do not plan to refill her Tramadol at this time. She is to finish further evaluation with Rheumatology, Neurology, and follow-up in future - Offered UDS screening today, she admits to recent one time THC use, and agreement to hold off on this now, and if we are pursuing chronic pain management here then we will obtain UDS first, discuss contract, and determine if temporary Tramadol coverage vs referral to alternative chronic pain clinic. - I advised her that I do not recommend any stronger opiate pain medications or higher dose tramadol given significant concerns right now with recurrent falls, sedating medications

## 2016-03-28 NOTE — Assessment & Plan Note (Signed)
Chronic problem, multifactorial with chronic pain syndrome (fibro, possible connective tissue disease), and anxiety / mood disorder - Refilled [place] at this time, cautious about future refills on ambien, concern with polypharmacy and oversedation with frequent falls

## 2016-03-28 NOTE — Assessment & Plan Note (Signed)
Chronic bilateral problem. Using wrist supports regularly. Taking Lyrica Follow-up ortho in future if consider surgical release

## 2016-03-29 DIAGNOSIS — G894 Chronic pain syndrome: Secondary | ICD-10-CM | POA: Diagnosis not present

## 2016-03-29 DIAGNOSIS — M797 Fibromyalgia: Secondary | ICD-10-CM | POA: Diagnosis not present

## 2016-03-29 DIAGNOSIS — M5030 Other cervical disc degeneration, unspecified cervical region: Secondary | ICD-10-CM | POA: Insufficient documentation

## 2016-03-29 DIAGNOSIS — R269 Unspecified abnormalities of gait and mobility: Secondary | ICD-10-CM | POA: Diagnosis not present

## 2016-03-29 DIAGNOSIS — M5136 Other intervertebral disc degeneration, lumbar region: Secondary | ICD-10-CM | POA: Diagnosis not present

## 2016-04-11 DIAGNOSIS — R413 Other amnesia: Secondary | ICD-10-CM | POA: Insufficient documentation

## 2016-04-17 DIAGNOSIS — F5105 Insomnia due to other mental disorder: Secondary | ICD-10-CM

## 2016-04-17 DIAGNOSIS — F418 Other specified anxiety disorders: Secondary | ICD-10-CM | POA: Insufficient documentation

## 2016-04-23 ENCOUNTER — Ambulatory Visit: Payer: Self-pay | Admitting: Family Medicine

## 2016-05-09 DIAGNOSIS — Z720 Tobacco use: Secondary | ICD-10-CM | POA: Insufficient documentation

## 2016-06-06 ENCOUNTER — Emergency Department
Admission: EM | Admit: 2016-06-06 | Discharge: 2016-06-06 | Disposition: A | Discharge status: Home / Self Care | Payer: Medicare Other | Attending: Emergency Medicine | Admitting: Emergency Medicine

## 2016-06-06 ENCOUNTER — Encounter: Payer: Self-pay | Admitting: *Deleted

## 2016-06-06 ENCOUNTER — Emergency Department: Payer: Medicare Other

## 2016-06-06 DIAGNOSIS — F17210 Nicotine dependence, cigarettes, uncomplicated: Secondary | ICD-10-CM | POA: Insufficient documentation

## 2016-06-06 DIAGNOSIS — Y999 Unspecified external cause status: Secondary | ICD-10-CM | POA: Insufficient documentation

## 2016-06-06 DIAGNOSIS — Z79899 Other long term (current) drug therapy: Secondary | ICD-10-CM | POA: Diagnosis not present

## 2016-06-06 DIAGNOSIS — Z7982 Long term (current) use of aspirin: Secondary | ICD-10-CM | POA: Diagnosis not present

## 2016-06-06 DIAGNOSIS — I129 Hypertensive chronic kidney disease with stage 1 through stage 4 chronic kidney disease, or unspecified chronic kidney disease: Secondary | ICD-10-CM | POA: Insufficient documentation

## 2016-06-06 DIAGNOSIS — S2221XA Fracture of manubrium, initial encounter for closed fracture: Secondary | ICD-10-CM

## 2016-06-06 DIAGNOSIS — Y92410 Unspecified street and highway as the place of occurrence of the external cause: Secondary | ICD-10-CM | POA: Diagnosis not present

## 2016-06-06 DIAGNOSIS — I2510 Atherosclerotic heart disease of native coronary artery without angina pectoris: Secondary | ICD-10-CM | POA: Insufficient documentation

## 2016-06-06 DIAGNOSIS — S299XXA Unspecified injury of thorax, initial encounter: Secondary | ICD-10-CM | POA: Diagnosis present

## 2016-06-06 DIAGNOSIS — N183 Chronic kidney disease, stage 3 (moderate): Secondary | ICD-10-CM | POA: Diagnosis not present

## 2016-06-06 DIAGNOSIS — Y9389 Activity, other specified: Secondary | ICD-10-CM | POA: Insufficient documentation

## 2016-06-06 HISTORY — DX: Disorder of kidney and ureter, unspecified: N28.9

## 2016-06-06 HISTORY — DX: Systemic involvement of connective tissue, unspecified: M35.9

## 2016-06-06 LAB — CBC
HCT: 37.3 % (ref 35.0–47.0)
HEMOGLOBIN: 13 g/dL (ref 12.0–16.0)
MCH: 31.1 pg (ref 26.0–34.0)
MCHC: 34.8 g/dL (ref 32.0–36.0)
MCV: 89.5 fL (ref 80.0–100.0)
Platelets: 247 10*3/uL (ref 150–440)
RBC: 4.17 MIL/uL (ref 3.80–5.20)
RDW: 13.3 % (ref 11.5–14.5)
WBC: 7.3 10*3/uL (ref 3.6–11.0)

## 2016-06-06 LAB — COMPREHENSIVE METABOLIC PANEL
ALK PHOS: 86 U/L (ref 38–126)
ALT: 24 U/L (ref 14–54)
AST: 22 U/L (ref 15–41)
Albumin: 3.9 g/dL (ref 3.5–5.0)
Anion gap: 7 (ref 5–15)
BUN: 21 mg/dL — ABNORMAL HIGH (ref 6–20)
CALCIUM: 8.9 mg/dL (ref 8.9–10.3)
CO2: 25 mmol/L (ref 22–32)
Chloride: 108 mmol/L (ref 101–111)
Creatinine, Ser: 1.19 mg/dL — ABNORMAL HIGH (ref 0.44–1.00)
GFR, EST AFRICAN AMERICAN: 59 mL/min — AB (ref >60)
GFR, EST NON AFRICAN AMERICAN: 51 mL/min — AB (ref >60)
Glucose, Bld: 98 mg/dL (ref 65–99)
Potassium: 3.8 mmol/L (ref 3.5–5.1)
SODIUM: 140 mmol/L (ref 135–145)
Total Bilirubin: 0.7 mg/dL (ref 0.3–1.2)
Total Protein: 7.2 g/dL (ref 6.5–8.1)

## 2016-06-06 MED ORDER — TIZANIDINE HCL 4 MG PO TABS
4.0000 mg | ORAL_TABLET | Freq: Once | ORAL | Status: DC
Start: 2016-06-06 — End: 2016-06-06
  Filled 2016-06-06 (×2): qty 1

## 2016-06-06 MED ORDER — ONDANSETRON HCL 4 MG/2ML IJ SOLN
4.0000 mg | Freq: Once | INTRAMUSCULAR | Status: AC
  Administered 2016-06-06: 4 mg via INTRAVENOUS

## 2016-06-06 MED ORDER — OXYCODONE-ACETAMINOPHEN 5-325 MG PO TABS: 1.0000 | ORAL_TABLET | ORAL | 0 refills | Status: DC | PRN

## 2016-06-06 MED ORDER — IOPAMIDOL (ISOVUE-300) INJECTION 61%
75.0000 mL | Freq: Once | INTRAVENOUS | Status: AC | PRN
  Administered 2016-06-06: 75 mL via INTRAVENOUS

## 2016-06-06 MED ORDER — KETOROLAC TROMETHAMINE 30 MG/ML IJ SOLN
30.0000 mg | Freq: Once | INTRAMUSCULAR | Status: AC
  Administered 2016-06-06: 30 mg via INTRAVENOUS
  Filled 2016-06-06: qty 1

## 2016-06-06 MED ORDER — OXYCODONE-ACETAMINOPHEN 5-325 MG PO TABS
1.0000 | ORAL_TABLET | Freq: Once | ORAL | Status: AC
  Administered 2016-06-06: 1 via ORAL
  Filled 2016-06-06: qty 1

## 2016-06-06 MED ORDER — ONDANSETRON HCL 4 MG/2ML IJ SOLN
INTRAMUSCULAR | Status: AC
  Administered 2016-06-06: 4 mg via INTRAVENOUS
  Filled 2016-06-06: qty 2
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
## 2016-06-06 NOTE — ED Notes (Signed)
Patient returned from CT

## 2016-06-11 ENCOUNTER — Encounter: Payer: Self-pay | Admitting: Emergency Medicine

## 2016-06-11 ENCOUNTER — Emergency Department: Payer: Medicare Other

## 2016-06-11 ENCOUNTER — Emergency Department
Admission: EM | Admit: 2016-06-11 | Discharge: 2016-06-13 | Disposition: A | Discharge status: Home / Self Care | Payer: Medicare Other | Attending: Emergency Medicine | Admitting: Emergency Medicine

## 2016-06-11 DIAGNOSIS — G4700 Insomnia, unspecified: Secondary | ICD-10-CM

## 2016-06-11 DIAGNOSIS — Z79899 Other long term (current) drug therapy: Secondary | ICD-10-CM | POA: Insufficient documentation

## 2016-06-11 DIAGNOSIS — S2220XD Unspecified fracture of sternum, subsequent encounter for fracture with routine healing: Secondary | ICD-10-CM | POA: Diagnosis not present

## 2016-06-11 DIAGNOSIS — G894 Chronic pain syndrome: Secondary | ICD-10-CM

## 2016-06-11 DIAGNOSIS — F17210 Nicotine dependence, cigarettes, uncomplicated: Secondary | ICD-10-CM | POA: Insufficient documentation

## 2016-06-11 DIAGNOSIS — I2510 Atherosclerotic heart disease of native coronary artery without angina pectoris: Secondary | ICD-10-CM | POA: Diagnosis not present

## 2016-06-11 DIAGNOSIS — I129 Hypertensive chronic kidney disease with stage 1 through stage 4 chronic kidney disease, or unspecified chronic kidney disease: Secondary | ICD-10-CM | POA: Insufficient documentation

## 2016-06-11 DIAGNOSIS — S2221XD Fracture of manubrium, subsequent encounter for fracture with routine healing: Secondary | ICD-10-CM

## 2016-06-11 DIAGNOSIS — M359 Systemic involvement of connective tissue, unspecified: Secondary | ICD-10-CM

## 2016-06-11 DIAGNOSIS — N183 Chronic kidney disease, stage 3 (moderate): Secondary | ICD-10-CM | POA: Diagnosis not present

## 2016-06-11 DIAGNOSIS — M6281 Muscle weakness (generalized): Secondary | ICD-10-CM

## 2016-06-11 DIAGNOSIS — R2681 Unsteadiness on feet: Secondary | ICD-10-CM

## 2016-06-11 HISTORY — DX: Other encephalopathy: G93.49

## 2016-06-11 MED ORDER — HYDROCHLOROTHIAZIDE 12.5 MG PO CAPS
12.5000 mg | ORAL_CAPSULE | Freq: Every day | ORAL | Status: DC
  Administered 2016-06-11 – 2016-06-13 (×3): 12.5 mg via ORAL
  Filled 2016-06-11 (×3): qty 1

## 2016-06-11 MED ORDER — HYDROMORPHONE HCL 1 MG/ML IJ SOLN
1.0000 mg | Freq: Once | INTRAMUSCULAR | Status: AC
  Administered 2016-06-11: 1 mg via INTRAMUSCULAR
  Filled 2016-06-11: qty 1

## 2016-06-11 MED ORDER — ATORVASTATIN CALCIUM 20 MG PO TABS
40.0000 mg | ORAL_TABLET | Freq: Every day | ORAL | Status: DC
  Administered 2016-06-12: 40 mg via ORAL
  Filled 2016-06-11: qty 1
  Filled 2016-06-11: qty 2

## 2016-06-11 MED ORDER — ONDANSETRON 4 MG PO TBDP
4.0000 mg | ORAL_TABLET | Freq: Three times a day (TID) | ORAL | Status: DC | PRN
Start: 2016-06-11 — End: 2016-06-13
  Administered 2016-06-11: 4 mg via ORAL
  Filled 2016-06-11: qty 1

## 2016-06-11 MED ORDER — PREGABALIN 75 MG PO CAPS
200.0000 mg | ORAL_CAPSULE | Freq: Three times a day (TID) | ORAL | Status: DC
  Administered 2016-06-12 – 2016-06-13 (×4): 200 mg via ORAL
  Filled 2016-06-11 (×3): qty 1
  Filled 2016-06-11: qty 2
  Filled 2016-06-11: qty 1

## 2016-06-11 MED ORDER — LOSARTAN POTASSIUM 50 MG PO TABS
100.0000 mg | ORAL_TABLET | Freq: Every day | ORAL | Status: DC
  Administered 2016-06-11 – 2016-06-13 (×3): 100 mg via ORAL
  Filled 2016-06-11 (×3): qty 2

## 2016-06-11 MED ORDER — RAMELTEON 8 MG PO TABS
8.0000 mg | ORAL_TABLET | Freq: Every day | ORAL | Status: DC
  Administered 2016-06-12 (×2): 8 mg via ORAL
  Filled 2016-06-11 (×4): qty 1

## 2016-06-11 MED ORDER — IBUPROFEN 600 MG PO TABS
600.0000 mg | ORAL_TABLET | Freq: Four times a day (QID) | ORAL | Status: DC | PRN
  Administered 2016-06-12 – 2016-06-13 (×2): 600 mg via ORAL
  Filled 2016-06-11 (×2): qty 1

## 2016-06-11 MED ORDER — OXYCODONE-ACETAMINOPHEN 5-325 MG PO TABS
1.0000 | ORAL_TABLET | Freq: Four times a day (QID) | ORAL | Status: DC | PRN
  Administered 2016-06-11: 1 via ORAL
  Administered 2016-06-12: 2 via ORAL
  Administered 2016-06-12 (×3): 1 via ORAL
  Administered 2016-06-13 (×2): 2 via ORAL
  Filled 2016-06-11 (×4): qty 1
  Filled 2016-06-11 (×4): qty 2

## 2016-06-11 MED ORDER — ASPIRIN EC 81 MG PO TBEC
81.0000 mg | DELAYED_RELEASE_TABLET | Freq: Every day | ORAL | Status: DC
  Administered 2016-06-11 – 2016-06-12 (×2): 81 mg via ORAL
  Filled 2016-06-11 (×2): qty 1

## 2016-06-11 MED ORDER — LOSARTAN POTASSIUM-HCTZ 100-12.5 MG PO TABS: 1.0000 | ORAL_TABLET | Freq: Every day | ORAL | Status: DC

## 2016-06-11 MED ORDER — NICOTINE 21 MG/24HR TD PT24
21.0000 mg | MEDICATED_PATCH | Freq: Once | TRANSDERMAL | Status: AC
  Administered 2016-06-11: 21 mg via TRANSDERMAL
  Filled 2016-06-11: qty 1

## 2016-06-11 MED ORDER — PREGABALIN 100 MG PO CAPS: 100.0000 mg | ORAL_CAPSULE | Freq: Three times a day (TID) | ORAL | Status: DC

## 2016-06-11 MED ORDER — TIZANIDINE HCL 2 MG PO TABS
2.0000 mg | ORAL_TABLET | Freq: Every day | ORAL | Status: DC
  Administered 2016-06-11 – 2016-06-12 (×2): 2 mg via ORAL
  Filled 2016-06-11 (×4): qty 1

## 2016-06-11 MED ORDER — FAMOTIDINE 20 MG PO TABS
20.0000 mg | ORAL_TABLET | Freq: Every day | ORAL | Status: DC
  Administered 2016-06-11 – 2016-06-13 (×2): 20 mg via ORAL
  Filled 2016-06-11 (×3): qty 1

## 2016-06-11 NOTE — ED Notes (Signed)
This RN into room to medicate patient and patient refused this RN to administer medication. Pt states "You are not going to administer that medication, I don't feel safe with you administering that to me without hurting me". Explained to patient that I was her nurse and would be the one to administer the medication. Pt refused. Charge RN notified and [place] notified. [place] RN into administer medication.

## 2016-06-11 NOTE — ED Notes (Signed)
Patient states she fell asleep in waiting room "best sleep I've had".  Yellow fall armband placed on patient.

## 2016-06-11 NOTE — ED Notes (Signed)
Pt given ginger ale and crackers and pt also stated that she wanted another one of them shots because it made her sleep real good.
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
## 2016-06-11 NOTE — ED Notes (Signed)
Pt resting quietly in room, even closed, even and nonlabored respirations noted. Will continue to monitor.
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
## 2016-06-11 NOTE — ED Notes (Signed)
Pt given ice chips
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
## 2016-06-11 NOTE — ED Notes (Signed)
Pt refusing vitals at this time.

## 2016-06-11 NOTE — ED Notes (Signed)
Pt in lobby sleeping.
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
## 2016-06-11 NOTE — ED Notes (Signed)
Pt resting quietly in lobby with eyes closed, even and nonlabored respirations noted. Pt called to triage, no answer, pt sleeping.

## 2016-06-12 ENCOUNTER — Encounter: Payer: Self-pay | Admitting: Emergency Medicine

## 2016-06-12 DIAGNOSIS — S2220XD Unspecified fracture of sternum, subsequent encounter for fracture with routine healing: Secondary | ICD-10-CM | POA: Diagnosis not present

## 2016-06-12 NOTE — ED Notes (Signed)
Pt informed CDU department is closing ans she will be moved to main ED, informed pt she will be placed in a hallway bed. Pt became agitated and cursing at this nurse for moving pt to hallway. Pt states "why can't I stay in this room" Pt was in room 36. This RN informed pt the department was closing and there will not be any staff members here, therefore she must be moved. Pt yelling and cussing at this staff member again, this RN apologized to patient for placing her in hallway and informed patient there were no rooms available and that is why she was being moved to a hallway bed. Pt continues to be agitated, requesting pain medication and medication to help her sleep (Rozerem). Pt placed in 1H, warm blanket provided, mask provided per patient request to place over mouth and eyes.

## 2016-06-12 NOTE — ED Notes (Signed)
Assisted pt up to bathroom with walker and back into bed.

## 2016-06-12 NOTE — ED Notes (Signed)
Pt requesting pain meds.  Motrin given motrin.  Pt had meds at 1330.  Pt alert and watching tv.

## 2016-06-12 NOTE — ED Notes (Signed)
[facility] case manager called to report [facility] will not take pt tomorrow.  Case manager will try for another place tomorrow  Dr Burlene Arnt aware.
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
## 2016-06-12 NOTE — ED Notes (Signed)
PASRR number for pt has been received. PASRR # is [id number].  Samantha Macias, MSW, [place]

## 2016-06-12 NOTE — ED Notes (Signed)
CSW states they do not [facility] at [facility] but will tomorrow, states she will speak with Dr. Joni Fears about this plan.Marland Kitchen

## 2016-06-12 NOTE — ED Notes (Signed)
Pt reports "severe pain," and requested medication. This nurse will check MAR to see what pt can have. Pt alert at this time.
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
## 2016-06-12 NOTE — ED Notes (Signed)
Report off to kuch rn  

## 2016-06-12 NOTE — NC FL2 (Signed)
[place] LEVEL OF CARE SCREENING TOOL     IDENTIFICATION  Patient Name: Samantha Macias Birthdate: Dec 23, 1960 Sex: female Admission Date (Current Location): 06/11/2016  [facility] and [place] Number:  Engineering geologist and Address:  [facility], [address] [place], [place] [address]      Provider Number: [id number]  Attending Physician Name and Address:  No att. providers found  Relative Name and Phone Number:       Current Level of Care: Hospital Recommended Level of Care: [place] Prior Approval Number:    Date Approved/Denied:   PASRR Number:    Discharge Plan: SNF    Current Diagnoses: Patient Active Problem List   Diagnosis Date Noted  . Vitamin D deficiency 03/28/2016  . HLD (hyperlipidemia) 03/28/2016  . Anxiety 03/28/2016  . 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 Arthralgia of multiple joints 02/17/2016  . Thyromegaly 08/18/2015  . Thyroid cyst 08/18/2015  . Osteoarthritis of both hands 08/01/2015  . Hypertension, benign 07/01/2015  . Hyperparathyroidism, secondary renal ([place]) 12/03/2014  . Allergic rhinitis 12/01/2014  . Anemia in chronic illness 12/01/2014  . GAD (generalized anxiety disorder) 12/01/2014  . Back pain, chronic 12/01/2014  . Bipolar affective disorder, currently depressed, mild ([place]) 12/01/2014  . Atherosclerosis of left carotid artery 12/01/2014  . Carpal tunnel syndrome 12/01/2014  . Chronic kidney disease (CKD), stage III (moderate) 12/01/2014  . Insomnia, persistent 12/01/2014  . Chronic pain syndrome 12/01/2014  . CN (constipation) 12/01/2014  . Kidney cysts 12/01/2014  . Cervical osteoarthritis 12/01/2014  . Dyslipidemia 12/01/2014  . Fibromyalgia syndrome 12/01/2014  . Abnormal gait 12/01/2014  . Gastro-esophageal reflux disease without esophagitis 12/01/2014  . History of colon polyps 12/01/2014  . History of syncope 12/01/2014  . H/O transient cerebral  ischemia 12/01/2014  . HPV test positive 12/01/2014  . Recurrent falls 12/01/2014  . Calculus of kidney 12/01/2014  . Lumbar radiculopathy 12/01/2014  . OCD (obsessive compulsive disorder) 12/01/2014  . Overweight 12/01/2014  . PTSD (post-traumatic stress disorder) 12/01/2014  . Trochanteric bursitis of both hips 12/01/2014  . 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    Orientation RESPIRATION BLADDER Height & Weight     Self, Situation, Place  Normal Incontinent Weight: 180 lb (81.6 kg) Height:  5\' 8"  (172.7 cm)  BEHAVIORAL SYMPTOMS/MOOD NEUROLOGICAL BOWEL NUTRITION STATUS     (None) Continent Diet (Thin fluid consistency)  AMBULATORY STATUS COMMUNICATION OF NEEDS Skin   Extensive Assist Verbally Normal                       Personal Care Assistance Level of Assistance  Bathing, Feeding, Dressing Bathing Assistance: Limited assistance Feeding assistance: Independent Dressing Assistance: Limited assistance     Functional Limitations Info  Sight, Hearing, Speech Sight Info: Impaired (Wears glasses) Hearing Info: Impaired (Wears hearing aids) Speech Info: Adequate    SPECIAL CARE FACTORS FREQUENCY  PT (By licensed PT), OT (By licensed OT)     PT Frequency: 5x OT Frequency: 5x            Contractures Contractures Info: Not present    Additional Factors Info  Code Status, Allergies Code Status Info: Not on file  Allergies Info:  Shellfish Allergy, Ace Inhibitors, Codeine, Nitrofurantoin Monohyd Macro, Other, Red Dye, Tetanus Toxoid Adsorbed           Current Medications (06/12/2016):  This is the current hospital active medication list Current Facility-Administered Medications  Medication Dose Route  Frequency Provider Last Rate Last Dose  . aspirin EC tablet 81 mg  81 mg Oral QHS Harvest Dark, MD   81 mg at 06/11/16 2225  . atorvastatin (LIPITOR) tablet 40 mg  40 mg Oral q1800 Harvest Dark, MD      . famotidine (PEPCID) tablet 20 mg  20 mg Oral  Daily Harvest Dark, MD   20 mg at 06/11/16 2225  . losartan (COZAAR) tablet 100 mg  100 mg Oral Daily Lenis Noon, RPH   100 mg at 06/12/16 1027   And  . hydrochlorothiazide (MICROZIDE) capsule 12.5 mg  12.5 mg Oral Daily Lenis Noon, RPH   12.5 mg at 06/12/16 1027  . ibuprofen (ADVIL,MOTRIN) tablet 600 mg  600 mg Oral Q6H PRN Harvest Dark, MD      . nicotine (NICODERM CQ - dosed in mg/24 hours) patch 21 mg  21 mg Transdermal Once Harvest Dark, MD   21 mg at 06/11/16 2027  . ondansetron (ZOFRAN-ODT) disintegrating tablet 4 mg  4 mg Oral Q8H PRN Harvest Dark, MD   4 mg at 06/11/16 1923  . oxyCODONE-acetaminophen (PERCOCET/ROXICET) 5-325 MG per tablet 1-2 tablet  1-2 tablet Oral Q6H PRN Harvest Dark, MD   1 tablet at 06/12/16 0556  . pregabalin (LYRICA) capsule 200 mg  200 mg Oral TID Harvest Dark, MD   200 mg at 06/12/16 1027  . ramelteon (ROZEREM) tablet 8 mg  8 mg Oral QHS Hinda Kehr, MD   8 mg at 06/12/16 0007  . tiZANidine (ZANAFLEX) tablet 2 mg  2 mg Oral QHS Harvest Dark, MD   2 mg at 06/11/16 2225   Current Outpatient Prescriptions  Medication Sig Dispense Refill  . ALPRAZolam (XANAX) 0.5 MG tablet Take 0.5-0.75 mg by mouth at bedtime as needed for anxiety.    Marland Kitchen aspirin EC 81 MG tablet Take 1 tablet (81 mg total) by mouth at bedtime.    . Cholecalciferol (VITAMIN D) 2000 units tablet Take 2,000 Units by mouth daily.    . diclofenac sodium (VOLTAREN) 1 % GEL Apply 4 g topically 4 (four) times daily as needed. (Patient taking differently: Apply 4 g topically 4 (four) times daily as needed. For pain) 100 g 5  . hydrALAZINE (APRESOLINE) 25 MG tablet Take 25 mg by mouth 3 (three) times daily as needed. For high blood pressure    . ibuprofen (ADVIL,MOTRIN) 600 MG tablet Take 1 tablet (600 mg total) by mouth every 6 (six) hours as needed. (Patient taking differently: Take 600 mg by mouth every 6 (six) hours as needed for moderate pain. ) 30 tablet 0  .  losartan-hydrochlorothiazide (HYZAAR) 100-12.5 MG tablet Take 1 tablet by mouth daily. 90 tablet 1  . oxyCODONE-acetaminophen (ROXICET) 5-325 MG tablet Take 1 tablet by mouth every 4 (four) hours as needed for severe pain. 20 tablet 0  . pregabalin (LYRICA) 200 MG capsule Take 200 mg by mouth 3 (three) times daily.    . ramelteon (ROZEREM) 8 MG tablet Take 1 tablet (8 mg total) by mouth at bedtime. 90 tablet 1  . tetrahydrozoline-zinc (VISINE-AC) 0.05-0.25 % ophthalmic solution Place 2 drops into both eyes every morning.    Marland Kitchen tiZANidine (ZANAFLEX) 2 MG tablet Take 1 tablet (2 mg total) by mouth at bedtime. 30 tablet 5  . traMADol (ULTRAM) 50 MG tablet Take 1 tablet (50 mg total) by mouth every 8 (eight) hours as needed. (Patient taking differently: Take 50 mg by mouth 2 (two) times daily  as needed for moderate pain. ) 90 tablet 0     Discharge Medications: Please see discharge summary for a list of discharge medications.  Relevant Imaging Results:  Relevant Lab Results:   Additional Information SSN: [id number]  Georga Kaufmann, LCSWA

## 2016-06-12 NOTE — ED Notes (Signed)
Pt assisted to the toilet without difficulty.Marland Kitchen
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
## 2016-06-12 NOTE — ED Notes (Signed)
Assisted pt to BR. Pt was wheeled in stretcher to BR, from there assistance and walker were used to get in the BR. Pt able to ambulate well with assistance. Pt reports "I am having a panic attack, I need xanax and lyrica" when getting to the toilet. However pt continued to talk throughout using the BR and displayed no symptoms of SOB. Pt used BR, washed hands and was assisted back to bed without complication. Pt wheeled back in stretcher to [place]. Pt requested her legs to be elevated and back of bed lowered which was done, pt states she is comfortable.

## 2016-06-12 NOTE — ED Notes (Signed)
Pt sleeping comfortably in stretcher. Respirations even and unlabored. Will continue to monitor.

## 2016-06-12 NOTE — ED Notes (Signed)
Resumed care from stephanie rn.  Pt alert and eating snacks.  Pt states she is waiting on transport to a nursing home.

## 2016-06-13 DIAGNOSIS — S2220XD Unspecified fracture of sternum, subsequent encounter for fracture with routine healing: Secondary | ICD-10-CM | POA: Diagnosis not present

## 2016-06-13 MED ORDER — OXYCODONE-ACETAMINOPHEN 5-325 MG PO TABS
1.0000 | ORAL_TABLET | ORAL | 0 refills | Status: DC | PRN
Start: 2016-06-13 — End: 2017-07-17

## 2016-06-13 MED ORDER — NICOTINE 21 MG/24HR TD PT24
MEDICATED_PATCH | TRANSDERMAL | Status: AC
  Administered 2016-06-13: 21 mg via TRANSDERMAL
  Filled 2016-06-13: qty 1

## 2016-06-13 MED ORDER — NICOTINE 21 MG/24HR TD PT24
21.0000 mg | MEDICATED_PATCH | Freq: Every day | TRANSDERMAL | Status: DC
  Administered 2016-06-13: 21 mg via TRANSDERMAL

## 2016-06-13 MED ORDER — PREGABALIN 200 MG PO CAPS: 200.0000 mg | ORAL_CAPSULE | Freq: Three times a day (TID) | ORAL | 0 refills | Status: DC

## 2016-06-13 MED ORDER — TIZANIDINE HCL 2 MG PO TABS: 2.0000 mg | ORAL_TABLET | Freq: Four times a day (QID) | ORAL | 5 refills | Status: DC | PRN
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
## 2016-06-13 NOTE — ED Notes (Signed)
Pt resting in bed, pt talking on cell phone, pt in no acute distress

## 2016-06-13 NOTE — Care Management Note (Signed)
Case Management Note  Patient Details  Name: Sydnei Lamonda MRN: [id number] Date of Birth: January 28, 1961  Subjective/Objective:   Lelon Mast at [facility] and they have accepted the referral, and will see the patient today. [phone]=office number given to the patient at the time of discharge. RN and MD for the patient made aware. MD to complete the face to face for PT, OT and an aide.                Action/Plan:   Expected Discharge Date:                  Expected Discharge Plan:     In-House Referral:     Discharge planning Services     Post Acute Care Choice:    Choice offered to:     DME Arranged:    DME Agency:     HH Arranged:    HH Agency:     Status of Service:     If discussed at [employer] of Stay Meetings, dates discussed:    Additional Comments:  Beau Fanny, RN 06/13/2016, 11:51 AM

## 2016-06-13 NOTE — ED Provider Notes (Addendum)
  Physical Exam  BP 113/84 (BP Location: Right Arm)   Pulse 83   Temp 98.3 F (36.8 C) (Oral)   Resp 16   Ht 5\' 8"  (1.727 m)   Wt 180 lb (81.6 kg)   SpO2 94%   BMI 27.37 kg/m   Physical Exam  ED Course  Procedures  MDM Patient pending placement for sternal fracture. Social work saw patient and will work on placement today   12:01 PM Social work and case management saw patient. Pain under control now. Wants to go home instead of being placed. She is willing to get home health services, which I ordered. Will prescribe pain meds, xanaflex which she has been receiving in the ED. Home health to see her at her home later today. Someone to pick her up from the ED.       Drenda Freeze, MD 06/13/16 [id number]    Drenda Freeze, MD 06/13/16 [phone]

## 2016-06-13 NOTE — ED Notes (Signed)
Pt awake, pt given coffee and asking for breakfast, pt told breakfast trays had not arrived yet, readjusted in bed

## 2016-06-13 NOTE — Discharge Instructions (Signed)
Take motrin for pain,   Take percocet for severe pain. Do NOT drive with it   Take xanaflex for muscle spasms.   See your doctor. Home health will meet you at your home today.   Return to ER if you have worse muscle spasms, severe pain, trouble breathing, fever.

## 2016-06-13 NOTE — ED Notes (Signed)
Pt resting in bed, resp even and unlabored, eyes closed 

## 2016-09-19 ENCOUNTER — Other Ambulatory Visit: Payer: Self-pay | Admitting: Family Medicine

## 2016-09-19 DIAGNOSIS — G4700 Insomnia, unspecified: Secondary | ICD-10-CM

## 2016-09-19 DIAGNOSIS — R0789 Other chest pain: Secondary | ICD-10-CM | POA: Insufficient documentation

## 2016-10-09 DIAGNOSIS — J449 Chronic obstructive pulmonary disease, unspecified: Secondary | ICD-10-CM | POA: Insufficient documentation

## 2016-10-10 ENCOUNTER — Other Ambulatory Visit: Payer: Self-pay | Admitting: Internal Medicine

## 2016-10-10 DIAGNOSIS — F17210 Nicotine dependence, cigarettes, uncomplicated: Secondary | ICD-10-CM

## 2016-10-10 DIAGNOSIS — J449 Chronic obstructive pulmonary disease, unspecified: Secondary | ICD-10-CM

## 2016-10-18 ENCOUNTER — Telehealth: Payer: Self-pay | Admitting: *Deleted

## 2016-10-18 NOTE — Telephone Encounter (Signed)
Received notification from outpatient imaging center of patient for lung cancer screening. Contacted patient to arrange for shared decision making visit and lung screening CT scan. Upon record review noted patient is 56 years old and will not be eligible for scan until November. Message sent to PCP and patient notified. Patient is upset that she cannot have a "screening" scan. Patient and PCP made aware that a noncontrast Ct scan can be ordered but not a screening scan.

## 2016-10-19 ENCOUNTER — Ambulatory Visit: Admission: RE | Admit: 2016-10-19 | Payer: Medicare Other | Source: Ambulatory Visit

## 2016-11-08 ENCOUNTER — Emergency Department: Payer: Medicare Other

## 2016-11-08 ENCOUNTER — Emergency Department
Admission: EM | Admit: 2016-11-08 | Discharge: 2016-11-09 | Disposition: A | Discharge status: Home / Self Care | Payer: Medicare Other | Attending: Emergency Medicine | Admitting: Emergency Medicine

## 2016-11-08 ENCOUNTER — Encounter: Payer: Self-pay | Admitting: Emergency Medicine

## 2016-11-08 DIAGNOSIS — Z791 Long term (current) use of non-steroidal anti-inflammatories (NSAID): Secondary | ICD-10-CM | POA: Insufficient documentation

## 2016-11-08 DIAGNOSIS — Z7982 Long term (current) use of aspirin: Secondary | ICD-10-CM | POA: Insufficient documentation

## 2016-11-08 DIAGNOSIS — Z8673 Personal history of transient ischemic attack (TIA), and cerebral infarction without residual deficits: Secondary | ICD-10-CM | POA: Insufficient documentation

## 2016-11-08 DIAGNOSIS — Z79899 Other long term (current) drug therapy: Secondary | ICD-10-CM | POA: Insufficient documentation

## 2016-11-08 DIAGNOSIS — N183 Chronic kidney disease, stage 3 (moderate): Secondary | ICD-10-CM | POA: Insufficient documentation

## 2016-11-08 DIAGNOSIS — F17210 Nicotine dependence, cigarettes, uncomplicated: Secondary | ICD-10-CM | POA: Insufficient documentation

## 2016-11-08 DIAGNOSIS — R0789 Other chest pain: Secondary | ICD-10-CM | POA: Diagnosis not present

## 2016-11-08 DIAGNOSIS — I129 Hypertensive chronic kidney disease with stage 1 through stage 4 chronic kidney disease, or unspecified chronic kidney disease: Secondary | ICD-10-CM | POA: Diagnosis not present

## 2016-11-08 DIAGNOSIS — R079 Chest pain, unspecified: Secondary | ICD-10-CM | POA: Diagnosis present

## 2016-11-08 DIAGNOSIS — I2510 Atherosclerotic heart disease of native coronary artery without angina pectoris: Secondary | ICD-10-CM | POA: Insufficient documentation

## 2016-11-08 DIAGNOSIS — R0602 Shortness of breath: Secondary | ICD-10-CM | POA: Diagnosis not present

## 2016-11-08 LAB — BASIC METABOLIC PANEL
Anion gap: 11 (ref 5–15)
BUN: 29 mg/dL — ABNORMAL HIGH (ref 6–20)
CALCIUM: 8.9 mg/dL (ref 8.9–10.3)
CO2: 24 mmol/L (ref 22–32)
CREATININE: 1.33 mg/dL — AB (ref 0.44–1.00)
Chloride: 107 mmol/L (ref 101–111)
GFR calc non Af Amer: 44 mL/min — ABNORMAL LOW (ref >60)
GFR, EST AFRICAN AMERICAN: 51 mL/min — AB (ref >60)
Glucose, Bld: 128 mg/dL — ABNORMAL HIGH (ref 65–99)
Potassium: 3.6 mmol/L (ref 3.5–5.1)
SODIUM: 142 mmol/L (ref 135–145)

## 2016-11-08 LAB — CBC WITH DIFFERENTIAL/PLATELET
BASOS ABS: 0.1 10*3/uL (ref 0–0.1)
BASOS PCT: 1 %
EOS ABS: 0.1 10*3/uL (ref 0–0.7)
EOS PCT: 1 %
HEMATOCRIT: 36.4 % (ref 35.0–47.0)
Hemoglobin: 12.7 g/dL (ref 12.0–16.0)
Lymphocytes Relative: 43 %
Lymphs Abs: 4.2 10*3/uL — ABNORMAL HIGH (ref 1.0–3.6)
MCH: 31.4 pg (ref 26.0–34.0)
MCHC: 34.9 g/dL (ref 32.0–36.0)
MCV: 89.9 fL (ref 80.0–100.0)
Monocytes Absolute: 0.6 10*3/uL (ref 0.2–0.9)
Monocytes Relative: 7 %
NEUTROS ABS: 4.9 10*3/uL (ref 1.4–6.5)
Neutrophils Relative %: 48 %
PLATELETS: 267 10*3/uL (ref 150–440)
RBC: 4.06 MIL/uL (ref 3.80–5.20)
RDW: 13.8 % (ref 11.5–14.5)
WBC: 10 10*3/uL (ref 3.6–11.0)

## 2016-11-08 LAB — BRAIN NATRIURETIC PEPTIDE: B NATRIURETIC PEPTIDE 5: 135 pg/mL — AB (ref 0.0–100.0)

## 2016-11-08 LAB — HEPATIC FUNCTION PANEL
ALBUMIN: 4 g/dL (ref 3.5–5.0)
ALT: 22 U/L (ref 14–54)
AST: 24 U/L (ref 15–41)
Alkaline Phosphatase: 86 U/L (ref 38–126)
BILIRUBIN TOTAL: 0.4 mg/dL (ref 0.3–1.2)
Bilirubin, Direct: <0.1 mg/dL — ABNORMAL LOW (ref 0.1–0.5)
TOTAL PROTEIN: 6.8 g/dL (ref 6.5–8.1)

## 2016-11-08 LAB — TROPONIN I

## 2016-11-08 MED ORDER — HALOPERIDOL LACTATE 5 MG/ML IJ SOLN
5.0000 mg | Freq: Once | INTRAMUSCULAR | Status: AC
  Administered 2016-11-08: 5 mg via INTRAVENOUS
  Filled 2016-11-08: qty 1

## 2016-11-08 MED ORDER — KETOROLAC TROMETHAMINE 30 MG/ML IJ SOLN
15.0000 mg | Freq: Once | INTRAMUSCULAR | Status: AC
  Administered 2016-11-08: 15 mg via INTRAVENOUS
  Filled 2016-11-08: qty 1

## 2016-11-08 NOTE — Discharge Instructions (Signed)
Fortunately today your blood work was reassuring and her x-ray was normal. Please follow-up with your primary care physician on Monday for recheck. Return to the emergency department for any concerns.  It was a pleasure to take care of you today, and thank you for coming to our emergency department.  If you have any questions or concerns before leaving please ask the nurse to grab me and I'm more than happy to go through your aftercare instructions again.  If you were prescribed any opioid pain medication today such as Norco, Vicodin, Percocet, morphine, hydrocodone, or oxycodone please make sure you do not drive when you are taking this medication as it can alter your ability to drive safely.  If you have any concerns once you are home that you are not improving or are in fact getting worse before you can make it to your follow-up appointment, please do not hesitate to call 911 and come back for further evaluation.  Darel Hong MD  Results for orders placed or performed during the hospital encounter of 69/67/89  Basic metabolic panel  Result Value Ref Range   Sodium 142 135 - 145 mmol/L   Potassium 3.6 3.5 - 5.1 mmol/L   Chloride 107 101 - 111 mmol/L   CO2 24 22 - 32 mmol/L   Glucose, Bld 128 (H) 65 - 99 mg/dL   BUN 29 (H) 6 - 20 mg/dL   Creatinine, Ser 1.33 (H) 0.44 - 1.00 mg/dL   Calcium 8.9 8.9 - 10.3 mg/dL   GFR calc non Af Amer 44 (L) >60 mL/min   GFR calc Af Amer 51 (L) >60 mL/min   Anion gap 11 5 - 15  Hepatic function panel  Result Value Ref Range   Total Protein 6.8 6.5 - 8.1 g/dL   Albumin 4.0 3.5 - 5.0 g/dL   AST 24 15 - 41 U/L   ALT 22 14 - 54 U/L   Alkaline Phosphatase 86 38 - 126 U/L   Total Bilirubin 0.4 0.3 - 1.2 mg/dL   Bilirubin, Direct <0.1 (L) 0.1 - 0.5 mg/dL   Indirect Bilirubin NOT CALCULATED 0.3 - 0.9 mg/dL  Troponin I  Result Value Ref Range   Troponin I <0.03 <0.03 ng/mL  CBC with Differential  Result Value Ref Range   WBC 10.0 3.6 - 11.0 K/uL   RBC 4.06 3.80 - 5.20 MIL/uL   Hemoglobin 12.7 12.0 - 16.0 g/dL   HCT 36.4 35.0 - 47.0 %   MCV 89.9 80.0 - 100.0 fL   MCH 31.4 26.0 - 34.0 pg   MCHC 34.9 32.0 - 36.0 g/dL   RDW 13.8 11.5 - 14.5 %   Platelets 267 150 - 440 K/uL   Neutrophils Relative % 48 %   Neutro Abs 4.9 1.4 - 6.5 K/uL   Lymphocytes Relative 43 %   Lymphs Abs 4.2 (H) 1.0 - 3.6 K/uL   Monocytes Relative 7 %   Monocytes Absolute 0.6 0.2 - 0.9 K/uL   Eosinophils Relative 1 %   Eosinophils Absolute 0.1 0 - 0.7 K/uL   Basophils Relative 1 %   Basophils Absolute 0.1 0 - 0.1 K/uL   Dg Chest Port 1 View  Result Date: 11/08/2016 CLINICAL DATA:  Difficulty breathing.  Right-sided chest pain. EXAM: PORTABLE CHEST 1 VIEW COMPARISON:  June 11, 2016 FINDINGS: The heart size and mediastinal contours are within normal limits. Both lungs are clear. The visualized skeletal structures are unremarkable. IMPRESSION: No active disease. Electronically Signed  By: Dorise Bullion III M.D   On: 11/08/2016 21:36

## 2016-11-08 NOTE — ED Notes (Signed)
Pt calling son for ride home, pt appears drowsy. Agricultural consultant notified

## 2016-11-08 NOTE — ED Provider Notes (Signed)
[facility] Emergency Department Provider Note  ____________________________________________   First MD Initiated Contact with Patient 11/08/16 2106     (approximate)  I have reviewed the triage vital signs and the nursing notes.   HISTORY  Chief Complaint Shortness of Breath    HPI Samantha Macias is a 56 y.o. female who comes to the emergency department via EMS with sharp intermittent right-sided chest pain that radiates from under her right breast around to her right side that began about half hour prior to arrival when she woke up from a nap. She has a complex past medical history but has recently been treated with steroids for costochondritis. This feels like her costochondritis pain. Her pain is improved when sitting up and worse when lying flat twisting turning or taking a deep breath. It is not crushing it does not radiate to her back is not ripping or tearing. She is not short of breath. She denies abdominal pain nausea or vomiting.   Past Medical History:  Diagnosis Date  . Abnormal gait   . Allergy   . Anxiety   . Arthritis   . CAD (coronary artery disease)   . Chronic kidney disease   . Collagen vascular disease ([place])   . Colon polyp   . Fibromyalgia   . GERD (gastroesophageal reflux disease)   . Hyperlipidemia   . Hypertension   . IBS (irritable bowel syndrome)   . Kidney disease   . Migraine   . Renal insufficiency   . Spinal stenosis   . Static encephalopathy   . TIA (transient ischemic attack)     Patient Active Problem List   Diagnosis Date Noted  . Vitamin D deficiency 03/28/2016  . HLD (hyperlipidemia) 03/28/2016  . Anxiety 03/28/2016  . 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 Arthralgia of multiple joints 02/17/2016  . Thyromegaly 08/18/2015  . Thyroid cyst 08/18/2015  . Osteoarthritis of both hands 08/01/2015  . Hypertension, benign 07/01/2015  . Hyperparathyroidism, secondary renal ([place]) 12/03/2014  .  Allergic rhinitis 12/01/2014  . Anemia in chronic illness 12/01/2014  . GAD (generalized anxiety disorder) 12/01/2014  . Back pain, chronic 12/01/2014  . Bipolar affective disorder, currently depressed, mild ([place]) 12/01/2014  . Atherosclerosis of left carotid artery 12/01/2014  . Carpal tunnel syndrome 12/01/2014  . Chronic kidney disease (CKD), stage III (moderate) 12/01/2014  . Insomnia, persistent 12/01/2014  . Chronic pain syndrome 12/01/2014  . CN (constipation) 12/01/2014  . Kidney cysts 12/01/2014  . Cervical osteoarthritis 12/01/2014  . Dyslipidemia 12/01/2014  . Fibromyalgia syndrome 12/01/2014  . Abnormal gait 12/01/2014  . Gastro-esophageal reflux disease without esophagitis 12/01/2014  . History of colon polyps 12/01/2014  . History of syncope 12/01/2014  . H/O transient cerebral ischemia 12/01/2014  . HPV test positive 12/01/2014  . Recurrent falls 12/01/2014  . Calculus of kidney 12/01/2014  . Lumbar radiculopathy 12/01/2014  . OCD (obsessive compulsive disorder) 12/01/2014  . Overweight 12/01/2014  . PTSD (post-traumatic stress disorder) 12/01/2014  . Trochanteric bursitis of both hips 12/01/2014  . 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 NASAL SINUS SURGERY    . TUBAL LIGATION      Prior to Admission medications   Medication Sig Start Date End Date Taking? Authorizing Provider  ALPRAZolam Duanne Moron) 0.5 MG tablet Take 0.5-0.75 mg by mouth at bedtime as needed for anxiety.    [provider]  aspirin EC 81 MG tablet Take 1  tablet (81 mg total) by mouth at bedtime. 12/01/15   Steele Sizer, MD  Cholecalciferol (VITAMIN D) 2000 units tablet Take 2,000 Units by mouth daily.    [provider]  diclofenac sodium (VOLTAREN) 1 % GEL Apply 4 g topically 4 (four) times daily as needed. Patient taking differently: Apply 4 g topically 4 (four) times daily as needed. For pain 03/27/16   Olin Hauser, DO  hydrALAZINE (APRESOLINE) 25 MG tablet Take 25 mg by mouth 3 (three) times daily as needed. For high blood pressure    [provider]  ibuprofen (ADVIL,MOTRIN) 600 MG tablet Take 1 tablet (600 mg total) by mouth every 6 (six) hours as needed. Patient taking differently: Take 600 mg by mouth every 6 (six) hours as needed for moderate pain.  11/28/15   Sable Feil, PA-C  losartan-hydrochlorothiazide (HYZAAR) 100-12.5 MG tablet Take 1 tablet by mouth daily. 12/01/15   Steele Sizer, MD  oxyCODONE-acetaminophen (ROXICET) 5-325 MG tablet Take 1 tablet by mouth every 4 (four) hours as needed for severe pain. 06/13/16   Drenda Freeze, MD  pregabalin (LYRICA) 200 MG capsule Take 1 capsule (200 mg total) by mouth 3 (three) times daily. 06/13/16   Drenda Freeze, MD  ramelteon (ROZEREM) 8 MG tablet Take 1 tablet (8 mg total) by mouth at bedtime. 03/27/16   Karamalegos, Devonne Doughty, DO  tetrahydrozoline-zinc (VISINE-AC) 0.05-0.25 % ophthalmic solution Place 2 drops into both eyes every morning.    [provider]  tiZANidine (ZANAFLEX) 2 MG tablet Take 1 tablet (2 mg total) by mouth every 6 (six) hours as needed for muscle spasms. 06/13/16   Drenda Freeze, MD  traMADol (ULTRAM) 50 MG tablet Take 1 tablet (50 mg total) by mouth every 8 (eight) hours as needed. Patient taking differently: Take 50 mg by mouth 2 (two) times daily as needed for moderate pain.  12/01/15   Steele Sizer, MD    Allergies Shellfish allergy; Ace inhibitors; Codeine; Nitrofurantoin monohyd macro; Other; Red dye; and Tetanus toxoid adsorbed  Family History  Problem Relation Age of Onset  . Congestive Heart Failure Father   . Alcohol abuse Father   . Heart disease Father   . Heart attack Father   . Peripheral vascular disease Mother   . Anxiety disorder Mother   . Depression Mother   . Thyroid disease Mother   . Heart disease Mother   . Mental illness Mother   . Bipolar  disorder Mother     Social History Social History  Substance Use Topics  . Smoking status: Current Every Day Smoker    Packs/day: 0.50    Years: 25.00    Types: Cigarettes  . Smokeless tobacco: Never Used  . Alcohol use No    Review of Systems Constitutional: No fever/chills Eyes: No visual changes. ENT: No sore throat. Cardiovascular: Positive chest pain. Respiratory: Denies shortness of breath. Gastrointestinal: No abdominal pain.  No nausea, no vomiting.  No diarrhea.  No constipation. Genitourinary: Negative for dysuria. Musculoskeletal: Negative for back pain. Skin: Negative for rash. Neurological: Negative for headaches, focal weakness or numbness.   ____________________________________________   PHYSICAL EXAM:  VITAL SIGNS: ED Triage Vitals [11/08/16 2105]  Enc Vitals Group     BP      Pulse Rate 94     Resp 18     Temp 98.1 F (36.7 C)     Temp Source Oral     SpO2 97 %  Weight      Height      Head Circumference      Peak Flow      Pain Score      Pain Loc      Pain Edu?      Excl. in [place]?     Constitutional: Alert and oriented x 4 Somewhat anxious appearing but in no distress Eyes: PERRL EOMI. Head: Atraumatic. Nose: No congestion/rhinnorhea. Mouth/Throat: No trismus Neck: No stridor.   Cardiovascular: Normal rate, regular rhythm. Grossly normal heart sounds.  Good peripheral circulation. Focally quite tender over lower right chest and lateral right chest Respiratory: Normal respiratory effort.  No retractions. Lungs CTAB and moving good air Gastrointestinal: Soft nontender Musculoskeletal: No lower extremity edema   Neurologic:  Normal speech and language. No gross focal neurologic deficits are appreciated. Skin:  Skin is warm, dry and intact. No rash noted. Psychiatric: Mood and affect are normal. Speech and behavior are normal.   _____________________________________   LABS (all labs ordered are listed, but only abnormal results are  displayed)  Labs Reviewed  BASIC METABOLIC PANEL - Abnormal; Notable for the following:       Result Value   Glucose, Bld 128 (*)    BUN 29 (*)    Creatinine, Ser 1.33 (*)    GFR calc non Af Amer 44 (*)    GFR calc Af Amer 51 (*)    All other components within normal limits  HEPATIC FUNCTION PANEL - Abnormal; Notable for the following:    Bilirubin, Direct <0.1 (*)    All other components within normal limits  CBC WITH DIFFERENTIAL/PLATELET - Abnormal; Notable for the following:    Lymphs Abs 4.2 (*)    All other components within normal limits  TROPONIN I  BRAIN NATRIURETIC PEPTIDE    No signs of acute ischemia __________________________________________  EKG  ED ECG REPORT I, Darel Hong, the attending physician, personally viewed and interpreted this ECG.  Date: 11/08/2016 Rate: 98 Rhythm: normal sinus rhythm QRS Axis: normal Intervals: normal ST/T Wave abnormalities: normal Conduction Disturbances: none Narrative Interpretation: unremarkable ________________________________________  RADIOLOGY  Chest x-ray with no active disease ____________________________________________   PROCEDURES  Procedure(s) performed: no  Procedures  Critical Care performed: no  Observation: no ____________________________________________   INITIAL IMPRESSION / ASSESSMENT AND PLAN / ED COURSE  Pertinent labs & imaging results that were available during my care of the patient were reviewed by me and considered in my medical decision making (see chart for details).  The patient arrives with clear discomfort but atypical chest pain. She is not short of breath and I doubt pulmonary embolism. I will treat her with IV haloperidol as well as Toradol and reevaluate.     ----------------------------------------- 9:54 PM on 11/08/2016 -----------------------------------------  The patient's pain is now resolved and she would like to go home. EKG is unremarkable and her chest  x-ray is normal and her troponin is negative. At this point she is medically stable for outpatient management with follow-up with her primary care physician on Monday. ____________________________________________   FINAL CLINICAL IMPRESSION(S) / ED DIAGNOSES  Final diagnoses:  Atypical chest pain      NEW MEDICATIONS STARTED DURING THIS VISIT:  New Prescriptions   No medications on file     Note:  This document was prepared using Dragon voice recognition software and may include unintentional dictation errors.     Darel Hong, MD 11/08/16 2246

## 2016-11-08 NOTE — ED Triage Notes (Addendum)
Pt to ED via EMS from home with breathing difficulty, pt A&Ox4, NAD noted, pt talking in full sentences. Pt recently diagnosed with costochondritis  and tender to touch throughout chest. VS stable.

## 2016-11-09 NOTE — ED Notes (Signed)
Pt discharged to home.  Family member driving.  Discharge instructions reviewed.  Verbalized understanding.  No questions or concerns at this time.  Teach back verified.  Pt in NAD.  No items left in ED.   

## 2016-11-13 ENCOUNTER — Other Ambulatory Visit: Payer: Self-pay | Admitting: Gastroenterology

## 2016-11-13 DIAGNOSIS — R1310 Dysphagia, unspecified: Secondary | ICD-10-CM

## 2016-11-16 DIAGNOSIS — R072 Precordial pain: Secondary | ICD-10-CM | POA: Insufficient documentation

## 2016-11-16 DIAGNOSIS — R55 Syncope and collapse: Secondary | ICD-10-CM | POA: Insufficient documentation

## 2016-11-19 ENCOUNTER — Ambulatory Visit
Admission: RE | Admit: 2016-11-19 | Discharge: 2016-11-19 | Disposition: A | Discharge status: Home / Self Care | Payer: Medicare Other | Source: Ambulatory Visit | Attending: Gastroenterology | Admitting: Gastroenterology

## 2016-11-19 DIAGNOSIS — R1310 Dysphagia, unspecified: Secondary | ICD-10-CM | POA: Diagnosis present

## 2016-11-21 DIAGNOSIS — F09 Unspecified mental disorder due to known physiological condition: Secondary | ICD-10-CM | POA: Insufficient documentation

## 2016-11-21 DIAGNOSIS — G3184 Mild cognitive impairment, so stated: Secondary | ICD-10-CM | POA: Insufficient documentation

## 2016-12-10 ENCOUNTER — Encounter: Payer: Self-pay | Admitting: Emergency Medicine

## 2016-12-10 ENCOUNTER — Emergency Department: Payer: Medicare Other

## 2016-12-10 ENCOUNTER — Emergency Department
Admission: EM | Admit: 2016-12-10 | Discharge: 2016-12-10 | Disposition: A | Discharge status: Home / Self Care | Payer: Medicare Other | Attending: Emergency Medicine | Admitting: Emergency Medicine

## 2016-12-10 DIAGNOSIS — Y929 Unspecified place or not applicable: Secondary | ICD-10-CM | POA: Insufficient documentation

## 2016-12-10 DIAGNOSIS — W2201XA Walked into wall, initial encounter: Secondary | ICD-10-CM | POA: Insufficient documentation

## 2016-12-10 DIAGNOSIS — I129 Hypertensive chronic kidney disease with stage 1 through stage 4 chronic kidney disease, or unspecified chronic kidney disease: Secondary | ICD-10-CM | POA: Diagnosis not present

## 2016-12-10 DIAGNOSIS — Y999 Unspecified external cause status: Secondary | ICD-10-CM | POA: Insufficient documentation

## 2016-12-10 DIAGNOSIS — S93401A Sprain of unspecified ligament of right ankle, initial encounter: Secondary | ICD-10-CM | POA: Diagnosis not present

## 2016-12-10 DIAGNOSIS — N183 Chronic kidney disease, stage 3 (moderate): Secondary | ICD-10-CM | POA: Diagnosis not present

## 2016-12-10 DIAGNOSIS — F17210 Nicotine dependence, cigarettes, uncomplicated: Secondary | ICD-10-CM | POA: Insufficient documentation

## 2016-12-10 DIAGNOSIS — Z7982 Long term (current) use of aspirin: Secondary | ICD-10-CM | POA: Diagnosis not present

## 2016-12-10 DIAGNOSIS — Z79899 Other long term (current) drug therapy: Secondary | ICD-10-CM | POA: Diagnosis not present

## 2016-12-10 DIAGNOSIS — S99921A Unspecified injury of right foot, initial encounter: Secondary | ICD-10-CM | POA: Diagnosis present

## 2016-12-10 DIAGNOSIS — Y9301 Activity, walking, marching and hiking: Secondary | ICD-10-CM | POA: Insufficient documentation

## 2016-12-10 MED ORDER — PREDNISONE 10 MG PO TABS: 10.0000 mg | ORAL_TABLET | ORAL | 0 refills | Status: DC

## 2016-12-10 MED ORDER — ONDANSETRON 8 MG PO TBDP
8.0000 mg | ORAL_TABLET | Freq: Once | ORAL | Status: AC
  Administered 2016-12-10: 8 mg via ORAL
  Filled 2016-12-10: qty 1

## 2016-12-10 MED ORDER — MORPHINE SULFATE (PF) 2 MG/ML IV SOLN
2.0000 mg | Freq: Once | INTRAVENOUS | Status: AC
  Administered 2016-12-10: 2 mg via INTRAMUSCULAR
  Filled 2016-12-10: qty 1

## 2016-12-10 NOTE — ED Notes (Signed)
See triage note. States she tripped  Having pain to lateral aspect on right foot  Very min swelling noted  Positive pulses

## 2016-12-10 NOTE — ED Triage Notes (Signed)
Patient presents to the ED with right foot pain and swelling post fall.  Patient states she tripped over the threshold of a door.  Patient reports history of fracture to right foot.

## 2016-12-10 NOTE — ED Provider Notes (Signed)
[facility] Emergency Department Provider Note  ____________________________________________  Time seen: Approximately 7:00 PM  I have reviewed the triage vital signs and the nursing notes.   HISTORY  Chief Complaint Foot Pain    HPI Samantha Macias is a 56 y.o. female who presents emergency department complaining of right foot pain. Patient reports that she was walking in flip flops when she stepped on a door jamb. Patient reports that she twisted her ankle. She is now having pain to both medial and lateral aspect of the foot. No swelling. No ecchymosis. No numbness or tingling in her foot. Patient has not been able to bear weight on the foot. No other injury or complaint.   Past Medical History:  Diagnosis Date  . Abnormal gait   . Allergy   . Anxiety   . Arthritis   . CAD (coronary artery disease)   . Chronic kidney disease   . Collagen vascular disease ([place])   . Colon polyp   . Fibromyalgia   . GERD (gastroesophageal reflux disease)   . Hyperlipidemia   . Hypertension   . IBS (irritable bowel syndrome)   . Kidney disease   . Migraine   . Renal insufficiency   . Spinal stenosis   . Static encephalopathy   . TIA (transient ischemic attack)     Patient Active Problem List   Diagnosis Date Noted  . Vitamin D deficiency 03/28/2016  . HLD (hyperlipidemia) 03/28/2016  . Anxiety 03/28/2016  . 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 Arthralgia of multiple joints 02/17/2016  . Thyromegaly 08/18/2015  . Thyroid cyst 08/18/2015  . Osteoarthritis of both hands 08/01/2015  . Hypertension, benign 07/01/2015  . Hyperparathyroidism, secondary renal ([place]) 12/03/2014  . Allergic rhinitis 12/01/2014  . Anemia in chronic illness 12/01/2014  . GAD (generalized anxiety disorder) 12/01/2014  . Back pain, chronic 12/01/2014  . Bipolar affective disorder, currently depressed, mild ([place]) 12/01/2014  . Atherosclerosis of left carotid artery  12/01/2014  . Carpal tunnel syndrome 12/01/2014  . Chronic kidney disease (CKD), stage III (moderate) 12/01/2014  . Insomnia, persistent 12/01/2014  . Chronic pain syndrome 12/01/2014  . CN (constipation) 12/01/2014  . Kidney cysts 12/01/2014  . Cervical osteoarthritis 12/01/2014  . Dyslipidemia 12/01/2014  . Fibromyalgia syndrome 12/01/2014  . Abnormal gait 12/01/2014  . Gastro-esophageal reflux disease without esophagitis 12/01/2014  . History of colon polyps 12/01/2014  . History of syncope 12/01/2014  . H/O transient cerebral ischemia 12/01/2014  . HPV test positive 12/01/2014  . Recurrent falls 12/01/2014  . Calculus of kidney 12/01/2014  . Lumbar radiculopathy 12/01/2014  . OCD (obsessive compulsive disorder) 12/01/2014  . Overweight 12/01/2014  . PTSD (post-traumatic stress disorder) 12/01/2014  . Trochanteric bursitis of both hips 12/01/2014  . 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 NASAL SINUS SURGERY    . TUBAL LIGATION      Prior to Admission medications   Medication Sig Start Date End Date Taking? Authorizing Provider  ALPRAZolam Duanne Moron) 0.5 MG tablet Take 0.5-0.75 mg by mouth at bedtime as needed for anxiety.    [provider]  aspirin EC 81 MG tablet Take 1 tablet (81 mg total) by mouth at bedtime. 12/01/15   Steele Sizer, MD  Cholecalciferol (VITAMIN D) 2000 units tablet Take 2,000 Units by mouth daily.    [provider]  diclofenac sodium (VOLTAREN) 1 % GEL Apply 4 g topically 4 (four) times daily as  needed. Patient taking differently: Apply 4 g topically 4 (four) times daily as needed. For pain 03/27/16   Olin Hauser, DO  hydrALAZINE (APRESOLINE) 25 MG tablet Take 25 mg by mouth 3 (three) times daily as needed. For high blood pressure    [provider]  ibuprofen (ADVIL,MOTRIN) 600 MG tablet Take 1 tablet (600 mg total) by mouth every 6 (six) hours as  needed. Patient taking differently: Take 600 mg by mouth every 6 (six) hours as needed for moderate pain.  11/28/15   Sable Feil, PA-C  losartan-hydrochlorothiazide (HYZAAR) 100-12.5 MG tablet Take 1 tablet by mouth daily. 12/01/15   Steele Sizer, MD  oxyCODONE-acetaminophen (ROXICET) 5-325 MG tablet Take 1 tablet by mouth every 4 (four) hours as needed for severe pain. 06/13/16   Drenda Freeze, MD  predniSONE (DELTASONE) 10 MG tablet Take 1 tablet (10 mg total) by mouth as directed. 12/10/16   Zena Vitelli, Charline Bills, PA-C  pregabalin (LYRICA) 200 MG capsule Take 1 capsule (200 mg total) by mouth 3 (three) times daily. 06/13/16   Drenda Freeze, MD  ramelteon (ROZEREM) 8 MG tablet Take 1 tablet (8 mg total) by mouth at bedtime. 03/27/16   Karamalegos, Devonne Doughty, DO  tetrahydrozoline-zinc (VISINE-AC) 0.05-0.25 % ophthalmic solution Place 2 drops into both eyes every morning.    [provider]  tiZANidine (ZANAFLEX) 2 MG tablet Take 1 tablet (2 mg total) by mouth every 6 (six) hours as needed for muscle spasms. 06/13/16   Drenda Freeze, MD  traMADol (ULTRAM) 50 MG tablet Take 1 tablet (50 mg total) by mouth every 8 (eight) hours as needed. Patient taking differently: Take 50 mg by mouth 2 (two) times daily as needed for moderate pain.  12/01/15   Steele Sizer, MD    Allergies Shellfish allergy; Ace inhibitors; Codeine; Nitrofurantoin monohyd macro; Other; Red dye; and Tetanus toxoid adsorbed  Family History  Problem Relation Age of Onset  . Congestive Heart Failure Father   . Alcohol abuse Father   . Heart disease Father   . Heart attack Father   . Peripheral vascular disease Mother   . Anxiety disorder Mother   . Depression Mother   . Thyroid disease Mother   . Heart disease Mother   . Mental illness Mother   . Bipolar disorder Mother     Social History Social History  Substance Use Topics  . Smoking status: Current Every Day Smoker    Packs/day: 0.50     Years: 25.00    Types: Cigarettes  . Smokeless tobacco: Never Used  . Alcohol use No     Review of Systems  Constitutional: No fever/chills Cardiovascular: no chest pain. Respiratory: no cough. No SOB. Musculoskeletal: Positive for right foot pain Skin: Negative for rash, abrasions, lacerations, ecchymosis. Neurological: Negative for headaches, focal weakness or numbness. 10-point ROS otherwise negative.  ____________________________________________   PHYSICAL EXAM:  VITAL SIGNS: ED Triage Vitals [12/10/16 1733]  Enc Vitals Group     BP      Pulse      Resp      Temp      Temp src      SpO2      Weight 197 lb (89.4 kg)     Height 5\' 8"  (1.727 m)     Head Circumference      Peak Flow      Pain Score 8     Pain Loc      Pain Edu?  Excl. in [place]?      Constitutional: Alert and oriented. Well appearing and in no acute distress. Eyes: Conjunctivae are normal. PERRL. EOMI. Head: Atraumatic. Neck: No stridor.   Cardiovascular: Normal rate, regular rhythm. Normal S1 and S2.  Good peripheral circulation. Respiratory: Normal respiratory effort without tachypnea or retractions. Lungs CTAB. Good air entry to the bases with no decreased or absent breath sounds. Musculoskeletal: Full range of motion to all extremities. No gross deformities appreciated.No visible deformity or gross edema noted to the right foot upon inspection. Full range of motion to the ankle and all digits right foot. Patient is very tender to palpation over bilateral malleolus. No palpable abnormality. Dorsalis pedis pulse intact. Sensation intact distally. Neurologic:  Normal speech and language. No gross focal neurologic deficits are appreciated.  Skin:  Skin is warm, dry and intact. No rash noted. Psychiatric: Mood and affect are normal. Speech and behavior are normal. Patient exhibits appropriate insight and judgement.   ____________________________________________   LABS (all labs ordered are  listed, but only abnormal results are displayed)  Labs Reviewed - No data to display ____________________________________________  EKG   ____________________________________________  RADIOLOGY Diamantina Providence Danzig Macgregor, personally viewed and evaluated these images (plain radiographs) as part of my medical decision making, as well as reviewing the written report by the radiologist.  Dg Foot Complete Right  Result Date: 12/10/2016 CLINICAL DATA:  Right foot pain, swelling status post fall EXAM: RIGHT FOOT COMPLETE - 3+ VIEW COMPARISON:  11/28/2015 FINDINGS: No fracture or dislocation is seen. Very mild degenerative changes of the 1st MTP joint. Visualized soft tissues are within normal limits. Tiny plantar and posterior calcaneal enthesophytes. IMPRESSION: Negative. Electronically Signed   By: Julian Hy M.D.   On: 12/10/2016 17:59    ____________________________________________    PROCEDURES  Procedure(s) performed:    Procedures    Medications  morphine 2 MG/ML injection 2 mg (not administered)  ondansetron (ZOFRAN-ODT) disintegrating tablet 8 mg (not administered)     ____________________________________________   INITIAL IMPRESSION / ASSESSMENT AND PLAN / ED COURSE  Pertinent labs & imaging results that were available during my care of the patient were reviewed by me and considered in my medical decision making (see chart for details).  Review of the  CSRS was performed in accordance of the [place] prior to dispensing any controlled drugs.     Patient's diagnosis is consistent with right ankle sprain. X-ray reveals no acute osseous abnormality. Exam is reassuring. Patient is given pain medicine emergency department. She is on chronic pain management for ongoing lupus. No prescription narcotics at this time. Patient will be discharged home with prescriptions for course of steroids for inflammation control as patient is in stage III renal failure. Patient is to  follow up with primary care as needed or otherwise directed. Patient is given ED precautions to return to the ED for any worsening or new symptoms.     ____________________________________________  FINAL CLINICAL IMPRESSION(S) / ED DIAGNOSES  Final diagnoses:  Sprain of right ankle, unspecified ligament, initial encounter      NEW MEDICATIONS STARTED DURING THIS VISIT:  New Prescriptions   PREDNISONE (DELTASONE) 10 MG TABLET    Take 1 tablet (10 mg total) by mouth as directed.        This chart was dictated using voice recognition software/Dragon. Despite best efforts to proofread, errors can occur which can change the meaning. Any change was purely unintentional.    Darletta Moll, PA-C 12/10/16 1953  Darel Hong, MD 12/10/16 2230

## 2016-12-19 IMAGING — MR MR MRA HEAD W/O CM
11 series · 40 of 48 positions shown · non-contrast
Comparison: 07/07/2015 head CT.  02/10/2014 brain MR.

CLINICAL DATA: 54-year-old hypertensive female with hyperlipidemia
presenting with drooling and difficulty finding words with
dizziness. Subsequent encounter.

EXAM:
MRI HEAD WITHOUT CONTRAST
MRA HEAD WITHOUT CONTRAST
TECHNIQUE: Multiplanar, multiecho pulse sequences of the brain and surrounding
structures were obtained without intravenous contrast. Angiographic
images of the head were obtained using MRA technique without
contrast.

[Series 2: TOF · axial · non-contrast · 0.7mm · 0.37mm/px · z∈[-33,+72]mm · 7 of 180 slices shown]
[im 1/180]
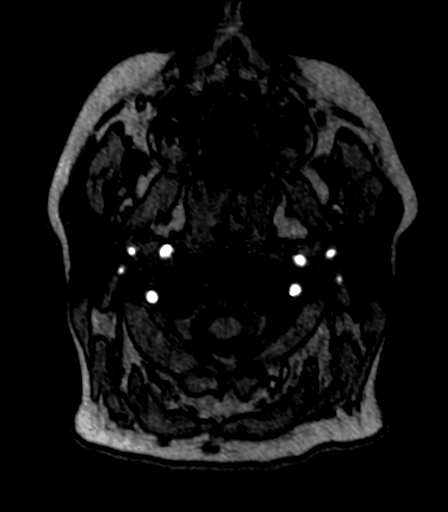
[im 26/180]
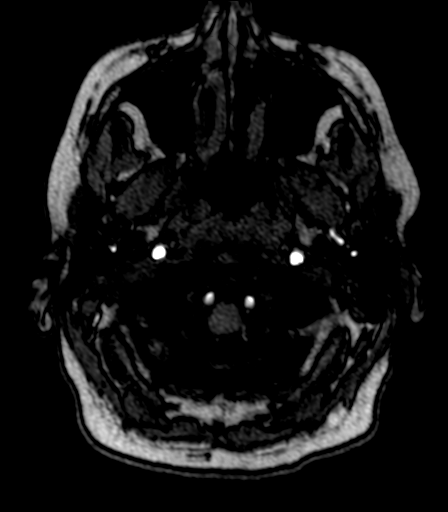
[im 52/180]
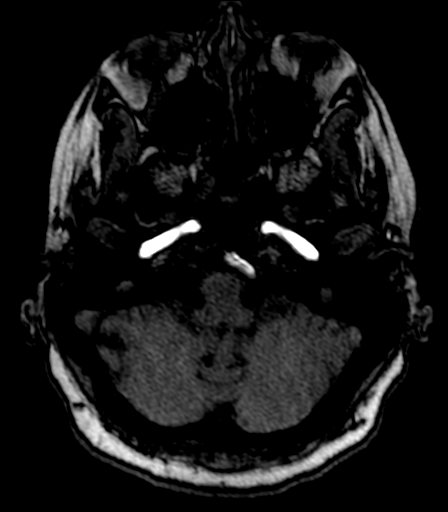
[im 77/180]
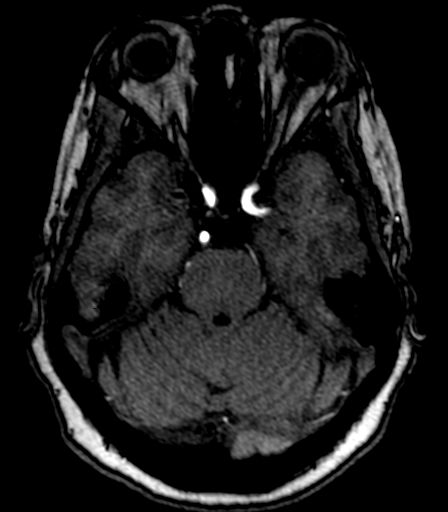
[im 103/180]
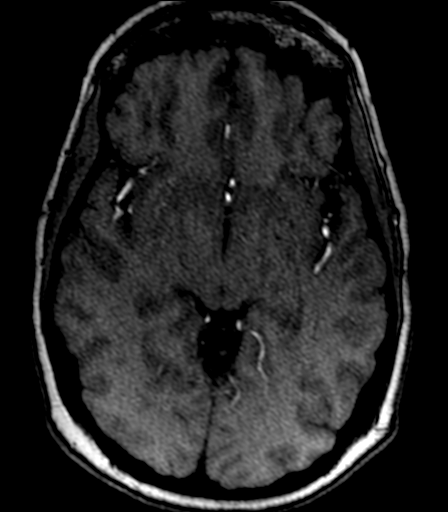
[im 128/180]
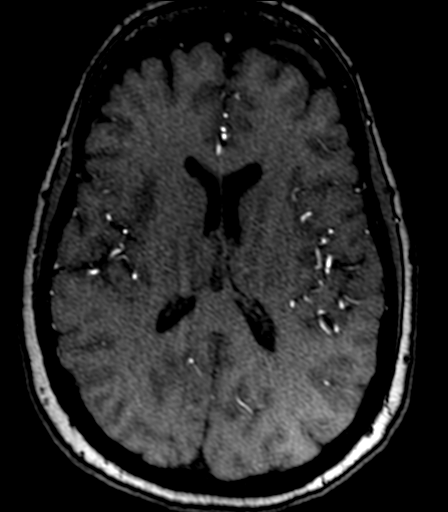
[im 154/180]
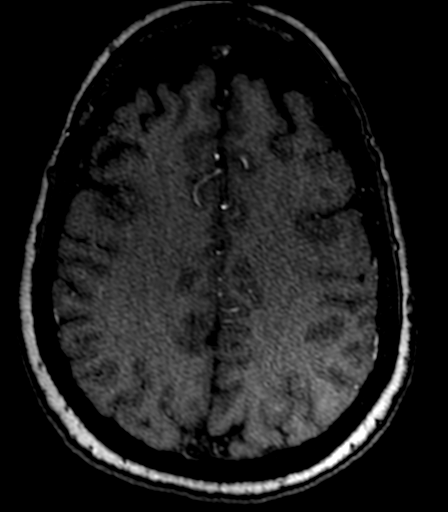

[Series 8: T1 · sagittal · 5.0mm · 0.45mm/px · 2 of 27 slices shown (1 of 2)]
[im 1/27]
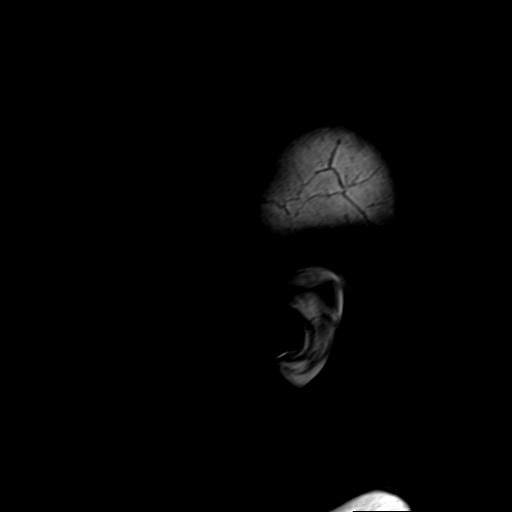
[im 27/27]
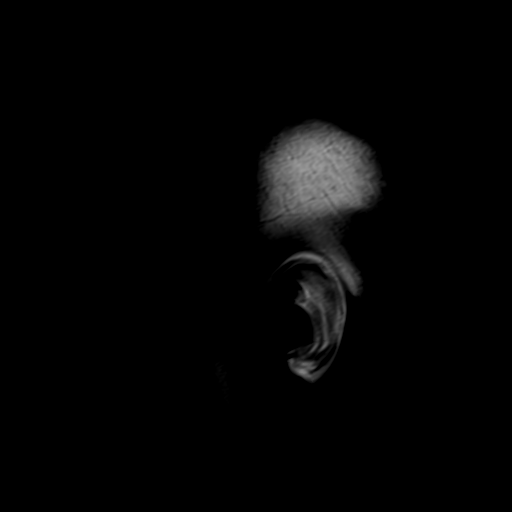

[Series 10: DWI · axial · 3.0mm · 1.80mm/px · z∈[-25,+135]mm · 5 of 55 slices shown (1 of 4)]
[im 1/55]
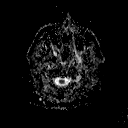
[im 14/55]
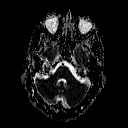
[im 28/55]
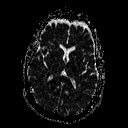
[im 41/55]
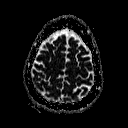
[im 55/55]
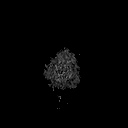

[Series 12: DWI · coronal · 3.0mm · 1.80mm/px · 4 of 49 slices shown (2 of 4)]
[im 1/49]
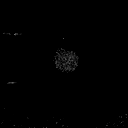
[im 17/49]
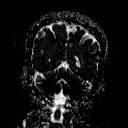
[im 33/49]
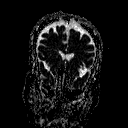
[im 49/49]
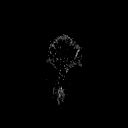

[Series 13: T2 · axial · 5.0mm · 0.45mm/px · z∈[-24,+130]mm · 2 of 25 slices shown (1 of 3)]
[im 1/25]
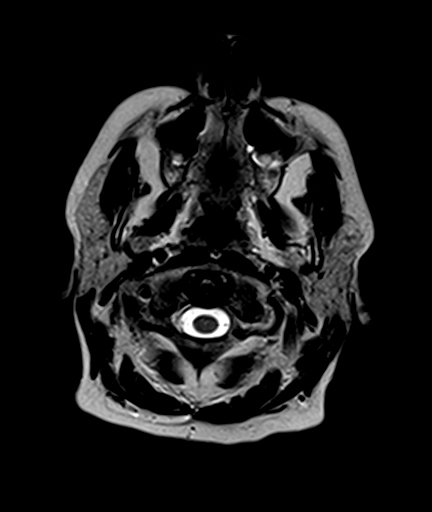
[im 25/25]
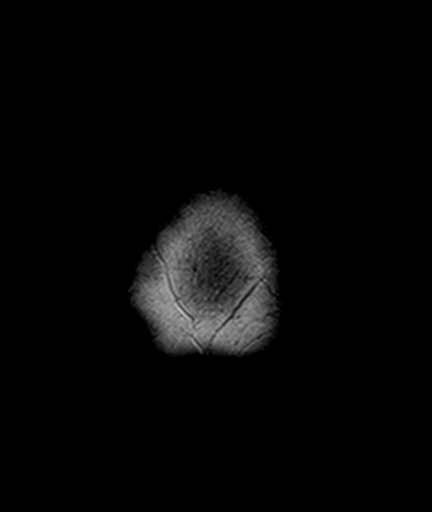

[Series 14: FLAIR · axial · 5.0mm · 0.45mm/px · z∈[-24,+130]mm · 2 of 25 slices shown]
[im 1/25]
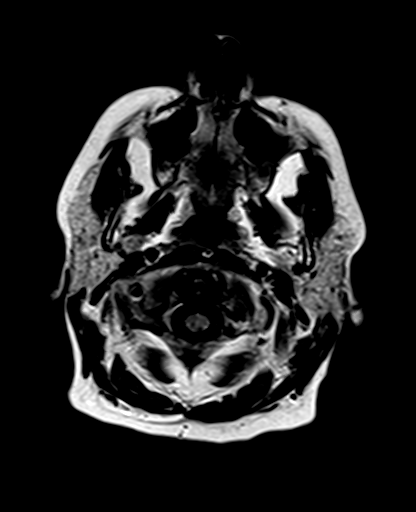
[im 25/25]
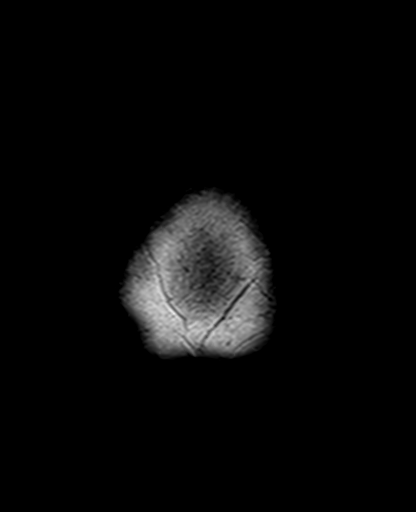

[Series 15: T2 · axial · 5.0mm · 0.45mm/px · z∈[-23,+131]mm · 2 of 25 slices shown (2 of 3)]
[im 1/25]
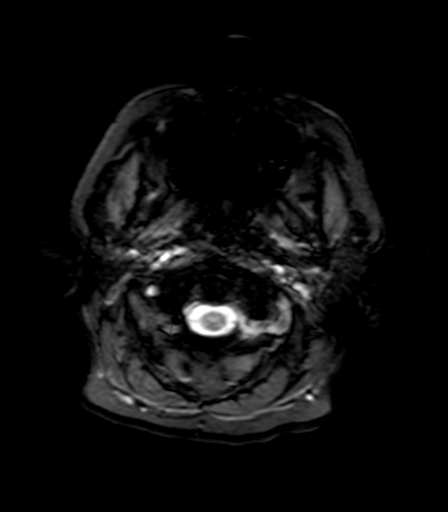
[im 25/25]
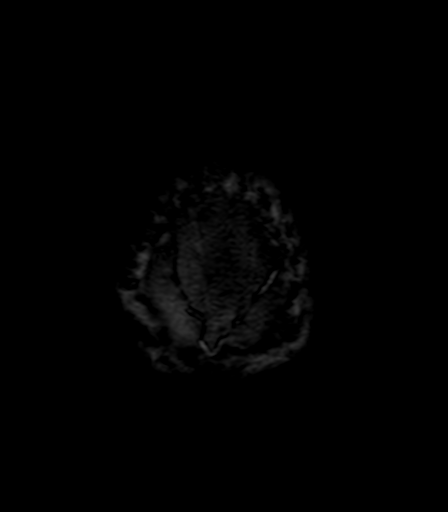

[Series 16: T1 · axial · 3.0mm · 1.00mm/px · z∈[-32,+142]mm · 5 of 60 slices shown (2 of 2)]
[im 1/60]
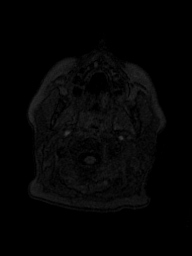
[im 15/60]
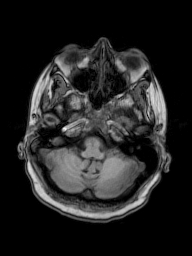
[im 30/60]
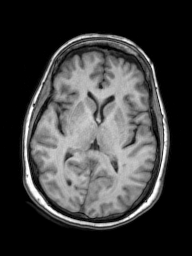
[im 45/60]
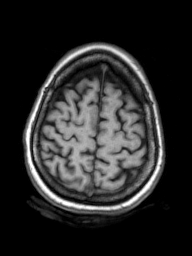
[im 60/60]
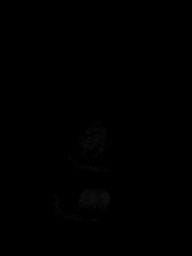

[Series 17: T2 · coronal · 5.0mm · 0.45mm/px · 3 of 31 slices shown (3 of 3)]
[im 1/31]
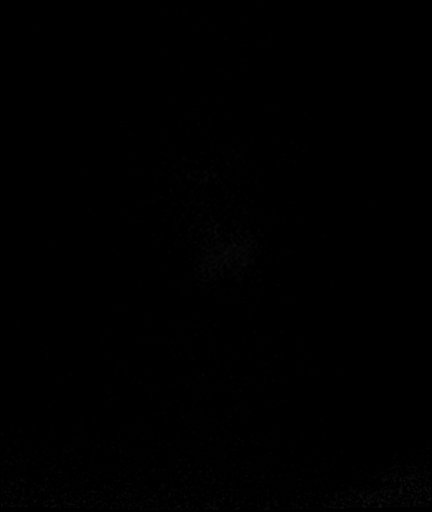
[im 16/31]
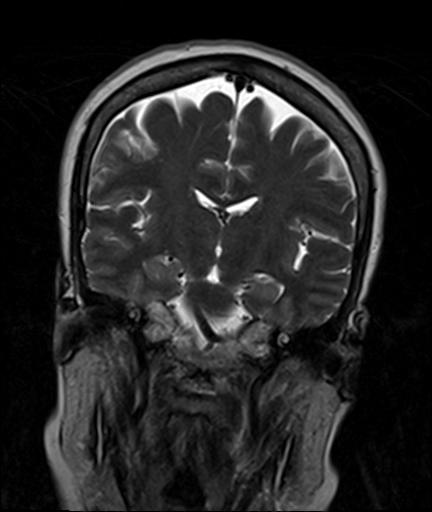
[im 31/31]
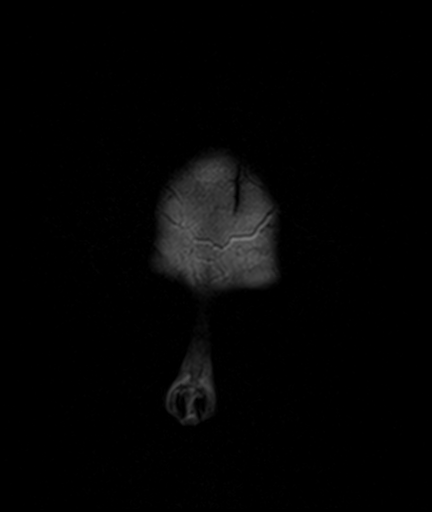

[Series 102: DWI · axial · 3.0mm · 1.80mm/px · z∈[-25,+135]mm · 4 of 54 slices shown (3 of 4)]
[im 1/54]
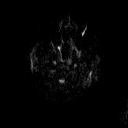
[im 18/54]
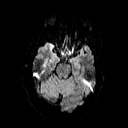
[im 36/54]
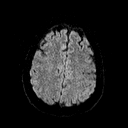
[im 54/54]
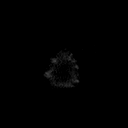

[Series 107: DWI · coronal · 3.0mm · 1.80mm/px · 4 of 48 slices shown (4 of 4)]
[im 1/48]
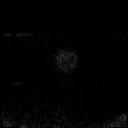
[im 16/48]
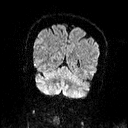
[im 32/48]
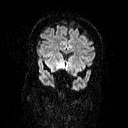
[im 48/48]
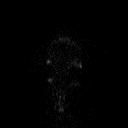

[40 of 48 positions shown; findings below may reference images not displayed]

FINDINGS: MRI HEAD FINDINGS

No acute infarct or intracranial hemorrhage.

Minimal matter changes suggestive of minimal chronic small vessel
disease.

No intracranial mass lesion noted on this unenhanced exam.

Mild atrophy without hydrocephalus.

Mild exophthalmos.  Orbital structures otherwise unremarkable.

Right C2-3 facet degenerative changes. Congenital narrowing upper
cervical canal. Cervical medullary junction unremarkable.

MRA HEAD FINDINGS

Anterior circulation without medium or large size vessel significant
stenosis or occlusion.

Mild anterior cerebral artery and middle cerebral artery branch
vessel irregularity.

Mild ectasia vertebral arteries and basilar artery. Mild narrowing
distal right vertebral artery.

Poor delineation of left posterior inferior cerebellar artery. Small
right posterior inferior cerebellar artery.

Nonvisualized anterior inferior cerebellar arteries.

Mild irregularity superior cerebellar arteries.

Mild narrowing proximal posterior cerebral artery bilaterally. Mild
to slightly moderate narrowing of portions of the distal posterior
cerebral artery branches bilaterally.
IMPRESSION: MRI HEAD

No acute infarct or intracranial hemorrhage.

Minimal matter changes suggestive of minimal chronic small vessel
disease.

No intracranial mass lesion noted on this unenhanced exam.

Mild atrophy without hydrocephalus.

Mild exophthalmos.

MRA HEAD

Intracranial branch vessel irregularity as detailed above.

## 2017-02-05 ENCOUNTER — Other Ambulatory Visit: Payer: Self-pay | Admitting: Family Medicine

## 2017-02-05 DIAGNOSIS — I10 Essential (primary) hypertension: Secondary | ICD-10-CM

## 2017-03-04 DIAGNOSIS — F17210 Nicotine dependence, cigarettes, uncomplicated: Secondary | ICD-10-CM | POA: Insufficient documentation

## 2017-03-04 DIAGNOSIS — G479 Sleep disorder, unspecified: Secondary | ICD-10-CM | POA: Insufficient documentation

## 2017-03-04 DIAGNOSIS — R42 Dizziness and giddiness: Secondary | ICD-10-CM | POA: Insufficient documentation

## 2017-04-01 ENCOUNTER — Encounter
Admission: RE | Disposition: A | Discharge status: Home / Self Care | Payer: Self-pay | Source: Ambulatory Visit | Attending: Gastroenterology

## 2017-04-01 ENCOUNTER — Ambulatory Visit
Admission: RE | Admit: 2017-04-01 | Discharge: 2017-04-01 | Disposition: A | Discharge status: Home / Self Care | Payer: Medicare Other | Source: Ambulatory Visit | Attending: Gastroenterology | Admitting: Gastroenterology

## 2017-04-01 ENCOUNTER — Ambulatory Visit: Payer: Medicare Other | Admitting: Certified Registered Nurse Anesthetist

## 2017-04-01 DIAGNOSIS — J449 Chronic obstructive pulmonary disease, unspecified: Secondary | ICD-10-CM | POA: Diagnosis not present

## 2017-04-01 DIAGNOSIS — K589 Irritable bowel syndrome without diarrhea: Secondary | ICD-10-CM | POA: Diagnosis not present

## 2017-04-01 DIAGNOSIS — K621 Rectal polyp: Secondary | ICD-10-CM | POA: Insufficient documentation

## 2017-04-01 DIAGNOSIS — I739 Peripheral vascular disease, unspecified: Secondary | ICD-10-CM | POA: Diagnosis not present

## 2017-04-01 DIAGNOSIS — K5730 Diverticulosis of large intestine without perforation or abscess without bleeding: Secondary | ICD-10-CM | POA: Insufficient documentation

## 2017-04-01 DIAGNOSIS — M1990 Unspecified osteoarthritis, unspecified site: Secondary | ICD-10-CM | POA: Diagnosis not present

## 2017-04-01 DIAGNOSIS — Z1211 Encounter for screening for malignant neoplasm of colon: Secondary | ICD-10-CM | POA: Insufficient documentation

## 2017-04-01 DIAGNOSIS — E785 Hyperlipidemia, unspecified: Secondary | ICD-10-CM | POA: Diagnosis not present

## 2017-04-01 DIAGNOSIS — F419 Anxiety disorder, unspecified: Secondary | ICD-10-CM | POA: Diagnosis not present

## 2017-04-01 DIAGNOSIS — K219 Gastro-esophageal reflux disease without esophagitis: Secondary | ICD-10-CM | POA: Insufficient documentation

## 2017-04-01 DIAGNOSIS — I129 Hypertensive chronic kidney disease with stage 1 through stage 4 chronic kidney disease, or unspecified chronic kidney disease: Secondary | ICD-10-CM | POA: Diagnosis not present

## 2017-04-01 DIAGNOSIS — Q438 Other specified congenital malformations of intestine: Secondary | ICD-10-CM | POA: Diagnosis not present

## 2017-04-01 DIAGNOSIS — Z86010 Personal history of colonic polyps: Secondary | ICD-10-CM | POA: Diagnosis not present

## 2017-04-01 DIAGNOSIS — M797 Fibromyalgia: Secondary | ICD-10-CM | POA: Insufficient documentation

## 2017-04-01 DIAGNOSIS — Z7982 Long term (current) use of aspirin: Secondary | ICD-10-CM | POA: Insufficient documentation

## 2017-04-01 DIAGNOSIS — K3189 Other diseases of stomach and duodenum: Secondary | ICD-10-CM | POA: Insufficient documentation

## 2017-04-01 DIAGNOSIS — Z8673 Personal history of transient ischemic attack (TIA), and cerebral infarction without residual deficits: Secondary | ICD-10-CM | POA: Insufficient documentation

## 2017-04-01 DIAGNOSIS — K2210 Ulcer of esophagus without bleeding: Secondary | ICD-10-CM | POA: Insufficient documentation

## 2017-04-01 DIAGNOSIS — R1310 Dysphagia, unspecified: Secondary | ICD-10-CM | POA: Insufficient documentation

## 2017-04-01 DIAGNOSIS — M359 Systemic involvement of connective tissue, unspecified: Secondary | ICD-10-CM | POA: Diagnosis not present

## 2017-04-01 DIAGNOSIS — Z91013 Allergy to seafood: Secondary | ICD-10-CM | POA: Insufficient documentation

## 2017-04-01 DIAGNOSIS — F319 Bipolar disorder, unspecified: Secondary | ICD-10-CM | POA: Diagnosis not present

## 2017-04-01 DIAGNOSIS — N189 Chronic kidney disease, unspecified: Secondary | ICD-10-CM | POA: Diagnosis not present

## 2017-04-01 DIAGNOSIS — I2510 Atherosclerotic heart disease of native coronary artery without angina pectoris: Secondary | ICD-10-CM | POA: Diagnosis not present

## 2017-04-01 DIAGNOSIS — Z887 Allergy status to serum and vaccine status: Secondary | ICD-10-CM | POA: Diagnosis not present

## 2017-04-01 DIAGNOSIS — Z888 Allergy status to other drugs, medicaments and biological substances status: Secondary | ICD-10-CM | POA: Insufficient documentation

## 2017-04-01 DIAGNOSIS — Z885 Allergy status to narcotic agent status: Secondary | ICD-10-CM | POA: Insufficient documentation

## 2017-04-01 DIAGNOSIS — Z79899 Other long term (current) drug therapy: Secondary | ICD-10-CM | POA: Diagnosis not present

## 2017-04-01 HISTORY — PX: COLONOSCOPY WITH PROPOFOL: SHX5780

## 2017-04-01 HISTORY — PX: ESOPHAGOGASTRODUODENOSCOPY (EGD) WITH PROPOFOL: SHX5813

## 2017-04-01 SURGERY — COLONOSCOPY WITH PROPOFOL
Anesthesia: General

## 2017-04-01 MED ORDER — PHENYLEPHRINE HCL 10 MG/ML IJ SOLN
INTRAMUSCULAR | Status: DC | PRN
  Administered 2017-04-01: 100 ug via INTRAVENOUS

## 2017-04-01 MED ORDER — PROPOFOL 500 MG/50ML IV EMUL
INTRAVENOUS | Status: AC
  Filled 2017-04-01: qty 50

## 2017-04-01 MED ORDER — PROPOFOL 10 MG/ML IV BOLUS
INTRAVENOUS | Status: DC | PRN
  Administered 2017-04-01: 20 mg via INTRAVENOUS
  Administered 2017-04-01: 40 mg via INTRAVENOUS

## 2017-04-01 MED ORDER — SODIUM CHLORIDE 0.9 % IV SOLN: INTRAVENOUS | Status: DC

## 2017-04-01 MED ORDER — LIDOCAINE HCL (CARDIAC) 20 MG/ML IV SOLN
INTRAVENOUS | Status: DC | PRN
  Administered 2017-04-01: 100 mg via INTRAVENOUS

## 2017-04-01 MED ORDER — PROPOFOL 10 MG/ML IV BOLUS
INTRAVENOUS | Status: AC
  Filled 2017-04-01: qty 40

## 2017-04-01 MED ORDER — PROPOFOL 500 MG/50ML IV EMUL
INTRAVENOUS | Status: DC | PRN
  Administered 2017-04-01: 100 ug/kg/min via INTRAVENOUS

## 2017-04-01 MED ORDER — SODIUM CHLORIDE 0.9 % IV SOLN
INTRAVENOUS | Status: DC
  Administered 2017-04-01: 1000 mL via INTRAVENOUS
  Administered 2017-04-01: 10:00:00 via INTRAVENOUS

## 2017-04-01 NOTE — Op Note (Signed)
[facility] Gastroenterology Patient Name: Samantha Macias Procedure Date: 04/01/2017 9:51 AM MRN: [id number] Account #: [id number] Date of Birth: [birth year] Admit Type: Outpatient Age: 56 Room: [facility] ENDO ROOM 3 Gender: Female Note Status: Finalized Procedure:            Colonoscopy Indications:          Personal history of colonic polyps Providers:            Lollie Sails, MD Referring MD:         Tracie Harrier, MD (Referring MD) Medicines:            Monitored Anesthesia Care Complications:        No immediate complications. Procedure:            Pre-Anesthesia Assessment:                       - ASA Grade Assessment: III - A patient with severe                        systemic disease.                       After obtaining informed consent, the colonoscope was                        passed under direct vision. Throughout the procedure,                        the patient's blood pressure, pulse, and oxygen                        saturations were monitored continuously. The                        Colonoscope was introduced through the anus and                        advanced to the the cecum, identified by appendiceal                        orifice and ileocecal valve. The colonoscopy was                        unusually difficult due to poor bowel prep. Successful                        completion of the procedure was aided by lavage. The                        colonoscopy was unusually difficult due to significant                        looping. Successful completion of the procedure was                        aided by changing the patient to a supine position,                        changing the patient to a prone position and using  manual pressure. The quality of the bowel preparation                        was fair. Findings:      A 2 mm polyp was found in the rectum. The polyp was sessile. The polyp       was removed with a  cold biopsy forceps. Resection and retrieval were       complete.      A few small-mouthed diverticula were found in the sigmoid colon,       descending colon, transverse colon and ascending colon.      The digital rectal exam was normal.      The exam was otherwise without abnormality. Impression:           - Preparation of the colon was fair.                       - One 2 mm polyp in the rectum, removed with a cold                        biopsy forceps. Resected and retrieved.                       - Diverticulosis in the sigmoid colon, in the                        descending colon, in the transverse colon and in the                        ascending colon. Recommendation:       - Discharge patient to home. Procedure Code(s):    --- Professional ---                       [phone], Colonoscopy, flexible; with biopsy, single or                        multiple Diagnosis Code(s):    --- Professional ---                       K62.1, Rectal polyp                       Z86.010, Personal history of colonic polyps                       K57.30, Diverticulosis of large intestine without                        perforation or abscess without bleeding CPT copyright 2016 American Medical Association. All rights reserved. The codes documented in this report are preliminary and upon coder review may  be revised to meet current compliance requirements. Lollie Sails, MD 04/01/2017 10:43:47 AM This report has been signed electronically. Number of Addenda: 0 Note Initiated On: 04/01/2017 9:51 AM Scope Withdrawal Time: 0 hours 8 minutes 19 seconds  Total Procedure Duration: 0 hours 23 minutes 19 seconds       [facility]

## 2017-04-01 NOTE — H&P (Signed)
Outpatient short stay form Pre-procedure 04/01/2017 9:49 AM Lollie Sails MD  Primary Physician: Dr Tracie Harrier  Reason for visit:  EGD and colonoscopy  History of present illness:  Patient is a 56 year old female presenting today as above. She has personal history of adenomatous colon polyps. She also has some occasional dysphagia symptoms both to liquids and solids. She has had a barium swallow that was normal however there may be a little bit of dysmotility in the middle to distal third of the esophagus. There is no stenosis or stricture.  the tablet passed without difficulty. She tolerated her prep well. She takes 81 mg aspirin that has been held. She takes no other aspirin products or blood thinning agents.  Patient is also noted to have a history of collagen vascular disease.    Current Facility-Administered Medications:  .  0.9 %  sodium chloride infusion, , Intravenous, Continuous, Lollie Sails, MD, Last Rate: 20 mL/hr at 04/01/17 0914, 1,000 mL at 04/01/17 0914 .  0.9 %  sodium chloride infusion, , Intravenous, Continuous, Lollie Sails, MD  Prescriptions Prior to Admission  Medication Sig Dispense Refill Last Dose  . ALPRAZolam (XANAX) 0.5 MG tablet Take 0.5-0.75 mg by mouth at bedtime as needed for anxiety.   Past Week at Unknown time  . aspirin EC 81 MG tablet Take 1 tablet (81 mg total) by mouth at bedtime.   Past Week at Unknown time  . Cholecalciferol (VITAMIN D) 2000 units tablet Take 2,000 Units by mouth daily.   Past Week at Unknown time  . diclofenac sodium (VOLTAREN) 1 % GEL Apply 4 g topically 4 (four) times daily as needed. (Patient taking differently: Apply 4 g topically 4 (four) times daily as needed. For pain) 100 g 5 Past Week at Unknown time  . hydrALAZINE (APRESOLINE) 25 MG tablet Take 25 mg by mouth 3 (three) times daily as needed. For high blood pressure   Past Week at Unknown time  . ibuprofen (ADVIL,MOTRIN) 600 MG tablet Take 1 tablet (600  mg total) by mouth every 6 (six) hours as needed. (Patient taking differently: Take 600 mg by mouth every 6 (six) hours as needed for moderate pain. ) 30 tablet 0 Past Week at Unknown time  . losartan-hydrochlorothiazide (HYZAAR) 100-12.5 MG tablet Take 1 tablet by mouth daily. 90 tablet 1 03/31/2017 at Unknown time  . predniSONE (DELTASONE) 10 MG tablet Take 1 tablet (10 mg total) by mouth as directed. 21 tablet 0 Past Week at Unknown time  . pregabalin (LYRICA) 200 MG capsule Take 1 capsule (200 mg total) by mouth 3 (three) times daily. 30 capsule 0 04/01/2017 at 0500  . ramelteon (ROZEREM) 8 MG tablet Take 1 tablet (8 mg total) by mouth at bedtime. 90 tablet 1 Past Week at Unknown time  . tetrahydrozoline-zinc (VISINE-AC) 0.05-0.25 % ophthalmic solution Place 2 drops into both eyes every morning.   Past Week at Unknown time  . tiZANidine (ZANAFLEX) 2 MG tablet Take 1 tablet (2 mg total) by mouth every 6 (six) hours as needed for muscle spasms. (Patient taking differently: Take 4 mg by mouth every 6 (six) hours as needed for muscle spasms. ) 15 tablet 5 04/01/2017 at 0500  . traMADol (ULTRAM) 50 MG tablet Take 1 tablet (50 mg total) by mouth every 8 (eight) hours as needed. (Patient taking differently: Take 50 mg by mouth 2 (two) times daily as needed for moderate pain. ) 90 tablet 0 04/01/2017 at 0500  . oxyCODONE-acetaminophen (  ROXICET) 5-325 MG tablet Take 1 tablet by mouth every 4 (four) hours as needed for severe pain. (Patient not taking: Reported on 04/01/2017) 15 tablet 0 Not Taking at Unknown time     Allergies  Allergen Reactions  . Shellfish Allergy Shortness Of Breath, Anaphylaxis and Nausea And Vomiting    Other reaction(s): Swollen lips  . Ace Inhibitors     cough  . Codeine Hives  . Nitrofurantoin Monohyd Macro Nausea Only  . Other Nausea And Vomiting    Tetanus  . Red Dye Itching  . Tetanus Toxoid Adsorbed      Past Medical History:  Diagnosis Date  . Abnormal gait   .  Allergy   . Anxiety   . Arthritis   . CAD (coronary artery disease)   . Chronic kidney disease   . Collagen vascular disease ([place])   . Colon polyp   . Fibromyalgia   . GERD (gastroesophageal reflux disease)   . Hyperlipidemia   . Hypertension   . IBS (irritable bowel syndrome)   . Kidney disease   . Migraine   . Renal insufficiency   . Spinal stenosis   . Static encephalopathy   . TIA (transient ischemic attack)     Review of systems:      Physical Exam    Heart and lungs: Regular rate and rhythm without rub or gallop, lungs are bilaterally clear.    HEENT: Normocephalic atraumatic eyes are anicteric    Other:     Pertinant exam for procedure: Soft nontender nondistended bowel sounds positive normoactive    Planned proceedures: EGD, colonoscopy and indicated procedures. I have discussed the risks benefits and complications of procedures to include not limited to bleeding, infection, perforation and the risk of sedation and the patient wishes to proceed.    Lollie Sails, MD Gastroenterology 04/01/2017  9:49 AM

## 2017-04-01 NOTE — Anesthesia Preprocedure Evaluation (Signed)
Anesthesia Evaluation  Patient identified by MRN, date of birth, ID band Patient awake    Reviewed: Allergy & Precautions  Airway Mallampati: II       Dental  (+) Teeth Intact   Pulmonary COPD, Current Smoker,     + decreased breath sounds      Cardiovascular hypertension, Pt. on medications + CAD and + Peripheral Vascular Disease   Rhythm:Regular     Neuro/Psych  Headaches, Anxiety Depression Bipolar Disorder TIA   GI/Hepatic Neg liver ROS, GERD  Medicated,  Endo/Other  Morbid obesity  Renal/GU      Musculoskeletal  (+) Fibromyalgia -  Abdominal (+) + obese,   Peds negative pediatric ROS (+)  Hematology   Anesthesia Other Findings   Reproductive/Obstetrics                             Anesthesia Physical Anesthesia Plan  ASA: III  Anesthesia Plan: General   Post-op Pain Management:    Induction: Intravenous  PONV Risk Score and Plan:   Airway Management Planned: Natural Airway and Nasal Cannula  Additional Equipment:   Intra-op Plan:   Post-operative Plan:   Informed Consent: I have reviewed the patients History and Physical, chart, labs and discussed the procedure including the risks, benefits and alternatives for the proposed anesthesia with the patient or authorized representative who has indicated his/her understanding and acceptance.     Plan Discussed with: CRNA  Anesthesia Plan Comments:         Anesthesia Quick Evaluation

## 2017-04-01 NOTE — Anesthesia Postprocedure Evaluation (Signed)
Anesthesia Post Note  Patient: Samantha Macias  Procedure(s) Performed: COLONOSCOPY WITH PROPOFOL (N/A ) ESOPHAGOGASTRODUODENOSCOPY (EGD) WITH PROPOFOL (N/A )  Patient location during evaluation: PACU Anesthesia Type: General Level of consciousness: awake Pain management: pain level controlled Vital Signs Assessment: post-procedure vital signs reviewed and stable Cardiovascular status: stable Anesthetic complications: no     Last Vitals:  Vitals:   04/01/17 1106 04/01/17 1116  BP: 109/84 120/81  Pulse: 66 60  Resp: 13 17  Temp:    SpO2: 99% 100%    Last Pain:  Vitals:   04/01/17 1046  TempSrc: Tympanic                 VAN STAVEREN,Doloras [place]

## 2017-04-01 NOTE — Transfer of Care (Signed)
Immediate Anesthesia Transfer of Care Note  Patient: Samantha Macias  Procedure(s) Performed: COLONOSCOPY WITH PROPOFOL (N/A ) ESOPHAGOGASTRODUODENOSCOPY (EGD) WITH PROPOFOL (N/A )  Patient Location: PACU and Endoscopy Unit  Anesthesia Type:General  Level of Consciousness: awake, alert , oriented and patient cooperative  Airway & Oxygen Therapy: Patient Spontanous Breathing and Patient connected to nasal cannula oxygen  Post-op Assessment: Report given to RN, Post -op Vital signs reviewed and stable and Patient moving all extremities  Post vital signs: Reviewed and stable  Last Vitals:  Vitals:   04/01/17 0853 04/01/17 1046  BP: 103/72 93/73  Pulse: 74 69  Resp: 16 18  Temp: (!) 36.1 C (!) 35.6 C  SpO2: 99% 99%    Last Pain:  Vitals:   04/01/17 1046  TempSrc: Tympanic         Complications: No apparent anesthesia complications

## 2017-04-01 NOTE — Anesthesia Post-op Follow-up Note (Signed)
Anesthesia QCDR form completed.        

## 2017-04-01 NOTE — Op Note (Signed)
[facility] Gastroenterology Patient Name: Ada Holness Procedure Date: 04/01/2017 9:52 AM MRN: [id number] Account #: [id number] Date of Birth: [birth year] Admit Type: Outpatient Age: 56 Room: [facility] ENDO ROOM 3 Gender: Female Note Status: Finalized Procedure:            Upper GI endoscopy Indications:          Dyspepsia, Dysphagia Providers:            Lollie Sails, MD Referring MD:         Tracie Harrier, MD (Referring MD) Medicines:            Monitored Anesthesia Care Complications:        No immediate complications. Procedure:            Pre-Anesthesia Assessment:                       - ASA Grade Assessment: III - A patient with severe                        systemic disease.                       After obtaining informed consent, the endoscope was                        passed under direct vision. Throughout the procedure,                        the patient's blood pressure, pulse, and oxygen                        saturations were monitored continuously. The Endoscope                        was introduced through the mouth, and advanced to the                        third part of duodenum. The upper GI endoscopy was                        accomplished without difficulty. The patient tolerated                        the procedure well. Findings:      The Z-line was variable.      LA Grade A (one or more mucosal breaks less than 5 mm, not extending       between tops of 2 mucosal folds) esophagitis with no bleeding was found.      The exam of the esophagus was otherwise normal.      Diffuse and patchy mild inflammation characterized by congestion       (edema), erosions and erythema was found in the gastric body and in the       gastric antrum. Biopsies were taken with a cold forceps for histology.      The cardia and gastric fundus were normal on retroflexion.      The examined duodenum was normal. Impression:           - Z-line variable.                 - LA Grade A erosive esophagitis.                       -  Bile erosive gastritis. Biopsied.                       - Normal examined duodenum. Recommendation:       - Use Protonix (pantoprazole) 40 mg PO daily daily. Procedure Code(s):    --- Professional ---                       [id number], Esophagogastroduodenoscopy, flexible, transoral;                        with biopsy, single or multiple Diagnosis Code(s):    --- Professional ---                       K22.8, Other specified diseases of esophagus                       K20.8, Other esophagitis                       K29.60, Other gastritis without bleeding                       R10.13, Epigastric pain                       R13.10, Dysphagia, unspecified CPT copyright 2016 American Medical Association. All rights reserved. The codes documented in this report are preliminary and upon coder review may  be revised to meet current compliance requirements. Lollie Sails, MD 04/01/2017 10:14:36 AM This report has been signed electronically. Number of Addenda: 0 Note Initiated On: 04/01/2017 9:52 AM      [facility]

## 2017-04-02 LAB — SURGICAL PATHOLOGY

## 2017-04-03 ENCOUNTER — Encounter: Payer: Self-pay | Admitting: Gastroenterology

## 2017-04-19 ENCOUNTER — Other Ambulatory Visit: Payer: Self-pay | Admitting: Student

## 2017-04-19 DIAGNOSIS — M79604 Pain in right leg: Secondary | ICD-10-CM

## 2017-04-19 DIAGNOSIS — M79602 Pain in left arm: Principal | ICD-10-CM

## 2017-04-19 DIAGNOSIS — M79605 Pain in left leg: Secondary | ICD-10-CM

## 2017-04-19 DIAGNOSIS — M79601 Pain in right arm: Secondary | ICD-10-CM

## 2017-05-02 ENCOUNTER — Ambulatory Visit: Payer: Medicare Other

## 2017-05-02 ENCOUNTER — Ambulatory Visit
Admission: RE | Admit: 2017-05-02 | Discharge: 2017-05-02 | Disposition: A | Discharge status: Home / Self Care | Payer: Medicare Other | Source: Ambulatory Visit | Attending: Student | Admitting: Student

## 2017-05-17 ENCOUNTER — Encounter
Admission: RE | Admit: 2017-05-17 | Discharge: 2017-05-17 | Disposition: A | Discharge status: Home / Self Care | Payer: Medicare Other | Source: Ambulatory Visit | Attending: Student | Admitting: Student

## 2017-05-17 ENCOUNTER — Ambulatory Visit
Admission: RE | Admit: 2017-05-17 | Discharge: 2017-05-17 | Disposition: A | Discharge status: Home / Self Care | Payer: Medicare Other | Source: Ambulatory Visit | Attending: Student | Admitting: Student

## 2017-05-17 DIAGNOSIS — M48061 Spinal stenosis, lumbar region without neurogenic claudication: Secondary | ICD-10-CM | POA: Diagnosis not present

## 2017-05-17 DIAGNOSIS — M79604 Pain in right leg: Secondary | ICD-10-CM

## 2017-05-17 DIAGNOSIS — M4802 Spinal stenosis, cervical region: Secondary | ICD-10-CM | POA: Insufficient documentation

## 2017-05-17 DIAGNOSIS — M79601 Pain in right arm: Secondary | ICD-10-CM | POA: Diagnosis present

## 2017-05-17 DIAGNOSIS — M79602 Pain in left arm: Secondary | ICD-10-CM

## 2017-05-17 DIAGNOSIS — M79605 Pain in left leg: Principal | ICD-10-CM

## 2017-05-17 DIAGNOSIS — M542 Cervicalgia: Secondary | ICD-10-CM | POA: Diagnosis present

## 2017-05-17 DIAGNOSIS — M5136 Other intervertebral disc degeneration, lumbar region: Secondary | ICD-10-CM | POA: Insufficient documentation

## 2017-06-04 HISTORY — PX: DENTAL SURGERY: SHX609

## 2017-06-27 DIAGNOSIS — G959 Disease of spinal cord, unspecified: Secondary | ICD-10-CM | POA: Insufficient documentation

## 2017-07-02 DIAGNOSIS — M4712 Other spondylosis with myelopathy, cervical region: Secondary | ICD-10-CM | POA: Insufficient documentation

## 2017-07-05 DIAGNOSIS — E041 Nontoxic single thyroid nodule: Secondary | ICD-10-CM

## 2017-07-05 HISTORY — DX: Nontoxic single thyroid nodule: E04.1

## 2017-07-10 ENCOUNTER — Ambulatory Visit
Admission: RE | Admit: 2017-07-10 | Discharge: 2017-07-10 | Disposition: A | Discharge status: Home / Self Care | Payer: Medicare Other | Source: Ambulatory Visit | Attending: Neurosurgery | Admitting: Neurosurgery

## 2017-07-10 ENCOUNTER — Encounter
Admission: RE | Admit: 2017-07-10 | Discharge: 2017-07-10 | Disposition: A | Discharge status: Home / Self Care | Payer: Medicare Other | Source: Ambulatory Visit | Attending: Neurosurgery | Admitting: Neurosurgery

## 2017-07-10 ENCOUNTER — Other Ambulatory Visit: Payer: Self-pay

## 2017-07-10 DIAGNOSIS — Z01818 Encounter for other preprocedural examination: Secondary | ICD-10-CM | POA: Insufficient documentation

## 2017-07-10 HISTORY — DX: Unspecified fall, initial encounter: W19.XXXA

## 2017-07-10 HISTORY — DX: Repeated falls: R29.6

## 2017-07-10 HISTORY — DX: Paresthesia of skin: R20.2

## 2017-07-10 HISTORY — DX: Anesthesia of skin: R20.0

## 2017-07-10 HISTORY — DX: Dorsalgia, unspecified: M54.9

## 2017-07-10 HISTORY — DX: Chronic obstructive pulmonary disease, unspecified: J44.9

## 2017-07-10 LAB — TYPE AND SCREEN
ABO/RH(D): B POS
Antibody Screen: NEGATIVE

## 2017-07-10 LAB — BASIC METABOLIC PANEL
Anion gap: 11 (ref 5–15)
BUN: 32 mg/dL — AB (ref 6–20)
CHLORIDE: 110 mmol/L (ref 101–111)
CO2: 26 mmol/L (ref 22–32)
CREATININE: 1.29 mg/dL — AB (ref 0.44–1.00)
Calcium: 9.2 mg/dL (ref 8.9–10.3)
GFR calc Af Amer: 53 mL/min — ABNORMAL LOW (ref >60)
GFR calc non Af Amer: 46 mL/min — ABNORMAL LOW (ref >60)
Glucose, Bld: 91 mg/dL (ref 65–99)
Potassium: 3.5 mmol/L (ref 3.5–5.1)
Sodium: 147 mmol/L — ABNORMAL HIGH (ref 135–145)

## 2017-07-10 LAB — SURGICAL PCR SCREEN
MRSA, PCR: NEGATIVE
STAPHYLOCOCCUS AUREUS: NEGATIVE

## 2017-07-10 LAB — CBC
HEMATOCRIT: 40.4 % (ref 35.0–47.0)
HEMOGLOBIN: 13.6 g/dL (ref 12.0–16.0)
MCH: 30.6 pg (ref 26.0–34.0)
MCHC: 33.7 g/dL (ref 32.0–36.0)
MCV: 90.7 fL (ref 80.0–100.0)
Platelets: 199 10*3/uL (ref 150–440)
RBC: 4.45 MIL/uL (ref 3.80–5.20)
RDW: 12.8 % (ref 11.5–14.5)
WBC: 5.7 10*3/uL (ref 3.6–11.0)

## 2017-07-10 LAB — APTT: aPTT: 27 seconds (ref 24–36)

## 2017-07-10 LAB — PROTIME-INR
INR: 0.91
Prothrombin Time: 12.2 seconds (ref 11.4–15.2)

## 2017-07-10 NOTE — Patient Instructions (Signed)
Your procedure is scheduled on: 07/17/17 Wed Report to Same Day Surgery 2nd floor medical mall [facility] Entrance-take elevator on left to 2nd floor.  Check in with surgery information desk.) To find out your arrival time please call [phone] between 1PM - 3PM on 07/16/17 Tues  Remember: Instructions that are not followed completely may result in serious medical risk, up to and including death, or upon the discretion of your surgeon and anesthesiologist your surgery may need to be rescheduled.    _x___ 1. Do not eat food after midnight the night before your procedure. You may drink clear liquids up to 2 hours before you are scheduled to arrive at the hospital for your procedure.  Do not drink clear liquids within 2 hours of your scheduled arrival to the hospital.  Clear liquids include  --Water or Apple juice without pulp  --Clear carbohydrate beverage such as ClearFast or Gatorade  --Black Coffee or Clear Tea (No milk, no creamers, do not add anything to                  the coffee or Tea Type 1 and type 2 diabetics should only drink water.  No gum chewing or hard candies.     __x__ 2. No Alcohol for 24 hours before or after surgery.   __x__3. No Smoking or e-cigarettes for 24 prior to surgery.  Do not use any chewable tobacco products for at least 6 hour prior to surgery   ____  4. Bring all medications with you on the day of surgery if instructed.    __x__ 5. Notify your doctor if there is any change in your medical condition     (cold, fever, infections).    x___6. On the morning of surgery brush your teeth with toothpaste and water.  You may rinse your mouth with mouth wash if you wish.  Do not swallow any toothpaste or mouthwash.   Do not wear jewelry, make-up, hairpins, clips or nail polish.  Do not wear lotions, powders, or perfumes. You may wear deodorant.  Do not shave 48 hours prior to surgery. Men may shave face and neck.  Do not bring valuables to the hospital.     [facility] is not responsible for any belongings or valuables.               Contacts, dentures or bridgework may not be worn into surgery.  Leave your suitcase in the car. After surgery it may be brought to your room.  For patients admitted to the hospital, discharge time is determined by your                       treatment team.  _  Patients discharged the day of surgery will not be allowed to drive home.  You will need someone to drive you home and stay with you the night of your procedure.    Please read over the following fact sheets that you were given:   [facility] Preparing for Surgery and or MRSA Information   _x___ Take anti-hypertensive listed below, cardiac, seizure, asthma,     anti-reflux and psychiatric medicines. These include:  1. hydrALAZINE (APRESOLINE) 25 MG tablet  2.pregabalin (LYRICA) 200 MG capsule  3.  4.  5.  6.  ____Fleets enema or Magnesium Citrate as directed.   _x___ Use CHG Soap or sage wipes as directed on instruction sheet   ____ Use inhalers on the day of surgery  and bring to hospital day of surgery  ____ Stop Metformin and Janumet 2 days prior to surgery.    ____ Take 1/2 of usual insulin dose the night before surgery and none on the morning     surgery.   _x___ Follow recommendations from Cardiologist, Pulmonologist or PCP regarding          stopping Aspirin, Coumadin, Plavix ,Eliquis, Effient, or Pradaxa, and Pletal.  Stop aspirin 1 week before surgery if OK with physician  X____Stop Anti-inflammatories such as Advil, Aleve, Ibuprofen, Motrin, Naproxen, Naprosyn, Goodies powders or aspirin products. OK to take Tylenol and                          Celebrex.   _x___ Stop supplements until after surgery.  But may continue Vitamin D, Vitamin B,       and multivitamin.   ____ Bring C-Pap to the hospital.

## 2017-07-11 ENCOUNTER — Inpatient Hospital Stay: Admission: RE | Admit: 2017-07-11 | Payer: Medicare Other | Source: Ambulatory Visit

## 2017-07-12 NOTE — Pre-Procedure Instructions (Signed)
Pt notified by phone that she still needs to bring a urine specimen before her surgery on 07/17/2017. She will bring on Monday 07/15/2017.

## 2017-07-15 ENCOUNTER — Encounter
Admission: RE | Admit: 2017-07-15 | Discharge: 2017-07-15 | Disposition: A | Discharge status: Home / Self Care | Payer: Medicare Other | Source: Ambulatory Visit | Attending: Neurosurgery | Admitting: Neurosurgery

## 2017-07-15 DIAGNOSIS — M4802 Spinal stenosis, cervical region: Secondary | ICD-10-CM | POA: Diagnosis not present

## 2017-07-15 DIAGNOSIS — J449 Chronic obstructive pulmonary disease, unspecified: Secondary | ICD-10-CM | POA: Diagnosis not present

## 2017-07-15 DIAGNOSIS — Z8673 Personal history of transient ischemic attack (TIA), and cerebral infarction without residual deficits: Secondary | ICD-10-CM | POA: Diagnosis not present

## 2017-07-15 DIAGNOSIS — K219 Gastro-esophageal reflux disease without esophagitis: Secondary | ICD-10-CM | POA: Diagnosis not present

## 2017-07-15 DIAGNOSIS — I10 Essential (primary) hypertension: Secondary | ICD-10-CM | POA: Diagnosis not present

## 2017-07-15 DIAGNOSIS — I2510 Atherosclerotic heart disease of native coronary artery without angina pectoris: Secondary | ICD-10-CM | POA: Diagnosis not present

## 2017-07-15 DIAGNOSIS — Z5309 Procedure and treatment not carried out because of other contraindication: Secondary | ICD-10-CM | POA: Diagnosis not present

## 2017-07-15 DIAGNOSIS — Z79899 Other long term (current) drug therapy: Secondary | ICD-10-CM | POA: Diagnosis not present

## 2017-07-15 DIAGNOSIS — E785 Hyperlipidemia, unspecified: Secondary | ICD-10-CM | POA: Diagnosis not present

## 2017-07-15 LAB — URINALYSIS, COMPLETE (UACMP) WITH MICROSCOPIC
Bacteria, UA: NONE SEEN
Bilirubin Urine: NEGATIVE
GLUCOSE, UA: NEGATIVE mg/dL
HGB URINE DIPSTICK: NEGATIVE
Ketones, ur: NEGATIVE mg/dL
LEUKOCYTES UA: NEGATIVE
NITRITE: NEGATIVE
PH: 5 (ref 5.0–8.0)
Protein, ur: NEGATIVE mg/dL
RBC / HPF: NONE SEEN RBC/hpf (ref 0–5)
SPECIFIC GRAVITY, URINE: 1.021 (ref 1.005–1.030)

## 2017-07-16 ENCOUNTER — Inpatient Hospital Stay: Payer: Medicare Other | Admitting: Anesthesiology

## 2017-07-17 ENCOUNTER — Other Ambulatory Visit: Payer: Self-pay

## 2017-07-17 ENCOUNTER — Encounter: Payer: Self-pay | Admitting: *Deleted

## 2017-07-17 ENCOUNTER — Ambulatory Visit
Admission: RE | Admit: 2017-07-17 | Discharge: 2017-07-17 | Disposition: A | Discharge status: Home / Self Care | Payer: Medicare Other | Source: Ambulatory Visit | Attending: Neurosurgery | Admitting: Neurosurgery

## 2017-07-17 ENCOUNTER — Encounter
Admission: RE | Disposition: A | Discharge status: Home / Self Care | Payer: Self-pay | Source: Ambulatory Visit | Attending: Neurosurgery

## 2017-07-17 DIAGNOSIS — M4802 Spinal stenosis, cervical region: Secondary | ICD-10-CM | POA: Diagnosis not present

## 2017-07-17 DIAGNOSIS — Z8673 Personal history of transient ischemic attack (TIA), and cerebral infarction without residual deficits: Secondary | ICD-10-CM | POA: Insufficient documentation

## 2017-07-17 DIAGNOSIS — E785 Hyperlipidemia, unspecified: Secondary | ICD-10-CM | POA: Insufficient documentation

## 2017-07-17 DIAGNOSIS — I10 Essential (primary) hypertension: Secondary | ICD-10-CM | POA: Insufficient documentation

## 2017-07-17 DIAGNOSIS — Z5309 Procedure and treatment not carried out because of other contraindication: Secondary | ICD-10-CM | POA: Insufficient documentation

## 2017-07-17 DIAGNOSIS — Z79899 Other long term (current) drug therapy: Secondary | ICD-10-CM | POA: Insufficient documentation

## 2017-07-17 DIAGNOSIS — I2510 Atherosclerotic heart disease of native coronary artery without angina pectoris: Secondary | ICD-10-CM | POA: Insufficient documentation

## 2017-07-17 DIAGNOSIS — Z419 Encounter for procedure for purposes other than remedying health state, unspecified: Secondary | ICD-10-CM

## 2017-07-17 DIAGNOSIS — K219 Gastro-esophageal reflux disease without esophagitis: Secondary | ICD-10-CM | POA: Insufficient documentation

## 2017-07-17 DIAGNOSIS — J449 Chronic obstructive pulmonary disease, unspecified: Secondary | ICD-10-CM | POA: Insufficient documentation

## 2017-07-17 LAB — URINE DRUG SCREEN, QUALITATIVE (ARMC ONLY)
Amphetamines, Ur Screen: NOT DETECTED
BARBITURATES, UR SCREEN: NOT DETECTED
Benzodiazepine, Ur Scrn: POSITIVE — AB
CANNABINOID 50 NG, UR ~~LOC~~: NOT DETECTED
COCAINE METABOLITE, UR ~~LOC~~: POSITIVE — AB
MDMA (ECSTASY) UR SCREEN: NOT DETECTED
Methadone Scn, Ur: NOT DETECTED
OPIATE, UR SCREEN: NOT DETECTED
PHENCYCLIDINE (PCP) UR S: NOT DETECTED
Tricyclic, Ur Screen: POSITIVE — AB

## 2017-07-17 LAB — ABO/RH: ABO/RH(D): B POS

## 2017-07-17 SURGERY — ANTERIOR CERVICAL DECOMPRESSION/DISCECTOMY FUSION 4 LEVELS
Anesthesia: General

## 2017-07-17 MED ORDER — SUCCINYLCHOLINE CHLORIDE 20 MG/ML IJ SOLN
INTRAMUSCULAR | Status: AC
  Filled 2017-07-17: qty 1

## 2017-07-17 MED ORDER — ACETAMINOPHEN 10 MG/ML IV SOLN
INTRAVENOUS | Status: AC
  Filled 2017-07-17: qty 100

## 2017-07-17 MED ORDER — FENTANYL CITRATE (PF) 100 MCG/2ML IJ SOLN
INTRAMUSCULAR | Status: AC
  Filled 2017-07-17: qty 2

## 2017-07-17 MED ORDER — PROPOFOL 500 MG/50ML IV EMUL
INTRAVENOUS | Status: AC
  Filled 2017-07-17: qty 100

## 2017-07-17 MED ORDER — GELATIN ABSORBABLE 12-7 MM EX MISC
CUTANEOUS | Status: AC
  Filled 2017-07-17: qty 1

## 2017-07-17 MED ORDER — DEXAMETHASONE SODIUM PHOSPHATE 10 MG/ML IJ SOLN
INTRAMUSCULAR | Status: AC
  Filled 2017-07-17: qty 1

## 2017-07-17 MED ORDER — ONDANSETRON HCL 4 MG/2ML IJ SOLN
INTRAMUSCULAR | Status: AC
  Filled 2017-07-17: qty 2

## 2017-07-17 MED ORDER — THROMBIN (RECOMBINANT) 5000 UNITS EX SOLR
CUTANEOUS | Status: AC
  Filled 2017-07-17: qty 10000

## 2017-07-17 MED ORDER — MIDAZOLAM HCL 2 MG/2ML IJ SOLN
INTRAMUSCULAR | Status: AC
  Filled 2017-07-17: qty 2

## 2017-07-17 MED ORDER — FAMOTIDINE 20 MG PO TABS: 20.0000 mg | ORAL_TABLET | Freq: Once | ORAL | Status: DC

## 2017-07-17 MED ORDER — PROPOFOL 10 MG/ML IV BOLUS
INTRAVENOUS | Status: AC
  Filled 2017-07-17: qty 20

## 2017-07-17 MED ORDER — SODIUM CHLORIDE FLUSH 0.9 % IV SOLN
INTRAVENOUS | Status: AC
  Filled 2017-07-17: qty 10

## 2017-07-17 MED ORDER — REMIFENTANIL HCL 1 MG IV SOLR
INTRAVENOUS | Status: AC
  Filled 2017-07-17: qty 1000

## 2017-07-17 MED ORDER — BACITRACIN 50000 UNITS IM SOLR
INTRAMUSCULAR | Status: AC
  Filled 2017-07-17: qty 1

## 2017-07-17 MED ORDER — KETAMINE HCL 50 MG/ML IJ SOLN
INTRAMUSCULAR | Status: AC
  Filled 2017-07-17: qty 10

## 2017-07-17 MED ORDER — BUPIVACAINE-EPINEPHRINE (PF) 0.5% -1:200000 IJ SOLN
INTRAMUSCULAR | Status: AC
  Filled 2017-07-17: qty 30

## 2017-07-17 MED ORDER — CEFAZOLIN SODIUM-DEXTROSE 2-4 GM/100ML-% IV SOLN: 2.0000 g | INTRAVENOUS | Status: DC

## 2017-07-17 MED ORDER — LACTATED RINGERS IV SOLN
INTRAVENOUS | Status: DC
  Administered 2017-07-17: 07:00:00 via INTRAVENOUS

## 2017-07-17 MED ORDER — LIDOCAINE HCL (PF) 2 % IJ SOLN
INTRAMUSCULAR | Status: AC
  Filled 2017-07-17: qty 10

## 2017-07-17 MED ORDER — CEFAZOLIN SODIUM-DEXTROSE 2-4 GM/100ML-% IV SOLN
INTRAVENOUS | Status: AC
  Filled 2017-07-17: qty 100

## 2017-07-17 MED ORDER — ROCURONIUM BROMIDE 50 MG/5ML IV SOLN
INTRAVENOUS | Status: AC
  Filled 2017-07-17: qty 1

## 2017-07-17 SURGICAL SUPPLY — 57 items
BLADE BOVIE TIP EXT 4 (BLADE) ×3 IMPLANT
BLADE SURG 15 STRL LF DISP TIS (BLADE) ×1 IMPLANT
BLADE SURG 15 STRL SS (BLADE) ×2
BULB RESERV EVAC DRAIN JP 100C (MISCELLANEOUS) IMPLANT
BUR NEURO DRILL SOFT 3.0X3.8M (BURR) ×3 IMPLANT
CANISTER SUCT 1200ML W/VALVE (MISCELLANEOUS) ×6 IMPLANT
CHLORAPREP W/TINT 26ML (MISCELLANEOUS) ×3 IMPLANT
CLOSURE WOUND 1/2 X4 (GAUZE/BANDAGES/DRESSINGS)
COUNTER NEEDLE 20/40 LG (NEEDLE) ×3 IMPLANT
COVER LIGHT HANDLE STERIS (MISCELLANEOUS) ×6 IMPLANT
CRADLE LAMINECT ARM (MISCELLANEOUS) ×3 IMPLANT
CUP MEDICINE 2OZ PLAST GRAD ST (MISCELLANEOUS) ×6 IMPLANT
DERMABOND ADVANCED (GAUZE/BANDAGES/DRESSINGS) ×2
DERMABOND ADVANCED .7 DNX12 (GAUZE/BANDAGES/DRESSINGS) ×1 IMPLANT
DRAIN CHANNEL JP 10F RND 20C F (MISCELLANEOUS) IMPLANT
DRAPE C-ARM 42X72 X-RAY (DRAPES) ×6 IMPLANT
DRAPE INCISE IOBAN 66X45 STRL (DRAPES) ×3 IMPLANT
DRAPE LAPAROTOMY 77X122 PED (DRAPES) ×3 IMPLANT
DRAPE MICROSCOPE SPINE 48X150 (DRAPES) ×3 IMPLANT
DRAPE POUCH INSTRU U-SHP 10X18 (DRAPES) ×3 IMPLANT
DRAPE SHEET LG 3/4 BI-LAMINATE (DRAPES) ×3 IMPLANT
DRAPE SURG 17X11 SM STRL (DRAPES) ×12 IMPLANT
DRAPE TABLE BACK 80X90 (DRAPES) ×3 IMPLANT
ELECT CAUTERY BLADE TIP 2.5 (TIP) ×3
ELECTRODE CAUTERY BLDE TIP 2.5 (TIP) ×1 IMPLANT
FEE INTRAOP MONITOR IMPULS NCS (MISCELLANEOUS) IMPLANT
FRAME EYE SHIELD (PROTECTIVE WEAR) ×3 IMPLANT
GAUZE SPONGE 4X4 12PLY STRL (GAUZE/BANDAGES/DRESSINGS) IMPLANT
GLOVE SURG SYN 8.5  E (GLOVE) ×6
GLOVE SURG SYN 8.5 E (GLOVE) ×3 IMPLANT
GOWN SRG XL LVL 3 NONREINFORCE (GOWNS) ×1 IMPLANT
GOWN STRL NON-REIN TWL XL LVL3 (GOWNS) ×2
GOWN STRL REUS W/ TWL LRG LVL3 (GOWN DISPOSABLE) ×1 IMPLANT
GOWN STRL REUS W/TWL LRG LVL3 (GOWN DISPOSABLE) ×2
GRADUATE 1200CC STRL 31836 (MISCELLANEOUS) ×3 IMPLANT
INTRAOP MONITOR FEE IMPULS NCS (MISCELLANEOUS)
INTRAOP MONITOR FEE IMPULSE (MISCELLANEOUS)
IV CATH ANGIO 12GX3 LT BLUE (NEEDLE) ×3 IMPLANT
KIT TURNOVER KIT A (KITS) ×3 IMPLANT
MARKER SKIN DUAL TIP RULER LAB (MISCELLANEOUS) ×6 IMPLANT
NEEDLE HYPO 22GX1.5 SAFETY (NEEDLE) ×3 IMPLANT
NS IRRIG 1000ML POUR BTL (IV SOLUTION) ×3 IMPLANT
PACK LAMINECTOMY NEURO (CUSTOM PROCEDURE TRAY) ×3 IMPLANT
PIN CASPAR 14 (PIN) ×1 IMPLANT
PIN CASPAR 14MM (PIN) ×3
SPOGE SURGIFLO 8M (HEMOSTASIS) ×4
SPONGE KITTNER 5P (MISCELLANEOUS) ×6 IMPLANT
SPONGE SURGIFLO 8M (HEMOSTASIS) ×2 IMPLANT
STRIP CLOSURE SKIN 1/2X4 (GAUZE/BANDAGES/DRESSINGS) IMPLANT
SUT V-LOC 90 ABS DVC 3-0 CL (SUTURE) ×3 IMPLANT
SUT VIC AB 3-0 SH 8-18 (SUTURE) ×3 IMPLANT
SYR 30ML LL (SYRINGE) ×3 IMPLANT
TAPE CLOTH 3X10 WHT NS LF (GAUZE/BANDAGES/DRESSINGS) ×3 IMPLANT
TOWEL OR 17X26 4PK STRL BLUE (TOWEL DISPOSABLE) ×12 IMPLANT
TRAY FOLEY W/METER SILVER 16FR (SET/KITS/TRAYS/PACK) IMPLANT
TUBING CONNECTING 10 (TUBING) ×2 IMPLANT
TUBING CONNECTING 10' (TUBING) ×1

## 2017-07-17 NOTE — H&P (Signed)
  I have reviewed and confirmed my history and physical from 06/27/2017 with no additions or changes. Plan for ACDf C3-7.  Risks and benefits reviewed.  Heart sounds normal no MRG. Chest Clear to Auscultation Bilaterally.

## 2017-07-17 NOTE — Anesthesia Preprocedure Evaluation (Signed)
Anesthesia Evaluation  Patient identified by MRN, date of birth, ID band Patient awake    Reviewed: Allergy & Precautions, NPO status , Patient's Chart, lab work & pertinent test results  History of Anesthesia Complications Negative for: history of anesthetic complications  Airway Mallampati: III  TM Distance: >3 FB Neck ROM: Full  Mouth opening: Limited Mouth Opening  Dental  (+) Missing, Poor Dentition   Pulmonary neg sleep apnea, COPD,  COPD inhaler, Current Smoker,    breath sounds clear to auscultation- rhonchi (-) wheezing      Cardiovascular hypertension, Pt. on medications (-) CAD, (-) Past MI, (-) Cardiac Stents and (-) CABG  Rhythm:Regular Rate:Normal - Systolic murmurs and - Diastolic murmurs    Neuro/Psych  Headaches, PSYCHIATRIC DISORDERS Anxiety Depression Bipolar Disorder TIA   GI/Hepatic Neg liver ROS, GERD  ,  Endo/Other  negative endocrine ROSneg diabetes  Renal/GU Renal InsufficiencyRenal disease     Musculoskeletal  (+) Arthritis , Fibromyalgia -  Abdominal (+) - obese,   Peds  Hematology  (+) anemia ,   Anesthesia Other Findings Past Medical History: No date: Abnormal gait No date: Allergy No date: Anxiety No date: Arthritis No date: Back pain No date: CAD (coronary artery disease) No date: Chronic kidney disease No date: Collagen vascular disease (HCC) No date: Colon polyp No date: COPD (chronic obstructive pulmonary disease) (HCC) No date: Falls No date: Fibromyalgia No date: GERD (gastroesophageal reflux disease) No date: Hyperlipidemia No date: Hypertension No date: IBS (irritable bowel syndrome) No date: Kidney disease No date: Migraine No date: Numbness and tingling     Comment:  hand and legs No date: Renal insufficiency No date: Spinal stenosis 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 No date: TIA (transient ischemic attack)   Reproductive/Obstetrics                              Anesthesia Physical Anesthesia Plan  ASA: III  Anesthesia Plan: General   Post-op Pain Management:    Induction: Intravenous  PONV Risk Score and Plan: 1 and TIVA  Airway Management Planned: Oral ETT  Additional Equipment:   Intra-op Plan:   Post-operative Plan: Extubation in OR  Informed Consent: I have reviewed the patients History and Physical, chart, labs and discussed the procedure including the risks, benefits and alternatives for the proposed anesthesia with the patient or authorized representative who has indicated his/her understanding and acceptance.   Dental advisory given  Plan Discussed with: CRNA and Anesthesiologist  Anesthesia Plan Comments:         Anesthesia Quick Evaluation

## 2017-07-17 NOTE — Progress Notes (Signed)
Due to positive drug screen for cocaine, surgery today is cancelled due to increased anesthetic risk.  I have notified the patient.  We will work on rescheduling surgery, and I have advised her that she will have to remain abstinent from cocaine and have a negative test 1 week prior to surgery and on DOS to proceed.  She expressed understanding.

## 2017-08-06 ENCOUNTER — Encounter: Payer: Self-pay | Admitting: *Deleted

## 2017-08-07 ENCOUNTER — Inpatient Hospital Stay
Admission: RE | Admit: 2017-08-07 | Discharge: 2017-08-07 | DRG: 093 | Disposition: A | Discharge status: Home / Self Care | Payer: Medicare Other | Source: Ambulatory Visit | Attending: Neurosurgery | Admitting: Neurosurgery

## 2017-08-07 ENCOUNTER — Encounter: Payer: Self-pay | Admitting: Certified Registered"

## 2017-08-07 ENCOUNTER — Encounter
Admission: RE | Disposition: A | Discharge status: Home / Self Care | Payer: Self-pay | Source: Ambulatory Visit | Attending: Neurosurgery

## 2017-08-07 DIAGNOSIS — Z419 Encounter for procedure for purposes other than remedying health state, unspecified: Secondary | ICD-10-CM

## 2017-08-07 DIAGNOSIS — Z5309 Procedure and treatment not carried out because of other contraindication: Secondary | ICD-10-CM | POA: Diagnosis not present

## 2017-08-07 DIAGNOSIS — G959 Disease of spinal cord, unspecified: Secondary | ICD-10-CM | POA: Diagnosis present

## 2017-08-07 HISTORY — DX: Bipolar disorder, unspecified: F31.9

## 2017-08-07 HISTORY — DX: Nontoxic single thyroid nodule: E04.1

## 2017-08-07 LAB — URINE DRUG SCREEN, QUALITATIVE (ARMC ONLY)
AMPHETAMINES, UR SCREEN: NOT DETECTED
BENZODIAZEPINE, UR SCRN: POSITIVE — AB
Barbiturates, Ur Screen: NOT DETECTED
Cannabinoid 50 Ng, Ur ~~LOC~~: NOT DETECTED
Cocaine Metabolite,Ur ~~LOC~~: POSITIVE — AB
MDMA (Ecstasy)Ur Screen: NOT DETECTED
METHADONE SCREEN, URINE: NOT DETECTED
Opiate, Ur Screen: NOT DETECTED
Phencyclidine (PCP) Ur S: NOT DETECTED
TRICYCLIC, UR SCREEN: POSITIVE — AB

## 2017-08-07 SURGERY — ANTERIOR CERVICAL DECOMPRESSION/DISCECTOMY FUSION 4 LEVELS
Anesthesia: Choice

## 2017-08-07 MED ORDER — CHLORHEXIDINE GLUCONATE CLOTH 2 % EX PADS: 6.0000 | MEDICATED_PAD | Freq: Every day | CUTANEOUS | Status: DC

## 2017-08-07 MED ORDER — LACTATED RINGERS IV SOLN: INTRAVENOUS | Status: DC

## 2017-08-07 MED ORDER — HYDROMORPHONE HCL 1 MG/ML IJ SOLN
INTRAMUSCULAR | Status: AC
  Filled 2017-08-07: qty 1

## 2017-08-07 MED ORDER — FAMOTIDINE 20 MG PO TABS: 20.0000 mg | ORAL_TABLET | Freq: Once | ORAL | Status: DC

## 2017-08-07 MED ORDER — PROPOFOL 10 MG/ML IV BOLUS
INTRAVENOUS | Status: AC
  Filled 2017-08-07: qty 40

## 2017-08-07 MED ORDER — MIDAZOLAM HCL 2 MG/2ML IJ SOLN
INTRAMUSCULAR | Status: AC
  Filled 2017-08-07: qty 4

## 2017-08-07 MED ORDER — CEFAZOLIN SODIUM-DEXTROSE 2-4 GM/100ML-% IV SOLN: 2.0000 g | Freq: Once | INTRAVENOUS | Status: DC

## 2017-08-07 SURGICAL SUPPLY — 57 items
BLADE BOVIE TIP EXT 4 (BLADE) ×3 IMPLANT
BLADE SURG 15 STRL LF DISP TIS (BLADE) ×1 IMPLANT
BLADE SURG 15 STRL SS (BLADE) ×2
BULB RESERV EVAC DRAIN JP 100C (MISCELLANEOUS) IMPLANT
BUR NEURO DRILL SOFT 3.0X3.8M (BURR) ×3 IMPLANT
CANISTER SUCT 1200ML W/VALVE (MISCELLANEOUS) ×6 IMPLANT
CHLORAPREP W/TINT 26ML (MISCELLANEOUS) ×3 IMPLANT
CLOSURE WOUND 1/2 X4 (GAUZE/BANDAGES/DRESSINGS)
COUNTER NEEDLE 20/40 LG (NEEDLE) ×3 IMPLANT
COVER LIGHT HANDLE STERIS (MISCELLANEOUS) ×6 IMPLANT
CRADLE LAMINECT ARM (MISCELLANEOUS) ×3 IMPLANT
CUP MEDICINE 2OZ PLAST GRAD ST (MISCELLANEOUS) ×6 IMPLANT
DERMABOND ADVANCED (GAUZE/BANDAGES/DRESSINGS) ×2
DERMABOND ADVANCED .7 DNX12 (GAUZE/BANDAGES/DRESSINGS) ×1 IMPLANT
DRAIN CHANNEL JP 10F RND 20C F (MISCELLANEOUS) IMPLANT
DRAPE C-ARM 42X72 X-RAY (DRAPES) ×6 IMPLANT
DRAPE INCISE IOBAN 66X45 STRL (DRAPES) ×3 IMPLANT
DRAPE LAPAROTOMY 77X122 PED (DRAPES) ×3 IMPLANT
DRAPE MICROSCOPE SPINE 48X150 (DRAPES) ×3 IMPLANT
DRAPE POUCH INSTRU U-SHP 10X18 (DRAPES) ×3 IMPLANT
DRAPE SHEET LG 3/4 BI-LAMINATE (DRAPES) ×3 IMPLANT
DRAPE SURG 17X11 SM STRL (DRAPES) ×12 IMPLANT
DRAPE TABLE BACK 80X90 (DRAPES) ×3 IMPLANT
ELECT CAUTERY BLADE TIP 2.5 (TIP) ×3
ELECTRODE CAUTERY BLDE TIP 2.5 (TIP) ×1 IMPLANT
FEE INTRAOP MONITOR IMPULS NCS (MISCELLANEOUS) IMPLANT
FRAME EYE SHIELD (PROTECTIVE WEAR) ×3 IMPLANT
GAUZE SPONGE 4X4 12PLY STRL (GAUZE/BANDAGES/DRESSINGS) IMPLANT
GLOVE SURG SYN 8.5  E (GLOVE) ×6
GLOVE SURG SYN 8.5 E (GLOVE) ×3 IMPLANT
GOWN SRG XL LVL 3 NONREINFORCE (GOWNS) ×1 IMPLANT
GOWN STRL NON-REIN TWL XL LVL3 (GOWNS) ×2
GOWN STRL REUS W/ TWL LRG LVL3 (GOWN DISPOSABLE) ×1 IMPLANT
GOWN STRL REUS W/TWL LRG LVL3 (GOWN DISPOSABLE) ×2
GRADUATE 1200CC STRL 31836 (MISCELLANEOUS) ×3 IMPLANT
INTRAOP MONITOR FEE IMPULS NCS (MISCELLANEOUS)
INTRAOP MONITOR FEE IMPULSE (MISCELLANEOUS)
IV CATH ANGIO 12GX3 LT BLUE (NEEDLE) ×3 IMPLANT
KIT TURNOVER KIT A (KITS) ×3 IMPLANT
MARKER SKIN DUAL TIP RULER LAB (MISCELLANEOUS) ×6 IMPLANT
NEEDLE HYPO 22GX1.5 SAFETY (NEEDLE) ×3 IMPLANT
NS IRRIG 1000ML POUR BTL (IV SOLUTION) ×3 IMPLANT
PACK LAMINECTOMY NEURO (CUSTOM PROCEDURE TRAY) ×3 IMPLANT
PIN CASPAR 14 (PIN) ×1 IMPLANT
PIN CASPAR 14MM (PIN) ×3
SPOGE SURGIFLO 8M (HEMOSTASIS) ×4
SPONGE KITTNER 5P (MISCELLANEOUS) ×6 IMPLANT
SPONGE SURGIFLO 8M (HEMOSTASIS) ×2 IMPLANT
STRIP CLOSURE SKIN 1/2X4 (GAUZE/BANDAGES/DRESSINGS) IMPLANT
SUT V-LOC 90 ABS DVC 3-0 CL (SUTURE) ×3 IMPLANT
SUT VIC AB 3-0 SH 8-18 (SUTURE) ×3 IMPLANT
SYR 30ML LL (SYRINGE) ×3 IMPLANT
TAPE CLOTH 3X10 WHT NS LF (GAUZE/BANDAGES/DRESSINGS) ×3 IMPLANT
TOWEL OR 17X26 4PK STRL BLUE (TOWEL DISPOSABLE) ×12 IMPLANT
TRAY FOLEY W/METER SILVER 16FR (SET/KITS/TRAYS/PACK) IMPLANT
TUBING CONNECTING 10 (TUBING) ×2 IMPLANT
TUBING CONNECTING 10' (TUBING) ×1

## 2017-08-07 NOTE — OR Nursing (Signed)
Patient tested positive for cocaine. Per Anesthesia Dr Andree Elk patient is cancelled and will have to contact surgeons office to reschedule surgery. Patient notified surgery cancelled and to contact Dr Newman Pies office to reschedule surgery.

## 2017-08-07 NOTE — OR Nursing (Signed)
Pt. Urine drug screen positive for cocaine. Dr. Andree Elk aware and by sent home. Pt. Instructed to call Dr. Cari Caraway office for rescheduling and informed to stop drug use several weeks prior to surgery.

## 2017-08-15 MED ORDER — CEFAZOLIN SODIUM-DEXTROSE 1-4 GM/50ML-% IV SOLN
INTRAVENOUS | Status: AC
  Filled 2017-08-15: qty 50

## 2017-08-26 MED ORDER — CEFAZOLIN SODIUM-DEXTROSE 1-4 GM/50ML-% IV SOLN
INTRAVENOUS | Status: AC
  Filled 2017-08-26: qty 50

## 2017-08-26 MED ORDER — SODIUM CHLORIDE FLUSH 0.9 % IV SOLN
INTRAVENOUS | Status: AC
  Filled 2017-08-26: qty 10

## 2017-12-24 ENCOUNTER — Emergency Department: Payer: Medicare Other

## 2017-12-24 ENCOUNTER — Emergency Department
Admission: EM | Admit: 2017-12-24 | Discharge: 2017-12-24 | Disposition: A | Discharge status: Home / Self Care | Payer: Medicare Other | Attending: Emergency Medicine | Admitting: Emergency Medicine

## 2017-12-24 ENCOUNTER — Encounter: Payer: Self-pay | Admitting: Emergency Medicine

## 2017-12-24 ENCOUNTER — Other Ambulatory Visit: Payer: Self-pay

## 2017-12-24 DIAGNOSIS — R1031 Right lower quadrant pain: Secondary | ICD-10-CM | POA: Insufficient documentation

## 2017-12-24 DIAGNOSIS — Z8673 Personal history of transient ischemic attack (TIA), and cerebral infarction without residual deficits: Secondary | ICD-10-CM | POA: Diagnosis not present

## 2017-12-24 DIAGNOSIS — M7918 Myalgia, other site: Secondary | ICD-10-CM | POA: Insufficient documentation

## 2017-12-24 DIAGNOSIS — R1084 Generalized abdominal pain: Secondary | ICD-10-CM | POA: Diagnosis not present

## 2017-12-24 DIAGNOSIS — F17210 Nicotine dependence, cigarettes, uncomplicated: Secondary | ICD-10-CM | POA: Diagnosis not present

## 2017-12-24 DIAGNOSIS — I129 Hypertensive chronic kidney disease with stage 1 through stage 4 chronic kidney disease, or unspecified chronic kidney disease: Secondary | ICD-10-CM | POA: Insufficient documentation

## 2017-12-24 DIAGNOSIS — Z7982 Long term (current) use of aspirin: Secondary | ICD-10-CM | POA: Insufficient documentation

## 2017-12-24 DIAGNOSIS — N183 Chronic kidney disease, stage 3 (moderate): Secondary | ICD-10-CM | POA: Insufficient documentation

## 2017-12-24 DIAGNOSIS — Z79899 Other long term (current) drug therapy: Secondary | ICD-10-CM | POA: Diagnosis not present

## 2017-12-24 DIAGNOSIS — I2510 Atherosclerotic heart disease of native coronary artery without angina pectoris: Secondary | ICD-10-CM | POA: Diagnosis not present

## 2017-12-24 DIAGNOSIS — R110 Nausea: Secondary | ICD-10-CM | POA: Insufficient documentation

## 2017-12-24 DIAGNOSIS — F1410 Cocaine abuse, uncomplicated: Secondary | ICD-10-CM | POA: Diagnosis not present

## 2017-12-24 DIAGNOSIS — R638 Other symptoms and signs concerning food and fluid intake: Secondary | ICD-10-CM | POA: Insufficient documentation

## 2017-12-24 DIAGNOSIS — M2550 Pain in unspecified joint: Secondary | ICD-10-CM | POA: Insufficient documentation

## 2017-12-24 LAB — COMPREHENSIVE METABOLIC PANEL
ALBUMIN: 3.7 g/dL (ref 3.5–5.0)
ALK PHOS: 96 U/L (ref 38–126)
ALT: 15 U/L (ref 0–44)
AST: 17 U/L (ref 15–41)
Anion gap: 9 (ref 5–15)
BILIRUBIN TOTAL: 0.7 mg/dL (ref 0.3–1.2)
BUN: 23 mg/dL — AB (ref 6–20)
CO2: 25 mmol/L (ref 22–32)
CREATININE: 1.15 mg/dL — AB (ref 0.44–1.00)
Calcium: 8.6 mg/dL — ABNORMAL LOW (ref 8.9–10.3)
Chloride: 105 mmol/L (ref 98–111)
GFR calc Af Amer: >60 mL/min (ref >60)
GFR calc non Af Amer: 53 mL/min — ABNORMAL LOW (ref >60)
GLUCOSE: 96 mg/dL (ref 70–99)
POTASSIUM: 3.3 mmol/L — AB (ref 3.5–5.1)
Sodium: 139 mmol/L (ref 135–145)
TOTAL PROTEIN: 7.1 g/dL (ref 6.5–8.1)

## 2017-12-24 LAB — URINALYSIS, COMPLETE (UACMP) WITH MICROSCOPIC
Bacteria, UA: NONE SEEN
Bilirubin Urine: NEGATIVE
Glucose, UA: NEGATIVE mg/dL
Hgb urine dipstick: NEGATIVE
Ketones, ur: NEGATIVE mg/dL
Leukocytes, UA: NEGATIVE
NITRITE: NEGATIVE
PH: 6 (ref 5.0–8.0)
Protein, ur: NEGATIVE mg/dL
SPECIFIC GRAVITY, URINE: 1.02 (ref 1.005–1.030)

## 2017-12-24 LAB — CBC
HEMATOCRIT: 38.5 % (ref 35.0–47.0)
Hemoglobin: 13.4 g/dL (ref 12.0–16.0)
MCH: 31.1 pg (ref 26.0–34.0)
MCHC: 34.8 g/dL (ref 32.0–36.0)
MCV: 89.4 fL (ref 80.0–100.0)
PLATELETS: 224 10*3/uL (ref 150–440)
RBC: 4.31 MIL/uL (ref 3.80–5.20)
RDW: 13.2 % (ref 11.5–14.5)
WBC: 6.9 10*3/uL (ref 3.6–11.0)

## 2017-12-24 LAB — LIPASE, BLOOD: Lipase: 37 U/L (ref 11–51)

## 2017-12-24 MED ORDER — HYDROXYCHLOROQUINE SULFATE 200 MG PO TABS: 200.0000 mg | ORAL_TABLET | Freq: Every day | ORAL | 0 refills | Status: AC

## 2017-12-24 MED ORDER — PREDNISONE 10 MG PO TABS: ORAL_TABLET | ORAL | 0 refills | Status: AC

## 2017-12-24 MED ORDER — IOHEXOL 300 MG/ML  SOLN
100.0000 mL | Freq: Once | INTRAMUSCULAR | Status: AC | PRN
  Administered 2017-12-24: 100 mL via INTRAVENOUS
  Filled 2017-12-24: qty 100

## 2017-12-24 MED ORDER — HYDROXYCHLOROQUINE SULFATE 200 MG PO TABS: 200.0000 mg | ORAL_TABLET | Freq: Every day | ORAL | 0 refills | Status: DC

## 2017-12-24 MED ORDER — FENTANYL CITRATE (PF) 100 MCG/2ML IJ SOLN
50.0000 ug | Freq: Once | INTRAMUSCULAR | Status: AC
  Administered 2017-12-24: 50 ug via INTRAVENOUS
  Filled 2017-12-24: qty 2

## 2017-12-24 MED ORDER — ONDANSETRON HCL 4 MG/2ML IJ SOLN
4.0000 mg | Freq: Once | INTRAMUSCULAR | Status: AC
  Administered 2017-12-24: 4 mg via INTRAVENOUS
  Filled 2017-12-24: qty 2

## 2017-12-24 MED ORDER — SODIUM CHLORIDE 0.9 % IV BOLUS
1000.0000 mL | Freq: Once | INTRAVENOUS | Status: AC
  Administered 2017-12-24: 1000 mL via INTRAVENOUS

## 2017-12-24 MED ORDER — PREDNISONE 10 MG PO TABS: ORAL_TABLET | ORAL | 0 refills | Status: DC

## 2017-12-24 NOTE — ED Triage Notes (Signed)
Patient reports that she was sent by PCP due to abnormal labs. States her kidney function is elevated and she has had low blood pressure for several days. Patient also complaining of RLQ abd pain. Reports history of stage 3 kidney disease. Per patient she was sent for IV fluids and abdominal CT.

## 2017-12-24 NOTE — ED Notes (Signed)
Patient transported to CT 

## 2017-12-24 NOTE — Discharge Instructions (Signed)
Your labs and CT scan today were unremarkable.  Take Plaquenil and prednisone as prescribed for flareup of your underlying lupus/inflammatory condition.  Please follow-up with your doctor this week for continued monitoring of your symptoms.

## 2017-12-24 NOTE — ED Notes (Signed)
Pt c/o having N/V Saturday and Sunday and having bodyaches and was seen by PCP yesterday and referred to the ED today due to abnormal labs. Pt states she thinks this is related to lupus

## 2017-12-24 NOTE — ED Provider Notes (Signed)
[facility] Emergency Department Provider Note  ____________________________________________  Time seen: Approximately 5:28 PM  I have reviewed the triage vital signs and the nursing notes.   HISTORY  Chief Complaint Abdominal Pain    HPI Samantha Macias is a 57 y.o. female with a history of COPD, hypertension, irritable bowel syndrome, fibromyalgia, and reported history of lupus who complains of generalized abdominal pain body aches and joint pains for the past 5 days.  She is felt nauseated without significant vomiting or diarrhea but has had decreased oral intake including food and fluids.  No dizziness or lightheadedness.  No chest pain shortness of breath fevers chills or sweats.  Abdominal pain is worse in the right lower quadrant, nonradiating, worse with movement, no alleviating factors.  Moderate intensity, constant and waxing and waning.     Past Medical History:  Diagnosis Date  . Abnormal gait   . Allergy   . Anxiety   . Arthritis   . Back pain   . Bipolar disorder ([place])   . CAD (coronary artery disease)   . Chronic kidney disease   . Collagen vascular disease ([place])   . Colon polyp   . COPD (chronic obstructive pulmonary disease) ([place])   . Falls   . Fibromyalgia   . GERD (gastroesophageal reflux disease)    erosive gastritis per EGD 07/2017  . Hyperlipidemia   . Hypertension   . IBS (irritable bowel syndrome)   . Kidney disease   . Migraine   . Numbness and tingling    hand and legs  . Renal insufficiency   . Spinal stenosis   . Static encephalopathy   . Thyroid nodule 07/2017   several nodules being followed by Dr. Gabriel Carina  . TIA (transient ischemic attack)      Patient Active Problem List   Diagnosis Date Noted  . Vitamin D deficiency 03/28/2016  . HLD (hyperlipidemia) 03/28/2016  . Anxiety 03/28/2016  . 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 Arthralgia of multiple joints 02/17/2016  . Thyromegaly 08/18/2015  .  Thyroid cyst 08/18/2015  . Osteoarthritis of both hands 08/01/2015  . Hypertension, benign 07/01/2015  . 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 Allergic rhinitis 12/01/2014  . Anemia in chronic illness 12/01/2014  . GAD (generalized anxiety disorder) 12/01/2014  . Back pain, chronic 12/01/2014  . Bipolar affective disorder, currently depressed, mild ([place]) 12/01/2014  . Atherosclerosis of left carotid artery 12/01/2014  . Carpal tunnel syndrome 12/01/2014  . Chronic kidney disease (CKD), stage III (moderate) ([place]) 12/01/2014  . Insomnia, persistent 12/01/2014  . Chronic pain syndrome 12/01/2014  . CN (constipation) 12/01/2014  . Kidney cysts 12/01/2014  . Cervical osteoarthritis 12/01/2014  . Dyslipidemia 12/01/2014  . Fibromyalgia syndrome 12/01/2014  . Abnormal gait 12/01/2014  . Gastro-esophageal reflux disease without esophagitis 12/01/2014  . History of colon polyps 12/01/2014  . History of syncope 12/01/2014  . H/O transient cerebral ischemia 12/01/2014  . HPV test positive 12/01/2014  . Recurrent falls 12/01/2014  . Calculus of kidney 12/01/2014  . Lumbar radiculopathy 12/01/2014  . OCD (obsessive compulsive disorder) 12/01/2014  . Overweight 12/01/2014  . PTSD (post-traumatic stress disorder) 12/01/2014  . Trochanteric bursitis of both hips 12/01/2014  . 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 COLONOSCOPY WITH PROPOFOL N/A 04/01/2017   Procedure: COLONOSCOPY WITH PROPOFOL;  Surgeon: Lollie Sails, MD;  Location: [facility] ENDOSCOPY;  Service: Endoscopy;  Laterality: N/A;  .  ESOPHAGOGASTRODUODENOSCOPY (EGD) WITH PROPOFOL N/A 04/01/2017   Procedure: ESOPHAGOGASTRODUODENOSCOPY (EGD) WITH PROPOFOL;  Surgeon: Lollie Sails, MD;  Location: [facility] ENDOSCOPY;  Service: Endoscopy;  Laterality: N/A;  . MOUTH SURGERY    . NASAL SINUS SURGERY    . TUBAL LIGATION       Prior to Admission medications    Medication Sig Start Date End Date Taking? Authorizing Provider  albuterol (PROVENTIL HFA;VENTOLIN HFA) 108 (90 Base) MCG/ACT inhaler Inhale 2 puffs into the lungs every 6 (six) hours as needed for wheezing or shortness of breath.    [provider]  ALPRAZolam Duanne Moron) 0.5 MG tablet Take 0.5-0.75 mg by mouth at bedtime as needed for anxiety.    [provider]  aspirin EC 81 MG tablet Take 1 tablet (81 mg total) by mouth at bedtime. 12/01/15   Steele Sizer, MD  buPROPion (WELLBUTRIN XL) 300 MG 24 hr tablet Take 300 mg by mouth daily. 05/15/17   [provider]  Cholecalciferol (VITAMIN D) 2000 units tablet Take 2,000 Units by mouth daily.    [provider]  diclofenac sodium (VOLTAREN) 1 % GEL Apply 4 g topically 4 (four) times daily as needed. Patient taking differently: Apply 4 g topically 4 (four) times daily as needed. For pain 03/27/16   Olin Hauser, DO  donepezil (ARICEPT) 10 MG tablet Take 10 mg by mouth at bedtime.    [provider]  formoterol (PERFOROMIST) 20 MCG/2ML nebulizer solution Take 20 mcg by nebulization 2 (two) times daily.    [provider]  hydroxychloroquine (PLAQUENIL) 200 MG tablet Take 1 tablet (200 mg total) by mouth daily for 15 days. 12/24/17 01/08/18  Carrie Mew, MD  ibuprofen (ADVIL,MOTRIN) 600 MG tablet Take 1 tablet (600 mg total) by mouth every 6 (six) hours as needed. Patient not taking: Reported on 07/10/2017 11/28/15   Sable Feil, PA-C  ipratropium (ATROVENT) 0.02 % nebulizer solution Take 0.5 mg by nebulization 3 (three) times daily.    [provider]  losartan-hydrochlorothiazide (HYZAAR) 100-12.5 MG tablet Take 1 tablet by mouth daily. 12/01/15   Steele Sizer, MD  mirtazapine (REMERON) 15 MG tablet Take 15 mg by mouth at bedtime. 07/26/17   [provider]  nicotine (NICODERM CQ - DOSED IN MG/24 HOURS) 14 mg/24hr patch Place 14 mg onto the skin daily.     [provider]  omeprazole (PRILOSEC) 20 MG capsule Take 20 mg by mouth daily.    [provider]  pantoprazole (PROTONIX) 40 MG tablet Take 40 mg by mouth daily. 07/15/17   [provider]  predniSONE (DELTASONE) 10 MG tablet Take 2 tablets (20 mg total) by mouth daily for 5 days, THEN 1 tablet (10 mg total) daily for 5 days. 12/24/17 01/03/18  Carrie Mew, MD  pregabalin (LYRICA) 200 MG capsule Take 1 capsule (200 mg total) by mouth 3 (three) times daily. 06/13/16   Drenda Freeze, MD  QUEtiapine (SEROQUEL) 100 MG tablet Take 150 mg by mouth at bedtime. 07/15/17   [provider]  ramelteon (ROZEREM) 8 MG tablet Take 1 tablet (8 mg total) by mouth at bedtime. 03/27/16   Karamalegos, Devonne Doughty, DO  sertraline (ZOLOFT) 50 MG tablet Take 50 mg by mouth daily. 06/17/17   [provider]  SPIRIVA HANDIHALER 18 MCG inhalation capsule Place 1 puff into inhaler and inhale daily. 05/23/17   [provider]  tiZANidine (ZANAFLEX) 2 MG tablet Take 1 tablet (2 mg total) by mouth every  6 (six) hours as needed for muscle spasms. Patient taking differently: Take 4 mg by mouth every 6 (six) hours as needed for muscle spasms.  06/13/16   Drenda Freeze, MD  traMADol (ULTRAM) 50 MG tablet Take 1 tablet (50 mg total) by mouth every 8 (eight) hours as needed. Patient taking differently: Take 50 mg by mouth 3 (three) times daily as needed for moderate pain.  12/01/15   Steele Sizer, MD     Allergies Shellfish allergy; Tetanus toxoid adsorbed; Ace inhibitors; Codeine; Nitrofurantoin monohyd macro; and Red dye   Family History  Problem Relation Age of Onset  . Congestive Heart Failure Father   . Alcohol abuse Father   . Heart disease Father   . Heart attack Father   . Peripheral vascular disease Mother   . Anxiety disorder Mother   . Depression Mother   . Thyroid disease Mother   . Heart disease Mother   . Mental illness Mother   . Bipolar  disorder Mother     Social History Social History   Tobacco Use  . Smoking status: Current Every Day Smoker    Packs/day: 0.25    Years: 25.00    Pack years: 6.25    Types: Cigarettes  . Smokeless tobacco: Never Used  Substance Use Topics  . Alcohol use: No    Alcohol/week: 0.0 oz  . Drug use: Yes    Types: Cocaine    Comment: negative UDS on 07/31/2017    Review of Systems  Constitutional:   No fever or chills.  ENT:   No sore throat. No rhinorrhea. Cardiovascular:   No chest pain or syncope. Respiratory:   No dyspnea or cough. Gastrointestinal:   Positive as above for generalized abdominal pain without vomiting and diarrhea.  Musculoskeletal: Positive for generalized body aches and arthralgia All other systems reviewed and are negative except as documented above in ROS and HPI.  ____________________________________________   PHYSICAL EXAM:  VITAL SIGNS: ED Triage Vitals  Enc Vitals Group     BP 12/24/17 1322 124/86     Pulse Rate 12/24/17 1322 79     Resp 12/24/17 1322 18     Temp 12/24/17 1322 98.4 F (36.9 C)     Temp Source 12/24/17 1322 Oral     SpO2 12/24/17 1322 97 %     Weight 12/24/17 1323 200 lb (90.7 kg)     Height 12/24/17 1323 5\' 8"  (1.727 m)     Head Circumference --      Peak Flow --      Pain Score 12/24/17 1323 7     Pain Loc --      Pain Edu? --      Excl. in [place]? --     Vital signs reviewed, nursing assessments reviewed.   Constitutional:   Alert and oriented. Non-toxic appearance. Eyes:   Conjunctivae are normal. EOMI. PERRL. ENT      Head:   Normocephalic and atraumatic.      Nose:   No congestion/rhinnorhea.       Mouth/Throat:   MMM, no pharyngeal erythema. No peritonsillar mass.       Neck:   No meningismus. Full ROM. Hematological/Lymphatic/Immunilogical:   No cervical lymphadenopathy. Cardiovascular:   RRR. Symmetric bilateral radial and DP pulses.  No murmurs. Cap refill less than 2 seconds. Respiratory:   Normal  respiratory effort without tachypnea/retractions. Breath sounds are clear and equal bilaterally. No wheezes/rales/rhonchi. Gastrointestinal:   Soft with generalized tenderness, slightly  worse in the right lower quadrant but not at McBurney's point.. Non distended. There is no CVA tenderness.  No rebound, rigidity, or guarding. Musculoskeletal:   Normal range of motion in all extremities. No joint effusions.  No lower extremity tenderness.  No edema. Neurologic:   Normal speech and language.  Motor grossly intact. No acute focal neurologic deficits are appreciated.  Skin:    Skin is warm, dry and intact. No rash noted.  No petechiae, purpura, or bullae.  ____________________________________________    LABS (pertinent positives/negatives) (all labs ordered are listed, but only abnormal results are displayed) Labs Reviewed  COMPREHENSIVE METABOLIC PANEL - Abnormal; Notable for the following components:      Result Value   Potassium 3.3 (*)    BUN 23 (*)    Creatinine, Ser 1.15 (*)    Calcium 8.6 (*)    GFR calc non Af Amer 53 (*)    All other components within normal limits  LIPASE, BLOOD  CBC  URINALYSIS, COMPLETE (UACMP) WITH MICROSCOPIC   ____________________________________________   EKG    ____________________________________________    RADIOLOGY  Ct Abdomen Pelvis W Contrast  Result Date: 12/24/2017 CLINICAL DATA:  57 year old female with a history of abnormal kidney function and low blood pressure. EXAM: CT ABDOMEN AND PELVIS WITH CONTRAST TECHNIQUE: Multidetector CT imaging of the abdomen and pelvis was performed using the standard protocol following bolus administration of intravenous contrast. CONTRAST:  168mL OMNIPAQUE IOHEXOL 300 MG/ML  SOLN COMPARISON:  12/22/2013 FINDINGS: Lower chest: Atelectasis/scarring at the right lung base respiratory motion somewhat limits evaluation the lung bases. No acute finding. Hepatobiliary: Low-density cystic structure of the right  liver without enhancement measures 2 cm. This is unchanged from the comparison CT. Low-density cystic structure at the gallbladder fossa measures 2.1 cm, relatively unchanged. Unremarkable appearance the gallbladder. Pancreas: Unremarkable pancreas Spleen: Unremarkable spleen Adrenals/Urinary Tract: Unremarkable adrenal glands. No evidence of hydronephrosis of the left or right kidney. No perinephric stranding. Symmetric perfusion of the kidneys. Low-density lesion from the inferior cortex of the left kidney demonstrates Hounsfield units on the arterial phase and delayed phase in the fluid range with no evidence of enhancement, compatible with Bosniak 1 cyst. Unremarkable course the bilateral ureters. Unremarkable appearance of the urinary bladder. Stomach/Bowel: Unremarkable appearance of the stomach. Unremarkable small bowel with no transition point. No focal wall thickening or inflammatory changes. Normal appendix. Mild stool burden without transition point of the colon. Diverticula without associated inflammation. Vascular/Lymphatic: Minimal atherosclerotic calcifications of the abdominal aorta. Bilateral iliac arteries and common femoral arteries are patent. Reproductive: Unremarkable uterus and adnexa Other: No abdominal wall hernia. Musculoskeletal: No acute displaced fracture. Degenerative changes of the lumbar spine with vacuum disc phenomenon at L5-S1. No bony canal narrowing. IMPRESSION: No acute CT finding to account for abdominal pain. Diverticular disease without evidence of acute diverticulitis. Electronically Signed   By: Corrie Mckusick D.O.   On: 12/24/2017 16:26    ____________________________________________   PROCEDURES Procedures  ____________________________________________  DIFFERENTIAL DIAGNOSIS   Appendicitis, bowel obstruction, diverticulitis, lupus flare  CLINICAL IMPRESSION / ASSESSMENT AND PLAN / ED COURSE  Pertinent labs & imaging results that were available during my  care of the patient were reviewed by me and considered in my medical decision making (see chart for details).      Clinical Course as of Dec 25 1726  Tue Dec 24, 2017  1512 Gen abd pain, worse in rlq. Concerning for appendicitis vs lupus flare. Since SLE tx would require prednisone,  which would be immunosuppressive and worsen intra-abd infectious process, will obtain CT a/p to r/o appy. If CT reassuring, will tx with prednisone and plaquinil as she usually does with similar sx.    [PS]    Clinical Course User Index [PS] Carrie Mew, MD    ----------------------------------------- 5:33 PM on 12/24/2017 -----------------------------------------  CT reassuring, negative.  Labs unremarkable.  Vital signs unremarkable.  Treat with Plaquenil 200 mg daily, prednisone 20 mg tapering to 10 mg, 10-day course.  Follow-up with your doctor this week.   ____________________________________________   FINAL CLINICAL IMPRESSION(S) / ED DIAGNOSES    Final diagnoses:  Generalized abdominal pain  Arthralgia, unspecified joint     ED Discharge Orders        Ordered    hydroxychloroquine (PLAQUENIL) 200 MG tablet  Daily     12/24/17 1727    predniSONE (DELTASONE) 10 MG tablet     12/24/17 1727      Portions of this note were generated with dragon dictation software. Dictation errors may occur despite best attempts at proofreading.    Carrie Mew, MD 12/24/17 1734

## 2018-02-19 DIAGNOSIS — M25562 Pain in left knee: Secondary | ICD-10-CM | POA: Insufficient documentation

## 2018-02-19 DIAGNOSIS — M25552 Pain in left hip: Secondary | ICD-10-CM | POA: Insufficient documentation

## 2018-02-27 ENCOUNTER — Other Ambulatory Visit: Payer: Self-pay | Admitting: Sports Medicine

## 2018-02-27 DIAGNOSIS — W19XXXA Unspecified fall, initial encounter: Secondary | ICD-10-CM

## 2018-02-27 DIAGNOSIS — M25552 Pain in left hip: Secondary | ICD-10-CM

## 2018-03-07 ENCOUNTER — Ambulatory Visit: Payer: Medicare Other | Attending: Sports Medicine

## 2018-03-10 ENCOUNTER — Ambulatory Visit: Payer: Medicare Other | Admitting: Nurse Practitioner

## 2018-03-12 ENCOUNTER — Ambulatory Visit: Payer: Medicare Other | Admitting: Nurse Practitioner

## 2018-03-17 ENCOUNTER — Other Ambulatory Visit: Payer: Self-pay

## 2018-03-17 ENCOUNTER — Encounter: Payer: Self-pay | Admitting: Nurse Practitioner

## 2018-03-17 ENCOUNTER — Ambulatory Visit: Payer: Medicare Other | Attending: Nurse Practitioner | Admitting: Nurse Practitioner

## 2018-03-17 VITALS — BP 125/101 | HR 92 | Temp 98.0°F | Resp 18 | Ht 68.0 in | Wt 202.0 lb

## 2018-03-17 DIAGNOSIS — F17210 Nicotine dependence, cigarettes, uncomplicated: Secondary | ICD-10-CM | POA: Insufficient documentation

## 2018-03-17 DIAGNOSIS — M5441 Lumbago with sciatica, right side: Secondary | ICD-10-CM

## 2018-03-17 DIAGNOSIS — Z79891 Long term (current) use of opiate analgesic: Secondary | ICD-10-CM

## 2018-03-17 DIAGNOSIS — M79642 Pain in left hand: Secondary | ICD-10-CM | POA: Insufficient documentation

## 2018-03-17 DIAGNOSIS — M5442 Lumbago with sciatica, left side: Secondary | ICD-10-CM | POA: Insufficient documentation

## 2018-03-17 DIAGNOSIS — G894 Chronic pain syndrome: Secondary | ICD-10-CM | POA: Diagnosis present

## 2018-03-17 DIAGNOSIS — J449 Chronic obstructive pulmonary disease, unspecified: Secondary | ICD-10-CM | POA: Insufficient documentation

## 2018-03-17 DIAGNOSIS — G8929 Other chronic pain: Secondary | ICD-10-CM

## 2018-03-17 DIAGNOSIS — N183 Chronic kidney disease, stage 3 (moderate): Secondary | ICD-10-CM | POA: Insufficient documentation

## 2018-03-17 DIAGNOSIS — Z789 Other specified health status: Secondary | ICD-10-CM

## 2018-03-17 DIAGNOSIS — M79605 Pain in left leg: Secondary | ICD-10-CM

## 2018-03-17 DIAGNOSIS — Z79899 Other long term (current) drug therapy: Secondary | ICD-10-CM | POA: Insufficient documentation

## 2018-03-17 DIAGNOSIS — I10 Essential (primary) hypertension: Secondary | ICD-10-CM | POA: Insufficient documentation

## 2018-03-17 DIAGNOSIS — M899 Disorder of bone, unspecified: Secondary | ICD-10-CM | POA: Insufficient documentation

## 2018-03-17 DIAGNOSIS — I6522 Occlusion and stenosis of left carotid artery: Secondary | ICD-10-CM | POA: Diagnosis not present

## 2018-03-17 DIAGNOSIS — I129 Hypertensive chronic kidney disease with stage 1 through stage 4 chronic kidney disease, or unspecified chronic kidney disease: Secondary | ICD-10-CM | POA: Insufficient documentation

## 2018-03-17 DIAGNOSIS — M79604 Pain in right leg: Secondary | ICD-10-CM

## 2018-03-17 DIAGNOSIS — K219 Gastro-esophageal reflux disease without esophagitis: Secondary | ICD-10-CM | POA: Insufficient documentation

## 2018-03-17 DIAGNOSIS — M4712 Other spondylosis with myelopathy, cervical region: Secondary | ICD-10-CM | POA: Diagnosis not present

## 2018-03-17 DIAGNOSIS — R079 Chest pain, unspecified: Secondary | ICD-10-CM | POA: Insufficient documentation

## 2018-03-17 DIAGNOSIS — M25562 Pain in left knee: Secondary | ICD-10-CM | POA: Diagnosis not present

## 2018-03-17 DIAGNOSIS — M79641 Pain in right hand: Secondary | ICD-10-CM | POA: Insufficient documentation

## 2018-03-17 DIAGNOSIS — G5603 Carpal tunnel syndrome, bilateral upper limbs: Secondary | ICD-10-CM | POA: Diagnosis not present

## 2018-03-17 DIAGNOSIS — D631 Anemia in chronic kidney disease: Secondary | ICD-10-CM | POA: Insufficient documentation

## 2018-03-17 DIAGNOSIS — E785 Hyperlipidemia, unspecified: Secondary | ICD-10-CM | POA: Diagnosis not present

## 2018-03-17 DIAGNOSIS — M549 Dorsalgia, unspecified: Secondary | ICD-10-CM

## 2018-03-17 NOTE — Patient Instructions (Addendum)
____________________________________________________________________________________________  Appointment Policy Summary  It is our goal and responsibility to provide the medical community with assistance in the evaluation and management of patients with chronic pain. Unfortunately our resources are limited. Because we do not have an unlimited amount of time, or available appointments, we are required to closely monitor and manage their use. The following rules exist to maximize their use:  Patient's responsibilities: 1. Punctuality:  At what time should I arrive? You should be physically present in our office 30 minutes before your scheduled appointment. Your scheduled appointment is with your assigned healthcare provider. However, it takes 5-10 minutes to be "checked-in", and another 15 minutes for the nurses to do the admission. If you arrive to our office at the time you were given for your appointment, you will end up being at least 20-25 minutes late to your appointment with the provider. 2. Tardiness:  What happens if I arrive only a few minutes after my scheduled appointment time? You will need to reschedule your appointment. The cutoff is your appointment time. This is why it is so important that you arrive at least 30 minutes before that appointment. If you have an appointment scheduled for 10:00 AM and you arrive at 10:01, you will be required to reschedule your appointment.  3. Plan ahead:  Always assume that you will encounter traffic on your way in. Plan for it. If you are dependent on a driver, make sure they understand these rules and the need to arrive early. 4. Other appointments and responsibilities:  Avoid scheduling any other appointments before or after your pain clinic appointments.  5. Be prepared:  Write down everything that you need to discuss with your healthcare provider and give this information to the admitting nurse. Write down the medications that you will need  refilled. Bring your pills and bottles (even the empty ones), to all of your appointments, except for those where a procedure is scheduled. 6. No children or pets:  Find someone to take care of them. It is not appropriate to bring them in. 7. Scheduling changes:  We request "advanced notification" of any changes or cancellations. 8. Advanced notification:  Defined as a time period of more than 24 hours prior to the originally scheduled appointment. This allows for the appointment to be offered to other patients. 9. Rescheduling:  When a visit is rescheduled, it will require the cancellation of the original appointment. For this reason they both fall within the category of "Cancellations".  10. Cancellations:  They require advanced notification. Any cancellation less than 24 hours before the  appointment will be recorded as a "No Show". 11. No Show:  Defined as an unkept appointment where the patient failed to notify or declare to the practice their intention or inability to keep the appointment.  Corrective process for repeat offenders:  1. Tardiness: Three (3) episodes of rescheduling due to late arrivals will be recorded as one (1) "No Show". 2. Cancellation or reschedule: Three (3) cancellations or rescheduling will be recorded as one (1) "No Show". 3. "No Shows": Three (3) "No Shows" within a 12 month period will result in discharge from the practice. ____________________________________________________________________________________________  ____________________________________________________________________________________________  Pain Scale  Introduction: The pain score used by this practice is the Verbal Numerical Rating Scale (VNRS-11). This is an 11-point scale. It is for adults and children 10 years or older. There are significant differences in how the pain score is reported, used, and applied. Forget everything you learned in the past and learn  this scoring system.  General  Information: The scale should reflect your current level of pain. Unless you are specifically asked for the level of your worst pain, or your average pain. If you are asked for one of these two, then it should be understood that it is over the past 24 hours.  Basic Activities of Daily Living (ADL): Personal hygiene, dressing, eating, transferring, and using restroom.  Instructions: Most patients tend to report their level of pain as a combination of two factors, their physical pain and their psychosocial pain. This last one is also known as "suffering" and it is reflection of how physical pain affects you socially and psychologically. From now on, report them separately. From this point on, when asked to report your pain level, report only your physical pain. Use the following table for reference.  Pain Clinic Pain Levels (0-5/10)  Pain Level Score  Description  No Pain 0   Mild pain 1 Nagging, annoying, but does not interfere with basic activities of daily living (ADL). Patients are able to eat, bathe, get dressed, toileting (being able to get on and off the toilet and perform personal hygiene functions), transfer (move in and out of bed or a chair without assistance), and maintain continence (able to control bladder and bowel functions). Blood pressure and heart rate are unaffected. A normal heart rate for a healthy adult ranges from 60 to 100 bpm (beats per minute).   Mild to moderate pain 2 Noticeable and distracting. Impossible to hide from other people. More frequent flare-ups. Still possible to adapt and function close to normal. It can be very annoying and may have occasional stronger flare-ups. With discipline, patients may get used to it and adapt.   Moderate pain 3 Interferes significantly with activities of daily living (ADL). It becomes difficult to feed, bathe, get dressed, get on and off the toilet or to perform personal hygiene functions. Difficult to get in and out of bed or a chair  without assistance. Very distracting. With effort, it can be ignored when deeply involved in activities.   Moderately severe pain 4 Impossible to ignore for more than a few minutes. With effort, patients may still be able to manage work or participate in some social activities. Very difficult to concentrate. Signs of autonomic nervous system discharge are evident: dilated pupils (mydriasis); mild sweating (diaphoresis); sleep interference. Heart rate becomes elevated (>115 bpm). Diastolic blood pressure (lower number) rises above 100 mmHg. Patients find relief in laying down and not moving.   Severe pain 5 Intense and extremely unpleasant. Associated with frowning face and frequent crying. Pain overwhelms the senses.  Ability to do any activity or maintain social relationships becomes significantly limited. Conversation becomes difficult. Pacing back and forth is common, as getting into a comfortable position is nearly impossible. Pain wakes you up from deep sleep. Physical signs will be obvious: pupillary dilation; increased sweating; goosebumps; brisk reflexes; cold, clammy hands and feet; nausea, vomiting or dry heaves; loss of appetite; significant sleep disturbance with inability to fall asleep or to remain asleep. When persistent, significant weight loss is observed due to the complete loss of appetite and sleep deprivation.  Blood pressure and heart rate becomes significantly elevated. Caution: If elevated blood pressure triggers a pounding headache associated with blurred vision, then the patient should immediately seek attention at an urgent or emergency care unit, as these may be signs of an impending stroke.    Emergency Department Pain Levels (6-10/10)  Emergency Room Pain 6 Severely   limiting. Requires emergency care and should not be seen or managed at an outpatient pain management facility. Communication becomes difficult and requires great effort. Assistance to reach the emergency department  may be required. Facial flushing and profuse sweating along with potentially dangerous increases in heart rate and blood pressure will be evident.   Distressing pain 7 Self-care is very difficult. Assistance is required to transport, or use restroom. Assistance to reach the emergency department will be required. Tasks requiring coordination, such as bathing and getting dressed become very difficult.   Disabling pain 8 Self-care is no longer possible. At this level, pain is disabling. The individual is unable to do even the most "basic" activities such as walking, eating, bathing, dressing, transferring to a bed, or toileting. Fine motor skills are lost. It is difficult to think clearly.   Incapacitating pain 9 Pain becomes incapacitating. Thought processing is no longer possible. Difficult to remember your own name. Control of movement and coordination are lost.   The worst pain imaginable 10 At this level, most patients pass out from pain. When this level is reached, collapse of the autonomic nervous system occurs, leading to a sudden drop in blood pressure and heart rate. This in turn results in a temporary and dramatic drop in blood flow to the brain, leading to a loss of consciousness. Fainting is one of the body's self defense mechanisms. Passing out puts the brain in a calmed state and causes it to shut down for a while, in order to begin the healing process.    Summary: 1. Refer to this scale when providing [place] with your pain level. 2. Be accurate and careful when reporting your pain level. This will help with your care. 3. Over-reporting your pain level will lead to loss of credibility. 4. Even a level of 1/10 means that there is pain and will be treated at our facility. 5. High, inaccurate reporting will be documented as "Symptom Exaggeration", leading to loss of credibility and suspicions of possible secondary gains such as obtaining more narcotics, or wanting to appear disabled, for  fraudulent reasons. 6. Only pain levels of 5 or below will be seen at our facility. 7. Pain levels of 6 and above will be sent to the Emergency Department and the appointment cancelled. ____________________________________________________________________________________________   BMI Assessment: Estimated body mass index is 30.71 kg/m as calculated from the following:   Height as of this encounter: 5\' 8"  (1.727 m).   Weight as of this encounter: 202 lb (91.6 kg).  BMI interpretation table: BMI level Category Range association with higher incidence of chronic pain  <18 kg/m2 Underweight   18.5-24.9 kg/m2 Ideal body weight   25-29.9 kg/m2 Overweight Increased incidence by 20%  30-34.9 kg/m2 Obese (Class I) Increased incidence by 68%  35-39.9 kg/m2 Severe obesity (Class II) Increased incidence by 136%  >40 kg/m2 Extreme obesity (Class III) Increased incidence by 254%      BMI Readings from Last 4 Encounters:  03/17/18 30.71 kg/m  12/24/17 30.41 kg/m  07/17/17 29.80 kg/m  07/10/17 29.80 kg/m      Wt Readings from Last 4 Encounters:  03/17/18 202 lb (91.6 kg)  12/24/17 200 lb (90.7 kg)  07/17/17 196 lb (88.9 kg)  07/10/17 196 lb (88.9 kg)

## 2018-03-17 NOTE — Progress Notes (Signed)
Safety precautions to be maintained throughout the outpatient stay will include: orient to surroundings, keep bed in low position, maintain call bell within reach at all times, provide assistance with transfer out of bed and ambulation.  

## 2018-03-17 NOTE — Progress Notes (Signed)
Patient's Name: Samantha Macias  MRN: [id number]  Referring Provider: Diamond Nickel, MD  DOB: 20-Nov-1960  PCP: Tracie Harrier, MD  DOS: 03/17/2018  Note by: Dionisio David NP  Service setting: Ambulatory outpatient  Specialty: Interventional Pain Management  Location: [facility] (AMB) Pain Management Facility    Patient type: New Patient    Primary Reason(s) for Visit: Initial Patient Evaluation CC: Back Pain (low); Knee Pain (left); and Neck Pain  HPI  Samantha Macias is a 57 y.o. year old, female patient, who comes today for an initial evaluation. She has Allergic rhinitis; Anemia in chronic illness; GAD (generalized anxiety disorder); Back pain, chronic; Bipolar affective disorder, currently depressed, mild ([place]); Atherosclerosis of left carotid artery; Bilateral carpal tunnel syndrome; Chronic kidney disease (CKD), stage III (moderate) ([place]); Insomnia, persistent; Chronic pain syndrome; CN (constipation); Kidney cysts; Cervical osteoarthritis; Dyslipidemia; Fibromyalgia syndrome; Abnormal gait; Gastro-esophageal reflux disease without esophagitis; History of colon polyps; History of syncope; H/O transient cerebral ischemia; HPV test positive; Recurrent falls; Calculus of kidney; Lumbar radiculopathy; OCD (obsessive compulsive disorder); Overweight; PTSD (post-traumatic stress disorder); Trochanteric bursitis of both hips; Hyperparathyroidism, secondary renal ([place]); Type III hyperlipoproteinemia; Hypertension, benign; Osteoarthritis of both hands; Thyromegaly; Thyroid cyst; Connective tissue disease ([place]); Arthralgia of multiple joints; Vitamin D deficiency; HLD (hyperlipidemia); Anxiety; Acute pain of left knee; Anterior chest wall pain; Cardiac syncope; Chest pain; Precordial pain; Cigarette nicotine dependence without complication; COPD, moderate ([place]); Left hip pain; Lightheadedness; Loss of memory; Mild cognitive disorder; Mild cognitive impairment, so stated; Sleeping difficulties; Tobacco user;  Cervical myelopathy ([place]); DDD (degenerative disc disease); Cervical spondylosis with myelopathy; Fibromyalgia; Hypertension; Insomnia secondary to depression with anxiety; Chronic bilateral low back pain with bilateral sciatica (Primary Area of Pain) (L>R); Chronic pain of both lower extremities (Secondary Area of Pain) (L>R); Chronic upper back pain [facility] Area of Pain) (R>L); Pain in both hands (Fourth Area of Pain) (R>L); Chronic pain of left knee; Pharmacologic therapy; Disorder of skeletal system; Problems influencing health status; and Long term current use of opiate analgesic on their problem list.. Her primarily concern today is the Back Pain (low); Knee Pain (left); and Neck Pain  Pain Assessment: Location: Lower, Mid, Upper Back Radiating: hips,left knee Onset: More than a month ago Duration: Chronic pain Quality: Constant(unrelenting) Severity: 6 /10 (subjective, self-reported pain score)  Note: Reported level is compatible with observation. Clinically the patient looks like a 2/10 A 2/10 is viewed as "Mild to Moderate" and described as noticeable and distracting. Impossible to hide from other people. More frequent flare-ups. Still possible to adapt and function close to normal. It can be very annoying and may have occasional stronger flare-ups. With discipline, patients may get used to it and adapt. Information on the proper use of the pain scale provided to the patient today. When using our objective Pain Scale, levels between 6 and 10/10 are said to belong in an emergency room, as it progressively worsens from a 6/10, described as severely limiting, requiring emergency care not usually available at an outpatient pain management facility. At a 6/10 level, communication becomes difficult and requires great effort. Assistance to reach the emergency department may be required. Facial flushing and profuse sweating along with potentially dangerous increases in heart rate and blood pressure will  be evident. Effect on ADL:   Timing: Constant Modifying factors: Tramadol, heat, hot baths BP: (!) 125/101(right 125/79)  HR: 92  Onset and Duration: Date of onset: 15 years Cause of pain: Unknown Severity: Getting better, Getting worse, NAS-11 at  its worse: 9/10, NAS-11 at its best: 3/10, NAS-11 now: 7/10 and NAS-11 on the average: 6/10 Timing: Not influenced by the time of the day, During activity or exercise, After activity or exercise and After a period of immobility Aggravating Factors: Bending, Climbing, Intercourse (sex), Kneeling, Lifiting, Motion, Prolonged sitting, Prolonged standing, Squatting, Stooping , Twisting, Walking, Walking uphill and Walking downhill Alleviating Factors: Cold packs, Hot packs, Lying down, Medications, Nerve blocks, Warm showers or baths and PT Associated Problems: Dizziness, Erectile dysfunction, Fatigue, Inability to concentrate, Nausea, Numbness, Sadness, Spasms, Sweating, Swelling, Tingling, Vomiting , Weakness, Pain that wakes patient up and Pain that does not allow patient to sleep Quality of Pain: Aching, Agonizing, Constant, Cruel, Deep, Disabling, Distressing, Dreadful, Exhausting, Fearful, Getting longer, Horrible, Itching, Lancinating, Nagging, Pressure-like, Pulsating, Punishing, Sharp, Splitting, Stabbing, Tender, Throbbing, Tingling, Tiring, Toothache-like and Uncomfortable Previous Examinations or Tests: Bone scan, CT scan, Endoscopy, MRI scan, Nerve block, X-rays, Nerve conduction test, Neurological evaluation, Neurosurgical evaluation, Orthopedic evaluation and Psychiatric evaluation Previous Treatments: Epidural steroid injections, Facet blocks, Narcotic medications, Pool exercises, Relaxation therapy, Steroid treatments by mouth, Traction and Trigger point injections  The patient comes into the clinics today for the first time for a chronic pain management evaluation.  According to the patient her primary area of pain is in her lower back.  She  admits that the left side is greater than the right.  She denies any previous surgery.  She has had interventional therapy by Dr. Primus Bravo in the past.  She admits that the results vary.  She has completed physical therapy in the past.  She admits that she is currently in aqua therapy at [facility] performing her own exercises that were previously talked in therapy.  She has had recent images.  Her second area of pain is in her legs.  She admits that the left side is worse than the right.  He has pain that goes down the backs of her legs and turns to the knee.  She denies any numbness tingling in this area.  She admits that she does have some numbness and tingling in her feet.  She denies a previous nerve conduction study.  Her third area of pain is in her upper back.  She admits that this is worse on the right.  She denies any pain radiating.  She denies any previous surgery or interventional therapy.  She is in aqua therapy.  She has not had any recent images.    Her fourth area of pain is in her hands.  She admits that this is related to arthritis.  She admits that she has stiffness.  She denies any previous surgery.  She does receive injections with Dr. Meda Coffee, rheumatologist.  She admits that this is effective.    Her last area of pain is in her left knee.  She did suffer a fall.  She denies any previous surgery.  She did have steroid injection to her knee which was effective.  She is unsure of the recent images.  Today I took the time to provide the patient with information regarding this pain practice. The patient was informed that the practice is divided into two sections: an interventional pain management section, as well as a completely separate and distinct medication management section. I explained that there are procedure days for interventional therapies, and evaluation days for follow-ups and medication management. Because of the amount of documentation required during both, they are kept separated.  This means that there is the possibility that  she may be scheduled for a procedure on one day, and medication management the next. I have also informed her that because of staffing and facility limitations, this practice will no longer take patients for medication management only. To illustrate the reasons for this, I gave the patient the example of surgeons, and how inappropriate it would be to refer a patient to his/her care, just to write for the post-surgical antibiotics on a surgery done by a different surgeon.   Because interventional pain management is part of the board-certified specialty for the doctors, the patient was informed that joining this practice means that they are open to any and all interventional therapies. I made it clear that this does not mean that they will be forced to have any procedures done. What this means is that I believe interventional therapies to be essential part of the diagnosis and proper management of chronic pain conditions. Therefore, patients not interested in these interventional alternatives will be better served under the care of a different practitioner.  The patient was also made aware of my Comprehensive Pain Management Safety Guidelines where by joining this practice, they limit all of their nerve blocks and joint injections to those done by our practice, for as long as we are retained to manage their care. Historic Controlled Substance Pharmacotherapy Review  PMP and historical list of controlled substances: Tramadol 50 mg, alprazolam 0.5 mg, pregabalin 200 mg, oxycodone/acetaminophen 5/325 mg, Lyrica 300 mg, zolpidem 10 mg, Vicodin 5/300 mg, diazepam 0.5 mg, Lyrica 150 mg, Lyrica 75 mg, hydrocodone/acetaminophen 10/325 mg, Highest opioid analgesic regimen found: Hydrocodone/acetaminophen 10/325 1 tablet 3 times daily (last fill date 01/08/2013) hydrocodone 30 mg/day Most recent opioid analgesic: Tramadol 50 mg 1 tablet 4 times daily (fill date in 07/2017)  tramadol 200 mg/day Current opioid analgesics:Tramadol 50 mg 1 tablet 4 times daily (fill date in 07/2017) tramadol 200 mg/day Highest recorded MME/day: 30 mg/day MME/day: 20 mg/day Medications: The patient did not bring the medication(s) to the appointment, as requested in our "New Patient Package" Pharmacodynamics: Desired effects: Analgesia: The patient reports >50% benefit. Reported improvement in function: The patient reports medication allows her to accomplish basic ADLs. Clinically meaningful improvement in function (CMIF): Sustained CMIF goals met Perceived effectiveness: Described as relatively effective, allowing for increase in activities of daily living (ADL) Undesirable effects: Side-effects or Adverse reactions: None reported Historical Monitoring: The patient  reports that she has current or past drug history. Drug: Cocaine. List of all UDS Test(s): Lab Results  Component Value Date   MDMA NONE DETECTED 08/07/2017   MDMA NONE DETECTED 07/17/2017   COCAINSCRNUR POSITIVE (A) 08/07/2017   COCAINSCRNUR POSITIVE (A) 07/17/2017   PCPSCRNUR NONE DETECTED 08/07/2017   PCPSCRNUR NONE DETECTED 07/17/2017   THCU NONE DETECTED 08/07/2017   THCU NONE DETECTED 07/17/2017   List of all Serum Drug Screening Test(s):  No results found for: AMPHSCRSER, BARBSCRSER, BENZOSCRSER, COCAINSCRSER, PCPSCRSER, PCPQUANT, THCSCRSER, CANNABQUANT, OPIATESCRSER, OXYSCRSER, PROPOXSCRSER Historical Background Evaluation: [place] PDMP: Six (6) year initial data search conducted.            NAR X scores narcotics 481 sedative 612 overdose risk score 350 [place] Department of public safety, offender search: Editor, commissioning Information) Non-contributory Risk Assessment Profile: Aberrant behavior: None observed or detected today Risk factors for fatal opioid overdose: bipolar disorder, caucasian, concomitant use of Benzodiazepines, family history of addiction and history of substance use disorder Fatal overdose hazard ratio  (HR): Calculation deferred Non-fatal overdose hazard ratio (HR): Calculation deferred Risk of opioid  abuse or dependence: 0.7-3.0% with doses ? 36 MME/day and 6.1-26% with doses ? 120 MME/day. Substance use disorder (SUD) risk level: Pending results of Medical Psychology Evaluation for SUD Opioid risk tool (ORT) (Total Score): 5  ORT Scoring interpretation table:  Score <3 = Low Risk for SUD  Score between 4-7 = Moderate Risk for SUD  Score >8 = High Risk for Opioid Abuse   PHQ-2 Depression Scale:  Total score: 0  PHQ-2 Scoring interpretation table: (Score and probability of major depressive disorder)  Score 0 = No depression  Score 1 = 15.4% Probability  Score 2 = 21.1% Probability  Score 3 = 38.4% Probability  Score 4 = 45.5% Probability  Score 5 = 56.4% Probability  Score 6 = 78.6% Probability   PHQ-9 Depression Scale:  Total score: 0  PHQ-9 Scoring interpretation table:  Score 0-4 = No depression  Score 5-9 = Mild depression  Score 10-14 = Moderate depression  Score 15-19 = Moderately severe depression  Score 20-27 = Severe depression (2.4 times higher risk of SUD and 2.89 times higher risk of overuse)   Pharmacologic Plan: Pending ordered tests and/or consults  Meds  The patient has a current medication list which includes the following prescription(s): albuterol, alprazolam, aspirin ec, bupropion, vitamin d, diclofenac sodium, donepezil, formoterol, ibuprofen, ipratropium, losartan-hydrochlorothiazide, mirtazapine, omeprazole, pantoprazole, pregabalin, quetiapine, ramelteon, sertraline, spiriva handihaler, tizanidine, and tramadol.  Current Outpatient Medications on File Prior to Visit  Medication Sig  . albuterol (PROVENTIL HFA;VENTOLIN HFA) 108 (90 Base) MCG/ACT inhaler Inhale 2 puffs into the lungs every 6 (six) hours as needed for wheezing or shortness of breath.  . ALPRAZolam (XANAX) 0.5 MG tablet Take 0.5-0.75 mg by mouth at bedtime as needed for anxiety.  Marland Kitchen  aspirin EC 81 MG tablet Take 1 tablet (81 mg total) by mouth at bedtime.  Marland Kitchen buPROPion (WELLBUTRIN XL) 300 MG 24 hr tablet Take 300 mg by mouth daily.  . Cholecalciferol (VITAMIN D) 2000 units tablet Take 2,000 Units by mouth daily.  . diclofenac sodium (VOLTAREN) 1 % GEL Apply 4 g topically 4 (four) times daily as needed. (Patient taking differently: Apply 4 g topically 4 (four) times daily as needed. For pain)  . donepezil (ARICEPT) 10 MG tablet Take 10 mg by mouth at bedtime.  . formoterol (PERFOROMIST) 20 MCG/2ML nebulizer solution Take 20 mcg by nebulization 2 (two) times daily.  Marland Kitchen ibuprofen (ADVIL,MOTRIN) 600 MG tablet Take 1 tablet (600 mg total) by mouth every 6 (six) hours as needed.  Marland Kitchen ipratropium (ATROVENT) 0.02 % nebulizer solution Take 0.5 mg by nebulization 3 (three) times daily.  Marland Kitchen losartan-hydrochlorothiazide (HYZAAR) 100-12.5 MG tablet Take 1 tablet by mouth daily.  . mirtazapine (REMERON) 15 MG tablet Take 15 mg by mouth at bedtime.  Marland Kitchen omeprazole (PRILOSEC) 20 MG capsule Take 20 mg by mouth daily.  . pantoprazole (PROTONIX) 40 MG tablet Take 40 mg by mouth daily.  . pregabalin (LYRICA) 200 MG capsule Take 1 capsule (200 mg total) by mouth 3 (three) times daily.  . QUEtiapine (SEROQUEL) 100 MG tablet Take 150 mg by mouth at bedtime.  . ramelteon (ROZEREM) 8 MG tablet Take 1 tablet (8 mg total) by mouth at bedtime.  . sertraline (ZOLOFT) 50 MG tablet Take 50 mg by mouth daily.  Marland Kitchen SPIRIVA HANDIHALER 18 MCG inhalation capsule Place 1 puff into inhaler and inhale daily.  Marland Kitchen tiZANidine (ZANAFLEX) 2 MG tablet Take 1 tablet (2 mg total) by mouth every 6 (six) hours as  needed for muscle spasms. (Patient taking differently: Take 4 mg by mouth every 6 (six) hours as needed for muscle spasms. )  . traMADol (ULTRAM) 50 MG tablet Take 1 tablet (50 mg total) by mouth every 8 (eight) hours as needed. (Patient taking differently: Take 50 mg by mouth every 6 (six) hours as needed for moderate pain. )    No current facility-administered medications on file prior to visit.    Imaging Review  Cervical Imaging: Cervical MR wo contrast:  Results for orders placed during the hospital encounter of 05/17/17  MR CERVICAL SPINE WO CONTRAST   Narrative CLINICAL DATA:  57 year old female with chronic cervical neck pain. Recent pain radiation down the left arm with numbness from the elbow distally. Chronic lumbar back pain progressive for 1 month. Frequent falls. Painful sitting and standing straight.  EXAM: MRI CERVICAL AND LUMBAR SPINE WITHOUT CONTRAST  TECHNIQUE: Multiplanar and multiecho pulse sequences of the cervical spine, to include the craniocervical junction and cervicothoracic junction, and lumbar spine, were obtained without intravenous contrast.  COMPARISON:  Brain MRI 07/27/2015. Cervical spine MRI 10/11/2009. Lumbar MRI 03/15/2016. CT Abdomen and Pelvis 12/22/2013.  FINDINGS: MRI CERVICAL SPINE FINDINGS  Alignment: Chronic straightening of cervical lordosis. Mild anterolisthesis of C3 on C4 appears stable but similar mild degenerative appearing anterolisthesis of C4 on C5 has developed since 2011. Subtle anterolisthesis of C5 on C6.  Vertebrae: There is mild degenerative appearing marrow edema in the C5 lamina posteriorly in the midline (series 5, image 7). This is new compared to 2011. Background bone marrow signal is normal. Chronic but increased degenerative endplate irregularity inferiorly at C6. No other marrow edema or acute osseous abnormality.  Cord: Spinal cord signal is within normal limits at all visualized levels.  Posterior Fossa, vertebral arteries, paraspinal tissues: Negative visualized posterior fossa. Cervicomedullary junction is within normal limits. Preserved major vascular flow voids in the neck. Negative neck soft tissues.  Disc levels:  C2-C3: Moderate to severe facet hypertrophy greater on the right has developed since 2011. Right  foraminal disc bulge and endplate spurring. No spinal stenosis, but moderate right C3 neural foraminal stenosis is new from the prior MRI.  C3-C4: Moderate to severe facet hypertrophy greater on the right has progressed since 2011. Mild foraminal disc bulging and endplate spurring greater on the right. Mild left and moderate to severe right C4 foraminal stenosis have increased since 2011.  C4-C5: Chronic left side facet hypertrophy, but moderate to severe facet degeneration has progressed since 2011. Left greater than right foraminal disc bulging and endplate spurring. Mild left ligament flavum hypertrophy. No spinal stenosis. Severe left and mild to moderate right C5 foraminal stenosis have progressed.  C5-C6: Mild facet hypertrophy has increased on the right. Mild circumferential disc bulge with foraminal disc and endplate spurring. No spinal stenosis. Increased borderline to mild left and moderate right C6 foraminal stenosis.  C6-C7: Disc space loss since 2011. Circumferential disc bulge and endplate spurring with broad-based posterior and biforaminal component. Mild ligament flavum hypertrophy. Mild spinal stenosis. Borderline to mild spinal cord mass effect. Moderate to severe bilateral C7 neural foraminal stenosis. This level has progressed since 2011.  C7-T1: Progressed moderate facet hypertrophy greater on the left. Mild foraminal endplate spurring. Increased mild left C8 foraminal stenosis.  No upper thoracic spinal or foraminal stenosis.  MRI LUMBAR SPINE FINDINGS  Segmentation: Normal as demonstrated on the 2015 CT, which is the same numbering system used on the 2017 MRI.  Alignment: Stable vertebral height and alignment  since 2017. Mild retrolisthesis at L5-S1. Relatively preserved lumbar lordosis; mild reversal at L1-L2.  Vertebrae: Chronic but perhaps mildly increased degenerative appearing endplate marrow edema posteriorly and to the left at L5-S1.  New  degenerative appearing marrow edema throughout the chronically degenerated right L4 and L5 facets (with some pedicle involvement). See series 12, images 3 and 4).  Background bone marrow signal remains normal. There is trace degenerative endplate edema anteriorly superiorly at L3. No other acute osseous abnormality. Intact visible sacrum and SI joints.  Conus medullaris and cauda equina: Conus extends to the T12-L1 level. Conus and cauda equina appear normal. Fairly capacious spinal canal.  Paraspinal and other soft tissues: Negative.  Disc levels:  T12-L1:  Negative.  L1-L2: Chronic disc desiccation and disc space loss. Stable mild right paracentral disc protrusion. No stenosis.  L2-L3: Mild chronic disc bulge, most affecting the left neural foramen. Small left foraminal annular fissure of the disc is increased in conspicuity on series 10, image 12. Stable mild facet hypertrophy. Mild left L2 neural foraminal stenosis is stable.  L3-L4: Chronic disc desiccation and mostly far lateral disc bulging. Mild facet hypertrophy. No stenosis.  L4-L5: Chronic circumferential disc bulge. Broad-based bilateral foraminal involvement with endplate spurring far laterally on the right. Possible chronic left foraminal annular fissure. Moderate to severe facet hypertrophy. Increased bilateral facet joint fluid, greater on the right. Borderline to mild spinal stenosis and right lateral recess stenosis (descending right L5 nerve level). Mild bilateral L4 foraminal stenosis. This level has progressed since 2017.  L5-S1: Chronic circumferential disc bulge and endplate spurring eccentric to the left and most affecting the left neural foramen. Mild to moderate facet hypertrophy is stable. Increased mild epidural lipomatosis. No spinal or lateral recess stenosis. Moderate left L5 foraminal stenosis is stable.  IMPRESSION: CERVICAL SPINE:  1. Progressed cervical spine degeneration since 2011.  Multilevel mild spondylolisthesis associated with increased moderate to severe facet hypertrophy at multiple levels. Associated degenerative appearing mild marrow edema in the right C5 lamina. Subsequent new or increased neural foraminal stenosis which is moderate or severe at the right C3, right C4, left C5, right C6 and bilateral C7 nerve levels. 2. Multifactorial mild cervical spinal stenosis at C6-C7 with up to mild spinal cord mass effect, and the bilateral C7 foraminal stenosis. No spinal cord signal abnormality.  LUMBAR SPINE:  1. Chronic facet degeneration at L4 and L5 has progressed since the 2017 MRI and is severe, with associated new degenerative marrow edema. Increased multifactorial mild spinal, right lateral recess, and bilateral neural foraminal stenosis at that level. 2. Other lumbar levels appear stable since 2017, including chronic disc and endplate degeneration eccentric to the left at L5-S1.   Electronically Signed   By: Genevie Ann M.D.   On: 05/17/2017 12:17   Lumbosacral Imaging: Lumbar MR wo contrast:  Results for orders placed during the hospital encounter of 05/17/17  MR LUMBAR SPINE WO CONTRAST   Narrative CLINICAL DATA:  57 year old female with chronic cervical neck pain. Recent pain radiation down the left arm with numbness from the elbow distally. Chronic lumbar back pain progressive for 1 month. Frequent falls. Painful sitting and standing straight.  EXAM: MRI CERVICAL AND LUMBAR SPINE WITHOUT CONTRAST  TECHNIQUE: Multiplanar and multiecho pulse sequences of the cervical spine, to include the craniocervical junction and cervicothoracic junction, and lumbar spine, were obtained without intravenous contrast.  COMPARISON:  Brain MRI 07/27/2015. Cervical spine MRI 10/11/2009. Lumbar MRI 03/15/2016. CT Abdomen and Pelvis 12/22/2013.  FINDINGS: MRI CERVICAL  SPINE FINDINGS  Alignment: Chronic straightening of cervical lordosis. Mild anterolisthesis  of C3 on C4 appears stable but similar mild degenerative appearing anterolisthesis of C4 on C5 has developed since 2011. Subtle anterolisthesis of C5 on C6.  Vertebrae: There is mild degenerative appearing marrow edema in the C5 lamina posteriorly in the midline (series 5, image 7). This is new compared to 2011. Background bone marrow signal is normal. Chronic but increased degenerative endplate irregularity inferiorly at C6. No other marrow edema or acute osseous abnormality.  Cord: Spinal cord signal is within normal limits at all visualized levels.  Posterior Fossa, vertebral arteries, paraspinal tissues: Negative visualized posterior fossa. Cervicomedullary junction is within normal limits. Preserved major vascular flow voids in the neck. Negative neck soft tissues.  Disc levels:  C2-C3: Moderate to severe facet hypertrophy greater on the right has developed since 2011. Right foraminal disc bulge and endplate spurring. No spinal stenosis, but moderate right C3 neural foraminal stenosis is new from the prior MRI.  C3-C4: Moderate to severe facet hypertrophy greater on the right has progressed since 2011. Mild foraminal disc bulging and endplate spurring greater on the right. Mild left and moderate to severe right C4 foraminal stenosis have increased since 2011.  C4-C5: Chronic left side facet hypertrophy, but moderate to severe facet degeneration has progressed since 2011. Left greater than right foraminal disc bulging and endplate spurring. Mild left ligament flavum hypertrophy. No spinal stenosis. Severe left and mild to moderate right C5 foraminal stenosis have progressed.  C5-C6: Mild facet hypertrophy has increased on the right. Mild circumferential disc bulge with foraminal disc and endplate spurring. No spinal stenosis. Increased borderline to mild left and moderate right C6 foraminal stenosis.  C6-C7: Disc space loss since 2011. Circumferential disc bulge  and endplate spurring with broad-based posterior and biforaminal component. Mild ligament flavum hypertrophy. Mild spinal stenosis. Borderline to mild spinal cord mass effect. Moderate to severe bilateral C7 neural foraminal stenosis. This level has progressed since 2011.  C7-T1: Progressed moderate facet hypertrophy greater on the left. Mild foraminal endplate spurring. Increased mild left C8 foraminal stenosis.  No upper thoracic spinal or foraminal stenosis.  MRI LUMBAR SPINE FINDINGS  Segmentation: Normal as demonstrated on the 2015 CT, which is the same numbering system used on the 2017 MRI.  Alignment: Stable vertebral height and alignment since 2017. Mild retrolisthesis at L5-S1. Relatively preserved lumbar lordosis; mild reversal at L1-L2.  Vertebrae: Chronic but perhaps mildly increased degenerative appearing endplate marrow edema posteriorly and to the left at L5-S1.  New degenerative appearing marrow edema throughout the chronically degenerated right L4 and L5 facets (with some pedicle involvement). See series 12, images 3 and 4).  Background bone marrow signal remains normal. There is trace degenerative endplate edema anteriorly superiorly at L3. No other acute osseous abnormality. Intact visible sacrum and SI joints.  Conus medullaris and cauda equina: Conus extends to the T12-L1 level. Conus and cauda equina appear normal. Fairly capacious spinal canal.  Paraspinal and other soft tissues: Negative.  Disc levels:  T12-L1:  Negative.  L1-L2: Chronic disc desiccation and disc space loss. Stable mild right paracentral disc protrusion. No stenosis.  L2-L3: Mild chronic disc bulge, most affecting the left neural foramen. Small left foraminal annular fissure of the disc is increased in conspicuity on series 10, image 12. Stable mild facet hypertrophy. Mild left L2 neural foraminal stenosis is stable.  L3-L4: Chronic disc desiccation and mostly far lateral  disc bulging. Mild facet hypertrophy. No stenosis.  L4-L5:  Chronic circumferential disc bulge. Broad-based bilateral foraminal involvement with endplate spurring far laterally on the right. Possible chronic left foraminal annular fissure. Moderate to severe facet hypertrophy. Increased bilateral facet joint fluid, greater on the right. Borderline to mild spinal stenosis and right lateral recess stenosis (descending right L5 nerve level). Mild bilateral L4 foraminal stenosis. This level has progressed since 2017.  L5-S1: Chronic circumferential disc bulge and endplate spurring eccentric to the left and most affecting the left neural foramen. Mild to moderate facet hypertrophy is stable. Increased mild epidural lipomatosis. No spinal or lateral recess stenosis. Moderate left L5 foraminal stenosis is stable.  IMPRESSION: CERVICAL SPINE:  1. Progressed cervical spine degeneration since 2011. Multilevel mild spondylolisthesis associated with increased moderate to severe facet hypertrophy at multiple levels. Associated degenerative appearing mild marrow edema in the right C5 lamina. Subsequent new or increased neural foraminal stenosis which is moderate or severe at the right C3, right C4, left C5, right C6 and bilateral C7 nerve levels. 2. Multifactorial mild cervical spinal stenosis at C6-C7 with up to mild spinal cord mass effect, and the bilateral C7 foraminal stenosis. No spinal cord signal abnormality.  LUMBAR SPINE:  1. Chronic facet degeneration at L4 and L5 has progressed since the 2017 MRI and is severe, with associated new degenerative marrow edema. Increased multifactorial mild spinal, right lateral recess, and bilateral neural foraminal stenosis at that level. 2. Other lumbar levels appear stable since 2017, including chronic disc and endplate degeneration eccentric to the left at L5-S1.   Electronically Signed   By: Genevie Ann M.D.   On: 05/17/2017 12:17   Hip  Imaging:  Hip-L DG 2-3 views:  Results for orders placed during the hospital encounter of 11/28/15  DG HIP UNILAT WITH PELVIS 2-3 VIEWS LEFT   Narrative CLINICAL DATA:  Fall last night.  Left hip pain.  EXAM: DG HIP (WITH OR WITHOUT PELVIS) 2-3V LEFT  COMPARISON:  None.  FINDINGS: No acute bony abnormality. Specifically, no fracture, subluxation, or dislocation. Soft tissues are intact. SI joints and hip joints are symmetric and unremarkable.  IMPRESSION: Negative.   Electronically Signed   By: Rolm Baptise M.D.   On: 11/28/2015 11:38   Knee Imaging:  Knee-L DG 4 views:  Results for orders placed during the hospital encounter of 11/28/15  DG Knee Complete 4 Views Left   Narrative CLINICAL DATA:  Fall yesterday with persistent left knee pain, initial encounter  EXAM: LEFT KNEE - COMPLETE 4+ VIEW  COMPARISON:  None.  FINDINGS: No evidence of fracture, dislocation, or joint effusion. No evidence of arthropathy or other focal bone abnormality. Soft tissues are unremarkable.  IMPRESSION: No acute abnormality noted.   Electronically Signed   By: Inez Catalina M.D.   On: 11/28/2015 11:28     Note: Available results from prior imaging studies were reviewed.        ROS  Cardiovascular History: No reported cardiovascular signs or symptoms such as High blood pressure, coronary artery disease, abnormal heart rate or rhythm, heart attack, blood thinner therapy or heart weakness and/or failure Pulmonary or Respiratory History: Difficulty blowing air out (Emphysema), Smoking and Snoring  Neurological History: No reported neurological signs or symptoms such as seizures, abnormal skin sensations, urinary and/or fecal incontinence, being born with an abnormal open spine and/or a tethered spinal cord Review of Past Neurological Studies:  Results for orders placed or performed during the hospital encounter of 07/07/15  MR Brain Wo Contrast   Narrative   CLINICAL  DATA:   57 year old hypertensive female with hyperlipidemia presenting with drooling and difficulty finding words with dizziness. Subsequent encounter.  EXAM: MRI HEAD WITHOUT CONTRAST  MRA HEAD WITHOUT CONTRAST  TECHNIQUE: Multiplanar, multiecho pulse sequences of the brain and surrounding structures were obtained without intravenous contrast. Angiographic images of the head were obtained using MRA technique without contrast.  COMPARISON:  07/07/2015 head CT.  02/10/2014 brain MR.  FINDINGS: MRI HEAD FINDINGS  No acute infarct or intracranial hemorrhage.  Minimal matter changes suggestive of minimal chronic small vessel disease.  No intracranial mass lesion noted on this unenhanced exam.  Mild atrophy without hydrocephalus.  Mild exophthalmos.  Orbital structures otherwise unremarkable.  Right C2-3 facet degenerative changes. Congenital narrowing upper cervical canal. Cervical medullary junction unremarkable.  MRA HEAD FINDINGS  Anterior circulation without medium or large size vessel significant stenosis or occlusion.  Mild anterior cerebral artery and middle cerebral artery branch vessel irregularity.  Mild ectasia vertebral arteries and basilar artery. Mild narrowing distal right vertebral artery.  Poor delineation of left posterior inferior cerebellar artery. Small right posterior inferior cerebellar artery.  Nonvisualized anterior inferior cerebellar arteries.  Mild irregularity superior cerebellar arteries.  Mild narrowing proximal posterior cerebral artery bilaterally. Mild to slightly moderate narrowing of portions of the distal posterior cerebral artery branches bilaterally.  IMPRESSION: MRI HEAD  No acute infarct or intracranial hemorrhage.  Minimal matter changes suggestive of minimal chronic small vessel disease.  No intracranial mass lesion noted on this unenhanced exam.  Mild atrophy without hydrocephalus.  Mild exophthalmos.  MRA  HEAD  Intracranial branch vessel irregularity as detailed above.   Electronically Signed   By: Genia Del M.D.   On: 07/07/2015 15:57   MR MRA HEAD WO CONTRAST   Narrative   CLINICAL DATA:  57 year old hypertensive female with hyperlipidemia presenting with drooling and difficulty finding words with dizziness. Subsequent encounter.  EXAM: MRI HEAD WITHOUT CONTRAST  MRA HEAD WITHOUT CONTRAST  TECHNIQUE: Multiplanar, multiecho pulse sequences of the brain and surrounding structures were obtained without intravenous contrast. Angiographic images of the head were obtained using MRA technique without contrast.  COMPARISON:  07/07/2015 head CT.  02/10/2014 brain MR.  FINDINGS: MRI HEAD FINDINGS  No acute infarct or intracranial hemorrhage.  Minimal matter changes suggestive of minimal chronic small vessel disease.  No intracranial mass lesion noted on this unenhanced exam.  Mild atrophy without hydrocephalus.  Mild exophthalmos.  Orbital structures otherwise unremarkable.  Right C2-3 facet degenerative changes. Congenital narrowing upper cervical canal. Cervical medullary junction unremarkable.  MRA HEAD FINDINGS  Anterior circulation without medium or large size vessel significant stenosis or occlusion.  Mild anterior cerebral artery and middle cerebral artery branch vessel irregularity.  Mild ectasia vertebral arteries and basilar artery. Mild narrowing distal right vertebral artery.  Poor delineation of left posterior inferior cerebellar artery. Small right posterior inferior cerebellar artery.  Nonvisualized anterior inferior cerebellar arteries.  Mild irregularity superior cerebellar arteries.  Mild narrowing proximal posterior cerebral artery bilaterally. Mild to slightly moderate narrowing of portions of the distal posterior cerebral artery branches bilaterally.  IMPRESSION: MRI HEAD  No acute infarct or intracranial hemorrhage.  Minimal  matter changes suggestive of minimal chronic small vessel disease.  No intracranial mass lesion noted on this unenhanced exam.  Mild atrophy without hydrocephalus.  Mild exophthalmos.  MRA HEAD  Intracranial branch vessel irregularity as detailed above.   Electronically Signed   By: Genia Del M.D.   On: 07/07/2015 15:57   CT Head Wo Contrast  Narrative   CLINICAL DATA:  CODE STROKE Dr. Marcelene [place] [phone]. HX TIA, C/O weakness, difficulty seeing out of right. Difficulty speaking for weeks. Expressive aphasia. No recent trauma. Stage 4 kidney failure. TKV  EXAM: CT HEAD WITHOUT CONTRAST  TECHNIQUE: Contiguous axial images were obtained from the base of the skull through the vertex without intravenous contrast.  COMPARISON:  MR 02/10/2014 and previous  FINDINGS: Brain: Stable mild parenchymal atrophy. No evidence of acute infarction, hemorrhage, extra-axial collection, ventriculomegaly, or mass effect.  Vascular: No hyperdense vessel or unexpected calcification.  Skull: Negative for fracture or focal lesion.  Sinuses/Orbits: No acute findings.  Hypoplastic frontal sinuses.  Other: None.  IMPRESSION: 1. Stable mild atrophy. No bleed or other acute intracranial process. Critical Value/emergent results were called by telephone at the time of interpretation on 07/07/2015 at 1:57 pm to Dr. Reita Cliche, who verbally acknowledged these results.   Electronically Signed   By: Lucrezia Europe M.D.   On: 07/07/2015 14:08    Psychological-Psychiatric History: Anxiousness, Prone to panicking and Difficulty sleeping and or falling asleep Gastrointestinal History: Reflux or heatburn and Alternating episodes iof diarrhea and constipation (IBS-Irritable bowe syndrome) Genitourinary History: Kidney disease Hematological History: Brusing easily Endocrine History: No reported endocrine signs or symptoms such as high or low blood sugar, rapid heart rate due to high thyroid levels,  obesity or weight gain due to slow thyroid or thyroid disease Rheumatologic History: Butterfly-like facial rash (Lupus), Joint aches and or swelling due to excess weight (Osteoarthritis) and Generalized muscle aches (Fibromyalgia) Musculoskeletal History: Negative for myasthenia gravis, muscular dystrophy, multiple sclerosis or malignant hyperthermia Work History: Retired  Allergies  Ms. Gruel is allergic to shellfish allergy; tetanus toxoid adsorbed; ace inhibitors; codeine; nitrofurantoin monohyd macro; and red dye.  Laboratory Chemistry  Inflammation Markers No results found for: CRP, ESRSEDRATE (CRP: Acute Phase) (ESR: Chronic Phase) Renal Function Markers Lab Results  Component Value Date   BUN 23 (H) 12/24/2017   CREATININE 1.15 (H) 12/24/2017   GFRAA >60 12/24/2017   GFRNONAA 53 (L) 12/24/2017   Hepatic Function Markers Lab Results  Component Value Date   AST 17 12/24/2017   ALT 15 12/24/2017   ALBUMIN 3.7 12/24/2017   ALKPHOS 96 12/24/2017   Electrolytes Lab Results  Component Value Date   NA 139 12/24/2017   K 3.3 (L) 12/24/2017   CL 105 12/24/2017   CALCIUM 8.6 (L) 12/24/2017   Neuropathy Markers No results found for: [id number] Bone Pathology Markers Lab Results  Component Value Date   ALKPHOS 96 12/24/2017   CALCIUM 8.6 (L) 12/24/2017   Coagulation Parameters Lab Results  Component Value Date   INR 0.91 07/10/2017   LABPROT 12.2 07/10/2017   APTT 27 07/10/2017   PLT 224 12/24/2017   Cardiovascular Markers Lab Results  Component Value Date   BNP 135.0 (H) 11/08/2016   HGB 13.4 12/24/2017   HCT 38.5 12/24/2017   Note: Lab results reviewed.  [place]  Drug: Ms. Machnik  reports that she has current or past drug history. Drug: Cocaine. Alcohol:  reports that she does not drink alcohol. Tobacco:  reports that she has been smoking e-cigarettes. She has a 6.25 pack-year smoking history. She has never used smokeless tobacco. Medical:  has a past  medical history of Abnormal gait, Allergy, Anxiety, Arthritis, Back pain, Bipolar disorder (HCC), CAD (coronary artery disease), Chronic kidney disease, Collagen vascular disease ([place]), Colon polyp, COPD (chronic obstructive pulmonary disease) ([place]), [place], Fibromyalgia, GERD (gastroesophageal reflux disease), Hyperlipidemia, Hypertension, IBS (irritable  bowel syndrome), Kidney disease, Migraine, Numbness and tingling, Renal insufficiency, Spinal stenosis, Static encephalopathy, Thyroid nodule (07/2017), and TIA (transient ischemic attack). Family: family history includes Alcohol abuse in her father; Anxiety disorder in her mother; Bipolar disorder in her mother; Congestive Heart Failure in her father; Depression in her mother; Heart attack in her father; Heart disease in her father and mother; Mental illness in her mother; Peripheral vascular disease in her mother; Thyroid disease in her mother.  Past Surgical History:  Procedure Laterality Date  . COLONOSCOPY WITH PROPOFOL N/A 04/01/2017   Procedure: COLONOSCOPY WITH PROPOFOL;  Surgeon: Lollie Sails, MD;  Location: [facility] ENDOSCOPY;  Service: Endoscopy;  Laterality: N/A;  . ESOPHAGOGASTRODUODENOSCOPY (EGD) WITH PROPOFOL N/A 04/01/2017   Procedure: ESOPHAGOGASTRODUODENOSCOPY (EGD) WITH PROPOFOL;  Surgeon: Lollie Sails, MD;  Location: [facility] ENDOSCOPY;  Service: Endoscopy;  Laterality: N/A;  . MOUTH SURGERY    . NASAL SINUS SURGERY    . TUBAL LIGATION     Active Ambulatory Problems    Diagnosis Date Noted  . Allergic rhinitis 12/01/2014  . Anemia in chronic illness 12/01/2014  . GAD (generalized anxiety disorder) 12/01/2014  . Back pain, chronic 12/01/2014  . Bipolar affective disorder, currently depressed, mild ([place]) 12/01/2014  . Atherosclerosis of left carotid artery 12/01/2014  . Bilateral carpal tunnel syndrome 12/01/2014  . Chronic kidney disease (CKD), stage III (moderate) ([place]) 12/01/2014  . Insomnia, persistent 12/01/2014  .  Chronic pain syndrome 12/01/2014  . CN (constipation) 12/01/2014  . Kidney cysts 12/01/2014  . Cervical osteoarthritis 12/01/2014  . Dyslipidemia 12/01/2014  . Fibromyalgia syndrome 12/01/2014  . Abnormal gait 12/01/2014  . Gastro-esophageal reflux disease without esophagitis 12/01/2014  . History of colon polyps 12/01/2014  . History of syncope 12/01/2014  . H/O transient cerebral ischemia 12/01/2014  . HPV test positive 12/01/2014  . Recurrent falls 12/01/2014  . Calculus of kidney 12/01/2014  . Lumbar radiculopathy 12/01/2014  . OCD (obsessive compulsive disorder) 12/01/2014  . Overweight 12/01/2014  . PTSD (post-traumatic stress disorder) 12/01/2014  . Trochanteric bursitis of both hips 12/01/2014  . Hyperparathyroidism, secondary renal ([place]) 12/03/2014  . Type III hyperlipoproteinemia 02/19/2014  . Hypertension, benign 07/01/2015  . Osteoarthritis of both hands 08/01/2015  . Thyromegaly 08/18/2015  . Thyroid cyst 08/18/2015  . Connective tissue disease ([place]) 02/17/2016  . Arthralgia of multiple joints 02/17/2016  . Vitamin D deficiency 03/28/2016  . HLD (hyperlipidemia) 03/28/2016  . Anxiety 03/28/2016  . Acute pain of left knee 02/19/2018  . Anterior chest wall pain 09/19/2016  . Cardiac syncope 11/16/2016  . Chest pain 03/17/2018  . Precordial pain 11/16/2016  . Cigarette nicotine dependence without complication 27/74/1287  . COPD, moderate ([place]) 10/09/2016  . Left hip pain 02/19/2018  . Lightheadedness 03/04/2017  . Loss of memory 04/11/2016  . Mild cognitive disorder 11/21/2016  . Mild cognitive impairment, so stated 11/21/2016  . Sleeping difficulties 03/04/2017  . Tobacco user 05/09/2016  . Cervical myelopathy ([place]) 06/27/2017  . DDD (degenerative disc disease) 03/29/2016  . Cervical spondylosis with myelopathy 07/02/2017  . Fibromyalgia 12/01/2014  . Hypertension 03/17/2018  . Insomnia secondary to depression with anxiety 04/17/2016  . Chronic bilateral  low back pain with bilateral sciatica (Primary Area of Pain) (L>R) 03/17/2018  . Chronic pain of both lower extremities (Secondary Area of Pain) (L>R) 03/17/2018  . Chronic upper back pain [facility] Area of Pain) (R>L) 03/17/2018  . Pain in both hands (Fourth Area of Pain) (R>L) 03/17/2018  . Chronic pain of left  knee 03/17/2018  . Pharmacologic therapy 03/17/2018  . Disorder of skeletal system 03/17/2018  . Problems influencing health status 03/17/2018  . Long term current use of opiate analgesic 03/17/2018   Resolved Ambulatory Problems    Diagnosis Date Noted  . No Resolved Ambulatory Problems   Past Medical History:  Diagnosis Date  . Allergy   . Arthritis   . Back pain   . Bipolar disorder ([place])   . CAD (coronary artery disease)   . Chronic kidney disease   . Collagen vascular disease ([place])   . Colon polyp   . COPD (chronic obstructive pulmonary disease) ([place])   . Falls   . GERD (gastroesophageal reflux disease)   . Hyperlipidemia   . IBS (irritable bowel syndrome)   . Kidney disease   . Migraine   . Numbness and tingling   . Renal insufficiency   . Spinal stenosis   . Static encephalopathy   . Thyroid nodule 07/2017  . TIA (transient ischemic attack)    Constitutional Exam  General appearance: Well nourished, well developed, and well hydrated. In no apparent acute distress Vitals:   03/17/18 1020  BP: (!) 125/101  Pulse: 92  Resp: 18  Temp: 98 F (36.7 C)  TempSrc: Oral  SpO2: 99%  Weight: 202 lb (91.6 kg)  Height: '5\' 8"'$  (1.727 m)   BMI Assessment: Estimated body mass index is 30.71 kg/m as calculated from the following:   Height as of this encounter: '5\' 8"'$  (1.727 m).   Weight as of this encounter: 202 lb (91.6 kg).  BMI interpretation table: BMI level Category Range association with higher incidence of chronic pain  <18 kg/m2 Underweight   18.5-24.9 kg/m2 Ideal body weight   25-29.9 kg/m2 Overweight Increased incidence by 20%  30-34.9 kg/m2 Obese  (Class I) Increased incidence by 68%  35-39.9 kg/m2 Severe obesity (Class II) Increased incidence by 136%  >40 kg/m2 Extreme obesity (Class III) Increased incidence by 254%   BMI Readings from Last 4 Encounters:  03/17/18 30.71 kg/m  12/24/17 30.41 kg/m  07/17/17 29.80 kg/m  07/10/17 29.80 kg/m   Wt Readings from Last 4 Encounters:  03/17/18 202 lb (91.6 kg)  12/24/17 200 lb (90.7 kg)  07/17/17 196 lb (88.9 kg)  07/10/17 196 lb (88.9 kg)  Psych/Mental status: Alert, oriented x 3 (person, place, & time)       Eyes: PERLA Respiratory: No evidence of acute respiratory distress  Cervical Spine Exam  Inspection: No masses, redness, or swelling Alignment: Symmetrical Functional ROM: Unrestricted ROM      Stability: No instability detected Muscle strength & Tone: Functionally intact Sensory: Unimpaired Palpation: No palpable anomalies              Upper Extremity (UE) Exam    Side: Right upper extremity  Side: Left upper extremity  Inspection: No masses, redness, swelling, or asymmetry. No contractures  Inspection: No masses, redness, swelling, or asymmetry. No contractures  Functional ROM: Unrestricted ROM          Functional ROM: Unrestricted ROM          Muscle strength & Tone: Functionally intact  Muscle strength & Tone: Functionally intact  Sensory: Unimpaired  Sensory: Unimpaired  Palpation: No palpable anomalies              Palpation: No palpable anomalies              Specialized Test(s): Deferred         Specialized Test(s):  Deferred          Thoracic Spine Exam  Inspection: No masses, redness, or swelling Alignment: Symmetrical Functional ROM: Unrestricted ROM Stability: No instability detected Sensory: Unimpaired Muscle strength & Tone: Complains of area being tender to palpation  Lumbar Spine Exam  Inspection: No masses, redness, or swelling Alignment: Symmetrical Functional ROM: Decreased ROM      Stability: No instability detected Muscle strength & Tone:  Functionally intact Sensory: Unimpaired Palpation: Complains of area being tender to palpation       Provocative Tests: Lumbar Hyperextension and rotation test: Positive bilaterally for facet joint pain.bilateral leg raises less than 10 degrees Patrick's Maneuver: Unable to perform                  Due to pain  Gait & Posture Assessment  Ambulation: Patient ambulates using a wheel chair cane for ambulation Gait: Relatively normal for age and body habitus Posture: Poor   Lower Extremity Exam    Side: Right lower extremity  Side: Left lower extremity  Inspection: No masses, redness, swelling, or asymmetry. No contractures  Inspection: No masses, redness, swelling, or asymmetry. No contractures  Functional ROM: Unrestricted ROM          Functional ROM: Unrestricted ROM          Muscle strength & Tone: Functionally intact  Muscle strength & Tone: Functionally intact  Sensory: Unimpaired  Sensory: Unimpaired  Palpation: No palpable anomalies  Palpation: Complains of area being tender to palpation   Assessment  Primary Diagnosis & Pertinent Problem List: The primary encounter diagnosis was Chronic bilateral low back pain with bilateral sciatica (Primary Area of Pain) (L>R). Diagnoses of Chronic pain of both lower extremities (Secondary Area of Pain) (L>R), Chronic upper back pain (Tertiary Area of Pain) (R>L), Pain in both hands (Fourth Area of Pain) (R>L), Chronic pain of left knee, Chronic pain syndrome, Pharmacologic therapy, Disorder of skeletal system, Problems influencing health status, and Long term current use of opiate analgesic were also pertinent to this visit.  Visit Diagnosis: 1. Chronic bilateral low back pain with bilateral sciatica (Primary Area of Pain) (L>R)   2. Chronic pain of both lower extremities (Secondary Area of Pain) (L>R)   3. Chronic upper back pain [facility] [facility]) (R>L)   4. Pain in both hands (Fourth Area of Pain) (R>L)   5. Chronic pain of left knee    6. Chronic pain syndrome   7. Pharmacologic therapy   8. Disorder of skeletal system   9. Problems influencing health status   10. Long term current use of opiate analgesic    Plan of Care  Initial treatment plan:  Please be advised that as per protocol, today's visit has been an evaluation only. We have not taken over the patient's controlled substance management.  Problem-specific plan: No problem-specific Assessment & Plan notes found for this encounter.  Ordered Lab-work, Procedure(s), Referral(s), & Consult(s): Orders Placed This Encounter  Procedures  . DG Thoracic Spine 2 View  . DG Knee 1-2 Views Left  . Compliance Drug Analysis, Ur  . Comp. Metabolic Panel (12)  . Magnesium  . Vitamin B12  . Sedimentation rate  . 25-Hydroxyvitamin D Lcms D2+D3  . C-reactive protein   Pharmacotherapy: Medications ordered:  No orders of the defined types were placed in this encounter.  Medications administered during this visit: Miral Hoopes. Wynder had no medications administered during this visit.   Pharmacotherapy under consideration:  Opioid Analgesics: The patient  was informed that there is no guarantee that she would be a candidate for opioid analgesics. The decision will be made following CDC guidelines. This decision will be based on the results of diagnostic studies, as well as Ms. Tonner's risk profile.  Membrane stabilizer: To be determined at a later time Muscle relaxant: To be determined at a later time NSAID: To be determined at a later time Other analgesic(s): To be determined at a later time   Interventional therapies under consideration: Ms. Adelstein was informed that there is no guarantee that she would be a candidate for interventional therapies. The decision will be based on the results of diagnostic studies, as well as Ms. Gladman's risk profile.  Possible procedure(s): Diagnostic midline lumbar epidural steroid injection Diagnostic bilateral lumbar facet nerve  block Possible bilateral lumbar facet radiofrequency ablation Diagnostic bilateral thoracic facet nerve block Possible bilateral thoracic radiofrequency ablation Diagnostic left intra-articular knee injection   Provider-requested follow-up: Return for 2nd Visit, w/ Dr. Dossie Arbour.  Future Appointments  Date Time Provider [place]  04/07/2018  9:30 AM Milinda Pointer, MD [facility] None    Primary Care Physician: Tracie Harrier, MD Location: [facility] Note by:  Date: 03/17/2018; Time: 2:47 PM  Pain Score Disclaimer: We use the NRS-11 scale. This is a self-reported, subjective measurement of pain severity with only modest accuracy. It is used primarily to identify changes within a particular patient. It must be understood that outpatient pain scales are significantly less accurate that those used for research, where they can be applied under ideal controlled circumstances with minimal exposure to variables. In reality, the score is likely to be a combination of pain intensity and pain affect, where pain affect describes the degree of emotional arousal or changes in action readiness caused by the sensory experience of pain. Factors such as social and work situation, setting, emotional state, anxiety levels, expectation, and prior pain experience may influence pain perception and show large inter-individual differences that may also be affected by time variables.  Patient instructions provided during this appointment: Patient Instructions   ____________________________________________________________________________________________  Appointment Policy Summary  It is our goal and responsibility to provide the medical community with assistance in the evaluation and management of patients with chronic pain. Unfortunately our resources are limited. Because we do not have an unlimited amount of time, or available appointments, we are required to closely  monitor and manage their use. The following rules exist to maximize their use:  Patient's responsibilities: 1. Punctuality:  At what time should I arrive? You should be physically present in our office 30 minutes before your scheduled appointment. Your scheduled appointment is with your assigned healthcare provider. However, it takes 5-10 minutes to be "checked-in", and another 15 minutes for the nurses to do the admission. If you arrive to our office at the time you were given for your appointment, you will end up being at least 20-25 minutes late to your appointment with the provider. 2. Tardiness:  What happens if I arrive only a few minutes after my scheduled appointment time? You will need to reschedule your appointment. The cutoff is your appointment time. This is why it is so important that you arrive at least 30 minutes before that appointment. If you have an appointment scheduled for 10:00 AM and you arrive at 10:01, you will be required to reschedule your appointment.  3. Plan ahead:  Always assume that you will encounter traffic on your way in. Plan for it. If you are dependent  on a driver, make sure they understand these rules and the need to arrive early. 4. Other appointments and responsibilities:  Avoid scheduling any other appointments before or after your pain clinic appointments.  5. Be prepared:  Write down everything that you need to discuss with your healthcare provider and give this information to the admitting nurse. Write down the medications that you will need refilled. Bring your pills and bottles (even the empty ones), to all of your appointments, except for those where a procedure is scheduled. 6. No children or pets:  Find someone to take care of them. It is not appropriate to bring them in. 7. Scheduling changes:  We request "advanced notification" of any changes or cancellations. 8. Advanced notification:  Defined as a time period of more than 24 hours prior to the  originally scheduled appointment. This allows for the appointment to be offered to other patients. 9. Rescheduling:  When a visit is rescheduled, it will require the cancellation of the original appointment. For this reason they both fall within the category of "Cancellations".  10. Cancellations:  They require advanced notification. Any cancellation less than 24 hours before the  appointment will be recorded as a "No Show". 11. No Show:  Defined as an unkept appointment where the patient failed to notify or declare to the practice their intention or inability to keep the appointment.  Corrective process for repeat offenders:  1. Tardiness: Three (3) episodes of rescheduling due to late arrivals will be recorded as one (1) "No Show". 2. Cancellation or reschedule: Three (3) cancellations or rescheduling will be recorded as one (1) "No Show". 3. "No Shows": Three (3) "No Shows" within a 12 month period will result in discharge from the practice. ____________________________________________________________________________________________  ____________________________________________________________________________________________  Pain Scale  Introduction: The pain score used by this practice is the Verbal Numerical Rating Scale (VNRS-11). This is an 11-point scale. It is for adults and children 10 years or older. There are significant differences in how the pain score is reported, used, and applied. Forget everything you learned in the past and learn this scoring system.  General Information: The scale should reflect your current level of pain. Unless you are specifically asked for the level of your worst pain, or your average pain. If you are asked for one of these two, then it should be understood that it is over the past 24 hours.  Basic Activities of Daily Living (ADL): Personal hygiene, dressing, eating, transferring, and using restroom.  Instructions: Most patients tend to report their  level of pain as a combination of two factors, their physical pain and their psychosocial pain. This last one is also known as "suffering" and it is reflection of how physical pain affects you socially and psychologically. From now on, report them separately. From this point on, when asked to report your pain level, report only your physical pain. Use the following table for reference.  Pain Clinic Pain Levels (0-5/10)  Pain Level Score  Description  No Pain 0   Mild pain 1 Nagging, annoying, but does not interfere with basic activities of daily living (ADL). Patients are able to eat, bathe, get dressed, toileting (being able to get on and off the toilet and perform personal hygiene functions), transfer (move in and out of bed or a chair without assistance), and maintain continence (able to control bladder and bowel functions). Blood pressure and heart rate are unaffected. A normal heart rate for a healthy adult ranges from 60 to 100 bpm (  beats per minute).   Mild to moderate pain 2 Noticeable and distracting. Impossible to hide from other people. More frequent flare-ups. Still possible to adapt and function close to normal. It can be very annoying and may have occasional stronger flare-ups. With discipline, patients may get used to it and adapt.   Moderate pain 3 Interferes significantly with activities of daily living (ADL). It becomes difficult to feed, bathe, get dressed, get on and off the toilet or to perform personal hygiene functions. Difficult to get in and out of bed or a chair without assistance. Very distracting. With effort, it can be ignored when deeply involved in activities.   Moderately severe pain 4 Impossible to ignore for more than a few minutes. With effort, patients may still be able to manage work or participate in some social activities. Very difficult to concentrate. Signs of autonomic nervous system discharge are evident: dilated pupils (mydriasis); mild sweating (diaphoresis);  sleep interference. Heart rate becomes elevated (>115 bpm). Diastolic blood pressure (lower number) rises above 100 mmHg. Patients find relief in laying down and not moving.   Severe pain 5 Intense and extremely unpleasant. Associated with frowning face and frequent crying. Pain overwhelms the senses.  Ability to do any activity or maintain social relationships becomes significantly limited. Conversation becomes difficult. Pacing back and forth is common, as getting into a comfortable position is nearly impossible. Pain wakes you up from deep sleep. Physical signs will be obvious: pupillary dilation; increased sweating; goosebumps; brisk reflexes; cold, clammy hands and feet; nausea, vomiting or dry heaves; loss of appetite; significant sleep disturbance with inability to fall asleep or to remain asleep. When persistent, significant weight loss is observed due to the complete loss of appetite and sleep deprivation.  Blood pressure and heart rate becomes significantly elevated. Caution: If elevated blood pressure triggers a pounding headache associated with blurred vision, then the patient should immediately seek attention at an urgent or emergency care unit, as these may be signs of an impending stroke.    Emergency Department Pain Levels (6-10/10)  Emergency Room Pain 6 Severely limiting. Requires emergency care and should not be seen or managed at an outpatient pain management facility. Communication becomes difficult and requires great effort. Assistance to reach the emergency department may be required. Facial flushing and profuse sweating along with potentially dangerous increases in heart rate and blood pressure will be evident.   Distressing pain 7 Self-care is very difficult. Assistance is required to transport, or use restroom. Assistance to reach the emergency department will be required. Tasks requiring coordination, such as bathing and getting dressed become very difficult.   Disabling pain 8  Self-care is no longer possible. At this level, pain is disabling. The individual is unable to do even the most "basic" activities such as walking, eating, bathing, dressing, transferring to a bed, or toileting. Fine motor skills are lost. It is difficult to think clearly.   Incapacitating pain 9 Pain becomes incapacitating. Thought processing is no longer possible. Difficult to remember your own name. Control of movement and coordination are lost.   The worst pain imaginable 10 At this level, most patients pass out from pain. When this level is reached, collapse of the autonomic nervous system occurs, leading to a sudden drop in blood pressure and heart rate. This in turn results in a temporary and dramatic drop in blood flow to the brain, leading to a loss of consciousness. Fainting is one of the body's self defense mechanisms. Passing out puts the  brain in a calmed state and causes it to shut down for a while, in order to begin the healing process.    Summary: 1. Refer to this scale when providing [place] with your pain level. 2. Be accurate and careful when reporting your pain level. This will help with your care. 3. Over-reporting your pain level will lead to loss of credibility. 4. Even a level of 1/10 means that there is pain and will be treated at our facility. 5. High, inaccurate reporting will be documented as "Symptom Exaggeration", leading to loss of credibility and suspicions of possible secondary gains such as obtaining more narcotics, or wanting to appear disabled, for fraudulent reasons. 6. Only pain levels of 5 or below will be seen at our facility. 7. Pain levels of 6 and above will be sent to the Emergency Department and the appointment cancelled. ____________________________________________________________________________________________   BMI Assessment: Estimated body mass index is 30.71 kg/m as calculated from the following:   Height as of this encounter: '5\' 8"'$  (1.727 m).    Weight as of this encounter: 202 lb (91.6 kg).  BMI interpretation table: BMI level Category Range association with higher incidence of chronic pain  <18 kg/m2 Underweight   18.5-24.9 kg/m2 Ideal body weight   25-29.9 kg/m2 Overweight Increased incidence by 20%  30-34.9 kg/m2 Obese (Class I) Increased incidence by 68%  35-39.9 kg/m2 Severe obesity (Class II) Increased incidence by 136%  >40 kg/m2 Extreme obesity (Class III) Increased incidence by 254%      BMI Readings from Last 4 Encounters:  03/17/18 30.71 kg/m  12/24/17 30.41 kg/m  07/17/17 29.80 kg/m  07/10/17 29.80 kg/m      Wt Readings from Last 4 Encounters:  03/17/18 202 lb (91.6 kg)  12/24/17 200 lb (90.7 kg)  07/17/17 196 lb (88.9 kg)  07/10/17 196 lb (88.9 kg)

## 2018-03-21 LAB — COMP. METABOLIC PANEL (12)
A/G RATIO: 2 (ref 1.2–2.2)
AST: 13 IU/L (ref 0–40)
Albumin: 4.4 g/dL (ref 3.5–5.5)
Alkaline Phosphatase: 148 IU/L — ABNORMAL HIGH (ref 39–117)
BILIRUBIN TOTAL: 0.3 mg/dL (ref 0.0–1.2)
BUN/Creatinine Ratio: 16 (ref 9–23)
BUN: 22 mg/dL (ref 6–24)
CHLORIDE: 99 mmol/L (ref 96–106)
CREATININE: 1.35 mg/dL — AB (ref 0.57–1.00)
Calcium: 9.1 mg/dL (ref 8.7–10.2)
GFR calc Af Amer: 51 mL/min/{1.73_m2} — ABNORMAL LOW (ref >59)
GFR calc non Af Amer: 44 mL/min/{1.73_m2} — ABNORMAL LOW (ref >59)
Globulin, Total: 2.2 g/dL (ref 1.5–4.5)
Glucose: 97 mg/dL (ref 65–99)
Potassium: 4.3 mmol/L (ref 3.5–5.2)
Sodium: 141 mmol/L (ref 134–144)
TOTAL PROTEIN: 6.6 g/dL (ref 6.0–8.5)

## 2018-03-21 LAB — 25-HYDROXYVITAMIN D LCMS D2+D3: 25-HYDROXY, VITAMIN D-3: 41 ng/mL

## 2018-03-21 LAB — SEDIMENTATION RATE: Sed Rate: 28 mm/hr (ref 0–40)

## 2018-03-21 LAB — C-REACTIVE PROTEIN: CRP: 17 mg/L — AB (ref 0–10)

## 2018-03-21 LAB — 25-HYDROXY VITAMIN D LCMS D2+D3
25-Hydroxy, Vitamin D-2: <1 ng/mL
25-Hydroxy, Vitamin D: 41 ng/mL

## 2018-03-21 LAB — MAGNESIUM: MAGNESIUM: 1.8 mg/dL (ref 1.6–2.3)

## 2018-03-21 LAB — VITAMIN B12: Vitamin B-12: 268 pg/mL (ref 232–1245)

## 2018-03-22 LAB — COMPLIANCE DRUG ANALYSIS, UR

## 2018-04-03 DIAGNOSIS — M4310 Spondylolisthesis, site unspecified: Secondary | ICD-10-CM | POA: Insufficient documentation

## 2018-04-03 DIAGNOSIS — R937 Abnormal findings on diagnostic imaging of other parts of musculoskeletal system: Secondary | ICD-10-CM | POA: Insufficient documentation

## 2018-04-03 DIAGNOSIS — M47816 Spondylosis without myelopathy or radiculopathy, lumbar region: Secondary | ICD-10-CM | POA: Insufficient documentation

## 2018-04-03 DIAGNOSIS — M4802 Spinal stenosis, cervical region: Secondary | ICD-10-CM | POA: Insufficient documentation

## 2018-04-03 DIAGNOSIS — M48061 Spinal stenosis, lumbar region without neurogenic claudication: Secondary | ICD-10-CM | POA: Insufficient documentation

## 2018-04-03 DIAGNOSIS — M5136 Other intervertebral disc degeneration, lumbar region: Secondary | ICD-10-CM | POA: Insufficient documentation

## 2018-04-03 DIAGNOSIS — M47812 Spondylosis without myelopathy or radiculopathy, cervical region: Secondary | ICD-10-CM | POA: Insufficient documentation

## 2018-04-03 NOTE — Progress Notes (Deleted)
Patient's Name: Samantha Macias  MRN: [id number]  Referring Provider: Tracie Harrier, MD  DOB: [birth year]  PCP: Tracie Harrier, MD  DOS: 04/07/2018  Note by: Gaspar Cola, MD  Service setting: Ambulatory outpatient  Specialty: Interventional Pain Management  Location: [facility] (AMB) Pain Management Facility    Patient type: Established   Primary Reason(s) for Visit: Encounter for evaluation before starting new chronic pain management plan of care (Level of risk: moderate) CC: No chief complaint on file.  HPI  Samantha Macias is a 57 y.o. year old, female patient, who comes today for a follow-up evaluation to review the test results and decide on a treatment plan. She has Allergic rhinitis; Anemia in chronic illness; GAD (generalized anxiety disorder); Bipolar affective disorder, currently depressed, mild ([place]); Atherosclerosis of left carotid artery; Bilateral carpal tunnel syndrome; Chronic kidney disease (CKD), stage III (moderate) ([place]); Insomnia, persistent; Chronic pain syndrome; CN (constipation); Kidney cysts; Cervical osteoarthritis; Dyslipidemia; Fibromyalgia syndrome; Abnormal gait; Gastro-esophageal reflux disease without esophagitis; History of colon polyps; History of syncope; H/O transient cerebral ischemia; HPV test positive; Recurrent falls; Calculus of kidney; Lumbar radiculopathy; OCD (obsessive compulsive disorder); Overweight; PTSD (post-traumatic stress disorder); Trochanteric bursitis of hip (Bilateral); Hyperparathyroidism, secondary renal ([place]); Type III hyperlipoproteinemia; Hypertension, benign; Osteoarthritis of both hands; Thyromegaly; Thyroid cyst; Connective tissue disease ([place]); Arthralgia of multiple joints; Vitamin D deficiency; HLD (hyperlipidemia); Anxiety; Acute pain of left knee; Anterior chest wall pain; Cardiac syncope; Chest pain; Precordial pain; Cigarette nicotine dependence without complication; COPD, moderate ([place]); Left hip pain; Lightheadedness; Loss of  memory; Mild cognitive disorder; Mild cognitive impairment, so stated; Sleeping difficulties; Tobacco user; Cervical myelopathy ([place]); DDD (degenerative disc disease), cervical; Cervical spondylosis with myelopathy; Hypertension; Insomnia secondary to depression with anxiety; Chronic low back pain (Primary Area of Pain) (Bilateral) (L>R) w/ sciatica; Chronic lower extremity pain (Secondary Area of Pain) (Bilateral) (L>R); Chronic upper back pain [facility] Area of Pain) (Bilateral) (R>L); Chronic hand pain (Fourth Area of Pain) (Bilateral) (R>L); Chronic knee pain (Left); Pharmacologic therapy; Disorder of skeletal system; Problems influencing health status; Long term current use of opiate analgesic; Abnormal MRI, cervical spine; Abnormal MRI, lumbar spine; DDD (degenerative disc disease), lumbar; Lumbar facet hypertrophy (Bilateral); Lumbar facet syndrome (Bilateral); Cervical facet hypertrophy (Bilateral); Cervical facet syndrome (Bilateral); Cervical foraminal stenosis; Lumbar foraminal stenosis; Cervical central spinal stenosis; Lumbar central spinal stenosis; Anterolisthesis; and Retrolisthesis on their problem list. Her primarily concern today is the No chief complaint on file.  Pain Assessment: Location:     Radiating:   Onset:   Duration:   Quality:   Severity:  /10 (subjective, self-reported pain score)  Note: Reported level is compatible with observation.                         When using our objective Pain Scale, levels between 6 and 10/10 are said to belong in an emergency room, as it progressively worsens from a 6/10, described as severely limiting, requiring emergency care not usually available at an outpatient pain management facility. At a 6/10 level, communication becomes difficult and requires great effort. Assistance to reach the emergency department may be required. Facial flushing and profuse sweating along with potentially dangerous increases in heart rate and blood pressure will be  evident. Effect on ADL:   Timing:   Modifying factors:   BP:    HR:    Samantha Macias comes in today for a follow-up visit after her initial evaluation on 03/17/2018. Today we went  over the results of her tests. These were explained in "[place]man's terms". During today's appointment we went over my diagnostic impression, as well as the proposed treatment plan.  ***  In considering the treatment plan options, Samantha Macias was reminded that I no longer take patients for medication management only. I asked her to let me know if she had no intention of taking advantage of the interventional therapies, so that we could make arrangements to provide this space to someone interested. I also made it clear that undergoing interventional therapies for the purpose of getting pain medications is very inappropriate on the part of a patient, and it will not be tolerated in this practice. This type of behavior would suggest true addiction and therefore it requires referral to an addiction specialist.   Further details on both, my assessment(s), as well as the proposed treatment plan, please see below.  Controlled Substance Pharmacotherapy Assessment REMS (Risk Evaluation and Mitigation Strategy)  Analgesic: ***  MME/day: *** mg/day. Pill Count: None expected due to no prior prescriptions written by our practice. No notes on file Pharmacokinetics: Liberation and absorption (onset of action): WNL Distribution (time to peak effect): WNL Metabolism and excretion (duration of action): WNL         Pharmacodynamics: Desired effects: Analgesia: Samantha Macias reports >50% benefit. Functional ability: Patient reports that medication allows her to accomplish basic ADLs Clinically meaningful improvement in function (CMIF): Sustained CMIF goals met Perceived effectiveness: Described as relatively effective, allowing for increase in activities of daily living (ADL) Undesirable effects: Side-effects or Adverse reactions: None  reported Monitoring: [place] PMP: Online review of the past 56-monthperiod previously conducted. Not applicable at this point since we have not taken over the patient's medication management yet. List of other Serum/Urine Drug Screening Test(s):  Lab Results  Component Value Date   COCAINSCRNUR POSITIVE (A) 08/07/2017   COCAINSCRNUR POSITIVE (A) 07/17/2017   THCU NONE DETECTED 08/07/2017   THCU NONE DETECTED 07/17/2017   List of all UDS test(s) done:  Lab Results  Component Value Date   SUMMARY FINAL 03/17/2018   Last UDS on record: Summary  Date Value Ref Range Status  03/17/2018 FINAL  Final    Comment:    ==================================================================== TOXAS[facility]RE COMP DRUG ANALYSIS,UR ==================================================================== Test                             Result       Flag       Units Drug Present and Declared for Prescription Verification   Tramadol                       1825         EXPECTED   ng/mg creat   O-Desmethyltramadol            929          EXPECTED   ng/mg creat   N-Desmethyltramadol            1352         EXPECTED   ng/mg creat    Source of tramadol is a prescription medication.    O-desmethyltramadol and N-desmethyltramadol are expected    metabolites of tramadol.   Pregabalin                     PRESENT      EXPECTED   Tizanidine  PRESENT      EXPECTED   Bupropion                      PRESENT      EXPECTED   Hydroxybupropion               PRESENT      EXPECTED    Hydroxybupropion is an expected metabolite of bupropion.   Mirtazapine                    PRESENT      EXPECTED   Quetiapine                     PRESENT      EXPECTED   Salicylate                     PRESENT      EXPECTED Drug Present not Declared for Prescription Verification   Benzoylecgonine                494          UNEXPECTED ng/mg creat    Benzoylecgonine is a metabolite of cocaine; its presence    indicates use of this  drug.  Source is most commonly illicit, but    cocaine is present in some topical anesthetic solutions.   Acetaminophen                  PRESENT      UNEXPECTED   Diphenhydramine                PRESENT      UNEXPECTED Drug Absent but Declared for Prescription Verification   Alprazolam                     Not Detected UNEXPECTED ng/mg creat   Sertraline                     Not Detected UNEXPECTED   Diclofenac                     Not Detected UNEXPECTED    Topical diclofenac, as indicated in the declared medication list,    is not always detected even when used as directed.   Ibuprofen                      Not Detected UNEXPECTED    Ibuprofen, as indicated in the declared medication list, is not    always detected even when used as directed. ==================================================================== Test                      Result    Flag   Units      Ref Range   Creatinine              159              mg/dL      >=20 ==================================================================== Declared Medications:  The flagging and interpretation on this report are based on the  following declared medications.  Unexpected results may arise from  inaccuracies in the declared medications.  **Note: The testing scope of this panel includes these medications:  Alprazolam (Xanax)  Bupropion (Wellbutrin)  Mirtazapine (Remeron)  Pregabalin (Lyrica)  Quetiapine (Seroquel)  Sertraline (Zoloft)  Tramadol (Ultram)  **Note: The testing scope of this panel does not include small  to  moderate amounts of these reported medications:  Aspirin (Aspirin 81)  Ibuprofen  Tizanidine (Zanaflex)  Topical Diclofenac  **Note: The testing scope of this panel does not include following  reported medications:  Albuterol  Donepezil (Aricept)  Formoterol (Perforomist)  Hydrochlorothiazide (Hyzaar)  Ipratropium  Losartan (Hyzaar)  Omeprazole (Prilosec)  Pantoprazole (Protonix)  Ramelteon (Rozerem)   Tiotropium (Spiriva)  Vitamin D ==================================================================== For clinical consultation, please call [phone]. ====================================================================    UDS interpretation: No unexpected findings.          Medication Assessment Form: Patient introduced to form today Treatment compliance: Treatment may start today if patient agrees with proposed plan. Evaluation of compliance is not applicable at this point Risk Assessment Profile: Aberrant behavior: See initial evaluations. None observed or detected today Comorbid factors increasing risk of overdose: See initial evaluation. No additional risks detected today Opioid risk tool (ORT) (Total Score):   Personal History of Substance Abuse (SUD-Substance use disorder):  Alcohol:    Illegal Drugs:    Rx Drugs:    ORT Risk Level calculation:   Risk of substance use disorder (SUD): Low  ORT Scoring interpretation table:  Score <3 = Low Risk for SUD  Score between 4-7 = Moderate Risk for SUD  Score >8 = High Risk for Opioid Abuse   Risk Mitigation Strategies:  Patient opioid safety counseling: Completed today. Counseling provided to patient as per "Patient Counseling Document". Document signed by patient, attesting to counseling and understanding Patient-Prescriber Agreement (PPA): Obtained today.  Controlled substance notification to other providers: Written and sent today.  Pharmacologic Plan: Today we may be taking over the patient's pharmacological regimen. See below.             Laboratory Chemistry  Inflammation Markers (CRP: Acute Phase) (ESR: Chronic Phase) Lab Results  Component Value Date   CRP 17 (H) 03/17/2018   ESRSEDRATE 28 03/17/2018                         Rheumatology Markers No results found.  Renal Function Markers Lab Results  Component Value Date   BUN 22 03/17/2018   CREATININE 1.35 (H) 03/17/2018   BCR 16 03/17/2018   GFRAA 51 (L)  03/17/2018   GFRNONAA 44 (L) 03/17/2018                             Hepatic Function Markers Lab Results  Component Value Date   AST 13 03/17/2018   ALT 15 12/24/2017   ALBUMIN 4.4 03/17/2018   ALKPHOS 148 (H) 03/17/2018   LIPASE 37 12/24/2017   AMMONIA 23 07/07/2015                        Electrolytes Lab Results  Component Value Date   NA 141 03/17/2018   K 4.3 03/17/2018   CL 99 03/17/2018   CALCIUM 9.1 03/17/2018   MG 1.8 03/17/2018                        Neuropathy Markers Lab Results  Component Value Date   VITAMINB12 268 03/17/2018   HGBA1C 5.6 07/06/2015                        CNS Tests No results found.  Bone Pathology Markers Lab Results  Component Value Date   25OHVITD1 41 03/17/2018  25OHVITD2 <1.0 03/17/2018   25OHVITD3 41 03/17/2018                         Coagulation Parameters Lab Results  Component Value Date   INR 0.91 07/10/2017   LABPROT 12.2 07/10/2017   APTT 27 07/10/2017   PLT 224 12/24/2017                        Cardiovascular Markers Lab Results  Component Value Date   BNP 135.0 (H) 11/08/2016   CKTOTAL 47 02/10/2014   CKMB 0.6 02/10/2014   TROPONINI <0.03 11/08/2016   HGB 13.4 12/24/2017   HCT 38.5 12/24/2017                         CA Markers No results found.  Note: Lab results reviewed.  Recent Diagnostic Imaging Review  Cervical Imaging: Cervical MR wo contrast:  Results for orders placed during the hospital encounter of 05/17/17  MR CERVICAL SPINE WO CONTRAST   Narrative CLINICAL DATA:  58 year old female with chronic cervical neck pain. Recent pain radiation down the left arm with numbness from the elbow distally. Chronic lumbar back pain progressive for 1 month. Frequent falls. Painful sitting and standing straight.  EXAM: MRI CERVICAL AND LUMBAR SPINE WITHOUT CONTRAST  TECHNIQUE: Multiplanar and multiecho pulse sequences of the cervical spine, to include the craniocervical junction and  cervicothoracic junction, and lumbar spine, were obtained without intravenous contrast.  COMPARISON:  Brain MRI 07/27/2015. Cervical spine MRI 10/11/2009. Lumbar MRI 03/15/2016. CT Abdomen and Pelvis 12/22/2013.  FINDINGS: MRI CERVICAL SPINE FINDINGS  Alignment: Chronic straightening of cervical lordosis. Mild anterolisthesis of C3 on C4 appears stable but similar mild degenerative appearing anterolisthesis of C4 on C5 has developed since 2011. Subtle anterolisthesis of C5 on C6.  Vertebrae: There is mild degenerative appearing marrow edema in the C5 lamina posteriorly in the midline (series 5, image 7). This is new compared to 2011. Background bone marrow signal is normal. Chronic but increased degenerative endplate irregularity inferiorly at C6. No other marrow edema or acute osseous abnormality.  Cord: Spinal cord signal is within normal limits at all visualized levels.  Posterior Fossa, vertebral arteries, paraspinal tissues: Negative visualized posterior fossa. Cervicomedullary junction is within normal limits. Preserved major vascular flow voids in the neck. Negative neck soft tissues.  Disc levels:  C2-C3: Moderate to severe facet hypertrophy greater on the right has developed since 2011. Right foraminal disc bulge and endplate spurring. No spinal stenosis, but moderate right C3 neural foraminal stenosis is new from the prior MRI.  C3-C4: Moderate to severe facet hypertrophy greater on the right has progressed since 2011. Mild foraminal disc bulging and endplate spurring greater on the right. Mild left and moderate to severe right C4 foraminal stenosis have increased since 2011.  C4-C5: Chronic left side facet hypertrophy, but moderate to severe facet degeneration has progressed since 2011. Left greater than right foraminal disc bulging and endplate spurring. Mild left ligament flavum hypertrophy. No spinal stenosis. Severe left and mild to moderate right C5  foraminal stenosis have progressed.  C5-C6: Mild facet hypertrophy has increased on the right. Mild circumferential disc bulge with foraminal disc and endplate spurring. No spinal stenosis. Increased borderline to mild left and moderate right C6 foraminal stenosis.  C6-C7: Disc space loss since 2011. Circumferential disc bulge and endplate spurring with broad-based posterior and biforaminal component. Mild ligament flavum hypertrophy.  Mild spinal stenosis. Borderline to mild spinal cord mass effect. Moderate to severe bilateral C7 neural foraminal stenosis. This level has progressed since 2011.  C7-T1: Progressed moderate facet hypertrophy greater on the left. Mild foraminal endplate spurring. Increased mild left C8 foraminal stenosis.  No upper thoracic spinal or foraminal stenosis.  MRI LUMBAR SPINE FINDINGS  Segmentation: Normal as demonstrated on the 2015 CT, which is the same numbering system used on the 2017 MRI.  Alignment: Stable vertebral height and alignment since 2017. Mild retrolisthesis at L5-S1. Relatively preserved lumbar lordosis; mild reversal at L1-L2.  Vertebrae: Chronic but perhaps mildly increased degenerative appearing endplate marrow edema posteriorly and to the left at L5-S1.  New degenerative appearing marrow edema throughout the chronically degenerated right L4 and L5 facets (with some pedicle involvement). See series 12, images 3 and 4).  Background bone marrow signal remains normal. There is trace degenerative endplate edema anteriorly superiorly at L3. No other acute osseous abnormality. Intact visible sacrum and SI joints.  Conus medullaris and cauda equina: Conus extends to the T12-L1 level. Conus and cauda equina appear normal. Fairly capacious spinal canal.  Paraspinal and other soft tissues: Negative.  Disc levels:  T12-L1:  Negative.  L1-L2: Chronic disc desiccation and disc space loss. Stable mild right paracentral disc  protrusion. No stenosis.  L2-L3: Mild chronic disc bulge, most affecting the left neural foramen. Small left foraminal annular fissure of the disc is increased in conspicuity on series 10, image 12. Stable mild facet hypertrophy. Mild left L2 neural foraminal stenosis is stable.  L3-L4: Chronic disc desiccation and mostly far lateral disc bulging. Mild facet hypertrophy. No stenosis.  L4-L5: Chronic circumferential disc bulge. Broad-based bilateral foraminal involvement with endplate spurring far laterally on the right. Possible chronic left foraminal annular fissure. Moderate to severe facet hypertrophy. Increased bilateral facet joint fluid, greater on the right. Borderline to mild spinal stenosis and right lateral recess stenosis (descending right L5 nerve level). Mild bilateral L4 foraminal stenosis. This level has progressed since 2017.  L5-S1: Chronic circumferential disc bulge and endplate spurring eccentric to the left and most affecting the left neural foramen. Mild to moderate facet hypertrophy is stable. Increased mild epidural lipomatosis. No spinal or lateral recess stenosis. Moderate left L5 foraminal stenosis is stable.  IMPRESSION: CERVICAL SPINE:  1. Progressed cervical spine degeneration since 2011. Multilevel mild spondylolisthesis associated with increased moderate to severe facet hypertrophy at multiple levels. Associated degenerative appearing mild marrow edema in the right C5 lamina. Subsequent new or increased neural foraminal stenosis which is moderate or severe at the right C3, right C4, left C5, right C6 and bilateral C7 nerve levels. 2. Multifactorial mild cervical spinal stenosis at C6-C7 with up to mild spinal cord mass effect, and the bilateral C7 foraminal stenosis. No spinal cord signal abnormality.  LUMBAR SPINE:  1. Chronic facet degeneration at L4 and L5 has progressed since the 2017 MRI and is severe, with associated new degenerative  marrow edema. Increased multifactorial mild spinal, right lateral recess, and bilateral neural foraminal stenosis at that level. 2. Other lumbar levels appear stable since 2017, including chronic disc and endplate degeneration eccentric to the left at L5-S1.   Electronically Signed   By: Genevie Ann M.D.   On: 05/17/2017 12:17    Lumbosacral Imaging: Lumbar MR wo contrast:  Results for orders placed during the hospital encounter of 05/17/17  MR LUMBAR SPINE WO CONTRAST   Narrative CLINICAL DATA:  57 year old female with chronic cervical neck pain. Recent  pain radiation down the left arm with numbness from the elbow distally. Chronic lumbar back pain progressive for 1 month. Frequent falls. Painful sitting and standing straight.  EXAM: MRI CERVICAL AND LUMBAR SPINE WITHOUT CONTRAST  TECHNIQUE: Multiplanar and multiecho pulse sequences of the cervical spine, to include the craniocervical junction and cervicothoracic junction, and lumbar spine, were obtained without intravenous contrast.  COMPARISON:  Brain MRI 07/27/2015. Cervical spine MRI 10/11/2009. Lumbar MRI 03/15/2016. CT Abdomen and Pelvis 12/22/2013.  FINDINGS: MRI CERVICAL SPINE FINDINGS  Alignment: Chronic straightening of cervical lordosis. Mild anterolisthesis of C3 on C4 appears stable but similar mild degenerative appearing anterolisthesis of C4 on C5 has developed since 2011. Subtle anterolisthesis of C5 on C6.  Vertebrae: There is mild degenerative appearing marrow edema in the C5 lamina posteriorly in the midline (series 5, image 7). This is new compared to 2011. Background bone marrow signal is normal. Chronic but increased degenerative endplate irregularity inferiorly at C6. No other marrow edema or acute osseous abnormality.  Cord: Spinal cord signal is within normal limits at all visualized levels.  Posterior Fossa, vertebral arteries, paraspinal tissues: Negative visualized posterior fossa.  Cervicomedullary junction is within normal limits. Preserved major vascular flow voids in the neck. Negative neck soft tissues.  Disc levels:  C2-C3: Moderate to severe facet hypertrophy greater on the right has developed since 2011. Right foraminal disc bulge and endplate spurring. No spinal stenosis, but moderate right C3 neural foraminal stenosis is new from the prior MRI.  C3-C4: Moderate to severe facet hypertrophy greater on the right has progressed since 2011. Mild foraminal disc bulging and endplate spurring greater on the right. Mild left and moderate to severe right C4 foraminal stenosis have increased since 2011.  C4-C5: Chronic left side facet hypertrophy, but moderate to severe facet degeneration has progressed since 2011. Left greater than right foraminal disc bulging and endplate spurring. Mild left ligament flavum hypertrophy. No spinal stenosis. Severe left and mild to moderate right C5 foraminal stenosis have progressed.  C5-C6: Mild facet hypertrophy has increased on the right. Mild circumferential disc bulge with foraminal disc and endplate spurring. No spinal stenosis. Increased borderline to mild left and moderate right C6 foraminal stenosis.  C6-C7: Disc space loss since 2011. Circumferential disc bulge and endplate spurring with broad-based posterior and biforaminal component. Mild ligament flavum hypertrophy. Mild spinal stenosis. Borderline to mild spinal cord mass effect. Moderate to severe bilateral C7 neural foraminal stenosis. This level has progressed since 2011.  C7-T1: Progressed moderate facet hypertrophy greater on the left. Mild foraminal endplate spurring. Increased mild left C8 foraminal stenosis.  No upper thoracic spinal or foraminal stenosis.  MRI LUMBAR SPINE FINDINGS  Segmentation: Normal as demonstrated on the 2015 CT, which is the same numbering system used on the 2017 MRI.  Alignment: Stable vertebral height and alignment  since 2017. Mild retrolisthesis at L5-S1. Relatively preserved lumbar lordosis; mild reversal at L1-L2.  Vertebrae: Chronic but perhaps mildly increased degenerative appearing endplate marrow edema posteriorly and to the left at L5-S1.  New degenerative appearing marrow edema throughout the chronically degenerated right L4 and L5 facets (with some pedicle involvement). See series 12, images 3 and 4).  Background bone marrow signal remains normal. There is trace degenerative endplate edema anteriorly superiorly at L3. No other acute osseous abnormality. Intact visible sacrum and SI joints.  Conus medullaris and cauda equina: Conus extends to the T12-L1 level. Conus and cauda equina appear normal. Fairly capacious spinal canal.  Paraspinal and other soft tissues:  Negative.  Disc levels:  T12-L1:  Negative.  L1-L2: Chronic disc desiccation and disc space loss. Stable mild right paracentral disc protrusion. No stenosis.  L2-L3: Mild chronic disc bulge, most affecting the left neural foramen. Small left foraminal annular fissure of the disc is increased in conspicuity on series 10, image 12. Stable mild facet hypertrophy. Mild left L2 neural foraminal stenosis is stable.  L3-L4: Chronic disc desiccation and mostly far lateral disc bulging. Mild facet hypertrophy. No stenosis.  L4-L5: Chronic circumferential disc bulge. Broad-based bilateral foraminal involvement with endplate spurring far laterally on the right. Possible chronic left foraminal annular fissure. Moderate to severe facet hypertrophy. Increased bilateral facet joint fluid, greater on the right. Borderline to mild spinal stenosis and right lateral recess stenosis (descending right L5 nerve level). Mild bilateral L4 foraminal stenosis. This level has progressed since 2017.  L5-S1: Chronic circumferential disc bulge and endplate spurring eccentric to the left and most affecting the left neural foramen. Mild to  moderate facet hypertrophy is stable. Increased mild epidural lipomatosis. No spinal or lateral recess stenosis. Moderate left L5 foraminal stenosis is stable.  IMPRESSION: CERVICAL SPINE:  1. Progressed cervical spine degeneration since 2011. Multilevel mild spondylolisthesis associated with increased moderate to severe facet hypertrophy at multiple levels. Associated degenerative appearing mild marrow edema in the right C5 lamina. Subsequent new or increased neural foraminal stenosis which is moderate or severe at the right C3, right C4, left C5, right C6 and bilateral C7 nerve levels. 2. Multifactorial mild cervical spinal stenosis at C6-C7 with up to mild spinal cord mass effect, and the bilateral C7 foraminal stenosis. No spinal cord signal abnormality.  LUMBAR SPINE:  1. Chronic facet degeneration at L4 and L5 has progressed since the 2017 MRI and is severe, with associated new degenerative marrow edema. Increased multifactorial mild spinal, right lateral recess, and bilateral neural foraminal stenosis at that level. 2. Other lumbar levels appear stable since 2017, including chronic disc and endplate degeneration eccentric to the left at L5-S1.   Electronically Signed   By: Genevie Ann M.D.   On: 05/17/2017 12:17    Hip Imaging: Hip-L DG 2-3 views:  Results for orders placed during the hospital encounter of 11/28/15  DG HIP UNILAT WITH PELVIS 2-3 VIEWS LEFT   Narrative CLINICAL DATA:  Fall last night.  Left hip pain.  EXAM: DG HIP (WITH OR WITHOUT PELVIS) 2-3V LEFT  COMPARISON:  None.  FINDINGS: No acute bony abnormality. Specifically, no fracture, subluxation, or dislocation. Soft tissues are intact. SI joints and hip joints are symmetric and unremarkable.  IMPRESSION: Negative.   Electronically Signed   By: Rolm Baptise M.D.   On: 11/28/2015 11:38    Knee Imaging: Knee-L DG 4 views:  Results for orders placed during the hospital encounter of 11/28/15   DG Knee Complete 4 Views Left   Narrative CLINICAL DATA:  Fall yesterday with persistent left knee pain, initial encounter  EXAM: LEFT KNEE - COMPLETE 4+ VIEW  COMPARISON:  None.  FINDINGS: No evidence of fracture, dislocation, or joint effusion. No evidence of arthropathy or other focal bone abnormality. Soft tissues are unremarkable.  IMPRESSION: No acute abnormality noted.   Electronically Signed   By: Inez Catalina M.D.   On: 11/28/2015 11:28    Foot Imaging: Foot-R DG Complete:  Results for orders placed during the hospital encounter of 12/10/16  DG Foot Complete Right   Narrative CLINICAL DATA:  Right foot pain, swelling status post fall  EXAM: RIGHT  FOOT COMPLETE - 3+ VIEW  COMPARISON:  11/28/2015  FINDINGS: No fracture or dislocation is seen.  Very mild degenerative changes of the 1st MTP joint.  Visualized soft tissues are within normal limits.  Tiny plantar and posterior calcaneal enthesophytes.  IMPRESSION: Negative.   Electronically Signed   By: Julian Hy M.D.   On: 12/10/2016 17:59    Complexity Note: Imaging results reviewed. Results shared with Samantha Macias, using Layman's terms.                         Meds   Current Outpatient Medications:  .  albuterol (PROVENTIL HFA;VENTOLIN HFA) 108 (90 Base) MCG/ACT inhaler, Inhale 2 puffs into the lungs every 6 (six) hours as needed for wheezing or shortness of breath., Disp: , Rfl:  .  ALPRAZolam (XANAX) 0.5 MG tablet, Take 0.5-0.75 mg by mouth at bedtime as needed for anxiety., Disp: , Rfl:  .  aspirin EC 81 MG tablet, Take 1 tablet (81 mg total) by mouth at bedtime., Disp: , Rfl:  .  buPROPion (WELLBUTRIN XL) 300 MG 24 hr tablet, Take 300 mg by mouth daily., Disp: , Rfl: 1 .  Cholecalciferol (VITAMIN D) 2000 units tablet, Take 2,000 Units by mouth daily., Disp: , Rfl:  .  diclofenac sodium (VOLTAREN) 1 % GEL, Apply 4 g topically 4 (four) times daily as needed. (Patient taking differently:  Apply 4 g topically 4 (four) times daily as needed. For pain), Disp: 100 g, Rfl: 5 .  donepezil (ARICEPT) 10 MG tablet, Take 10 mg by mouth at bedtime., Disp: , Rfl:  .  formoterol (PERFOROMIST) 20 MCG/2ML nebulizer solution, Take 20 mcg by nebulization 2 (two) times daily., Disp: , Rfl:  .  ibuprofen (ADVIL,MOTRIN) 600 MG tablet, Take 1 tablet (600 mg total) by mouth every 6 (six) hours as needed., Disp: 30 tablet, Rfl: 0 .  ipratropium (ATROVENT) 0.02 % nebulizer solution, Take 0.5 mg by nebulization 3 (three) times daily., Disp: , Rfl:  .  losartan-hydrochlorothiazide (HYZAAR) 100-12.5 MG tablet, Take 1 tablet by mouth daily., Disp: 90 tablet, Rfl: 1 .  mirtazapine (REMERON) 15 MG tablet, Take 15 mg by mouth at bedtime., Disp: , Rfl: 5 .  omeprazole (PRILOSEC) 20 MG capsule, Take 20 mg by mouth daily., Disp: , Rfl:  .  pantoprazole (PROTONIX) 40 MG tablet, Take 40 mg by mouth daily., Disp: , Rfl: 1 .  pregabalin (LYRICA) 200 MG capsule, Take 1 capsule (200 mg total) by mouth 3 (three) times daily., Disp: 30 capsule, Rfl: 0 .  QUEtiapine (SEROQUEL) 100 MG tablet, Take 150 mg by mouth at bedtime., Disp: , Rfl: 0 .  ramelteon (ROZEREM) 8 MG tablet, Take 1 tablet (8 mg total) by mouth at bedtime., Disp: 90 tablet, Rfl: 1 .  sertraline (ZOLOFT) 50 MG tablet, Take 50 mg by mouth daily., Disp: , Rfl: 5 .  SPIRIVA HANDIHALER 18 MCG inhalation capsule, Place 1 puff into inhaler and inhale daily., Disp: , Rfl: 0 .  tiZANidine (ZANAFLEX) 2 MG tablet, Take 1 tablet (2 mg total) by mouth every 6 (six) hours as needed for muscle spasms. (Patient taking differently: Take 4 mg by mouth every 6 (six) hours as needed for muscle spasms. ), Disp: 15 tablet, Rfl: 5 .  traMADol (ULTRAM) 50 MG tablet, Take 1 tablet (50 mg total) by mouth every 8 (eight) hours as needed. (Patient taking differently: Take 50 mg by mouth every 6 (six) hours as needed  for moderate pain. ), Disp: 90 tablet, Rfl: 0  ROS  Constitutional:  Denies any fever or chills Gastrointestinal: No reported hemesis, hematochezia, vomiting, or acute GI distress Musculoskeletal: Denies any acute onset joint swelling, redness, loss of ROM, or weakness Neurological: No reported episodes of acute onset apraxia, aphasia, dysarthria, agnosia, amnesia, paralysis, loss of coordination, or loss of consciousness  Allergies  Samantha Macias is allergic to shellfish allergy; tetanus toxoid adsorbed; ace inhibitors; codeine; nitrofurantoin monohyd macro; and red dye.  [place]  Drug: Samantha Macias  reports that she has current or past drug history. Drug: Cocaine. Alcohol:  reports that she does not drink alcohol. Tobacco:  reports that she has been smoking e-cigarettes. She has a 6.25 pack-year smoking history. She has never used smokeless tobacco. Medical:  has a past medical history of Abnormal gait, Allergy, Anxiety, Arthritis, Back pain, Bipolar disorder ([place]), CAD (coronary artery disease), Chronic kidney disease, Collagen vascular disease ([place]), Colon polyp, COPD (chronic obstructive pulmonary disease) ([place]), Falls, Fibromyalgia, GERD (gastroesophageal reflux disease), Hyperlipidemia, Hypertension, IBS (irritable bowel syndrome), Kidney disease, Migraine, Numbness and tingling, Renal insufficiency, Spinal stenosis, Static encephalopathy, Thyroid nodule (07/2017), and TIA (transient ischemic attack). Surgical: Samantha Macias  has a past surgical history that includes Tubal ligation; Nasal sinus surgery; Colonoscopy with propofol (N/A, 04/01/2017); Esophagogastroduodenoscopy (egd) with propofol (N/A, 04/01/2017); and Mouth surgery. Family: family history includes Alcohol abuse in her father; Anxiety disorder in her mother; Bipolar disorder in her mother; Congestive Heart Failure in her father; Depression in her mother; Heart attack in her father; Heart disease in her father and mother; Mental illness in her mother; Peripheral vascular disease in her mother; Thyroid disease  in her mother.  Constitutional Exam  General appearance: Well nourished, well developed, and well hydrated. In no apparent acute distress There were no vitals filed for this visit. BMI Assessment: Estimated body mass index is 30.71 kg/m as calculated from the following:   Height as of 03/17/18: 5' 8" (1.727 m).   Weight as of 03/17/18: 202 lb (91.6 kg).  BMI interpretation table: BMI level Category Range association with higher incidence of chronic pain  <18 kg/m2 Underweight   18.5-24.9 kg/m2 Ideal body weight   25-29.9 kg/m2 Overweight Increased incidence by 20%  30-34.9 kg/m2 Obese (Class I) Increased incidence by 68%  35-39.9 kg/m2 Severe obesity (Class II) Increased incidence by 136%  >40 kg/m2 Extreme obesity (Class III) Increased incidence by 254%   Patient's current BMI Ideal Body weight  There is no height or weight on file to calculate BMI. Patient weight not recorded   BMI Readings from Last 4 Encounters:  03/17/18 30.71 kg/m  12/24/17 30.41 kg/m  07/17/17 29.80 kg/m  07/10/17 29.80 kg/m   Wt Readings from Last 4 Encounters:  03/17/18 202 lb (91.6 kg)  12/24/17 200 lb (90.7 kg)  07/17/17 196 lb (88.9 kg)  07/10/17 196 lb (88.9 kg)  Psych/Mental status: Alert, oriented x 3 (person, place, & time)       Eyes: PERLA Respiratory: No evidence of acute respiratory distress  Cervical Spine Area Exam  Skin & Axial Inspection: No masses, redness, edema, swelling, or associated skin lesions Alignment: Symmetrical Functional ROM: Unrestricted ROM      Stability: No instability detected Muscle Tone/Strength: Functionally intact. No obvious neuro-muscular anomalies detected. Sensory (Neurological): Unimpaired Palpation: No palpable anomalies              Upper Extremity (UE) Exam    Side: Right upper extremity  Side: Left  upper extremity  Skin & Extremity Inspection: Skin color, temperature, and hair growth are WNL. No peripheral edema or cyanosis. No masses,  redness, swelling, asymmetry, or associated skin lesions. No contractures.  Skin & Extremity Inspection: Skin color, temperature, and hair growth are WNL. No peripheral edema or cyanosis. No masses, redness, swelling, asymmetry, or associated skin lesions. No contractures.  Functional ROM: Unrestricted ROM          Functional ROM: Unrestricted ROM          Muscle Tone/Strength: Functionally intact. No obvious neuro-muscular anomalies detected.  Muscle Tone/Strength: Functionally intact. No obvious neuro-muscular anomalies detected.  Sensory (Neurological): Unimpaired          Sensory (Neurological): Unimpaired          Palpation: No palpable anomalies              Palpation: No palpable anomalies              Provocative Test(s):  Phalen's test: deferred Tinel's test: deferred Apley's scratch test (touch opposite shoulder):  Action 1 (Across chest): deferred Action 2 (Overhead): deferred Action 3 (LB reach): deferred   Provocative Test(s):  Phalen's test: deferred Tinel's test: deferred Apley's scratch test (touch opposite shoulder):  Action 1 (Across chest): deferred Action 2 (Overhead): deferred Action 3 (LB reach): deferred    Thoracic Spine Area Exam  Skin & Axial Inspection: No masses, redness, or swelling Alignment: Symmetrical Functional ROM: Unrestricted ROM Stability: No instability detected Muscle Tone/Strength: Functionally intact. No obvious neuro-muscular anomalies detected. Sensory (Neurological): Unimpaired Muscle strength & Tone: No palpable anomalies  Lumbar Spine Area Exam  Skin & Axial Inspection: No masses, redness, or swelling Alignment: Symmetrical Functional ROM: Unrestricted ROM       Stability: No instability detected Muscle Tone/Strength: Functionally intact. No obvious neuro-muscular anomalies detected. Sensory (Neurological): Unimpaired Palpation: No palpable anomalies       Provocative Tests: Hyperextension/rotation test: deferred today        Lumbar quadrant test (Kemp's test): deferred today       Lateral bending test: deferred today       Patrick's Maneuver: deferred today                   FABER test: deferred today                   S-I anterior distraction/compression test: deferred today         S-I lateral compression test: deferred today         S-I Thigh-thrust test: deferred today         S-I Gaenslen's test: deferred today          Gait & Posture Assessment  Ambulation: Unassisted Gait: Relatively normal for age and body habitus Posture: WNL   Lower Extremity Exam    Side: Right lower extremity  Side: Left lower extremity  Stability: No instability observed          Stability: No instability observed          Skin & Extremity Inspection: Skin color, temperature, and hair growth are WNL. No peripheral edema or cyanosis. No masses, redness, swelling, asymmetry, or associated skin lesions. No contractures.  Skin & Extremity Inspection: Skin color, temperature, and hair growth are WNL. No peripheral edema or cyanosis. No masses, redness, swelling, asymmetry, or associated skin lesions. No contractures.  Functional ROM: Unrestricted ROM  Functional ROM: Unrestricted ROM                  Muscle Tone/Strength: Functionally intact. No obvious neuro-muscular anomalies detected.  Muscle Tone/Strength: Functionally intact. No obvious neuro-muscular anomalies detected.  Sensory (Neurological): Unimpaired  Sensory (Neurological): Unimpaired  Palpation: No palpable anomalies  Palpation: No palpable anomalies   Assessment & Plan  Primary Diagnosis & Pertinent Problem List: The primary encounter diagnosis was Chronic pain syndrome. Diagnoses of Chronic low back pain (Primary Area of Pain) (Bilateral) (L>R) w/ sciatica, Chronic lower extremity pain (Secondary Area of Pain) (Bilateral) (L>R), Chronic upper back pain (Tertiary Area of Pain) (Bilateral) (R>L), Chronic hand pain (Fourth Area of Pain) (Bilateral) (R>L),  Fibromyalgia syndrome, Abnormal MRI, cervical spine, and Abnormal MRI, lumbar spine were also pertinent to this visit.  Visit Diagnosis: 1. Chronic pain syndrome   2. Chronic low back pain (Primary Area of Pain) (Bilateral) (L>R) w/ sciatica   3. Chronic lower extremity pain (Secondary Area of Pain) (Bilateral) (L>R)   4. Chronic upper back pain [facility] Area of Pain) (Bilateral) (R>L)   5. Chronic hand pain (Fourth Area of Pain) (Bilateral) (R>L)   6. Fibromyalgia syndrome   7. Abnormal MRI, cervical spine   8. Abnormal MRI, lumbar spine    Problems updated and reviewed during this visit: Problem  Abnormal Mri, Cervical Spine   FINDINGS: MRI CERVICAL SPINE FINDINGS  Alignment: Chronic straightening of cervical lordosis. Mild anterolisthesis of C3 on C4 appears stable but similar mild degenerative appearing anterolisthesis of C4 on C5 has developed since 2011. Subtle anterolisthesis of C5 on C6.  Vertebrae: There is mild degenerative appearing marrow edema in the C5 lamina posteriorly in the midline (series 5, image 7). This is new compared to 2011. Background bone marrow signal is normal. Chronic but increased degenerative endplate irregularity inferiorly at C6. No other marrow edema or acute osseous abnormality.  Cord: Spinal cord signal is within normal limits at all visualized levels.  Posterior Fossa, vertebral arteries, paraspinal tissues: Negative visualized posterior fossa. Cervicomedullary junction is within normal limits. Preserved major vascular flow voids in the neck. Negative neck soft tissues.  Disc levels:  C2-C3: Moderate to severe facet hypertrophy greater on the right has developed since 2011. Right foraminal disc bulge and endplate spurring. No spinal stenosis, but moderate right C3 neural foraminal stenosis is new from the prior MRI.  C3-C4: Moderate to severe facet hypertrophy greater on the right has progressed since 2011. Mild foraminal disc  bulging and endplate spurring greater on the right. Mild left and moderate to severe right C4 foraminal stenosis have increased since 2011.  C4-C5: Chronic left side facet hypertrophy, but moderate to severe facet degeneration has progressed since 2011. Left greater than right foraminal disc bulging and endplate spurring. Mild left ligament flavum hypertrophy. No spinal stenosis. Severe left and mild to moderate right C5 foraminal stenosis have progressed.  C5-C6: Mild facet hypertrophy has increased on the right. Mild circumferential disc bulge with foraminal disc and endplate spurring. No spinal stenosis. Increased borderline to mild left and moderate right C6 foraminal stenosis.  C6-C7: Disc space loss since 2011. Circumferential disc bulge and endplate spurring with broad-based posterior and biforaminal component. Mild ligament flavum hypertrophy. Mild spinal stenosis. Borderline to mild spinal cord mass effect. Moderate to severe bilateral C7 neural foraminal stenosis. This level has progressed since 2011.  C7-T1: Progressed moderate facet hypertrophy greater on the left. Mild foraminal endplate spurring. Increased mild left C8 foraminal stenosis.  No  upper thoracic spinal or foraminal stenosis.   Abnormal Mri, Lumbar Spine   MRI LUMBAR SPINE FINDINGS  Segmentation: Normal as demonstrated on the 2015 CT, which is the same numbering system used on the 2017 MRI.  Alignment: Stable vertebral height and alignment since 2017. Mild retrolisthesis at L5-S1. Relatively preserved lumbar lordosis; mild reversal at L1-L2.  Vertebrae: Chronic but perhaps mildly increased degenerative appearing endplate marrow edema posteriorly and to the left at L5-S1.  New degenerative appearing marrow edema throughout the chronically degenerated right L4 and L5 facets (with some pedicle involvement). See series 12, images 3 and 4).  Background bone marrow signal remains normal. There is  trace degenerative endplate edema anteriorly superiorly at L3. No other acute osseous abnormality. Intact visible sacrum and SI joints.  Conus medullaris and cauda equina: Conus extends to the T12-L1 level. Conus and cauda equina appear normal. Fairly capacious spinal canal.  Paraspinal and other soft tissues: Negative.  Disc levels:  T12-L1:  Negative.  L1-L2: Chronic disc desiccation and disc space loss. Stable mild right paracentral disc protrusion. No stenosis.  L2-L3: Mild chronic disc bulge, most affecting the left neural foramen. Small left foraminal annular fissure of the disc is increased in conspicuity on series 10, image 12. Stable mild facet hypertrophy. Mild left L2 neural foraminal stenosis is stable.  L3-L4: Chronic disc desiccation and mostly far lateral disc bulging. Mild facet hypertrophy. No stenosis.  L4-L5: Chronic circumferential disc bulge. Broad-based bilateral foraminal involvement with endplate spurring far laterally on the right. Possible chronic left foraminal annular fissure. Moderate to severe facet hypertrophy. Increased bilateral facet joint fluid, greater on the right. Borderline to mild spinal stenosis and right lateral recess stenosis (descending right L5 nerve level). Mild bilateral L4 foraminal stenosis. This level has progressed since 2017.  L5-S1: Chronic circumferential disc bulge and endplate spurring eccentric to the left and most affecting the left neural foramen. Mild to moderate facet hypertrophy is stable. Increased mild epidural lipomatosis. No spinal or lateral recess stenosis. Moderate left L5 foraminal stenosis is stable.   Ddd (Degenerative Disc Disease), Lumbar  Lumbar facet hypertrophy (Bilateral)  Lumbar facet syndrome (Bilateral)  Cervical facet hypertrophy (Bilateral)  Cervical facet syndrome (Bilateral)  Cervical foraminal stenosis  Lumbar Foraminal Stenosis  Cervical central spinal stenosis  Lumbar central  spinal stenosis  Anterolisthesis  Retrolisthesis  Chronic low back pain (Primary Area of Pain) (Bilateral) (L>R) w/ sciatica  Chronic lower extremity pain (Secondary Area of Pain) (Bilateral) (L>R)  Chronic upper back pain (Tertiary Area of Pain) (Bilateral) (R>L)  Chronic hand pain (Fourth Area of Pain) (Bilateral) (R>L)  Chronic knee pain (Left)  Acute Pain of Left Knee  Left Hip Pain  Cervical Spondylosis With Myelopathy  Cervical Myelopathy (Hcc)  Ddd (Degenerative Disc Disease), Cervical  Arthralgia of Multiple Joints  Osteoarthritis of Both Hands  Bilateral Carpal Tunnel Syndrome  Chronic Pain Syndrome  Cervical Osteoarthritis   8/10 back pain -   Fibromyalgia Syndrome  Abnormal Gait  Recurrent Falls  Lumbar Radiculopathy  Trochanteric bursitis of hip (Bilateral)  Pharmacologic Therapy  Disorder of Skeletal System  Problems Influencing Health Status  Long Term Current Use of Opiate Analgesic  Cigarette Nicotine Dependence Without Complication  Mild Cognitive Disorder  Tobacco User  Loss of Memory  Vitamin D Deficiency  Chronic kidney disease (CKD), stage III (moderate) (HCC)   Dr. Abigail Butts   Insomnia, Persistent  History of Syncope   cardiologist - Dr. Nehemiah Massed no futher work up recommended   H/O  Transient Cerebral Ischemia  Chest Pain  Hypertension  Lightheadedness  Sleeping Difficulties  Mild Cognitive Impairment, So Stated  Cardiac Syncope  Precordial Pain  Copd, Moderate (Hcc)  Anterior Chest Wall Pain  Insomnia Secondary to Depression With Anxiety  Hld (Hyperlipidemia)  Anxiety   Last Assessment & Plan:  Chronic problem, does not seem well controlled currently, worse recently with variable medical conditions with chronic pain, fibro, recurrent falls now. - improved with PRN xanax, but not ideal chronic control, has been on benzo for years, also prior on clonazepam since discontinued - Concern with prior history of Bipolar and OCD per chart review,  fam history of bipolar  Plan: 1. Advised patient that I would not recommend continuing Xanax for anxiety, ideally she would need different long term anti-anxiety or mood stabilizing medication. Given her complex history, advised her that she needs to establish with Psychiatry, currently receives therapy from from [facility], and given resources today of various walk in clinics to establish - Counseled on tapering her remaining xanax, red flags if withdrawal may seek treatment in emergency department, no prior acute BDZ withdrawal in past, reportedly can skip several days without symptoms Last Assessment & Plan:  Chronic problem, does not seem well controlled currently, worse recently with variable medical conditions with chronic pain, fibro, recurrent falls now. - improved with PRN xanax, but not ideal chronic control, has been on benzo for years, also prior on clonazepam since discontinued - Concern with prior history of Bipolar and OCD per chart review, fam history of bipolar  Plan: 1. Advised patient that I would not recommend continuing Xanax for anxiety, ideally she would need different long term anti-anxiety or mood stabilizing medication. Given her complex history, advised her that she needs to establish with Psychiatry, currently receives therapy from from [facility], and given resources today of various walk in clinics to establish - Counseled on tapering her remaining xanax, red flags if withdrawal may seek treatment in emergency department, no prior acute BDZ withdrawal in past, reportedly can skip several days without symptoms   Connective Tissue Disease (Hcc)   Followed by [facility] : [facility] (02/2016) Extensive labs unremarkable for clear etiology, unlikely Lupus at this time  Overview:  Diagnosed with lupus /per patient  Diagnosed with lupus /per patient  Overview:  Followed by [facility] (02/2016) Extensive labs unremarkable for  clear etiology, unlikely Lupus at this time  Last Assessment & Plan:  Unclear exact diagnosis, previous diagnosis lupus >8 years ago from prior rheum variety of symptoms with malar rash, arthralgia, positive ANA. Now re-established with [facility] 02/2016, had extensive labs done, unremarkable for auto immune or lupus etiology. She has been off prednisone and plaquenil for a while, previously most improved on bursts of prednisone. Per Rheum recently unlikely dermatomyositis given no evidence. - Concern remains with prox lower extremity weakness, recurrent falls, chronic pain / fibro - Additionally, I don't see any charted history of lupus affecting her cardiac or renal systems, despite patient reported today - Pending evaluation by Neurology 10/30 - Continue current pain management Tizanidine, Tramadol, Lyrica - Follow-up in future, may need future trial on prednisone if flare   Thyromegaly   8/10 back pain -   Thyroid Cyst  Hypertension, Benign  Hyperparathyroidism, secondary renal (HCC)  Allergic Rhinitis  Anemia in Chronic Illness   kidney disease   Gad (Generalized Anxiety Disorder)  Bipolar affective disorder, currently depressed, mild (HCC)  Atherosclerosis of Left Carotid Artery  Cn (Constipation)  Kidney Cysts  Dyslipidemia  Gastro-Esophageal Reflux Disease Without Esophagitis  History of Colon Polyps  Hpv Test Positive  Calculus of Kidney  Ocd (Obsessive Compulsive Disorder)  Overweight  Ptsd (Post-Traumatic Stress Disorder)  Type III Hyperlipoproteinemia    Plan of Care  Pharmacotherapy (Medications Ordered): No orders of the defined types were placed in this encounter.  Procedure Orders    No procedure(s) ordered today   Lab Orders  No laboratory test(s) ordered today   Imaging Orders  No imaging studies ordered today   Referral Orders  No referral(s) requested today    Pharmacological management options:  Opioid Analgesics: We'll take over  management today. See above orders Membrane stabilizer: We have discussed the possibility of optimizing this mode of therapy, if tolerated Muscle relaxant: We have discussed the possibility of a trial NSAID: We have discussed the possibility of a trial Other analgesic(s): To be determined at a later time   Interventional management options: Planned, scheduled, and/or pending:    ***   Considering:   ***   PRN Procedures:   None at this time   Provider-requested follow-up: No follow-ups on file.  Future Appointments  Date Time Provider [place]  04/07/2018  9:30 AM Milinda Pointer, MD [facility] None    Primary Care Physician: Tracie Harrier, MD Location: [facility] Note by: Gaspar Cola, MD Date: 04/07/2018; Time: 12:54 PM

## 2018-04-07 ENCOUNTER — Ambulatory Visit: Payer: Medicare Other | Admitting: Pain Medicine

## 2018-05-14 ENCOUNTER — Other Ambulatory Visit: Payer: Self-pay | Admitting: Internal Medicine

## 2018-05-14 DIAGNOSIS — Z1231 Encounter for screening mammogram for malignant neoplasm of breast: Secondary | ICD-10-CM

## 2018-05-14 DIAGNOSIS — M25511 Pain in right shoulder: Secondary | ICD-10-CM

## 2018-06-20 DIAGNOSIS — J449 Chronic obstructive pulmonary disease, unspecified: Secondary | ICD-10-CM | POA: Diagnosis not present

## 2018-06-30 DIAGNOSIS — Z0283 Encounter for blood-alcohol and blood-drug test: Secondary | ICD-10-CM | POA: Diagnosis not present

## 2018-06-30 DIAGNOSIS — M2550 Pain in unspecified joint: Secondary | ICD-10-CM | POA: Diagnosis not present

## 2018-06-30 DIAGNOSIS — E785 Hyperlipidemia, unspecified: Secondary | ICD-10-CM | POA: Diagnosis not present

## 2018-06-30 DIAGNOSIS — R413 Other amnesia: Secondary | ICD-10-CM | POA: Diagnosis not present

## 2018-06-30 DIAGNOSIS — G894 Chronic pain syndrome: Secondary | ICD-10-CM | POA: Diagnosis not present

## 2018-06-30 DIAGNOSIS — F331 Major depressive disorder, recurrent, moderate: Secondary | ICD-10-CM | POA: Diagnosis not present

## 2018-06-30 DIAGNOSIS — R2689 Other abnormalities of gait and mobility: Secondary | ICD-10-CM | POA: Diagnosis not present

## 2018-06-30 DIAGNOSIS — F5104 Psychophysiologic insomnia: Secondary | ICD-10-CM | POA: Diagnosis not present

## 2018-06-30 DIAGNOSIS — Z79891 Long term (current) use of opiate analgesic: Secondary | ICD-10-CM | POA: Diagnosis not present

## 2018-06-30 DIAGNOSIS — F17219 Nicotine dependence, cigarettes, with unspecified nicotine-induced disorders: Secondary | ICD-10-CM | POA: Diagnosis not present

## 2018-06-30 DIAGNOSIS — G479 Sleep disorder, unspecified: Secondary | ICD-10-CM | POA: Diagnosis not present

## 2018-06-30 DIAGNOSIS — R441 Visual hallucinations: Secondary | ICD-10-CM | POA: Diagnosis not present

## 2018-06-30 DIAGNOSIS — Z720 Tobacco use: Secondary | ICD-10-CM | POA: Diagnosis not present

## 2018-06-30 DIAGNOSIS — M797 Fibromyalgia: Secondary | ICD-10-CM | POA: Diagnosis not present

## 2018-07-03 DIAGNOSIS — F17219 Nicotine dependence, cigarettes, with unspecified nicotine-induced disorders: Secondary | ICD-10-CM | POA: Insufficient documentation

## 2018-07-03 DIAGNOSIS — R413 Other amnesia: Secondary | ICD-10-CM | POA: Insufficient documentation

## 2018-07-03 DIAGNOSIS — R2689 Other abnormalities of gait and mobility: Secondary | ICD-10-CM | POA: Insufficient documentation

## 2018-07-03 DIAGNOSIS — R441 Visual hallucinations: Secondary | ICD-10-CM | POA: Insufficient documentation

## 2018-07-07 ENCOUNTER — Other Ambulatory Visit: Payer: Self-pay | Admitting: Neurology

## 2018-07-07 DIAGNOSIS — R413 Other amnesia: Secondary | ICD-10-CM

## 2018-07-15 ENCOUNTER — Ambulatory Visit: Admission: RE | Admit: 2018-07-15 | Payer: Medicare HMO | Source: Ambulatory Visit

## 2018-07-15 DIAGNOSIS — M797 Fibromyalgia: Secondary | ICD-10-CM | POA: Diagnosis not present

## 2018-07-15 DIAGNOSIS — G8929 Other chronic pain: Secondary | ICD-10-CM | POA: Diagnosis not present

## 2018-07-15 DIAGNOSIS — M25562 Pain in left knee: Secondary | ICD-10-CM | POA: Diagnosis not present

## 2018-07-15 DIAGNOSIS — M5442 Lumbago with sciatica, left side: Secondary | ICD-10-CM | POA: Diagnosis not present

## 2018-07-15 DIAGNOSIS — G479 Sleep disorder, unspecified: Secondary | ICD-10-CM | POA: Diagnosis not present

## 2018-07-15 DIAGNOSIS — F3131 Bipolar disorder, current episode depressed, mild: Secondary | ICD-10-CM | POA: Diagnosis not present

## 2018-07-15 DIAGNOSIS — Z720 Tobacco use: Secondary | ICD-10-CM | POA: Diagnosis not present

## 2018-07-30 ENCOUNTER — Emergency Department
Admission: EM | Admit: 2018-07-30 | Discharge: 2018-07-30 | Disposition: A | Discharge status: Home / Self Care | Payer: Medicare HMO | Attending: Student in an Organized Health Care Education/Training Program | Admitting: Student in an Organized Health Care Education/Training Program

## 2018-07-30 ENCOUNTER — Emergency Department: Payer: Medicare HMO

## 2018-07-30 ENCOUNTER — Other Ambulatory Visit: Payer: Self-pay

## 2018-07-30 ENCOUNTER — Encounter: Payer: Self-pay | Admitting: Emergency Medicine

## 2018-07-30 DIAGNOSIS — Y92019 Unspecified place in single-family (private) house as the place of occurrence of the external cause: Secondary | ICD-10-CM | POA: Insufficient documentation

## 2018-07-30 DIAGNOSIS — Y9301 Activity, walking, marching and hiking: Secondary | ICD-10-CM | POA: Diagnosis not present

## 2018-07-30 DIAGNOSIS — W010XXA Fall on same level from slipping, tripping and stumbling without subsequent striking against object, initial encounter: Secondary | ICD-10-CM | POA: Insufficient documentation

## 2018-07-30 DIAGNOSIS — F1410 Cocaine abuse, uncomplicated: Secondary | ICD-10-CM | POA: Insufficient documentation

## 2018-07-30 DIAGNOSIS — S93601A Unspecified sprain of right foot, initial encounter: Secondary | ICD-10-CM | POA: Diagnosis not present

## 2018-07-30 DIAGNOSIS — Y998 Other external cause status: Secondary | ICD-10-CM | POA: Insufficient documentation

## 2018-07-30 DIAGNOSIS — F17290 Nicotine dependence, other tobacco product, uncomplicated: Secondary | ICD-10-CM | POA: Diagnosis not present

## 2018-07-30 DIAGNOSIS — S99921A Unspecified injury of right foot, initial encounter: Secondary | ICD-10-CM | POA: Diagnosis not present

## 2018-07-30 DIAGNOSIS — Z79899 Other long term (current) drug therapy: Secondary | ICD-10-CM | POA: Insufficient documentation

## 2018-07-30 DIAGNOSIS — J449 Chronic obstructive pulmonary disease, unspecified: Secondary | ICD-10-CM | POA: Insufficient documentation

## 2018-07-30 DIAGNOSIS — N183 Chronic kidney disease, stage 3 (moderate): Secondary | ICD-10-CM | POA: Insufficient documentation

## 2018-07-30 DIAGNOSIS — I129 Hypertensive chronic kidney disease with stage 1 through stage 4 chronic kidney disease, or unspecified chronic kidney disease: Secondary | ICD-10-CM | POA: Insufficient documentation

## 2018-07-30 DIAGNOSIS — M79671 Pain in right foot: Secondary | ICD-10-CM | POA: Diagnosis not present

## 2018-07-30 MED ORDER — TRAMADOL HCL 50 MG PO TABS: 50.0000 mg | ORAL_TABLET | Freq: Four times a day (QID) | ORAL | 0 refills | Status: DC | PRN

## 2018-07-30 MED ORDER — TRAMADOL HCL 50 MG PO TABS
50.0000 mg | ORAL_TABLET | Freq: Once | ORAL | Status: AC
  Administered 2018-07-30: 50 mg via ORAL
  Filled 2018-07-30: qty 1

## 2018-07-30 NOTE — ED Triage Notes (Signed)
States she tripped over her dog last pm  Having pain to right foot  States pain is mainly to great toe and lateral foot  Unable to bear full wt

## 2018-07-30 NOTE — ED Provider Notes (Signed)
[facility] Emergency Department Provider Note   ____________________________________________   First MD Initiated Contact with Patient 07/30/18 1000     (approximate)  I have reviewed the triage vital signs and the nursing notes.   HISTORY  Chief Complaint Fall and Foot Pain    HPI Samantha Macias is a 58 y.o. female patient presents with right foot pain secondary to a trip and fall.  Patient states she is tripped over her dog last night.  Patient pain is constant rates mostly in the medial aspect of the foot and the great right toe.  Patient state unable to bear weight without discomfort.  Patient rates pain as a 9/10.  Patient described the pain is "achy".  No palliative measures except for aspirin for the complaint.    Past Medical History:  Diagnosis Date  . Abnormal gait   . Allergy   . Anxiety   . Arthritis   . Back pain   . Bipolar disorder ([place])   . CAD (coronary artery disease)   . Chronic kidney disease   . Collagen vascular disease ([place])   . Colon polyp   . COPD (chronic obstructive pulmonary disease) ([place])   . Falls   . Fibromyalgia   . GERD (gastroesophageal reflux disease)    erosive gastritis per EGD 07/2017  . Hyperlipidemia   . Hypertension   . IBS (irritable bowel syndrome)   . Kidney disease   . Migraine   . Numbness and tingling    hand and legs  . Renal insufficiency   . Spinal stenosis   . Static encephalopathy   . Thyroid nodule 07/2017   several nodules being followed by Dr. Gabriel Carina  . TIA (transient ischemic attack)     Patient Active Problem List   Diagnosis Date Noted  . Abnormal MRI, cervical spine 04/03/2018  . Abnormal MRI, lumbar spine 04/03/2018  . DDD (degenerative disc disease), lumbar 04/03/2018  . Lumbar facet hypertrophy (Bilateral) 04/03/2018  . Lumbar facet syndrome (Bilateral) 04/03/2018  . Cervical facet hypertrophy (Bilateral) 04/03/2018  . Cervical facet syndrome (Bilateral) 04/03/2018   . Cervical foraminal stenosis 04/03/2018  . Lumbar foraminal stenosis 04/03/2018  . Cervical central spinal stenosis 04/03/2018  . Lumbar central spinal stenosis 04/03/2018  . Anterolisthesis 04/03/2018  . Retrolisthesis 04/03/2018  . Chest pain 03/17/2018  . Hypertension 03/17/2018  . Chronic low back pain (Primary Area of Pain) (Bilateral) (L>R) w/ sciatica 03/17/2018  . Chronic lower extremity pain (Secondary Area of Pain) (Bilateral) (L>R) 03/17/2018  . Chronic upper back pain [facility] Area of Pain) (Bilateral) (R>L) 03/17/2018  . Chronic hand pain (Fourth Area of Pain) (Bilateral) (R>L) 03/17/2018  . Chronic knee pain (Left) 03/17/2018  . Pharmacologic therapy 03/17/2018  . Disorder of skeletal system 03/17/2018  . Problems influencing health status 03/17/2018  . Long term current use of opiate analgesic 03/17/2018  . Acute pain of left knee 02/19/2018  . Left hip pain 02/19/2018  . Cervical spondylosis with myelopathy 07/02/2017  . Cervical myelopathy ([place]) 06/27/2017  . Cigarette nicotine dependence without complication 96/75/9163  . Lightheadedness 03/04/2017  . Sleeping difficulties 03/04/2017  . Mild cognitive disorder 11/21/2016  . Mild cognitive impairment, so stated 11/21/2016  . Cardiac syncope 11/16/2016  . Precordial pain 11/16/2016  . COPD, moderate ([place]) 10/09/2016  . Anterior chest wall pain 09/19/2016  . Tobacco user 05/09/2016  . Insomnia secondary to depression with anxiety 04/17/2016  . Loss of memory 04/11/2016  . DDD (degenerative  disc disease), cervical 03/29/2016  . Vitamin D deficiency 03/28/2016  . HLD (hyperlipidemia) 03/28/2016  . Anxiety 03/28/2016  . Connective tissue disease ([place]) 02/17/2016  . Arthralgia of multiple joints 02/17/2016  . Thyromegaly 08/18/2015  . Thyroid cyst 08/18/2015  . Osteoarthritis of both hands 08/01/2015  . Hypertension, benign 07/01/2015  . Hyperparathyroidism, secondary renal ([place]) 12/03/2014  . Allergic  rhinitis 12/01/2014  . Anemia in chronic illness 12/01/2014  . GAD (generalized anxiety disorder) 12/01/2014  . Bipolar affective disorder, currently depressed, mild ([place]) 12/01/2014  . Atherosclerosis of left carotid artery 12/01/2014  . Bilateral carpal tunnel syndrome 12/01/2014  . Chronic kidney disease (CKD), stage III (moderate) ([place]) 12/01/2014  . Insomnia, persistent 12/01/2014  . Chronic pain syndrome 12/01/2014  . CN (constipation) 12/01/2014  . Kidney cysts 12/01/2014  . Cervical osteoarthritis 12/01/2014  . Dyslipidemia 12/01/2014  . Fibromyalgia syndrome 12/01/2014  . Abnormal gait 12/01/2014  . Gastro-esophageal reflux disease without esophagitis 12/01/2014  . History of colon polyps 12/01/2014  . History of syncope 12/01/2014  . H/O transient cerebral ischemia 12/01/2014  . HPV test positive 12/01/2014  . Recurrent falls 12/01/2014  . Calculus of kidney 12/01/2014  . Lumbar radiculopathy 12/01/2014  . OCD (obsessive compulsive disorder) 12/01/2014  . Overweight 12/01/2014  . PTSD (post-traumatic stress disorder) 12/01/2014  . Trochanteric bursitis of hip (Bilateral) 12/01/2014  . Type III hyperlipoproteinemia 02/19/2014    Past Surgical History:  Procedure Laterality Date  . COLONOSCOPY WITH PROPOFOL N/A 04/01/2017   Procedure: COLONOSCOPY WITH PROPOFOL;  Surgeon: Lollie Sails, MD;  Location: [facility] ENDOSCOPY;  Service: Endoscopy;  Laterality: N/A;  . ESOPHAGOGASTRODUODENOSCOPY (EGD) WITH PROPOFOL N/A 04/01/2017   Procedure: ESOPHAGOGASTRODUODENOSCOPY (EGD) WITH PROPOFOL;  Surgeon: Lollie Sails, MD;  Location: [facility] ENDOSCOPY;  Service: Endoscopy;  Laterality: N/A;  . MOUTH SURGERY    . NASAL SINUS SURGERY    . TUBAL LIGATION      Prior to Admission medications   Medication Sig Start Date End Date Taking? Authorizing Provider  albuterol (PROVENTIL HFA;VENTOLIN HFA) 108 (90 Base) MCG/ACT inhaler Inhale 2 puffs into the lungs every 6 (six) hours as  needed for wheezing or shortness of breath.    [provider]  ALPRAZolam Duanne Moron) 0.5 MG tablet Take 0.5-0.75 mg by mouth at bedtime as needed for anxiety.    [provider]  aspirin EC 81 MG tablet Take 1 tablet (81 mg total) by mouth at bedtime. 12/01/15   [place]le Sizer, MD  buPROPion (WELLBUTRIN XL) 300 MG 24 hr tablet Take 300 mg by mouth daily. 05/15/17   [provider]  Cholecalciferol (VITAMIN D) 2000 units tablet Take 2,000 Units by mouth daily.    [provider]  diclofenac sodium (VOLTAREN) 1 % GEL Apply 4 g topically 4 (four) times daily as needed. Patient taking differently: Apply 4 g topically 4 (four) times daily as needed. For pain 03/27/16   Olin Hauser, DO  donepezil (ARICEPT) 10 MG tablet Take 10 mg by mouth at bedtime.    [provider]  formoterol (PERFOROMIST) 20 MCG/2ML nebulizer solution Take 20 mcg by nebulization 2 (two) times daily.    [provider]  ibuprofen (ADVIL,MOTRIN) 600 MG tablet Take 1 tablet (600 mg total) by mouth every 6 (six) hours as needed. 11/28/15   Sable Feil, PA-C  ipratropium (ATROVENT) 0.02 % nebulizer solution Take 0.5 mg by nebulization 3 (three) times daily.    [provider]  losartan-hydrochlorothiazide (HYZAAR) 100-12.5 MG  tablet Take 1 tablet by mouth daily. 12/01/15   [place] Sizer, MD  mirtazapine (REMERON) 15 MG tablet Take 15 mg by mouth at bedtime. 07/26/17   [provider]  omeprazole (PRILOSEC) 20 MG capsule Take 20 mg by mouth daily.    [provider]  pantoprazole (PROTONIX) 40 MG tablet Take 40 mg by mouth daily. 07/15/17   [provider]  pregabalin (LYRICA) 200 MG capsule Take 1 capsule (200 mg total) by mouth 3 (three) times daily. 06/13/16   Drenda Freeze, MD  QUEtiapine (SEROQUEL) 100 MG tablet Take 150 mg by mouth at bedtime. 07/15/17   [provider]  ramelteon (ROZEREM) 8 MG tablet Take 1 tablet (8  mg total) by mouth at bedtime. 03/27/16   Karamalegos, Devonne Doughty, DO  sertraline (ZOLOFT) 50 MG tablet Take 50 mg by mouth daily. 06/17/17   [provider]  SPIRIVA HANDIHALER 18 MCG inhalation capsule Place 1 puff into inhaler and inhale daily. 05/23/17   [provider]  tiZANidine (ZANAFLEX) 2 MG tablet Take 1 tablet (2 mg total) by mouth every 6 (six) hours as needed for muscle spasms. Patient taking differently: Take 4 mg by mouth every 6 (six) hours as needed for muscle spasms.  06/13/16   Drenda Freeze, MD  traMADol (ULTRAM) 50 MG tablet Take 1 tablet (50 mg total) by mouth every 8 (eight) hours as needed. Patient taking differently: Take 50 mg by mouth every 6 (six) hours as needed for moderate pain.  12/01/15   [place] Sizer, MD  traMADol (ULTRAM) 50 MG tablet Take 1 tablet (50 mg total) by mouth every 6 (six) hours as needed. 07/30/18 07/30/19  Sable Feil, PA-C    Allergies Shellfish allergy; Tetanus toxoid adsorbed; Ace inhibitors; Codeine; Nitrofurantoin monohyd macro; and Red dye  Family History  Problem Relation Age of Onset  . Congestive Heart Failure Father   . Alcohol abuse Father   . Heart disease Father   . Heart attack Father   . Peripheral vascular disease Mother   . Anxiety disorder Mother   . Depression Mother   . Thyroid disease Mother   . Heart disease Mother   . Mental illness Mother   . Bipolar disorder Mother     Social History Social History   Tobacco Use  . Smoking status: Current Every Day Smoker    Packs/day: 0.25    Years: 25.00    Pack years: 6.25    Types: E-cigarettes  . Smokeless tobacco: Never Used  Substance Use Topics  . Alcohol use: No    Alcohol/week: 0.0 standard drinks  . Drug use: Yes    Types: Cocaine    Comment: negative UDS on 07/31/2017    Review of Systems  Constitutional: No fever/chills Eyes: No visual changes. ENT: No sore throat. Cardiovascular: Denies chest pain. Respiratory: Denies  shortness of breath. Gastrointestinal: No abdominal pain.  No nausea, no vomiting.  No diarrhea.  No constipation. Genitourinary: Negative for dysuria. Musculoskeletal: Right foot pain. Skin: Negative for rash. Neurological: Negative for headaches, focal weakness or numbness. Psychiatric:  Anxiety and bipolar. Endocrine:  Hyperlipidemia and hypertension. Allergic/Immunilogical: See allergy list. ____________________________________________   PHYSICAL EXAM:  VITAL SIGNS: ED Triage Vitals  Enc Vitals Group     BP 07/30/18 0953 132/88     Pulse Rate 07/30/18 0953 89     Resp 07/30/18 0953 17     Temp 07/30/18 0953 (!) 97.5 F (36.4 C)  Temp Source 07/30/18 0953 Oral     SpO2 07/30/18 0953 97 %     Weight 07/30/18 0952 203 lb (92.1 kg)     Height 07/30/18 0952 5\' 8"  (1.727 m)     Head Circumference --      Peak Flow --      Pain Score 07/30/18 0954 9     Pain Loc --      Pain Edu? --      Excl. in [place]? --    Constitutional: Alert and oriented. Well appearing and in no acute distress. Cardiovascular: Normal rate, regular rhythm. Grossly normal heart sounds.  Good peripheral circulation. Respiratory: Normal respiratory effort.  No retractions. Lungs CTAB. Musculoskeletal: No obvious deformity to the right foot.  Moderate guarding palpation of the first and fifth metatarsal.  Ambulates for support.   Neurologic:  Normal speech and language. No gross focal neurologic deficits are appreciated. No gait instability. Skin:  Skin is warm, dry and intact. No rash noted. Psychiatric: Mood and affect are normal. Speech and behavior are normal.  ____________________________________________   LABS (all labs ordered are listed, but only abnormal results are displayed)  Labs Reviewed - No data to display ____________________________________________  EKG   ____________________________________________  RADIOLOGY  ED MD interpretation:    Official radiology report(s): Dg Foot  Complete Right  Result Date: 07/30/2018 CLINICAL DATA:  Right foot pain after injury last night. EXAM: RIGHT FOOT COMPLETE - 3+ VIEW COMPARISON:  Radiographs of December 10, 2016. FINDINGS: There is no evidence of fracture or dislocation. Stable degenerative changes seen involving first metatarsophalangeal joint. Mild posterior calcaneal spurring is noted. Soft tissues are unremarkable. IMPRESSION: No acute abnormality seen in the right foot. Electronically Signed   By: Marijo Conception, M.D.   On: 07/30/2018 11:01    ____________________________________________   PROCEDURES  Procedure(s) performed (including Critical Care):  Procedures   ____________________________________________   INITIAL IMPRESSION / ASSESSMENT AND PLAN / ED COURSE  As part of my medical decision making, I reviewed the following data within the [place]     Patient presents with right foot pain secondary to a sprain.  Discussed x-ray findings with patient.  Patient was Ace wrapped and she given discharge care instructions.  Patient advised follow-up PCP.      ____________________________________________   FINAL CLINICAL IMPRESSION(S) / ED DIAGNOSES  Final diagnoses:  Sprain of right foot, initial encounter     ED Discharge Orders         Ordered    traMADol (ULTRAM) 50 MG tablet  Every 6 hours PRN     07/30/18 1112           Note:  This document was prepared using Dragon voice recognition software and may include unintentional dictation errors.    Sable Feil, PA-C 07/30/18 1114    Merlyn Lot, MD 07/30/18 1131

## 2018-08-10 NOTE — Progress Notes (Deleted)
Patient's Name: Samantha Macias  MRN: [id number]  Referring Provider: Tracie Harrier, MD  DOB: May 31, 1961  PCP: Tracie Harrier, MD  DOS: 08/13/2018  Note by: Gaspar Cola, MD  Service setting: Ambulatory outpatient  Specialty: Interventional Pain Management  Location: [facility] (AMB) Pain Management Facility    Patient type: Established   Primary Reason(s) for Visit: Encounter for evaluation before starting new chronic pain management plan of care (Level of risk: moderate) CC: No chief complaint on file.  HPI  Samantha Macias is a 58 y.o. year old, female patient, who comes today for a follow-up evaluation to review the test results and decide on a treatment plan. She has Allergic rhinitis; Anemia in chronic illness; GAD (generalized anxiety disorder); Bipolar affective disorder, currently depressed, mild ([place]); Atherosclerosis of left carotid artery; Bilateral carpal tunnel syndrome; Chronic kidney disease (CKD), stage III (moderate) ([place]); Insomnia, persistent; Chronic pain syndrome; CN (constipation); Kidney cysts; Cervical osteoarthritis; Dyslipidemia; Fibromyalgia syndrome; Abnormal gait; Gastro-esophageal reflux disease without esophagitis; History of colon polyps; History of syncope; H/O transient cerebral ischemia; HPV test positive; Recurrent falls; Calculus of kidney; Lumbar radiculopathy; OCD (obsessive compulsive disorder); Overweight; PTSD (post-traumatic stress disorder); Trochanteric bursitis of hip (Bilateral); Hyperparathyroidism, secondary renal ([place]); Type III hyperlipoproteinemia; Hypertension, benign; Osteoarthritis of both hands; Thyromegaly; Thyroid cyst; Connective tissue disease ([place]); Arthralgia of multiple joints; Vitamin D deficiency; HLD (hyperlipidemia); Anxiety; Acute pain of left knee; Anterior chest wall pain; Cardiac syncope; Chest pain; Precordial pain; Cigarette nicotine dependence without complication; COPD, moderate ([place]); Left hip pain; Lightheadedness; Loss of  memory; Mild cognitive disorder; Mild cognitive impairment, so stated; Sleeping difficulties; Tobacco user; Cervical myelopathy ([place]); DDD (degenerative disc disease), cervical; Cervical spondylosis with myelopathy; Hypertension; Insomnia secondary to depression with anxiety; Chronic low back pain (Primary Area of Pain) (Bilateral) (L>R) w/ sciatica; Chronic lower extremity pain (Secondary Area of Pain) (Bilateral) (L>R); Chronic upper back pain [facility] Area of Pain) (Bilateral) (R>L); Chronic hand pain (Fourth Area of Pain) (Bilateral) (R>L); Chronic knee pain (Left); Pharmacologic therapy; Disorder of skeletal system; Problems influencing health status; Long term current use of opiate analgesic; Abnormal MRI, cervical spine; Abnormal MRI, lumbar spine; DDD (degenerative disc disease), lumbar; Lumbar facet hypertrophy (Bilateral); Lumbar facet syndrome (Bilateral); Cervical facet hypertrophy (Bilateral); Cervical facet syndrome (Bilateral); Cervical foraminal stenosis; Lumbar foraminal stenosis; Cervical central spinal stenosis; Lumbar central spinal stenosis; Anterolisthesis; and Retrolisthesis on their problem list. Her primarily concern today is the No chief complaint on file.  Pain Assessment: Location:     Radiating:   Onset:   Duration:   Quality:   Severity:  /10 (subjective, self-reported pain score)  Note: Reported level is compatible with observation.                         When using our objective Pain Scale, levels between 6 and 10/10 are said to belong in an emergency room, as it progressively worsens from a 6/10, described as severely limiting, requiring emergency care not usually available at an outpatient pain management facility. At a 6/10 level, communication becomes difficult and requires great effort. Assistance to reach the emergency department may be required. Facial flushing and profuse sweating along with potentially dangerous increases in heart rate and blood pressure will be  evident. Effect on ADL:   Timing:   Modifying factors:   BP:    HR:    Samantha Macias comes in today for a follow-up visit after her initial evaluation on 03/17/2018. Today we went  over the results of her tests. These were explained in "[place]man's terms". During today's appointment we went over my diagnostic impression, as well as the proposed treatment plan.  ***  In considering the treatment plan options, Samantha Macias was reminded that I no longer take patients for medication management only. I asked her to let me know if she had no intention of taking advantage of the interventional therapies, so that we could make arrangements to provide this space to someone interested. I also made it clear that undergoing interventional therapies for the purpose of getting pain medications is very inappropriate on the part of a patient, and it will not be tolerated in this practice. This type of behavior would suggest true addiction and therefore it requires referral to an addiction specialist.   Further details on both, my assessment(s), as well as the proposed treatment plan, please see below.  Controlled Substance Pharmacotherapy Assessment REMS (Risk Evaluation and Mitigation Strategy)  Analgesic: ***  Pill Count: None expected due to no prior prescriptions written by our practice. No notes on file Pharmacokinetics: Liberation and absorption (onset of action): WNL Distribution (time to peak effect): WNL Metabolism and excretion (duration of action): WNL         Pharmacodynamics: Desired effects: Analgesia: Samantha Macias reports >50% benefit. Functional ability: Patient reports that medication allows her to accomplish basic ADLs Clinically meaningful improvement in function (CMIF): Sustained CMIF goals met Perceived effectiveness: Described as relatively effective, allowing for increase in activities of daily living (ADL) Undesirable effects: Side-effects or Adverse reactions: None  reported Monitoring: [place] PMP: Online review of the past 68-monthperiod previously conducted. Not applicable at this point since we have not taken over the patient's medication management yet. List of other Serum/Urine Drug Screening Test(s):  Lab Results  Component Value Date   COCAINSCRNUR POSITIVE (A) 08/07/2017   COCAINSCRNUR POSITIVE (A) 07/17/2017   THCU NONE DETECTED 08/07/2017   THCU NONE DETECTED 07/17/2017   List of all UDS test(s) done:  Lab Results  Component Value Date   SUMMARY FINAL 03/17/2018   Last UDS on record: Summary  Date Value Ref Range Status  03/17/2018 FINAL  Final    Comment:    ==================================================================== TOXASSURE COMP DRUG ANALYSIS,UR ==================================================================== Test                             Result       Flag       Units Drug Present and Declared for Prescription Verification   Tramadol                       1825         EXPECTED   ng/mg creat   O-Desmethyltramadol            929          EXPECTED   ng/mg creat   N-Desmethyltramadol            1352         EXPECTED   ng/mg creat    Source of tramadol is a prescription medication.    O-desmethyltramadol and N-desmethyltramadol are expected    metabolites of tramadol.   Pregabalin                     PRESENT      EXPECTED   Tizanidine  PRESENT      EXPECTED   Bupropion                      PRESENT      EXPECTED   Hydroxybupropion               PRESENT      EXPECTED    Hydroxybupropion is an expected metabolite of bupropion.   Mirtazapine                    PRESENT      EXPECTED   Quetiapine                     PRESENT      EXPECTED   Salicylate                     PRESENT      EXPECTED Drug Present not Declared for Prescription Verification   Benzoylecgonine                494          UNEXPECTED ng/mg creat    Benzoylecgonine is a metabolite of cocaine; its presence    indicates use of this  drug.  Source is most commonly illicit, but    cocaine is present in some topical anesthetic solutions.   Acetaminophen                  PRESENT      UNEXPECTED   Diphenhydramine                PRESENT      UNEXPECTED Drug Absent but Declared for Prescription Verification   Alprazolam                     Not Detected UNEXPECTED ng/mg creat   Sertraline                     Not Detected UNEXPECTED   Diclofenac                     Not Detected UNEXPECTED    Topical diclofenac, as indicated in the declared medication list,    is not always detected even when used as directed.   Ibuprofen                      Not Detected UNEXPECTED    Ibuprofen, as indicated in the declared medication list, is not    always detected even when used as directed. ==================================================================== Test                      Result    Flag   Units      Ref Range   Creatinine              159              mg/dL      >=20 ==================================================================== Declared Medications:  The flagging and interpretation on this report are based on the  following declared medications.  Unexpected results may arise from  inaccuracies in the declared medications.  **Note: The testing scope of this panel includes these medications:  Alprazolam (Xanax)  Bupropion (Wellbutrin)  Mirtazapine (Remeron)  Pregabalin (Lyrica)  Quetiapine (Seroquel)  Sertraline (Zoloft)  Tramadol (Ultram)  **Note: The testing scope of this panel does not include small  to  moderate amounts of these reported medications:  Aspirin (Aspirin 81)  Ibuprofen  Tizanidine (Zanaflex)  Topical Diclofenac  **Note: The testing scope of this panel does not include following  reported medications:  Albuterol  Donepezil (Aricept)  Formoterol (Perforomist)  Hydrochlorothiazide (Hyzaar)  Ipratropium  Losartan (Hyzaar)  Omeprazole (Prilosec)  Pantoprazole (Protonix)  Ramelteon (Rozerem)   Tiotropium (Spiriva)  Vitamin D ==================================================================== For clinical consultation, please call [phone]. ====================================================================    UDS interpretation: No unexpected findings.          Medication Assessment Form: Patient introduced to form today Treatment compliance: Treatment may start today if patient agrees with proposed plan. Evaluation of compliance is not applicable at this point Risk Assessment Profile: Aberrant behavior: See initial evaluations. None observed or detected today Comorbid factors increasing risk of overdose: See initial evaluation. No additional risks detected today Opioid risk tool (ORT):  Opioid Risk  03/17/2018  Alcohol 1  Illegal Drugs 2  Rx Drugs 0  Alcohol 0  Illegal Drugs 0  Rx Drugs 0  Age between 16-45 years  0  History of Preadolescent Sexual Abuse 0  Psychological Disease 2  Depression 0  Opioid Risk Tool Scoring 5  Opioid Risk Interpretation Moderate Risk    ORT Scoring interpretation table:  Score <3 = Low Risk for SUD  Score between 4-7 = Moderate Risk for SUD  Score >8 = High Risk for Opioid Abuse   Risk of substance use disorder (SUD): Low  Risk Mitigation Strategies:  Patient opioid safety counseling: Completed today. Counseling provided to patient as per "Patient Counseling Document". Document signed by patient, attesting to counseling and understanding Patient-Prescriber Agreement (PPA): Obtained today.  Controlled substance notification to other providers: Written and sent today.  Pharmacologic Plan: Today we may be taking over the patient's pharmacological regimen. See below.             Laboratory Chemistry  Inflammation Markers (CRP: Acute Phase) (ESR: Chronic Phase) Lab Results  Component Value Date   CRP 17 (H) 03/17/2018   ESRSEDRATE 28 03/17/2018                         Rheumatology Markers No results found for: RF, ANA,  LABURIC, URICUR, LYMEIGGIGMAB, LYMEABIGMQN, HLAB27                      Renal Function Markers Lab Results  Component Value Date   BUN 22 03/17/2018   CREATININE 1.35 (H) 03/17/2018   BCR 16 03/17/2018   GFRAA 51 (L) 03/17/2018   GFRNONAA 44 (L) 03/17/2018                             Hepatic Function Markers Lab Results  Component Value Date   AST 13 03/17/2018   ALT 15 12/24/2017   ALBUMIN 4.4 03/17/2018   ALKPHOS 148 (H) 03/17/2018   LIPASE 37 12/24/2017   AMMONIA 23 07/07/2015                        Electrolytes Lab Results  Component Value Date   NA 141 03/17/2018   K 4.3 03/17/2018   CL 99 03/17/2018   CALCIUM 9.1 03/17/2018   MG 1.8 03/17/2018                        Neuropathy Markers Lab  Results  Component Value Date   VITAMINB12 268 03/17/2018   HGBA1C 5.6 07/06/2015                        CNS Tests No results found for: COLORCSF, APPEARCSF, RBCCOUNTCSF, WBCCSF, POLYSCSF, LYMPHSCSF, EOSCSF, PROTEINCSF, GLUCCSF, JCVIRUS, CSFOLI, IGGCSF                      Bone Pathology Markers Lab Results  Component Value Date   25OHVITD1 41 03/17/2018   25OHVITD2 <1.0 03/17/2018   25OHVITD3 41 03/17/2018                         Coagulation Parameters Lab Results  Component Value Date   INR 0.91 07/10/2017   LABPROT 12.2 07/10/2017   APTT 27 07/10/2017   PLT 224 12/24/2017                        Cardiovascular Markers Lab Results  Component Value Date   BNP 135.0 (H) 11/08/2016   CKTOTAL 47 02/10/2014   CKMB 0.6 02/10/2014   TROPONINI <0.03 11/08/2016   HGB 13.4 12/24/2017   HCT 38.5 12/24/2017                         CA Markers No results found for: CEA, CA125, LABCA2                      Endocrine Markers Lab Results  Component Value Date   TSH 0.49 02/23/2014                        Note: Lab results reviewed.  Recent Diagnostic Imaging Review  Cervical Imaging: Cervical MR wo contrast:  Results for orders placed during the hospital  encounter of 05/17/17  MR CERVICAL SPINE WO CONTRAST   Narrative CLINICAL DATA:  58 year old female with chronic cervical neck pain. Recent pain radiation down the left arm with numbness from the elbow distally. Chronic lumbar back pain progressive for 1 month. Frequent falls. Painful sitting and standing straight.  EXAM: MRI CERVICAL AND LUMBAR SPINE WITHOUT CONTRAST  TECHNIQUE: Multiplanar and multiecho pulse sequences of the cervical spine, to include the craniocervical junction and cervicothoracic junction, and lumbar spine, were obtained without intravenous contrast.  COMPARISON:  Brain MRI 07/27/2015. Cervical spine MRI 10/11/2009. Lumbar MRI 03/15/2016. CT Abdomen and Pelvis 12/22/2013.  FINDINGS: MRI CERVICAL SPINE FINDINGS  Alignment: Chronic straightening of cervical lordosis. Mild anterolisthesis of C3 on C4 appears stable but similar mild degenerative appearing anterolisthesis of C4 on C5 has developed since 2011. Subtle anterolisthesis of C5 on C6.  Vertebrae: There is mild degenerative appearing marrow edema in the C5 lamina posteriorly in the midline (series 5, image 7). This is new compared to 2011. Background bone marrow signal is normal. Chronic but increased degenerative endplate irregularity inferiorly at C6. No other marrow edema or acute osseous abnormality.  Cord: Spinal cord signal is within normal limits at all visualized levels.  Posterior Fossa, vertebral arteries, paraspinal tissues: Negative visualized posterior fossa. Cervicomedullary junction is within normal limits. Preserved major vascular flow voids in the neck. Negative neck soft tissues.  Disc levels:  C2-C3: Moderate to severe facet hypertrophy greater on the right has developed since 2011. Right foraminal disc bulge and endplate spurring. No spinal stenosis, but moderate right C3 neural  foraminal stenosis is new from the prior MRI.  C3-C4: Moderate to severe facet hypertrophy  greater on the right has progressed since 2011. Mild foraminal disc bulging and endplate spurring greater on the right. Mild left and moderate to severe right C4 foraminal stenosis have increased since 2011.  C4-C5: Chronic left side facet hypertrophy, but moderate to severe facet degeneration has progressed since 2011. Left greater than right foraminal disc bulging and endplate spurring. Mild left ligament flavum hypertrophy. No spinal stenosis. Severe left and mild to moderate right C5 foraminal stenosis have progressed.  C5-C6: Mild facet hypertrophy has increased on the right. Mild circumferential disc bulge with foraminal disc and endplate spurring. No spinal stenosis. Increased borderline to mild left and moderate right C6 foraminal stenosis.  C6-C7: Disc space loss since 2011. Circumferential disc bulge and endplate spurring with broad-based posterior and biforaminal component. Mild ligament flavum hypertrophy. Mild spinal stenosis. Borderline to mild spinal cord mass effect. Moderate to severe bilateral C7 neural foraminal stenosis. This level has progressed since 2011.  C7-T1: Progressed moderate facet hypertrophy greater on the left. Mild foraminal endplate spurring. Increased mild left C8 foraminal stenosis.  No upper thoracic spinal or foraminal stenosis.  MRI LUMBAR SPINE FINDINGS  Segmentation: Normal as demonstrated on the 2015 CT, which is the same numbering system used on the 2017 MRI.  Alignment: Stable vertebral height and alignment since 2017. Mild retrolisthesis at L5-S1. Relatively preserved lumbar lordosis; mild reversal at L1-L2.  Vertebrae: Chronic but perhaps mildly increased degenerative appearing endplate marrow edema posteriorly and to the left at L5-S1.  New degenerative appearing marrow edema throughout the chronically degenerated right L4 and L5 facets (with some pedicle involvement). See series 12, images 3 and 4).  Background bone marrow  signal remains normal. There is trace degenerative endplate edema anteriorly superiorly at L3. No other acute osseous abnormality. Intact visible sacrum and SI joints.  Conus medullaris and cauda equina: Conus extends to the T12-L1 level. Conus and cauda equina appear normal. Fairly capacious spinal canal.  Paraspinal and other soft tissues: Negative.  Disc levels:  T12-L1:  Negative.  L1-L2: Chronic disc desiccation and disc space loss. Stable mild right paracentral disc protrusion. No stenosis.  L2-L3: Mild chronic disc bulge, most affecting the left neural foramen. Small left foraminal annular fissure of the disc is increased in conspicuity on series 10, image 12. Stable mild facet hypertrophy. Mild left L2 neural foraminal stenosis is stable.  L3-L4: Chronic disc desiccation and mostly far lateral disc bulging. Mild facet hypertrophy. No stenosis.  L4-L5: Chronic circumferential disc bulge. Broad-based bilateral foraminal involvement with endplate spurring far laterally on the right. Possible chronic left foraminal annular fissure. Moderate to severe facet hypertrophy. Increased bilateral facet joint fluid, greater on the right. Borderline to mild spinal stenosis and right lateral recess stenosis (descending right L5 nerve level). Mild bilateral L4 foraminal stenosis. This level has progressed since 2017.  L5-S1: Chronic circumferential disc bulge and endplate spurring eccentric to the left and most affecting the left neural foramen. Mild to moderate facet hypertrophy is stable. Increased mild epidural lipomatosis. No spinal or lateral recess stenosis. Moderate left L5 foraminal stenosis is stable.  IMPRESSION: CERVICAL SPINE:  1. Progressed cervical spine degeneration since 2011. Multilevel mild spondylolisthesis associated with increased moderate to severe facet hypertrophy at multiple levels. Associated degenerative appearing mild marrow edema in the right C5  lamina. Subsequent new or increased neural foraminal stenosis which is moderate or severe at the right C3, right C4, left  C5, right C6 and bilateral C7 nerve levels. 2. Multifactorial mild cervical spinal stenosis at C6-C7 with up to mild spinal cord mass effect, and the bilateral C7 foraminal stenosis. No spinal cord signal abnormality.  LUMBAR SPINE:  1. Chronic facet degeneration at L4 and L5 has progressed since the 2017 MRI and is severe, with associated new degenerative marrow edema. Increased multifactorial mild spinal, right lateral recess, and bilateral neural foraminal stenosis at that level. 2. Other lumbar levels appear stable since 2017, including chronic disc and endplate degeneration eccentric to the left at L5-S1.   Electronically Signed   By: Genevie Ann M.D.   On: 05/17/2017 12:17    Cervical MR wo contrast: No procedure found. Cervical MR w/wo contrast: No results found for this or any previous visit. Cervical MR w contrast: No results found for this or any previous visit. Cervical CT wo contrast: No results found for this or any previous visit. Cervical CT w/wo contrast: No results found for this or any previous visit. Cervical CT w/wo contrast: No results found for this or any previous visit. Cervical CT w contrast: No results found for this or any previous visit. Cervical CT outside: No results found for this or any previous visit. Cervical DG 1 view: No results found for this or any previous visit. Cervical DG 2-3 views: No results found for this or any previous visit. Cervical DG F/E views: No results found for this or any previous visit. Cervical DG 2-3 clearing views: No results found for this or any previous visit. Cervical DG Bending/F/E views: No results found for this or any previous visit. Cervical DG complete: No results found for this or any previous visit. Cervical DG Myelogram views: No results found for this or any previous visit. Cervical DG  Myelogram views: No results found for this or any previous visit. Cervical Discogram views: No results found for this or any previous visit.  Shoulder Imaging: Shoulder-R MR w contrast: No results found for this or any previous visit. Shoulder-L MR w contrast: No results found for this or any previous visit. Shoulder-R MR w/wo contrast: No results found for this or any previous visit. Shoulder-L MR w/wo contrast: No results found for this or any previous visit. Shoulder-R MR wo contrast: No results found for this or any previous visit. Shoulder-L MR wo contrast: No results found for this or any previous visit. Shoulder-R CT w contrast: No results found for this or any previous visit. Shoulder-L CT w contrast: No results found for this or any previous visit. Shoulder-R CT w/wo contrast: No results found for this or any previous visit. Shoulder-L CT w/wo contrast: No results found for this or any previous visit. Shoulder-R CT wo contrast: No results found for this or any previous visit. Shoulder-L CT wo contrast: No results found for this or any previous visit. Shoulder-R DG Arthrogram: No results found for this or any previous visit. Shoulder-L DG Arthrogram: No results found for this or any previous visit. Shoulder-R DG 1 view: No results found for this or any previous visit. Shoulder-L DG 1 view: No results found for this or any previous visit. Shoulder-R DG: No results found for this or any previous visit. Shoulder-L DG: No results found for this or any previous visit.  Thoracic Imaging: Thoracic MR wo contrast: No results found for this or any previous visit. Thoracic MR wo contrast: No procedure found. Thoracic MR w/wo contrast: No results found for this or any previous visit. Thoracic MR w  contrast: No results found for this or any previous visit. Thoracic CT wo contrast: No results found for this or any previous visit. Thoracic CT w/wo contrast: No results found for this or any  previous visit. Thoracic CT w/wo contrast: No results found for this or any previous visit. Thoracic CT w contrast: No results found for this or any previous visit. Thoracic DG 2-3 views: No results found for this or any previous visit. Thoracic DG 4 views: No results found for this or any previous visit. Thoracic DG: No results found for this or any previous visit. Thoracic DG w/swimmers view: No results found for this or any previous visit. Thoracic DG Myelogram views: No results found for this or any previous visit. Thoracic DG Myelogram views: No results found for this or any previous visit.  Lumbosacral Imaging: Lumbar MR wo contrast:  Results for orders placed during the hospital encounter of 05/17/17  MR LUMBAR SPINE WO CONTRAST   Narrative CLINICAL DATA:  59 year old female with chronic cervical neck pain. Recent pain radiation down the left arm with numbness from the elbow distally. Chronic lumbar back pain progressive for 1 month. Frequent falls. Painful sitting and standing straight.  EXAM: MRI CERVICAL AND LUMBAR SPINE WITHOUT CONTRAST  TECHNIQUE: Multiplanar and multiecho pulse sequences of the cervical spine, to include the craniocervical junction and cervicothoracic junction, and lumbar spine, were obtained without intravenous contrast.  COMPARISON:  Brain MRI 07/27/2015. Cervical spine MRI 10/11/2009. Lumbar MRI 03/15/2016. CT Abdomen and Pelvis 12/22/2013.  FINDINGS: MRI CERVICAL SPINE FINDINGS  Alignment: Chronic straightening of cervical lordosis. Mild anterolisthesis of C3 on C4 appears stable but similar mild degenerative appearing anterolisthesis of C4 on C5 has developed since 2011. Subtle anterolisthesis of C5 on C6.  Vertebrae: There is mild degenerative appearing marrow edema in the C5 lamina posteriorly in the midline (series 5, image 7). This is new compared to 2011. Background bone marrow signal is normal. Chronic but increased degenerative  endplate irregularity inferiorly at C6. No other marrow edema or acute osseous abnormality.  Cord: Spinal cord signal is within normal limits at all visualized levels.  Posterior Fossa, vertebral arteries, paraspinal tissues: Negative visualized posterior fossa. Cervicomedullary junction is within normal limits. Preserved major vascular flow voids in the neck. Negative neck soft tissues.  Disc levels:  C2-C3: Moderate to severe facet hypertrophy greater on the right has developed since 2011. Right foraminal disc bulge and endplate spurring. No spinal stenosis, but moderate right C3 neural foraminal stenosis is new from the prior MRI.  C3-C4: Moderate to severe facet hypertrophy greater on the right has progressed since 2011. Mild foraminal disc bulging and endplate spurring greater on the right. Mild left and moderate to severe right C4 foraminal stenosis have increased since 2011.  C4-C5: Chronic left side facet hypertrophy, but moderate to severe facet degeneration has progressed since 2011. Left greater than right foraminal disc bulging and endplate spurring. Mild left ligament flavum hypertrophy. No spinal stenosis. Severe left and mild to moderate right C5 foraminal stenosis have progressed.  C5-C6: Mild facet hypertrophy has increased on the right. Mild circumferential disc bulge with foraminal disc and endplate spurring. No spinal stenosis. Increased borderline to mild left and moderate right C6 foraminal stenosis.  C6-C7: Disc space loss since 2011. Circumferential disc bulge and endplate spurring with broad-based posterior and biforaminal component. Mild ligament flavum hypertrophy. Mild spinal stenosis. Borderline to mild spinal cord mass effect. Moderate to severe bilateral C7 neural foraminal stenosis. This level has progressed since  2011.  C7-T1: Progressed moderate facet hypertrophy greater on the left. Mild foraminal endplate spurring. Increased mild left C8  foraminal stenosis.  No upper thoracic spinal or foraminal stenosis.  MRI LUMBAR SPINE FINDINGS  Segmentation: Normal as demonstrated on the 2015 CT, which is the same numbering system used on the 2017 MRI.  Alignment: Stable vertebral height and alignment since 2017. Mild retrolisthesis at L5-S1. Relatively preserved lumbar lordosis; mild reversal at L1-L2.  Vertebrae: Chronic but perhaps mildly increased degenerative appearing endplate marrow edema posteriorly and to the left at L5-S1.  New degenerative appearing marrow edema throughout the chronically degenerated right L4 and L5 facets (with some pedicle involvement). See series 12, images 3 and 4).  Background bone marrow signal remains normal. There is trace degenerative endplate edema anteriorly superiorly at L3. No other acute osseous abnormality. Intact visible sacrum and SI joints.  Conus medullaris and cauda equina: Conus extends to the T12-L1 level. Conus and cauda equina appear normal. Fairly capacious spinal canal.  Paraspinal and other soft tissues: Negative.  Disc levels:  T12-L1:  Negative.  L1-L2: Chronic disc desiccation and disc space loss. Stable mild right paracentral disc protrusion. No stenosis.  L2-L3: Mild chronic disc bulge, most affecting the left neural foramen. Small left foraminal annular fissure of the disc is increased in conspicuity on series 10, image 12. Stable mild facet hypertrophy. Mild left L2 neural foraminal stenosis is stable.  L3-L4: Chronic disc desiccation and mostly far lateral disc bulging. Mild facet hypertrophy. No stenosis.  L4-L5: Chronic circumferential disc bulge. Broad-based bilateral foraminal involvement with endplate spurring far laterally on the right. Possible chronic left foraminal annular fissure. Moderate to severe facet hypertrophy. Increased bilateral facet joint fluid, greater on the right. Borderline to mild spinal stenosis and right lateral recess  stenosis (descending right L5 nerve level). Mild bilateral L4 foraminal stenosis. This level has progressed since 2017.  L5-S1: Chronic circumferential disc bulge and endplate spurring eccentric to the left and most affecting the left neural foramen. Mild to moderate facet hypertrophy is stable. Increased mild epidural lipomatosis. No spinal or lateral recess stenosis. Moderate left L5 foraminal stenosis is stable.  IMPRESSION: CERVICAL SPINE:  1. Progressed cervical spine degeneration since 2011. Multilevel mild spondylolisthesis associated with increased moderate to severe facet hypertrophy at multiple levels. Associated degenerative appearing mild marrow edema in the right C5 lamina. Subsequent new or increased neural foraminal stenosis which is moderate or severe at the right C3, right C4, left C5, right C6 and bilateral C7 nerve levels. 2. Multifactorial mild cervical spinal stenosis at C6-C7 with up to mild spinal cord mass effect, and the bilateral C7 foraminal stenosis. No spinal cord signal abnormality.  LUMBAR SPINE:  1. Chronic facet degeneration at L4 and L5 has progressed since the 2017 MRI and is severe, with associated new degenerative marrow edema. Increased multifactorial mild spinal, right lateral recess, and bilateral neural foraminal stenosis at that level. 2. Other lumbar levels appear stable since 2017, including chronic disc and endplate degeneration eccentric to the left at L5-S1.   Electronically Signed   By: Genevie Ann M.D.   On: 05/17/2017 12:17    Lumbar MR wo contrast: No procedure found. Lumbar MR w/wo contrast: No results found for this or any previous visit. Lumbar MR w contrast: No results found for this or any previous visit. Lumbar CT wo contrast: No results found for this or any previous visit. Lumbar CT w/wo contrast: No results found for this or any previous visit. Lumbar  CT w/wo contrast: No results found for this or any previous  visit. Lumbar CT w contrast: No results found for this or any previous visit. Lumbar DG 1V: No results found for this or any previous visit. Lumbar DG 1V (Clearing): No results found for this or any previous visit. Lumbar DG 2-3V (Clearing): No results found for this or any previous visit. Lumbar DG 2-3 views: No results found for this or any previous visit.       Lumbar DG (Complete) 4+V: No results found for this or any previous visit.       Lumbar DG F/E views: No results found for this or any previous visit.       Lumbar DG Bending views: No results found for this or any previous visit.       Lumbar DG Myelogram views: No results found for this or any previous visit. Lumbar DG Myelogram: No results found for this or any previous visit. Lumbar DG Myelogram: No results found for this or any previous visit. Lumbar DG Myelogram: No results found for this or any previous visit. Lumbar DG Myelogram Lumbosacral: No results found for this or any previous visit. Lumbar DG Diskogram views: No results found for this or any previous visit. Lumbar DG Diskogram views: No results found for this or any previous visit. Lumbar DG Epidurogram OP: No results found for this or any previous visit. Lumbar DG Epidurogram IP: No results found for this or any previous visit.  Sacroiliac Joint Imaging: Sacroiliac Joint DG: No results found for this or any previous visit. Sacroiliac Joint MR w/wo contrast: No results found for this or any previous visit. Sacroiliac Joint MR wo contrast: No results found for this or any previous visit.  Spine Imaging: Whole Spine DG Myelogram views: No results found for this or any previous visit. Whole Spine MR Mets screen: No results found for this or any previous visit. Whole Spine MR Mets screen: No results found for this or any previous visit. Whole Spine MR w/wo: No results found for this or any previous visit. MRA Spinal Canal w/ cm: No results found for this or any  previous visit. MRA Spinal Canal wo/ cm: No procedure found. MRA Spinal Canal w/wo cm: No results found for this or any previous visit. Spine Outside MR Films: No results found for this or any previous visit. Spine Outside CT Films: No results found for this or any previous visit. CT-Guided Biopsy: No results found for this or any previous visit. CT-Guided Needle Placement: No results found for this or any previous visit. DG Spine outside: No results found for this or any previous visit. IR Spine outside: No results found for this or any previous visit. NM Spine outside: No results found for this or any previous visit. Epidurography 1: No results found for this or any previous visit. Epidurography 2: No results found for this or any previous visit.  Hip Imaging: Hip-R MR w contrast: No results found for this or any previous visit. Hip-L MR w contrast: No results found for this or any previous visit. Hip-R MR w/wo contrast: No results found for this or any previous visit. Hip-L MR w/wo contrast: No results found for this or any previous visit. Hip-R MR wo contrast: No results found for this or any previous visit. Hip-L MR wo contrast: No results found for this or any previous visit. Hip-R CT w contrast: No results found for this or any previous visit. Hip-L CT w contrast: No results  found for this or any previous visit. Hip-R CT w/wo contrast: No results found for this or any previous visit. Hip-L CT w/wo contrast: No results found for this or any previous visit. Hip-R CT wo contrast: No results found for this or any previous visit. Hip-L CT wo contrast: No results found for this or any previous visit. Hip-R DG 2-3 views: No results found for this or any previous visit. Hip-L DG 2-3 views:  Results for orders placed during the hospital encounter of 11/28/15  DG HIP UNILAT WITH PELVIS 2-3 VIEWS LEFT   Narrative CLINICAL DATA:  Fall last night.  Left hip pain.  EXAM: DG HIP (WITH OR  WITHOUT PELVIS) 2-3V LEFT  COMPARISON:  None.  FINDINGS: No acute bony abnormality. Specifically, no fracture, subluxation, or dislocation. Soft tissues are intact. SI joints and hip joints are symmetric and unremarkable.  IMPRESSION: Negative.   Electronically Signed   By: Rolm Baptise M.D.   On: 11/28/2015 11:38    Hip-R DG Arthrogram: No results found for this or any previous visit. Hip-L DG Arthrogram: No results found for this or any previous visit. Hip-B DG Bilateral: No results found for this or any previous visit.  Knee Imaging: Knee-R MR w contrast: No results found for this or any previous visit. Knee-L MR w contrast: No results found for this or any previous visit. Knee-R MR w/wo contrast: No results found for this or any previous visit. Knee-L MR w/wo contrast: No results found for this or any previous visit. Knee-R MR wo contrast: No results found for this or any previous visit. Knee-L MR wo contrast: No results found for this or any previous visit. Knee-R CT w contrast: No results found for this or any previous visit. Knee-L CT w contrast: No results found for this or any previous visit. Knee-R CT w/wo contrast: No results found for this or any previous visit. Knee-L CT w/wo contrast: No results found for this or any previous visit. Knee-R CT wo contrast: No results found for this or any previous visit. Knee-L CT wo contrast: No results found for this or any previous visit. Knee-R DG 1-2 views: No results found for this or any previous visit. Knee-L DG 1-2 views: No results found for this or any previous visit. Knee-R DG 3 views: No results found for this or any previous visit. Knee-L DG 3 views: No results found for this or any previous visit. Knee-R DG 4 views: No results found for this or any previous visit. Knee-L DG 4 views:  Results for orders placed during the hospital encounter of 11/28/15  DG Knee Complete 4 Views Left   Narrative CLINICAL DATA:  Fall  yesterday with persistent left knee pain, initial encounter  EXAM: LEFT KNEE - COMPLETE 4+ VIEW  COMPARISON:  None.  FINDINGS: No evidence of fracture, dislocation, or joint effusion. No evidence of arthropathy or other focal bone abnormality. Soft tissues are unremarkable.  IMPRESSION: No acute abnormality noted.   Electronically Signed   By: Inez Catalina M.D.   On: 11/28/2015 11:28    Knee-R DG Arthrogram: No results found for this or any previous visit. Knee-L DG Arthrogram: No results found for this or any previous visit.  Ankle Imaging: Ankle-R DG Complete: No results found for this or any previous visit. Ankle-L DG Complete: No results found for this or any previous visit.  Foot Imaging: Foot-R DG Complete:  Results for orders placed during the hospital encounter of 07/30/18  DG Foot  Complete Right   Narrative CLINICAL DATA:  Right foot pain after injury last night.  EXAM: RIGHT FOOT COMPLETE - 3+ VIEW  COMPARISON:  Radiographs of December 10, 2016.  FINDINGS: There is no evidence of fracture or dislocation. Stable degenerative changes seen involving first metatarsophalangeal joint. Mild posterior calcaneal spurring is noted. Soft tissues are unremarkable.  IMPRESSION: No acute abnormality seen in the right foot.   Electronically Signed   By: Marijo Conception, M.D.   On: 07/30/2018 11:01    Foot-L DG Complete: No results found for this or any previous visit.  Elbow Imaging: Elbow-R DG Complete: No results found for this or any previous visit. Elbow-L DG Complete: No results found for this or any previous visit.  Wrist Imaging: Wrist-R DG Complete: No results found for this or any previous visit. Wrist-L DG Complete: No results found for this or any previous visit.  Hand Imaging: Hand-R DG Complete: No results found for this or any previous visit. Hand-L DG Complete: No results found for this or any previous visit.  Complexity Note: Imaging results  reviewed. Results shared with Samantha Macias, using Layman's terms.                         Meds   Current Outpatient Medications:  .  albuterol (PROVENTIL HFA;VENTOLIN HFA) 108 (90 Base) MCG/ACT inhaler, Inhale 2 puffs into the lungs every 6 (six) hours as needed for wheezing or shortness of breath., Disp: , Rfl:  .  ALPRAZolam (XANAX) 0.5 MG tablet, Take 0.5-0.75 mg by mouth at bedtime as needed for anxiety., Disp: , Rfl:  .  aspirin EC 81 MG tablet, Take 1 tablet (81 mg total) by mouth at bedtime., Disp: , Rfl:  .  buPROPion (WELLBUTRIN XL) 300 MG 24 hr tablet, Take 300 mg by mouth daily., Disp: , Rfl: 1 .  Cholecalciferol (VITAMIN D) 2000 units tablet, Take 2,000 Units by mouth daily., Disp: , Rfl:  .  diclofenac sodium (VOLTAREN) 1 % GEL, Apply 4 g topically 4 (four) times daily as needed. (Patient taking differently: Apply 4 g topically 4 (four) times daily as needed. For pain), Disp: 100 g, Rfl: 5 .  donepezil (ARICEPT) 10 MG tablet, Take 10 mg by mouth at bedtime., Disp: , Rfl:  .  formoterol (PERFOROMIST) 20 MCG/2ML nebulizer solution, Take 20 mcg by nebulization 2 (two) times daily., Disp: , Rfl:  .  ibuprofen (ADVIL,MOTRIN) 600 MG tablet, Take 1 tablet (600 mg total) by mouth every 6 (six) hours as needed., Disp: 30 tablet, Rfl: 0 .  ipratropium (ATROVENT) 0.02 % nebulizer solution, Take 0.5 mg by nebulization 3 (three) times daily., Disp: , Rfl:  .  losartan-hydrochlorothiazide (HYZAAR) 100-12.5 MG tablet, Take 1 tablet by mouth daily., Disp: 90 tablet, Rfl: 1 .  mirtazapine (REMERON) 15 MG tablet, Take 15 mg by mouth at bedtime., Disp: , Rfl: 5 .  omeprazole (PRILOSEC) 20 MG capsule, Take 20 mg by mouth daily., Disp: , Rfl:  .  pantoprazole (PROTONIX) 40 MG tablet, Take 40 mg by mouth daily., Disp: , Rfl: 1 .  pregabalin (LYRICA) 200 MG capsule, Take 1 capsule (200 mg total) by mouth 3 (three) times daily., Disp: 30 capsule, Rfl: 0 .  QUEtiapine (SEROQUEL) 100 MG tablet, Take 150 mg by  mouth at bedtime., Disp: , Rfl: 0 .  ramelteon (ROZEREM) 8 MG tablet, Take 1 tablet (8 mg total) by mouth at bedtime., Disp: 90 tablet, Rfl:  1 .  sertraline (ZOLOFT) 50 MG tablet, Take 50 mg by mouth daily., Disp: , Rfl: 5 .  SPIRIVA HANDIHALER 18 MCG inhalation capsule, Place 1 puff into inhaler and inhale daily., Disp: , Rfl: 0 .  tiZANidine (ZANAFLEX) 2 MG tablet, Take 1 tablet (2 mg total) by mouth every 6 (six) hours as needed for muscle spasms. (Patient taking differently: Take 4 mg by mouth every 6 (six) hours as needed for muscle spasms. ), Disp: 15 tablet, Rfl: 5 .  traMADol (ULTRAM) 50 MG tablet, Take 1 tablet (50 mg total) by mouth every 8 (eight) hours as needed. (Patient taking differently: Take 50 mg by mouth every 6 (six) hours as needed for moderate pain. ), Disp: 90 tablet, Rfl: 0 .  traMADol (ULTRAM) 50 MG tablet, Take 1 tablet (50 mg total) by mouth every 6 (six) hours as needed., Disp: 20 tablet, Rfl: 0  ROS  Constitutional: Denies any fever or chills Gastrointestinal: No reported hemesis, hematochezia, vomiting, or acute GI distress Musculoskeletal: Denies any acute onset joint swelling, redness, loss of ROM, or weakness Neurological: No reported episodes of acute onset apraxia, aphasia, dysarthria, agnosia, amnesia, paralysis, loss of coordination, or loss of consciousness  Allergies  Samantha Macias is allergic to shellfish allergy; tetanus toxoid adsorbed; ace inhibitors; codeine; nitrofurantoin monohyd macro; and red dye.  [place]  Drug: Samantha Macias  reports current drug use. Drug: Cocaine. Alcohol:  reports no history of alcohol use. Tobacco:  reports that she has been smoking e-cigarettes. She has a 6.25 pack-year smoking history. She has never used smokeless tobacco. Medical:  has a past medical history of Abnormal gait, Allergy, Anxiety, Arthritis, Back pain, Bipolar disorder ([place]), CAD (coronary artery disease), Chronic kidney disease, Collagen vascular disease ([place]), Colon  polyp, COPD (chronic obstructive pulmonary disease) ([place]), [place], Fibromyalgia, GERD (gastroesophageal reflux disease), Hyperlipidemia, Hypertension, IBS (irritable bowel syndrome), Kidney disease, Migraine, Numbness and tingling, Renal insufficiency, Spinal stenosis, Static encephalopathy, Thyroid nodule (07/2017), and TIA (transient ischemic attack). Surgical: Samantha Macias  has a past surgical history that includes Tubal ligation; Nasal sinus surgery; Colonoscopy with propofol (N/A, 04/01/2017); Esophagogastroduodenoscopy (egd) with propofol (N/A, 04/01/2017); and Mouth surgery. Family: family history includes Alcohol abuse in her father; Anxiety disorder in her mother; Bipolar disorder in her mother; Congestive Heart Failure in her father; Depression in her mother; Heart attack in her father; Heart disease in her father and mother; Mental illness in her mother; Peripheral vascular disease in her mother; Thyroid disease in her mother.  Constitutional Exam  General appearance: Well nourished, well developed, and well hydrated. In no apparent acute distress There were no vitals filed for this visit. BMI Assessment: Estimated body mass index is 30.87 kg/m as calculated from the following:   Height as of 07/30/18: '5\' 8"'$  (1.727 m).   Weight as of 07/30/18: 203 lb (92.1 kg).  BMI interpretation table: BMI level Category Range association with higher incidence of chronic pain  <18 kg/m2 Underweight   18.5-24.9 kg/m2 Ideal body weight   25-29.9 kg/m2 Overweight Increased incidence by 20%  30-34.9 kg/m2 Obese (Class I) Increased incidence by 68%  35-39.9 kg/m2 Severe obesity (Class II) Increased incidence by 136%  >40 kg/m2 Extreme obesity (Class III) Increased incidence by 254%   Patient's current BMI Ideal Body weight  There is no height or weight on file to calculate BMI. Ideal body weight: 63.9 kg (140 lb 14 oz) Adjusted ideal body weight: 75.2 kg (165 lb 11.6 oz)   BMI Readings from  Last 4  Encounters:  07/30/18 30.87 kg/m  03/17/18 30.71 kg/m  12/24/17 30.41 kg/m  07/17/17 29.80 kg/m   Wt Readings from Last 4 Encounters:  07/30/18 203 lb (92.1 kg)  03/17/18 202 lb (91.6 kg)  12/24/17 200 lb (90.7 kg)  07/17/17 196 lb (88.9 kg)  Psych/Mental status: Alert, oriented x 3 (person, place, & time)       Eyes: PERLA Respiratory: No evidence of acute respiratory distress  Cervical Spine Area Exam  Skin & Axial Inspection: No masses, redness, edema, swelling, or associated skin lesions Alignment: Symmetrical Functional ROM: Unrestricted ROM      Stability: No instability detected Muscle Tone/Strength: Functionally intact. No obvious neuro-muscular anomalies detected. Sensory (Neurological): Unimpaired Palpation: No palpable anomalies              Upper Extremity (UE) Exam    Side: Right upper extremity  Side: Left upper extremity  Skin & Extremity Inspection: Skin color, temperature, and hair growth are WNL. No peripheral edema or cyanosis. No masses, redness, swelling, asymmetry, or associated skin lesions. No contractures.  Skin & Extremity Inspection: Skin color, temperature, and hair growth are WNL. No peripheral edema or cyanosis. No masses, redness, swelling, asymmetry, or associated skin lesions. No contractures.  Functional ROM: Unrestricted ROM          Functional ROM: Unrestricted ROM          Muscle Tone/Strength: Functionally intact. No obvious neuro-muscular anomalies detected.  Muscle Tone/Strength: Functionally intact. No obvious neuro-muscular anomalies detected.  Sensory (Neurological): Unimpaired          Sensory (Neurological): Unimpaired          Palpation: No palpable anomalies              Palpation: No palpable anomalies              Provocative Test(s):  Phalen's test: deferred Tinel's test: deferred Apley's scratch test (touch opposite shoulder):  Action 1 (Across chest): deferred Action 2 (Overhead): deferred Action 3 (LB reach): deferred    Provocative Test(s):  Phalen's test: deferred Tinel's test: deferred Apley's scratch test (touch opposite shoulder):  Action 1 (Across chest): deferred Action 2 (Overhead): deferred Action 3 (LB reach): deferred    Thoracic Spine Area Exam  Skin & Axial Inspection: No masses, redness, or swelling Alignment: Symmetrical Functional ROM: Unrestricted ROM Stability: No instability detected Muscle Tone/Strength: Functionally intact. No obvious neuro-muscular anomalies detected. Sensory (Neurological): Unimpaired Muscle strength & Tone: No palpable anomalies  Lumbar Spine Area Exam  Skin & Axial Inspection: No masses, redness, or swelling Alignment: Symmetrical Functional ROM: Unrestricted ROM       Stability: No instability detected Muscle Tone/Strength: Functionally intact. No obvious neuro-muscular anomalies detected. Sensory (Neurological): Unimpaired Palpation: No palpable anomalies       Provocative Tests: Hyperextension/rotation test: deferred today       Lumbar quadrant test (Kemp's test): deferred today       Lateral bending test: deferred today       Patrick's Maneuver: deferred today                   FABER* test: deferred today                   S-I anterior distraction/compression test: deferred today         S-I lateral compression test: deferred today         S-I Thigh-thrust test: deferred today  S-I Gaenslen's test: deferred today         *(Flexion, ABduction and External Rotation)  Gait & Posture Assessment  Ambulation: Unassisted Gait: Relatively normal for age and body habitus Posture: WNL   Lower Extremity Exam    Side: Right lower extremity  Side: Left lower extremity  Stability: No instability observed          Stability: No instability observed          Skin & Extremity Inspection: Skin color, temperature, and hair growth are WNL. No peripheral edema or cyanosis. No masses, redness, swelling, asymmetry, or associated skin lesions. No  contractures.  Skin & Extremity Inspection: Skin color, temperature, and hair growth are WNL. No peripheral edema or cyanosis. No masses, redness, swelling, asymmetry, or associated skin lesions. No contractures.  Functional ROM: Unrestricted ROM                  Functional ROM: Unrestricted ROM                  Muscle Tone/Strength: Functionally intact. No obvious neuro-muscular anomalies detected.  Muscle Tone/Strength: Functionally intact. No obvious neuro-muscular anomalies detected.  Sensory (Neurological): Unimpaired        Sensory (Neurological): Unimpaired        DTR: Patellar: deferred today Achilles: deferred today Plantar: deferred today  DTR: Patellar: deferred today Achilles: deferred today Plantar: deferred today  Palpation: No palpable anomalies  Palpation: No palpable anomalies   Assessment & Plan  Primary Diagnosis & Pertinent Problem List: There were no encounter diagnoses.  Visit Diagnosis: No diagnosis found. Problems updated and reviewed during this visit: No problems updated.  Plan of Care  Pharmacotherapy (Medications Ordered): No orders of the defined types were placed in this encounter.  Procedure Orders    No procedure(s) ordered today   Lab Orders  No laboratory test(s) ordered today   Imaging Orders  No imaging studies ordered today   Referral Orders  No referral(s) requested today    Pharmacological management options:  Opioid Analgesics: We'll take over management today. See above orders Membrane stabilizer: We have discussed the possibility of optimizing this mode of therapy, if tolerated Muscle relaxant: We have discussed the possibility of a trial NSAID: We have discussed the possibility of a trial Other analgesic(s): To be determined at a later time   Interventional management options: Planned, scheduled, and/or pending:    ***   Considering:   ***   PRN Procedures:   None at this time   Provider-requested follow-up: No  follow-ups on file.  Future Appointments  Date Time Provider [place]  08/13/2018  8:30 AM Milinda Pointer, MD [facility] None    Primary Care Physician: Tracie Harrier, MD Location: [facility] Note by: Gaspar Cola, MD Date: 08/13/2018; Time: 7:41 PM

## 2018-08-11 ENCOUNTER — Ambulatory Visit: Payer: Medicare HMO | Admitting: Pain Medicine

## 2018-08-13 ENCOUNTER — Ambulatory Visit: Payer: Medicare HMO | Admitting: Pain Medicine

## 2018-09-03 ENCOUNTER — Other Ambulatory Visit: Payer: Self-pay

## 2018-09-03 ENCOUNTER — Ambulatory Visit: Payer: Medicare Other | Attending: Pain Medicine | Admitting: Pain Medicine

## 2018-09-03 DIAGNOSIS — M47816 Spondylosis without myelopathy or radiculopathy, lumbar region: Secondary | ICD-10-CM

## 2018-09-03 DIAGNOSIS — M79641 Pain in right hand: Secondary | ICD-10-CM

## 2018-09-03 DIAGNOSIS — M5442 Lumbago with sciatica, left side: Secondary | ICD-10-CM | POA: Diagnosis not present

## 2018-09-03 DIAGNOSIS — M549 Dorsalgia, unspecified: Secondary | ICD-10-CM

## 2018-09-03 DIAGNOSIS — M79604 Pain in right leg: Secondary | ICD-10-CM

## 2018-09-03 DIAGNOSIS — M79605 Pain in left leg: Secondary | ICD-10-CM

## 2018-09-03 DIAGNOSIS — M79642 Pain in left hand: Secondary | ICD-10-CM

## 2018-09-03 DIAGNOSIS — M5441 Lumbago with sciatica, right side: Secondary | ICD-10-CM

## 2018-09-03 DIAGNOSIS — G8929 Other chronic pain: Secondary | ICD-10-CM

## 2018-09-03 NOTE — Progress Notes (Signed)
Patient's Name: Samantha Macias  MRN: [id number]  Referring Provider: Tracie Harrier, MD  DOB: 1961/04/03  PCP: Tracie Harrier, MD  DOS: 09/03/2018  Note by: Gaspar Cola, MD  Service setting: Virtual Visit (Telephone)  Attending: Gaspar Cola, MD  Location: Telephone Encounter  Specialty: Interventional Pain Management  Patient type: Established   Pain Management Encounter Note - Virtual Visit via Telephone Telehealth (real-time audio visits between healthcare provider and patient).  Patient's Phone No.:  [phone] (home); [phone] (mobile); (Preferred) [phone]  Pre-screening note:  Our staff contacted Samantha Macias and offered her an "in person", "face-to-face" appointment versus a telephone encounter. She indicated preferring the telephone encounter, at this time.   Primary Reason(s) for Virtual Visit: Encounter for evaluation before starting new chronic pain management plan of care (Level of risk: moderate) COVID-19*  Social distancing based on CDC ans AMA recommendations.   I contacted Samantha Macias on 09/03/2018 at 11:42 AM by telephone and clearly identified myself as Gaspar Cola, MD. I verified that I was speaking with the correct person using two identifiers (Name and date of birth: 10/04/1960).  Advanced Informed Consent I sought verbal advanced consent from Samantha Macias for telemedicine interactions and virtual visit. I informed Samantha Macias of the security and privacy concerns, risks, and limitations associated with performing an evaluation and management service by telephone. I also informed Samantha Macias of the availability of "in person" appointments and I informed her of the possibility of a patient responsible charge related to this service. Samantha Macias expressed understanding and agreed to proceed.   Historic Elements   Samantha Macias is a 58 y.o. year old, female patient evaluated today after her last encounter by our  practice on 03/17/2018. Samantha Macias  has a past medical history of Abnormal gait, Allergy, Anxiety, Arthritis, Back pain, Bipolar disorder ([place]), CAD (coronary artery disease), Chronic kidney disease, Collagen vascular disease ([place]), Colon polyp, COPD (chronic obstructive pulmonary disease) ([place]), Falls, Fibromyalgia, GERD (gastroesophageal reflux disease), Hyperlipidemia, Hypertension, IBS (irritable bowel syndrome), Kidney disease, Migraine, Numbness and tingling, Renal insufficiency, Spinal stenosis, Static encephalopathy, Thyroid nodule (07/2017), and TIA (transient ischemic attack). She also  has a past surgical history that includes Tubal ligation; Nasal sinus surgery; Colonoscopy with propofol (N/A, 04/01/2017); Esophagogastroduodenoscopy (egd) with propofol (N/A, 04/01/2017); and Mouth surgery. Samantha Macias has a current medication list which includes the following prescription(s): albuterol, alprazolam, aspirin ec, bupropion, vitamin d, diclofenac sodium, donepezil, formoterol, ibuprofen, ipratropium, losartan-hydrochlorothiazide, mirtazapine, omeprazole, pantoprazole, pregabalin, quetiapine, ramelteon, sertraline, spiriva handihaler, tizanidine, tramadol, and tramadol. She  reports that she has been smoking e-cigarettes. She has a 6.25 pack-year smoking history. She has never used smokeless tobacco. She reports current drug use. Drug: Cocaine. She reports that she does not drink alcohol. Samantha Macias is allergic to shellfish allergy; tetanus toxoid adsorbed; ace inhibitors; codeine; nitrofurantoin monohyd macro; and red dye.   HPI  She is being evaluated for review of studies ordered on initial visit and to consider treatment plan options. Today I went over the results of her tests. These were explained in "Layman's terms". During today's appointment I went over my diagnostic impression, as well as the proposed treatment plan.  According to the patient her primary area of pain is in her lower back (B)  (L>R).  She admits that the left side is greater than the right.  She denies any previous surgery.  She has had interventional therapy by Dr. Primus Bravo in the past.  She admits that  the results vary.  She has completed physical therapy in the past.  She admits that she is currently in aqua therapy at [facility] performing her own exercises that were previously talked in therapy.  She has had recent images.  Her second area of pain is in her legs (B) (L>R).  She admits that the left side is worse than the right.  He has pain that goes down the backs of her legs and turns to the knee.  She denies any numbness tingling in this area.  She admits that she does have some numbness and tingling in her feet.  She denies a previous nerve conduction study.  Her third area of pain is in her upper back.  She admits that this is worse on the right.  She denies any pain radiating.  She denies any previous surgery or interventional therapy.  She is in aqua therapy.  She has not had any recent images.    Her fourth area of pain is in her hands.  She admits that this is related to arthritis.  She admits that she has stiffness.  She denies any previous surgery.  She does receive injections with Dr. Meda Coffee, rheumatologist.  She admits that this is effective.    Her last area of pain is in her left knee.  She did suffer a fall.  She denies any previous surgery.  She did have steroid injection to her knee which was effective.  She is unsure of the recent images.  In considering the treatment plan options, Samantha Macias was reminded that I no longer take patients for medication management only. I asked her to let me know if she had no intention of taking advantage of the interventional therapies, so that we could make arrangements to provide this space to someone interested. I also made it clear that undergoing interventional therapies for the purpose of getting pain medications is very inappropriate on the part of a patient, and it will  not be tolerated in this practice. This type of behavior would suggest true addiction and therefore it requires referral to an addiction specialist.   I discussed the assessment and treatment plan with the patient. The patient was provided an opportunity to ask questions and all were answered. The patient agreed with the plan and demonstrated an understanding of the instructions.  Patient advised to call back or seek an in-person evaluation if the symptoms or condition worsens.  Controlled Substance Pharmacotherapy Assessment REMS (Risk Evaluation and Mitigation Strategy)  Analgesic: Tramadol 50 mg 1 tablet 4 times daily (fill date in 07/30/2018) tramadol 200 mg/day Highest recorded MME/day: 30 mg/day MME/day: 20 mg/day   Monitoring: [place] PMP: PDMP not reviewed this encounter.       Not applicable at this point since we have not taken over the patient's medication management yet. List of other Serum/Urine Drug Screening Test(s):  Lab Results  Component Value Date   COCAINSCRNUR POSITIVE (A) 08/07/2017   COCAINSCRNUR POSITIVE (A) 07/17/2017   THCU NONE DETECTED 08/07/2017   THCU NONE DETECTED 07/17/2017   List of all UDS test(s) done:  Lab Results  Component Value Date   SUMMARY FINAL 03/17/2018   Last UDS on record: Summary  Date Value Ref Range Status  03/17/2018 FINAL  Final    Comment:    ==================================================================== TOXASSURE COMP DRUG ANALYSIS,UR ==================================================================== Test  Result       Flag       Units Drug Present and Declared for Prescription Verification   Tramadol                       1825         EXPECTED   ng/mg creat   O-Desmethyltramadol            929          EXPECTED   ng/mg creat   N-Desmethyltramadol            1352         EXPECTED   ng/mg creat    Source of tramadol is a prescription medication.    O-desmethyltramadol and N-desmethyltramadol  are expected    metabolites of tramadol.   Pregabalin                     PRESENT      EXPECTED   Tizanidine                     PRESENT      EXPECTED   Bupropion                      PRESENT      EXPECTED   Hydroxybupropion               PRESENT      EXPECTED    Hydroxybupropion is an expected metabolite of bupropion.   Mirtazapine                    PRESENT      EXPECTED   Quetiapine                     PRESENT      EXPECTED   Salicylate                     PRESENT      EXPECTED Drug Present not Declared for Prescription Verification   Benzoylecgonine                494          UNEXPECTED ng/mg creat    Benzoylecgonine is a metabolite of cocaine; its presence    indicates use of this drug.  Source is most commonly illicit, but    cocaine is present in some topical anesthetic solutions.   Acetaminophen                  PRESENT      UNEXPECTED   Diphenhydramine                PRESENT      UNEXPECTED Drug Absent but Declared for Prescription Verification   Alprazolam                     Not Detected UNEXPECTED ng/mg creat   Sertraline                     Not Detected UNEXPECTED   Diclofenac                     Not Detected UNEXPECTED    Topical diclofenac, as indicated in the declared medication list,    is not always detected even when used as directed.   Ibuprofen  Not Detected UNEXPECTED    Ibuprofen, as indicated in the declared medication list, is not    always detected even when used as directed. ==================================================================== Test                      Result    Flag   Units      Ref Range   Creatinine              159              mg/dL      >=20 ==================================================================== Declared Medications:  The flagging and interpretation on this report are based on the  following declared medications.  Unexpected results may arise from  inaccuracies in the declared medications.  **Note:  The testing scope of this panel includes these medications:  Alprazolam (Xanax)  Bupropion (Wellbutrin)  Mirtazapine (Remeron)  Pregabalin (Lyrica)  Quetiapine (Seroquel)  Sertraline (Zoloft)  Tramadol (Ultram)  **Note: The testing scope of this panel does not include small to  moderate amounts of these reported medications:  Aspirin (Aspirin 81)  Ibuprofen  Tizanidine (Zanaflex)  Topical Diclofenac  **Note: The testing scope of this panel does not include following  reported medications:  Albuterol  Donepezil (Aricept)  Formoterol (Perforomist)  Hydrochlorothiazide (Hyzaar)  Ipratropium  Losartan (Hyzaar)  Omeprazole (Prilosec)  Pantoprazole (Protonix)  Ramelteon (Rozerem)  Tiotropium (Spiriva)  Vitamin D ==================================================================== For clinical consultation, please call [phone]. ====================================================================    UDS interpretation: Unexpected findings: Undeclared illicit substance detected Medication Assessment Form: Not applicable. No opioids. Treatment compliance: Non-compliant Risk Assessment Profile: Aberrant behavior: See initial evaluations. None observed or detected today Comorbid factors increasing risk of overdose: See initial evaluation. No additional risks detected today Opioid risk tool (ORT):  Opioid Risk  03/17/2018  Alcohol 1  Illegal Drugs 2  Rx Drugs 0  Alcohol 0  Illegal Drugs 0 (Inaccurate)  Rx Drugs 0  Age between 16-45 years  0  History of Preadolescent Sexual Abuse 0  Psychological Disease 2  Depression 0  Opioid Risk Tool Scoring 5  Opioid Risk Interpretation Moderate Risk    ORT Scoring interpretation table:  Score <3 = Low Risk for SUD  Score between 4-7 = Moderate Risk for SUD  Score >8 = High Risk for Opioid Abuse   Risk of substance use disorder (SUD): Very High  Risk Mitigation Strategies:  Patient opioid safety counseling: No controlled  substances prescribed. Patient-Prescriber Agreement (PPA): No agreement signed.  Controlled substance notification to other providers: None required. No opioid therapy.  Pharmacologic Plan: Non-opioid analgesic therapy offered.             Meds   Current Outpatient Medications:  .  albuterol (PROVENTIL HFA;VENTOLIN HFA) 108 (90 Base) MCG/ACT inhaler, Inhale 2 puffs into the lungs every 6 (six) hours as needed for wheezing or shortness of breath., Disp: , Rfl:  .  ALPRAZolam (XANAX) 0.5 MG tablet, Take 0.5-0.75 mg by mouth at bedtime as needed for anxiety., Disp: , Rfl:  .  aspirin EC 81 MG tablet, Take 1 tablet (81 mg total) by mouth at bedtime., Disp: , Rfl:  .  buPROPion (WELLBUTRIN XL) 300 MG 24 hr tablet, Take 300 mg by mouth daily., Disp: , Rfl: 1 .  Cholecalciferol (VITAMIN D) 2000 units tablet, Take 2,000 Units by mouth daily., Disp: , Rfl:  .  diclofenac sodium (VOLTAREN) 1 % GEL, Apply 4 g topically 4 (four) times daily as needed. (Patient taking  differently: Apply 4 g topically 4 (four) times daily as needed. For pain), Disp: 100 g, Rfl: 5 .  donepezil (ARICEPT) 10 MG tablet, Take 10 mg by mouth at bedtime., Disp: , Rfl:  .  formoterol (PERFOROMIST) 20 MCG/2ML nebulizer solution, Take 20 mcg by nebulization 2 (two) times daily., Disp: , Rfl:  .  ibuprofen (ADVIL,MOTRIN) 600 MG tablet, Take 1 tablet (600 mg total) by mouth every 6 (six) hours as needed., Disp: 30 tablet, Rfl: 0 .  ipratropium (ATROVENT) 0.02 % nebulizer solution, Take 0.5 mg by nebulization 3 (three) times daily., Disp: , Rfl:  .  losartan-hydrochlorothiazide (HYZAAR) 100-12.5 MG tablet, Take 1 tablet by mouth daily., Disp: 90 tablet, Rfl: 1 .  mirtazapine (REMERON) 15 MG tablet, Take 15 mg by mouth at bedtime., Disp: , Rfl: 5 .  omeprazole (PRILOSEC) 20 MG capsule, Take 20 mg by mouth daily., Disp: , Rfl:  .  pantoprazole (PROTONIX) 40 MG tablet, Take 40 mg by mouth daily., Disp: , Rfl: 1 .  pregabalin (LYRICA) 200  MG capsule, Take 1 capsule (200 mg total) by mouth 3 (three) times daily., Disp: 30 capsule, Rfl: 0 .  QUEtiapine (SEROQUEL) 100 MG tablet, Take 150 mg by mouth at bedtime., Disp: , Rfl: 0 .  ramelteon (ROZEREM) 8 MG tablet, Take 1 tablet (8 mg total) by mouth at bedtime., Disp: 90 tablet, Rfl: 1 .  sertraline (ZOLOFT) 50 MG tablet, Take 50 mg by mouth daily., Disp: , Rfl: 5 .  SPIRIVA HANDIHALER 18 MCG inhalation capsule, Place 1 puff into inhaler and inhale daily., Disp: , Rfl: 0 .  tiZANidine (ZANAFLEX) 2 MG tablet, Take 1 tablet (2 mg total) by mouth every 6 (six) hours as needed for muscle spasms. (Patient taking differently: Take 4 mg by mouth every 6 (six) hours as needed for muscle spasms. ), Disp: 15 tablet, Rfl: 5 .  traMADol (ULTRAM) 50 MG tablet, Take 1 tablet (50 mg total) by mouth every 8 (eight) hours as needed. (Patient taking differently: Take 50 mg by mouth every 6 (six) hours as needed for moderate pain. ), Disp: 90 tablet, Rfl: 0 .  traMADol (ULTRAM) 50 MG tablet, Take 1 tablet (50 mg total) by mouth every 6 (six) hours as needed., Disp: 20 tablet, Rfl: 0  Laboratory Chemistry  Inflammation Markers (CRP: Acute Phase) (ESR: Chronic Phase) Lab Results  Component Value Date   CRP 17 (H) 03/17/2018   ESRSEDRATE 28 03/17/2018                         Rheumatology Markers No results found.  Renal Function Markers Lab Results  Component Value Date   BUN 22 03/17/2018   CREATININE 1.35 (H) 03/17/2018   BCR 16 03/17/2018   GFRAA 51 (L) 03/17/2018   GFRNONAA 44 (L) 03/17/2018                             Hepatic Function Markers Lab Results  Component Value Date   AST 13 03/17/2018   ALT 15 12/24/2017   ALBUMIN 4.4 03/17/2018   ALKPHOS 148 (H) 03/17/2018   LIPASE 37 12/24/2017   AMMONIA 23 07/07/2015                        Electrolytes Lab Results  Component Value Date   NA 141 03/17/2018   K 4.3 03/17/2018   CL  99 03/17/2018   CALCIUM 9.1 03/17/2018   MG 1.8  03/17/2018                        Neuropathy Markers Lab Results  Component Value Date   VITAMINB12 268 03/17/2018   HGBA1C 5.6 07/06/2015                        CNS Tests No results found.  Bone Pathology Markers Lab Results  Component Value Date   25OHVITD1 41 03/17/2018   25OHVITD2 <1.0 03/17/2018   25OHVITD3 41 03/17/2018                         Coagulation Parameters Lab Results  Component Value Date   INR 0.91 07/10/2017   LABPROT 12.2 07/10/2017   APTT 27 07/10/2017   PLT 224 12/24/2017                        Cardiovascular Markers Lab Results  Component Value Date   BNP 135.0 (H) 11/08/2016   CKTOTAL 47 02/10/2014   CKMB 0.6 02/10/2014   TROPONINI <0.03 11/08/2016   HGB 13.4 12/24/2017   HCT 38.5 12/24/2017                         ID Markers No results found.  CA Markers No results found.  Endocrine Markers Lab Results  Component Value Date   TSH 0.49 02/23/2014                        Note: Lab results reviewed.  Recent Diagnostic Imaging Review  Cervical Imaging: Cervical MR wo contrast:  Results for orders placed during the hospital encounter of 05/17/17  MR CERVICAL SPINE WO CONTRAST   Narrative CLINICAL DATA:  58 year old female with chronic cervical neck pain. Recent pain radiation down the left arm with numbness from the elbow distally. Chronic lumbar back pain progressive for 1 month. Frequent falls. Painful sitting and standing straight.  EXAM: MRI CERVICAL AND LUMBAR SPINE WITHOUT CONTRAST  TECHNIQUE: Multiplanar and multiecho pulse sequences of the cervical spine, to include the craniocervical junction and cervicothoracic junction, and lumbar spine, were obtained without intravenous contrast.  COMPARISON:  Brain MRI 07/27/2015. Cervical spine MRI 10/11/2009. Lumbar MRI 03/15/2016. CT Abdomen and Pelvis 12/22/2013.  FINDINGS: MRI CERVICAL SPINE FINDINGS  Alignment: Chronic straightening of cervical lordosis.  Mild anterolisthesis of C3 on C4 appears stable but similar mild degenerative appearing anterolisthesis of C4 on C5 has developed since 2011. Subtle anterolisthesis of C5 on C6.  Vertebrae: There is mild degenerative appearing marrow edema in the C5 lamina posteriorly in the midline (series 5, image 7). This is new compared to 2011. Background bone marrow signal is normal. Chronic but increased degenerative endplate irregularity inferiorly at C6. No other marrow edema or acute osseous abnormality.  Cord: Spinal cord signal is within normal limits at all visualized levels.  Posterior Fossa, vertebral arteries, paraspinal tissues: Negative visualized posterior fossa. Cervicomedullary junction is within normal limits. Preserved major vascular flow voids in the neck. Negative neck soft tissues.  Disc levels:  C2-C3: Moderate to severe facet hypertrophy greater on the right has developed since 2011. Right foraminal disc bulge and endplate spurring. No spinal stenosis, but moderate right C3 neural foraminal stenosis is new from the prior MRI.  C3-C4: Moderate to  severe facet hypertrophy greater on the right has progressed since 2011. Mild foraminal disc bulging and endplate spurring greater on the right. Mild left and moderate to severe right C4 foraminal stenosis have increased since 2011.  C4-C5: Chronic left side facet hypertrophy, but moderate to severe facet degeneration has progressed since 2011. Left greater than right foraminal disc bulging and endplate spurring. Mild left ligament flavum hypertrophy. No spinal stenosis. Severe left and mild to moderate right C5 foraminal stenosis have progressed.  C5-C6: Mild facet hypertrophy has increased on the right. Mild circumferential disc bulge with foraminal disc and endplate spurring. No spinal stenosis. Increased borderline to mild left and moderate right C6 foraminal stenosis.  C6-C7: Disc space loss since 2011.  Circumferential disc bulge and endplate spurring with broad-based posterior and biforaminal component. Mild ligament flavum hypertrophy. Mild spinal stenosis. Borderline to mild spinal cord mass effect. Moderate to severe bilateral C7 neural foraminal stenosis. This level has progressed since 2011.  C7-T1: Progressed moderate facet hypertrophy greater on the left. Mild foraminal endplate spurring. Increased mild left C8 foraminal stenosis.  No upper thoracic spinal or foraminal stenosis.  MRI LUMBAR SPINE FINDINGS  Segmentation: Normal as demonstrated on the 2015 CT, which is the same numbering system used on the 2017 MRI.  Alignment: Stable vertebral height and alignment since 2017. Mild retrolisthesis at L5-S1. Relatively preserved lumbar lordosis; mild reversal at L1-L2.  Vertebrae: Chronic but perhaps mildly increased degenerative appearing endplate marrow edema posteriorly and to the left at L5-S1.  New degenerative appearing marrow edema throughout the chronically degenerated right L4 and L5 facets (with some pedicle involvement). See series 12, images 3 and 4).  Background bone marrow signal remains normal. There is trace degenerative endplate edema anteriorly superiorly at L3. No other acute osseous abnormality. Intact visible sacrum and SI joints.  Conus medullaris and cauda equina: Conus extends to the T12-L1 level. Conus and cauda equina appear normal. Fairly capacious spinal canal.  Paraspinal and other soft tissues: Negative.  Disc levels:  T12-L1:  Negative.  L1-L2: Chronic disc desiccation and disc space loss. Stable mild right paracentral disc protrusion. No stenosis.  L2-L3: Mild chronic disc bulge, most affecting the left neural foramen. Small left foraminal annular fissure of the disc is increased in conspicuity on series 10, image 12. Stable mild facet hypertrophy. Mild left L2 neural foraminal stenosis is stable.  L3-L4: Chronic disc  desiccation and mostly far lateral disc bulging. Mild facet hypertrophy. No stenosis.  L4-L5: Chronic circumferential disc bulge. Broad-based bilateral foraminal involvement with endplate spurring far laterally on the right. Possible chronic left foraminal annular fissure. Moderate to severe facet hypertrophy. Increased bilateral facet joint fluid, greater on the right. Borderline to mild spinal stenosis and right lateral recess stenosis (descending right L5 nerve level). Mild bilateral L4 foraminal stenosis. This level has progressed since 2017.  L5-S1: Chronic circumferential disc bulge and endplate spurring eccentric to the left and most affecting the left neural foramen. Mild to moderate facet hypertrophy is stable. Increased mild epidural lipomatosis. No spinal or lateral recess stenosis. Moderate left L5 foraminal stenosis is stable.  IMPRESSION: CERVICAL SPINE:  1. Progressed cervical spine degeneration since 2011. Multilevel mild spondylolisthesis associated with increased moderate to severe facet hypertrophy at multiple levels. Associated degenerative appearing mild marrow edema in the right C5 lamina. Subsequent new or increased neural foraminal stenosis which is moderate or severe at the right C3, right C4, left C5, right C6 and bilateral C7 nerve levels. 2. Multifactorial mild cervical spinal  stenosis at C6-C7 with up to mild spinal cord mass effect, and the bilateral C7 foraminal stenosis. No spinal cord signal abnormality.  LUMBAR SPINE:  1. Chronic facet degeneration at L4 and L5 has progressed since the 2017 MRI and is severe, with associated new degenerative marrow edema. Increased multifactorial mild spinal, right lateral recess, and bilateral neural foraminal stenosis at that level. 2. Other lumbar levels appear stable since 2017, including chronic disc and endplate degeneration eccentric to the left at L5-S1.   Electronically Signed   By: Genevie Ann M.D.    On: 05/17/2017 12:17    Lumbosacral Imaging: Lumbar MR wo contrast:  Results for orders placed during the hospital encounter of 05/17/17  MR LUMBAR SPINE WO CONTRAST   Narrative CLINICAL DATA:  58 year old female with chronic cervical neck pain. Recent pain radiation down the left arm with numbness from the elbow distally. Chronic lumbar back pain progressive for 1 month. Frequent falls. Painful sitting and standing straight.  EXAM: MRI CERVICAL AND LUMBAR SPINE WITHOUT CONTRAST  TECHNIQUE: Multiplanar and multiecho pulse sequences of the cervical spine, to include the craniocervical junction and cervicothoracic junction, and lumbar spine, were obtained without intravenous contrast.  COMPARISON:  Brain MRI 07/27/2015. Cervical spine MRI 10/11/2009. Lumbar MRI 03/15/2016. CT Abdomen and Pelvis 12/22/2013.  FINDINGS: MRI CERVICAL SPINE FINDINGS  Alignment: Chronic straightening of cervical lordosis. Mild anterolisthesis of C3 on C4 appears stable but similar mild degenerative appearing anterolisthesis of C4 on C5 has developed since 2011. Subtle anterolisthesis of C5 on C6.  Vertebrae: There is mild degenerative appearing marrow edema in the C5 lamina posteriorly in the midline (series 5, image 7). This is new compared to 2011. Background bone marrow signal is normal. Chronic but increased degenerative endplate irregularity inferiorly at C6. No other marrow edema or acute osseous abnormality.  Cord: Spinal cord signal is within normal limits at all visualized levels.  Posterior Fossa, vertebral arteries, paraspinal tissues: Negative visualized posterior fossa. Cervicomedullary junction is within normal limits. Preserved major vascular flow voids in the neck. Negative neck soft tissues.  Disc levels:  C2-C3: Moderate to severe facet hypertrophy greater on the right has developed since 2011. Right foraminal disc bulge and endplate spurring. No spinal stenosis, but  moderate right C3 neural foraminal stenosis is new from the prior MRI.  C3-C4: Moderate to severe facet hypertrophy greater on the right has progressed since 2011. Mild foraminal disc bulging and endplate spurring greater on the right. Mild left and moderate to severe right C4 foraminal stenosis have increased since 2011.  C4-C5: Chronic left side facet hypertrophy, but moderate to severe facet degeneration has progressed since 2011. Left greater than right foraminal disc bulging and endplate spurring. Mild left ligament flavum hypertrophy. No spinal stenosis. Severe left and mild to moderate right C5 foraminal stenosis have progressed.  C5-C6: Mild facet hypertrophy has increased on the right. Mild circumferential disc bulge with foraminal disc and endplate spurring. No spinal stenosis. Increased borderline to mild left and moderate right C6 foraminal stenosis.  C6-C7: Disc space loss since 2011. Circumferential disc bulge and endplate spurring with broad-based posterior and biforaminal component. Mild ligament flavum hypertrophy. Mild spinal stenosis. Borderline to mild spinal cord mass effect. Moderate to severe bilateral C7 neural foraminal stenosis. This level has progressed since 2011.  C7-T1: Progressed moderate facet hypertrophy greater on the left. Mild foraminal endplate spurring. Increased mild left C8 foraminal stenosis.  No upper thoracic spinal or foraminal stenosis.  MRI LUMBAR SPINE FINDINGS  Segmentation:  Normal as demonstrated on the 2015 CT, which is the same numbering system used on the 2017 MRI.  Alignment: Stable vertebral height and alignment since 2017. Mild retrolisthesis at L5-S1. Relatively preserved lumbar lordosis; mild reversal at L1-L2.  Vertebrae: Chronic but perhaps mildly increased degenerative appearing endplate marrow edema posteriorly and to the left at L5-S1.  New degenerative appearing marrow edema throughout the  chronically degenerated right L4 and L5 facets (with some pedicle involvement). See series 12, images 3 and 4).  Background bone marrow signal remains normal. There is trace degenerative endplate edema anteriorly superiorly at L3. No other acute osseous abnormality. Intact visible sacrum and SI joints.  Conus medullaris and cauda equina: Conus extends to the T12-L1 level. Conus and cauda equina appear normal. Fairly capacious spinal canal.  Paraspinal and other soft tissues: Negative.  Disc levels:  T12-L1:  Negative.  L1-L2: Chronic disc desiccation and disc space loss. Stable mild right paracentral disc protrusion. No stenosis.  L2-L3: Mild chronic disc bulge, most affecting the left neural foramen. Small left foraminal annular fissure of the disc is increased in conspicuity on series 10, image 12. Stable mild facet hypertrophy. Mild left L2 neural foraminal stenosis is stable.  L3-L4: Chronic disc desiccation and mostly far lateral disc bulging. Mild facet hypertrophy. No stenosis.  L4-L5: Chronic circumferential disc bulge. Broad-based bilateral foraminal involvement with endplate spurring far laterally on the right. Possible chronic left foraminal annular fissure. Moderate to severe facet hypertrophy. Increased bilateral facet joint fluid, greater on the right. Borderline to mild spinal stenosis and right lateral recess stenosis (descending right L5 nerve level). Mild bilateral L4 foraminal stenosis. This level has progressed since 2017.  L5-S1: Chronic circumferential disc bulge and endplate spurring eccentric to the left and most affecting the left neural foramen. Mild to moderate facet hypertrophy is stable. Increased mild epidural lipomatosis. No spinal or lateral recess stenosis. Moderate left L5 foraminal stenosis is stable.  IMPRESSION: CERVICAL SPINE:  1. Progressed cervical spine degeneration since 2011. Multilevel mild spondylolisthesis associated with  increased moderate to severe facet hypertrophy at multiple levels. Associated degenerative appearing mild marrow edema in the right C5 lamina. Subsequent new or increased neural foraminal stenosis which is moderate or severe at the right C3, right C4, left C5, right C6 and bilateral C7 nerve levels. 2. Multifactorial mild cervical spinal stenosis at C6-C7 with up to mild spinal cord mass effect, and the bilateral C7 foraminal stenosis. No spinal cord signal abnormality.  LUMBAR SPINE:  1. Chronic facet degeneration at L4 and L5 has progressed since the 2017 MRI and is severe, with associated new degenerative marrow edema. Increased multifactorial mild spinal, right lateral recess, and bilateral neural foraminal stenosis at that level. 2. Other lumbar levels appear stable since 2017, including chronic disc and endplate degeneration eccentric to the left at L5-S1.   Electronically Signed   By: Genevie Ann M.D.   On: 05/17/2017 12:17    Hip Imaging: Hip-L DG 2-3 views:  Results for orders placed during the hospital encounter of 11/28/15  DG HIP UNILAT WITH PELVIS 2-3 VIEWS LEFT   Narrative CLINICAL DATA:  Fall last night.  Left hip pain.  EXAM: DG HIP (WITH OR WITHOUT PELVIS) 2-3V LEFT  COMPARISON:  None.  FINDINGS: No acute bony abnormality. Specifically, no fracture, subluxation, or dislocation. Soft tissues are intact. SI joints and hip joints are symmetric and unremarkable.  IMPRESSION: Negative.   Electronically Signed   By: Rolm Baptise M.D.   On: 11/28/2015  11:38    Knee Imaging: Knee-L DG 4 views:  Results for orders placed during the hospital encounter of 11/28/15  DG Knee Complete 4 Views Left   Narrative CLINICAL DATA:  Fall yesterday with persistent left knee pain, initial encounter  EXAM: LEFT KNEE - COMPLETE 4+ VIEW  COMPARISON:  None.  FINDINGS: No evidence of fracture, dislocation, or joint effusion. No evidence of arthropathy or other focal  bone abnormality. Soft tissues are unremarkable.  IMPRESSION: No acute abnormality noted.   Electronically Signed   By: Inez Catalina M.D.   On: 11/28/2015 11:28    Foot Imaging: Foot-R DG Complete:  Results for orders placed during the hospital encounter of 07/30/18  DG Foot Complete Right   Narrative CLINICAL DATA:  Right foot pain after injury last night.  EXAM: RIGHT FOOT COMPLETE - 3+ VIEW  COMPARISON:  Radiographs of December 10, 2016.  FINDINGS: There is no evidence of fracture or dislocation. Stable degenerative changes seen involving first metatarsophalangeal joint. Mild posterior calcaneal spurring is noted. Soft tissues are unremarkable.  IMPRESSION: No acute abnormality seen in the right foot.   Electronically Signed   By: Marijo Conception, M.D.   On: 07/30/2018 11:01    Complexity Note: Imaging results reviewed. Results shared with Samantha Macias, using Layman's terms.                         Assessment  The primary encounter diagnosis was Chronic low back pain (Primary Area of Pain) (Bilateral) (L>R) w/ sciatica. Diagnoses of Chronic lower extremity pain (Secondary Area of Pain) (Bilateral) (L>R), Chronic upper back pain (Tertiary Area of Pain) (Bilateral) (R>L), Chronic hand pain (Fourth Area of Pain) (Bilateral) (R>L), Lumbar facet syndrome (Bilateral), and Lumbar facet hypertrophy (Bilateral) were also pertinent to this visit.   Based on my conversation with the patient today and her recent MRI, it is clear that she is having problems with her facet joints and therefore we will bring her back for a diagnostic bilateral lumbar facet block under fluoroscopic guidance and IV sedation.  Will not be prescribing any medications due to the fact that her UDS was positive for cocaine.  Plan of Care  I am having Samantha Macias. Samantha Macias maintain her Vitamin D, ibuprofen, losartan-hydrochlorothiazide, aspirin EC, traMADol, diclofenac sodium, ramelteon, ALPRAZolam, pregabalin,  tiZANidine, donepezil, omeprazole, Spiriva HandiHaler, buPROPion, mirtazapine, sertraline, QUEtiapine, pantoprazole, albuterol, formoterol, ipratropium, and traMADol.   Pharmacotherapy (Medications Ordered): No orders of the defined types were placed in this encounter.   Procedure Orders     LUMBAR FACET(MEDIAL BRANCH NERVE BLOCK) MBNB Lab Orders  No laboratory test(s) ordered today   Imaging Orders  No imaging studies ordered today   Referral Orders  No referral(s) requested today   Orders:  Orders Placed This Encounter  Procedures  . LUMBAR FACET(MEDIAL BRANCH NERVE BLOCK) MBNB    Standing Status:   Future    Standing Expiration Date:   10/03/2018    Scheduling Instructions:     Side: Bilateral     Level: L3-4, L4-5, & L5-S1 Facets (L2, L3, L4, L5, & S1 Medial Branch Nerves)     Sedation: With Sedation.     Timeframe: ASAA    Order Specific Question:   Where will this procedure be performed?    Answer:   [facility]   Pharmacological management options:  Opioid Analgesics: I will not be prescribing any opioids at this time Membrane stabilizer: We have discussed  the possibility of optimizing this mode of therapy, if tolerated Muscle relaxant: We have discussed the possibility of a trial NSAID: We have discussed the possibility of a trial Other analgesic(s): To be determined at a later time   Interventional management options: Planned, scheduled, and/or pending:    Diagnostic bilateral lumbar facet block #1 under fluoroscopic guidance and IV sedation as soon as the COVID-19 restrictions go down.   Considering:   Diagnostic midline LESI  Diagnostic bilateral lumbar facet nerve block  Possible bilateral lumbar facet RFA  Diagnostic bilateral thoracic facet nerve block  Possible bilateral thoracic RFA  Diagnostic left intra-articular knee injection    PRN Procedures:   None at this time   Total duration of non-face-to-face encounter: 25 minutes.  Follow-up  plan:   Return for Procedure (w/ sedation): (B) L-FCT BLK #1.    No future appointments.  Primary Care Physician: Tracie Harrier, MD Location: Telephone Virtual Visit Note by: Gaspar Cola, MD Date: 09/03/2018; Time: 11:42 AM  Disclaimer:  * Given the special circumstances of the COVID-19 pandemic, the federal government has announced that the Office for Civil Rights (OCR) will exercise its enforcement discretion and will not impose penalties on physicians using telehealth in the event of noncompliance with regulatory requirements under the [place] and Accountability Act (HIPAA) in connection with the good faith provision of telehealth during the ZMCEY-22 national public health emergency. ([place])

## 2018-09-03 NOTE — Patient Instructions (Signed)

## 2018-09-10 DIAGNOSIS — M7989 Other specified soft tissue disorders: Secondary | ICD-10-CM | POA: Insufficient documentation

## 2018-10-30 ENCOUNTER — Other Ambulatory Visit: Payer: Self-pay | Admitting: Pain Medicine

## 2018-10-30 DIAGNOSIS — Z01818 Encounter for other preprocedural examination: Secondary | ICD-10-CM

## 2018-11-10 ENCOUNTER — Other Ambulatory Visit: Payer: Self-pay

## 2018-11-10 ENCOUNTER — Other Ambulatory Visit
Admission: RE | Admit: 2018-11-10 | Discharge: 2018-11-10 | Disposition: A | Discharge status: Home / Self Care | Payer: Medicare Other | Source: Ambulatory Visit | Attending: Pain Medicine | Admitting: Pain Medicine

## 2018-11-10 DIAGNOSIS — Z01818 Encounter for other preprocedural examination: Secondary | ICD-10-CM

## 2018-11-10 DIAGNOSIS — Z1159 Encounter for screening for other viral diseases: Secondary | ICD-10-CM | POA: Diagnosis present

## 2018-11-11 LAB — NOVEL CORONAVIRUS, NAA (HOSP ORDER, SEND-OUT TO REF LAB; TAT 18-24 HRS): SARS-CoV-2, NAA: NOT DETECTED

## 2018-11-12 DIAGNOSIS — M488X7 Other specified spondylopathies, lumbosacral region: Secondary | ICD-10-CM | POA: Insufficient documentation

## 2018-11-12 NOTE — Progress Notes (Signed)
Patient's Name: Samantha Macias  MRN: [id number]  Referring Provider: Tracie Harrier, MD  DOB: [birth year]  PCP: Tracie Harrier, MD  DOS: 11/13/2018  Note by: Gaspar Cola, MD  Service setting: Ambulatory outpatient  Specialty: Interventional Pain Management  Patient type: Established  Location: [facility] (AMB) Pain Management Facility  Visit type: Interventional Procedure   Primary Reason for Visit: Interventional Pain Management Treatment. CC: Back Pain (bilateral) and Knee Pain (left)  Procedure:          Anesthesia, Analgesia, Anxiolysis:  Type: Lumbar Facet, Medial Branch Block(s)          Primary Purpose: Diagnostic Region: Posterolateral Lumbosacral Spine Level: L2, L3, L4, L5, & S1 Medial Branch Level(s). Injecting these levels blocks the L3-4, L4-5, and L5-S1 lumbar facet joints. Laterality: Bilateral  Type: Moderate (Conscious) Sedation combined with Local Anesthesia Indication(s): Analgesia and Anxiety Route: Intravenous (IV) IV Access: Secured Sedation: Meaningful verbal contact was maintained at all times during the procedure  Local Anesthetic: Lidocaine 1-2%  Position: Prone   Indications: 1. Lumbar facet syndrome (Bilateral)   2. Lumbar facet hypertrophy (Bilateral)   3. Other specified spondylopathies, lumbosacral region ([place])   4. Chronic low back pain (Primary Area of Pain) (Bilateral) (L>R) w/ sciatica    Pain Score: Pre-procedure: 8 /10 Post-procedure: 3 /10  Pre-op Assessment:  Samantha Macias is a 58 y.o. (year old), female patient, seen today for interventional treatment. She  has a past surgical history that includes Tubal ligation; Nasal sinus surgery; Colonoscopy with propofol (N/A, 04/01/2017); Esophagogastroduodenoscopy (egd) with propofol (N/A, 04/01/2017); and Mouth surgery. Samantha Macias has a current medication list which includes the following prescription(s): albuterol, aspirin, bupropion, diclofenac sodium, tylenol pm extra strength, donepezil,  doxepin, formoterol, ibuprofen, ipratropium, losartan-hydrochlorothiazide, melatonin, meloxicam, mirtazapine, pantoprazole, pregabalin, quetiapine, ramelteon, sertraline, spiriva handihaler, tizanidine, anoro ellipta, alprazolam, aspirin ec, vitamin d, doxepin, omeprazole, tramadol, and tramadol, and the following Facility-Administered Medications: fentanyl, lactated ringers, and midazolam. Her primarily concern today is the Back Pain (bilateral) and Knee Pain (left)  Initial Vital Signs:  Pulse/HCG Rate: 80ECG Heart Rate: 77 Temp: 97.8 F (36.6 C) Resp: 16 BP: (!) 150/89 SpO2: 97 %  BMI: Estimated body mass index is 34.46 kg/m as calculated from the following:   Height as of this encounter: 5\' 7"  (1.702 m).   Weight as of this encounter: 220 lb (99.8 kg).  Risk Assessment: Allergies: Reviewed. She is allergic to shellfish allergy; tetanus toxoid adsorbed; ace inhibitors; codeine; nitrofurantoin monohyd macro; and red dye.  Allergy Precautions: None required Coagulopathies: Reviewed. None identified.  Blood-thinner therapy: None at this time Active Infection(s): Reviewed. None identified. Samantha Macias is afebrile  Site Confirmation: Samantha Macias was asked to confirm the procedure and laterality before marking the site Procedure checklist: Completed Consent: Before the procedure and under the influence of no sedative(s), amnesic(s), or anxiolytics, the patient was informed of the treatment options, risks and possible complications. To fulfill our ethical and legal obligations, as recommended by the American Medical Association's Code of Ethics, I have informed the patient of my clinical impression; the nature and purpose of the treatment or procedure; the risks, benefits, and possible complications of the intervention; the alternatives, including doing nothing; the risk(s) and benefit(s) of the alternative treatment(s) or procedure(s); and the risk(s) and benefit(s) of doing nothing. The patient  was provided information about the general risks and possible complications associated with the procedure. These may include, but are not limited to: failure to achieve desired goals, infection, bleeding,  organ or nerve damage, allergic reactions, paralysis, and death. In addition, the patient was informed of those risks and complications associated to Spine-related procedures, such as failure to decrease pain; infection (i.e.: Meningitis, epidural or intraspinal abscess); bleeding (i.e.: epidural hematoma, subarachnoid hemorrhage, or any other type of intraspinal or peri-dural bleeding); organ or nerve damage (i.e.: Any type of peripheral nerve, nerve root, or spinal cord injury) with subsequent damage to sensory, motor, and/or autonomic systems, resulting in permanent pain, numbness, and/or weakness of one or several areas of the body; allergic reactions; (i.e.: anaphylactic reaction); and/or death. Furthermore, the patient was informed of those risks and complications associated with the medications. These include, but are not limited to: allergic reactions (i.e.: anaphylactic or anaphylactoid reaction(s)); adrenal axis suppression; blood sugar elevation that in diabetics may result in ketoacidosis or comma; water retention that in patients with history of congestive heart failure may result in shortness of breath, pulmonary edema, and decompensation with resultant heart failure; weight gain; swelling or edema; medication-induced neural toxicity; particulate matter embolism and blood vessel occlusion with resultant organ, and/or nervous system infarction; and/or aseptic necrosis of one or more joints. Finally, the patient was informed that Medicine is not an exact science; therefore, there is also the possibility of unforeseen or unpredictable risks and/or possible complications that may result in a catastrophic outcome. The patient indicated having understood very clearly. We have given the patient no  guarantees and we have made no promises. Enough time was given to the patient to ask questions, all of which were answered to the patient's satisfaction. Samantha Macias has indicated that she wanted to continue with the procedure. Attestation: I, the ordering provider, attest that I have discussed with the patient the benefits, risks, side-effects, alternatives, likelihood of achieving goals, and potential problems during recovery for the procedure that I have provided informed consent. Date   Time: 11/13/2018  9:24 AM  Pre-Procedure Preparation:  Monitoring: As per clinic protocol. Respiration, ETCO2, SpO2, BP, heart rate and rhythm monitor placed and checked for adequate function Safety Precautions: Patient was assessed for positional comfort and pressure points before starting the procedure. Time-out: I initiated and conducted the "Time-out" before starting the procedure, as per protocol. The patient was asked to participate by confirming the accuracy of the "Time Out" information. Verification of the correct person, site, and procedure were performed and confirmed by me, the nursing staff, and the patient. "Time-out" conducted as per Joint Commission's Universal Protocol (UP.01.01.01). Time: 1016  Description of Procedure:          Laterality: Bilateral. The procedure was performed in identical fashion on both sides. Levels:  L2, L3, L4, L5, & S1 Medial Branch Level(s) Area Prepped: Posterior Lumbosacral Region Prepping solution: DuraPrep (Iodine Povacrylex [0.7% available iodine] and Isopropyl Alcohol, 74% w/w) Safety Precautions: Aspiration looking for blood return was conducted prior to all injections. At no point did we inject any substances, as a needle was being advanced. Before injecting, the patient was told to immediately notify me if she was experiencing any new onset of "ringing in the ears, or metallic taste in the mouth". No attempts were made at seeking any paresthesias. Safe injection  practices and needle disposal techniques used. Medications properly checked for expiration dates. SDV (single dose vial) medications used. After the completion of the procedure, all disposable equipment used was discarded in the proper designated medical waste containers. Local Anesthesia: Protocol guidelines were followed. The patient was positioned over the fluoroscopy table. The area was prepped  in the usual manner. The time-out was completed. The target area was identified using fluoroscopy. A 12-in long, straight, sterile hemostat was used with fluoroscopic guidance to locate the targets for each level blocked. Once located, the skin was marked with an approved surgical skin marker. Once all sites were marked, the skin (epidermis, dermis, and hypodermis), as well as deeper tissues (fat, connective tissue and muscle) were infiltrated with a small amount of a short-acting local anesthetic, loaded on a 10cc syringe with a 25G, 1.5-in  Needle. An appropriate amount of time was allowed for local anesthetics to take effect before proceeding to the next step. Local Anesthetic: Lidocaine 2.0% The unused portion of the local anesthetic was discarded in the proper designated containers. Technical explanation of process:  L2 Medial Branch Nerve Block (MBB): The target area for the L2 medial branch is at the junction of the postero-lateral aspect of the superior articular process and the superior, posterior, and medial edge of the transverse process of L3. Under fluoroscopic guidance, a Quincke needle was inserted until contact was made with os over the superior postero-lateral aspect of the pedicular shadow (target area). After negative aspiration for blood, 0.5 mL of the nerve block solution was injected without difficulty or complication. The needle was removed intact. L3 Medial Branch Nerve Block (MBB): The target area for the L3 medial branch is at the junction of the postero-lateral aspect of the superior  articular process and the superior, posterior, and medial edge of the transverse process of L4. Under fluoroscopic guidance, a Quincke needle was inserted until contact was made with os over the superior postero-lateral aspect of the pedicular shadow (target area). After negative aspiration for blood, 0.5 mL of the nerve block solution was injected without difficulty or complication. The needle was removed intact. L4 Medial Branch Nerve Block (MBB): The target area for the L4 medial branch is at the junction of the postero-lateral aspect of the superior articular process and the superior, posterior, and medial edge of the transverse process of L5. Under fluoroscopic guidance, a Quincke needle was inserted until contact was made with os over the superior postero-lateral aspect of the pedicular shadow (target area). After negative aspiration for blood, 0.5 mL of the nerve block solution was injected without difficulty or complication. The needle was removed intact. L5 Medial Branch Nerve Block (MBB): The target area for the L5 medial branch is at the junction of the postero-lateral aspect of the superior articular process and the superior, posterior, and medial edge of the sacral ala. Under fluoroscopic guidance, a Quincke needle was inserted until contact was made with os over the superior postero-lateral aspect of the pedicular shadow (target area). After negative aspiration for blood, 0.5 mL of the nerve block solution was injected without difficulty or complication. The needle was removed intact. S1 Medial Branch Nerve Block (MBB): The target area for the S1 medial branch is at the posterior and inferior 6 o'clock position of the L5-S1 facet joint. Under fluoroscopic guidance, the Quincke needle inserted for the L5 MBB was redirected until contact was made with os over the inferior and postero aspect of the sacrum, at the 6 o' clock position under the L5-S1 facet joint (Target area). After negative aspiration  for blood, 0.5 mL of the nerve block solution was injected without difficulty or complication. The needle was removed intact.  Nerve block solution: 0.2% PF-Ropivacaine + Triamcinolone (40 mg/mL) diluted to a final concentration of 4 mg of Triamcinolone/mL of Ropivacaine The unused portion  of the solution was discarded in the proper designated containers. Procedural Needles: 22-gauge, 3.5-inch, Quincke needles used for all levels.  Once the entire procedure was completed, the treated area was cleaned, making sure to leave some of the prepping solution back to take advantage of its long term bactericidal properties.   Illustration of the posterior view of the lumbar spine and the posterior neural structures. Laminae of L2 through S1 are labeled. DPRL5, dorsal primary ramus of L5; DPRS1, dorsal primary ramus of S1; DPR3, dorsal primary ramus of L3; FJ, facet (zygapophyseal) joint L3-L4; I, inferior articular process of L4; LB1, lateral branch of dorsal primary ramus of L1; IAB, inferior articular branches from L3 medial branch (supplies L4-L5 facet joint); IBP, intermediate branch plexus; MB3, medial branch of dorsal primary ramus of L3; NR3, third lumbar nerve root; S, superior articular process of L5; SAB, superior articular branches from L4 (supplies L4-5 facet joint also); TP3, transverse process of L3.  Vitals:   11/13/18 1027 11/13/18 1037 11/13/18 1046 11/13/18 1057  BP: (!) 114/94 135/84 125/90 (!) 126/99  Pulse:      Resp: 13 16 19 13   Temp:      TempSrc:      SpO2: 93% 98% 97% 100%  Weight:      Height:         Start Time: 1016 hrs. End Time: 1026 hrs.  Imaging Guidance (Spinal):          Type of Imaging Technique: Fluoroscopy Guidance (Spinal) Indication(s): Assistance in needle guidance and placement for procedures requiring needle placement in or near specific anatomical locations not easily accessible without such assistance. Exposure Time: Please see nurses notes. Contrast:  None used. Fluoroscopic Guidance: I was personally present during the use of fluoroscopy. "Tunnel Vision Technique" used to obtain the best possible view of the target area. Parallax error corrected before commencing the procedure. "Direction-depth-direction" technique used to introduce the needle under continuous pulsed fluoroscopy. Once target was reached, antero-posterior, oblique, and lateral fluoroscopic projection used confirm needle placement in all planes. Images permanently stored in EMR. Interpretation: No contrast injected. I personally interpreted the imaging intraoperatively. Adequate needle placement confirmed in multiple planes. Permanent images saved into the patient's record.  Antibiotic Prophylaxis:   Anti-infectives (From admission, onward)   None     Indication(s): None identified  Post-operative Assessment:  Post-procedure Vital Signs:  Pulse/HCG Rate: 8080 Temp: 97.8 F (36.6 C) Resp: 13 BP: (!) 126/99 SpO2: 100 %  EBL: None  Complications: No immediate post-treatment complications observed by team, or reported by patient.  Note: The patient tolerated the entire procedure well. A repeat set of vitals were taken after the procedure and the patient was kept under observation following institutional policy, for this type of procedure. Post-procedural neurological assessment was performed, showing return to baseline, prior to discharge. The patient was provided with post-procedure discharge instructions, including a section on how to identify potential problems. Should any problems arise concerning this procedure, the patient was given instructions to immediately contact us, at any time, without hesitation. In any case, we plan to contact the patient by telephone for a follow-up status report regarding this interventional procedure.  Comments:  No additional relevant information.  Plan of Care  Orders:  Orders Placed This Encounter  Procedures   LUMBAR FACET(MEDIAL  BRANCH NERVE BLOCK) MBNB    Scheduling Instructions:     Side: Bilateral     Level: L3-4, L4-5, & L5-S1 Facets (L2, L3, L4, L5, & S1  Medial Branch Nerves)     Sedation: With Sedation.     Timeframe: Today    Order Specific Question:   Where will this procedure be performed?    Answer:   [facility]   DG PAIN CLINIC C-ARM 1-60 MIN NO REPORT    Intraoperative interpretation by procedural physician at [place].    Standing Status:   Standing    Number of Occurrences:   1    Order Specific Question:   Reason for exam:    Answer:   Assistance in needle guidance and placement for procedures requiring needle placement in or near specific anatomical locations not easily accessible without such assistance.   Provider attestation of informed consent for procedure/surgical case    I, the ordering provider, attest that I have discussed with the patient the benefits, risks, side effects, alternatives, likelihood of achieving goals and potential problems during recovery for the procedure that I have provided informed consent.    Standing Status:   Standing    Number of Occurrences:   1   Informed Consent Details: Transcribe to consent form and obtain patient signature    Standing Status:   Standing    Number of Occurrences:   1    Order Specific Question:   Procedure    Answer:   Bilateral Lumbar facet block (medial branch block) under fluoroscopic guidance. (See notes for levels.)    Order Specific Question:   Surgeon    Answer:   Quintrell Baze A. Dossie Arbour, MD    Order Specific Question:   Indication/Reason    Answer:   Bilateral low back pain with or without lower extremity pain   Medications ordered for procedure: Meds ordered this encounter  Medications   lidocaine (XYLOCAINE) 2 % (with pres) injection 400 mg   lactated ringers infusion 1,000 mL   midazolam (VERSED) 5 MG/5ML injection 1-2 mg    Make sure Flumazenil is available in the pyxis when using this medication.  If oversedation occurs, administer 0.2 mg IV over 15 sec. If after 45 sec no response, administer 0.2 mg again over 1 min; may repeat at 1 min intervals; not to exceed 4 doses (1 mg)   fentaNYL (SUBLIMAZE) injection 25-50 mcg    Make sure Narcan is available in the pyxis when using this medication. In the event of respiratory depression (RR< 8/min): Titrate NARCAN (naloxone) in increments of 0.1 to 0.2 mg IV at 2-3 minute intervals, until desired degree of reversal.   ropivacaine (PF) 2 mg/mL (0.2%) (NAROPIN) injection 18 mL   triamcinolone acetonide (KENALOG-40) injection 80 mg   Medications administered: We administered lidocaine, lactated ringers, midazolam, fentaNYL, ropivacaine (PF) 2 mg/mL (0.2%), and triamcinolone acetonide.  See the medical record for exact dosing, route, and time of administration.  Disposition: Discharge home  Discharge Date & Time: 11/13/2018; 1100 hrs.   Follow-up plan:   Return in about 2 weeks (around 11/27/2018) for (Virtual), E/M (PP).     Future Appointments  Date Time Provider Ruth  11/26/2018  2:00 PM Milinda Pointer, MD [facility] None   Primary Care Physician: Tracie Harrier, MD Location: [facility] Note by: Gaspar Cola, MD Date: 11/13/2018; Time: 11:11 AM  Disclaimer:  Medicine is not an Chief Strategy Officer. The only guarantee in medicine is that nothing is guaranteed. It is important to note that the decision to proceed with this intervention was based on the information collected from the patient. The Data and conclusions  were drawn from the patient's questionnaire, the interview, and the physical examination. Because the information was provided in large part by the patient, it cannot be guaranteed that it has not been purposely or unconsciously manipulated. Every effort has been made to obtain as much relevant data as possible for this evaluation. It is important to note that the conclusions that  lead to this procedure are derived in large part from the available data. Always take into account that the treatment will also be dependent on availability of resources and existing treatment guidelines, considered by other Pain Management Practitioners as being common knowledge and practice, at the time of the intervention. For Medico-Legal purposes, it is also important to point out that variation in procedural techniques and pharmacological choices are the acceptable norm. The indications, contraindications, technique, and results of the above procedure should only be interpreted and judged by a Board-Certified Interventional Pain Specialist with extensive familiarity and expertise in the same exact procedure and technique.

## 2018-11-12 NOTE — Patient Instructions (Addendum)
____________________________________________________________________________________________  Post-Procedure Discharge Instructions  Instructions:  Apply ice:   Purpose: This will minimize any swelling and discomfort after procedure.   When: Day of procedure, as soon as you get home.  How: Fill a plastic sandwich bag with crushed ice. Cover it with a small towel and apply to injection site.  How long: (15 min on, 15 min off) Apply for 15 minutes then remove x 15 minutes.  Repeat sequence on day of procedure, until you go to bed.  Apply heat:   Purpose: To treat any soreness and discomfort from the procedure.  When: Starting the next day after the procedure.  How: Apply heat to procedure site starting the day following the procedure.  How long: May continue to repeat daily, until discomfort goes away.  Food intake: Start with clear liquids (like water) and advance to regular food, as tolerated.   Physical activities: Keep activities to a minimum for the first 8 hours after the procedure. After that, then as tolerated.  Driving: If you have received any sedation, be responsible and do not drive. You are not allowed to drive for 24 hours after having sedation.  Blood thinner: (Applies only to those taking blood thinners) You may restart your blood thinner 6 hours after your procedure.  Insulin: (Applies only to Diabetic patients taking insulin) As soon as you can eat, you may resume your normal dosing schedule.  Infection prevention: Keep procedure site clean and dry. Shower daily and clean area with soap and water.  Post-procedure Pain Diary: Extremely important that this be done correctly and accurately. Recorded information will be used to determine the next step in treatment. For the purpose of accuracy, follow these rules:  Evaluate only the area treated. Do not report or include pain from an untreated area. For the purpose of this evaluation, ignore all other areas of pain,  except for the treated area.  After your procedure, avoid taking a long nap and attempting to complete the pain diary after you wake up. Instead, set your alarm clock to go off every hour, on the hour, for the initial 8 hours after the procedure. Document the duration of the numbing medicine, and the relief you are getting from it.  Do not go to sleep and attempt to complete it later. It will not be accurate. If you received sedation, it is likely that you were given a medication that may cause amnesia. Because of this, completing the diary at a later time may cause the information to be inaccurate. This information is needed to plan your care.  Follow-up appointment: Keep your post-procedure follow-up evaluation appointment after the procedure (usually 2 weeks for most procedures, 6 weeks for radiofrequencies). DO NOT FORGET to bring you pain diary with you.   Expect: (What should I expect to see with my procedure?)  From numbing medicine (AKA: Local Anesthetics): Numbness or decrease in pain. You may also experience some weakness, which if present, could last for the duration of the local anesthetic.  Onset: Full effect within 15 minutes of injected.  Duration: It will depend on the type of local anesthetic used. On the average, 1 to 8 hours.   From steroids (Applies only if steroids were used): Decrease in swelling or inflammation. Once inflammation is improved, relief of the pain will follow.  Onset of benefits: Depends on the amount of swelling present. The more swelling, the longer it will take for the benefits to be seen. In some cases, up to 10 days.    Duration: Steroids will stay in the system x 2 weeks. Duration of benefits will depend on multiple posibilities including persistent irritating factors.  Side-effects: If present, they may typically last 2 weeks (the duration of the steroids).  Frequent: Cramps (if they occur, drink Gatorade and take over-the-counter Magnesium 450-500 mg  once to twice a day); water retention with temporary weight gain; increases in blood sugar; decreased immune system response; increased appetite.  Occasional: Facial flushing (red, warm cheeks); mood swings; menstrual changes.  Uncommon: Long-term decrease or suppression of natural hormones; bone thinning. (These are more common with higher doses or more frequent use. This is why we prefer that our patients avoid having any injection therapies in other practices.)   Very Rare: Severe mood changes; psychosis; aseptic necrosis.  From procedure: Some discomfort is to be expected once the numbing medicine wears off. This should be minimal if ice and heat are applied as instructed.  Call if: (When should I call?)  You experience numbness and weakness that gets worse with time, as opposed to wearing off.  New onset bowel or bladder incontinence. (Applies only to procedures done in the spine)  Emergency Numbers:  Durning business hours (Monday - Thursday, 8:00 AM - 4:00 PM) (Friday, 9:00 AM - 12:00 Noon): [phone]  After hours: [phone]  NOTE: If you are having a problem and are unable connect with, or to talk to a provider, then go to your nearest urgent care or emergency department. If the problem is serious and urgent, please call 911. ____________________________________________________________________________________________ Please complete your Post Procedure Diary.

## 2018-11-13 ENCOUNTER — Encounter: Payer: Self-pay | Admitting: Pain Medicine

## 2018-11-13 ENCOUNTER — Ambulatory Visit
Admission: RE | Admit: 2018-11-13 | Discharge: 2018-11-13 | Disposition: A | Discharge status: Home / Self Care | Payer: Medicare Other | Source: Ambulatory Visit | Attending: Pain Medicine | Admitting: Pain Medicine

## 2018-11-13 ENCOUNTER — Other Ambulatory Visit: Payer: Self-pay

## 2018-11-13 ENCOUNTER — Ambulatory Visit (HOSPITAL_BASED_OUTPATIENT_CLINIC_OR_DEPARTMENT_OTHER): Payer: Medicare Other | Admitting: Pain Medicine

## 2018-11-13 VITALS — BP 126/99 | HR 80 | Temp 97.8°F | Resp 13 | Ht 67.0 in | Wt 220.0 lb

## 2018-11-13 DIAGNOSIS — M5441 Lumbago with sciatica, right side: Secondary | ICD-10-CM

## 2018-11-13 DIAGNOSIS — M488X7 Other specified spondylopathies, lumbosacral region: Secondary | ICD-10-CM | POA: Insufficient documentation

## 2018-11-13 DIAGNOSIS — G8929 Other chronic pain: Secondary | ICD-10-CM

## 2018-11-13 DIAGNOSIS — M47816 Spondylosis without myelopathy or radiculopathy, lumbar region: Secondary | ICD-10-CM | POA: Diagnosis present

## 2018-11-13 DIAGNOSIS — M5442 Lumbago with sciatica, left side: Secondary | ICD-10-CM | POA: Insufficient documentation

## 2018-11-13 MED ORDER — FENTANYL CITRATE (PF) 100 MCG/2ML IJ SOLN
25.0000 ug | INTRAMUSCULAR | Status: DC | PRN
  Administered 2018-11-13: 50 ug via INTRAVENOUS
  Filled 2018-11-13: qty 2

## 2018-11-13 MED ORDER — ROPIVACAINE HCL 2 MG/ML IJ SOLN
18.0000 mL | Freq: Once | INTRAMUSCULAR | Status: AC
  Administered 2018-11-13: 10 mL via PERINEURAL
  Filled 2018-11-13: qty 20

## 2018-11-13 MED ORDER — LIDOCAINE HCL 2 % IJ SOLN
20.0000 mL | Freq: Once | INTRAMUSCULAR | Status: AC
  Administered 2018-11-13: 400 mg
  Filled 2018-11-13: qty 40

## 2018-11-13 MED ORDER — MIDAZOLAM HCL 5 MG/5ML IJ SOLN
1.0000 mg | INTRAMUSCULAR | Status: DC | PRN
  Administered 2018-11-13: 2 mg via INTRAVENOUS
  Filled 2018-11-13: qty 5

## 2018-11-13 MED ORDER — LACTATED RINGERS IV SOLN
1000.0000 mL | Freq: Once | INTRAVENOUS | Status: AC
  Administered 2018-11-13: 1000 mL via INTRAVENOUS

## 2018-11-13 MED ORDER — TRIAMCINOLONE ACETONIDE 40 MG/ML IJ SUSP
80.0000 mg | Freq: Once | INTRAMUSCULAR | Status: AC
  Administered 2018-11-13: 40 mg
  Filled 2018-11-13: qty 2

## 2018-11-14 ENCOUNTER — Telehealth: Payer: Self-pay

## 2018-11-14 NOTE — Telephone Encounter (Signed)
Returned patient call.  She states that she has a bruise where the IV was.  Denies swelling, pain, numbness or tingling in extremities or heat.  States bruise is blue/purple in color.  Instructed patient to place ice to area and to go get it checked at ED or PCP if she had any worsening of symptoms or felt concerned.  Patient states understanding.

## 2018-11-14 NOTE — Telephone Encounter (Signed)
Post procedure phone call. Patient states she is doing good.  

## 2018-11-25 ENCOUNTER — Encounter: Payer: Self-pay | Admitting: Pain Medicine

## 2018-11-26 ENCOUNTER — Other Ambulatory Visit: Payer: Self-pay

## 2018-11-26 ENCOUNTER — Ambulatory Visit: Payer: Medicare Other | Attending: Pain Medicine | Admitting: Pain Medicine

## 2018-11-26 DIAGNOSIS — M4802 Spinal stenosis, cervical region: Secondary | ICD-10-CM

## 2018-11-26 DIAGNOSIS — M5441 Lumbago with sciatica, right side: Secondary | ICD-10-CM

## 2018-11-26 DIAGNOSIS — G894 Chronic pain syndrome: Secondary | ICD-10-CM | POA: Diagnosis not present

## 2018-11-26 DIAGNOSIS — G959 Disease of spinal cord, unspecified: Secondary | ICD-10-CM

## 2018-11-26 DIAGNOSIS — G8929 Other chronic pain: Secondary | ICD-10-CM

## 2018-11-26 DIAGNOSIS — M5030 Other cervical disc degeneration, unspecified cervical region: Secondary | ICD-10-CM

## 2018-11-26 DIAGNOSIS — M47816 Spondylosis without myelopathy or radiculopathy, lumbar region: Secondary | ICD-10-CM | POA: Diagnosis not present

## 2018-11-26 DIAGNOSIS — M5442 Lumbago with sciatica, left side: Secondary | ICD-10-CM | POA: Diagnosis not present

## 2018-11-26 DIAGNOSIS — M4712 Other spondylosis with myelopathy, cervical region: Secondary | ICD-10-CM

## 2018-11-26 NOTE — Progress Notes (Signed)
Pain Management Virtual Encounter Note - Virtual Visit via Telephone Telehealth (real-time audio visits between healthcare provider and patient).   Patient's Phone No. & Preferred Pharmacy:  [phone] (home); [phone] (mobile); (Preferred) [phone] No e-mail address on record  [facility], [address] [place] [address] Phone: [phone] Fax: [phone]  [facility] [place], [address] [place] [address] Phone: [phone] Fax: [phone]    Pre-screening note:  Our staff contacted Samantha Macias and offered her an "in person", "face-to-face" appointment versus a telephone encounter. She indicated preferring the telephone encounter, at this time.   Reason for Virtual Visit: COVID-19*  Social distancing based on CDC and AMA recommendations.   I contacted Samantha Macias on 11/26/2018 via telephone.      I clearly identified myself as Gaspar Cola, MD. I verified that I was speaking with the correct person using two identifiers (Name: Samantha Macias, and date of birth: April 24, 1961).  Advanced Informed Consent I sought verbal advanced consent from Samantha Macias for virtual visit interactions. I informed Samantha Macias of possible security and privacy concerns, risks, and limitations associated with providing "not-in-person" medical evaluation and management services. I also informed Samantha Macias of the availability of "in-person" appointments. Finally, I informed her that there would be a charge for the virtual visit and that she could be  personally, fully or partially, financially responsible for it. Samantha Macias expressed understanding and agreed to proceed.   Historic Elements   Samantha Macias is a 58 y.o. year old, female patient evaluated today after her last encounter by our practice on 11/14/2018. Samantha Macias  has a past medical  history of Abnormal gait, Allergy, Anxiety, Arthritis, Back pain, Bipolar disorder ([place]), CAD (coronary artery disease), Chronic kidney disease, Collagen vascular disease ([place]), Colon polyp, COPD (chronic obstructive pulmonary disease) ([place]), Falls, Fibromyalgia, GERD (gastroesophageal reflux disease), Hyperlipidemia, Hypertension, IBS (irritable bowel syndrome), Kidney disease, Migraine, Numbness and tingling, Renal insufficiency, Spinal stenosis, Static encephalopathy, Thyroid nodule (07/2017), and TIA (transient ischemic attack). She also  has a past surgical history that includes Tubal ligation; Nasal sinus surgery; Colonoscopy with propofol (N/A, 04/01/2017); Esophagogastroduodenoscopy (egd) with propofol (N/A, 04/01/2017); and Mouth surgery. Samantha Macias has a current medication list which includes the following prescription(s): albuterol, alprazolam, aspirin, aspirin ec, bupropion, vitamin d, diclofenac sodium, tylenol pm extra strength, donepezil, doxepin, formoterol, ibuprofen, ipratropium, losartan-hydrochlorothiazide, melatonin, meloxicam, mirtazapine, omeprazole, pantoprazole, pregabalin, quetiapine, ramelteon, sertraline, spiriva handihaler, anoro ellipta, doxepin, tizanidine, tramadol, and tramadol. She  reports that she has been smoking e-cigarettes. She has a 6.25 pack-year smoking history. She has never used smokeless tobacco. She reports current drug use. Drug: Cocaine. She reports that she does not drink alcohol. Samantha Macias is allergic to shellfish allergy; tetanus toxoid adsorbed; ace inhibitors; codeine; nitrofurantoin monohyd macro; and red dye.   HPI  Today, she is being contacted for a post-procedure assessment.  The patient appears to have attained excellent benefit from the diagnostic bilateral lumbar facet block.  She refers that even though she has been to other pain clinics in the past, none had given her such good benefit.  At this point she is doing so well that she does not seem to  need anything done in the lower back.  I have warned the patient that this may be transient since review of the  lumbar and cervical MRIs would indicate that she has quite a bit of spondylosis and changes that are likely to cause her back pain to return.  Today she indicates that the next area that she would like for me to work on answer upper back.  She describes having pain between the shoulder blades that is bilateral with the left side being worse than the right.  In addition she points out that she has been experiencing bilateral upper extremity numbness affecting primarily the pinky finger and the ring finger.  Again this seems to be more significant on the left side when compared to the right.  Review of the MRI would indicate that the patient has significant pathology in the cervical spine that could account for the symptoms that she is describing.  The pain between the shoulder blades is likely to be coming from the area of the C5 nerve root as this one innervates the rhomboid muscles.  Based on the review of her history as well as available imaging, we have decided to bring the patient in for a left-sided cervical epidural steroid injection under fluoroscopic guidance.  The patient has requested IV sedation.  Post-Procedure Evaluation  Procedure: Diagnostic bilateral lumbar facet block #1 under fluoroscopic guidance and IV sedation Pre-procedure pain level:  8/10 Post-procedure: 3/10 (> 50% relief)  Sedation: Sedation provided.  Effectiveness during initial hour after procedure(Ultra-Short Term Relief): 100 %   Local anesthetic used: Long-acting (4-6 hours) Effectiveness: Defined as any analgesic benefit obtained secondary to the administration of local anesthetics. This carries significant diagnostic value as to the etiological location, or anatomical origin, of the pain. Duration of benefit is expected to coincide with the duration of the local anesthetic used.  Effectiveness during initial 4-6  hours after procedure(Short-Term Relief): 100 %   Long-term benefit: Defined as any relief past the pharmacologic duration of the local anesthetics.  Effectiveness past the initial 6 hours after procedure(Long-Term Relief): 75 %   Current benefits: Defined as benefit that persist at this time.   Analgesia:  >50% relief Function: Samantha Macias reports improvement in function.  The patient is extremely happy with the results and she indicates that since we treated her lower back, she has suffered no more falls and is no longer afraid of walking. ROM: Samantha Macias reports improvement in ROM  Pertinent Labs   SAFETY SCREENING Profile Lab Results  Component Value Date   SARSCOV2NAA NOT DETECTED 11/10/2018   COVIDSOURCE NASOPHARYNGEAL 11/10/2018   STAPHAUREUS NEGATIVE 07/10/2017   MRSAPCR NEGATIVE 07/10/2017   Renal Function Lab Results  Component Value Date   BUN 22 03/17/2018   CREATININE 1.35 (H) 03/17/2018   BCR 16 03/17/2018   GFRAA 51 (L) 03/17/2018   GFRNONAA 44 (L) 03/17/2018   Hepatic Function Lab Results  Component Value Date   AST 13 03/17/2018   ALT 15 12/24/2017   ALBUMIN 4.4 03/17/2018   UDS Summary  Date Value Ref Range Status  03/17/2018 FINAL  Final    Comment:    ==================================================================== TOXASSURE COMP DRUG ANALYSIS,UR ==================================================================== Test                             Result       Flag       Units Drug Present and Declared for Prescription Verification   Tramadol  1825         EXPECTED   ng/mg creat   O-Desmethyltramadol            929          EXPECTED   ng/mg creat   N-Desmethyltramadol            1352         EXPECTED   ng/mg creat    Source of tramadol is a prescription medication.    O-desmethyltramadol and N-desmethyltramadol are expected    metabolites of tramadol.   Pregabalin                     PRESENT      EXPECTED   Tizanidine                      PRESENT      EXPECTED   Bupropion                      PRESENT      EXPECTED   Hydroxybupropion               PRESENT      EXPECTED    Hydroxybupropion is an expected metabolite of bupropion.   Mirtazapine                    PRESENT      EXPECTED   Quetiapine                     PRESENT      EXPECTED   Salicylate                     PRESENT      EXPECTED Drug Present not Declared for Prescription Verification   Benzoylecgonine                494          UNEXPECTED ng/mg creat    Benzoylecgonine is a metabolite of cocaine; its presence    indicates use of this drug.  Source is most commonly illicit, but    cocaine is present in some topical anesthetic solutions.   Acetaminophen                  PRESENT      UNEXPECTED   Diphenhydramine                PRESENT      UNEXPECTED Drug Absent but Declared for Prescription Verification   Alprazolam                     Not Detected UNEXPECTED ng/mg creat   Sertraline                     Not Detected UNEXPECTED   Diclofenac                     Not Detected UNEXPECTED    Topical diclofenac, as indicated in the declared medication list,    is not always detected even when used as directed.   Ibuprofen                      Not Detected UNEXPECTED    Ibuprofen, as indicated in the declared medication list, is not    always detected even when used as directed. ==================================================================== Test  Result    Flag   Units      Ref Range   Creatinine              159              mg/dL      >=20 ==================================================================== Declared Medications:  The flagging and interpretation on this report are based on the  following declared medications.  Unexpected results may arise from  inaccuracies in the declared medications.  **Note: The testing scope of this panel includes these medications:  Alprazolam (Xanax)  Bupropion (Wellbutrin)   Mirtazapine (Remeron)  Pregabalin (Lyrica)  Quetiapine (Seroquel)  Sertraline (Zoloft)  Tramadol (Ultram)  **Note: The testing scope of this panel does not include small to  moderate amounts of these reported medications:  Aspirin (Aspirin 81)  Ibuprofen  Tizanidine (Zanaflex)  Topical Diclofenac  **Note: The testing scope of this panel does not include following  reported medications:  Albuterol  Donepezil (Aricept)  Formoterol (Perforomist)  Hydrochlorothiazide (Hyzaar)  Ipratropium  Losartan (Hyzaar)  Omeprazole (Prilosec)  Pantoprazole (Protonix)  Ramelteon (Rozerem)  Tiotropium (Spiriva)  Vitamin D ==================================================================== For clinical consultation, please call [phone]. ====================================================================    Note: Above Lab results reviewed.  Recent imaging  DG Foot Complete Right CLINICAL DATA:  Right foot pain after injury last night.  EXAM: RIGHT FOOT COMPLETE - 3+ VIEW  COMPARISON:  Radiographs of December 10, 2016.  FINDINGS: There is no evidence of fracture or dislocation. Stable degenerative changes seen involving first metatarsophalangeal joint. Mild posterior calcaneal spurring is noted. Soft tissues are unremarkable.  IMPRESSION: No acute abnormality seen in the right foot.  Electronically Signed   By: Marijo Conception, M.D.   On: 07/30/2018 11:01  Assessment  The primary encounter diagnosis was Chronic pain syndrome. Diagnoses of Chronic low back pain (Primary Area of Pain) (Bilateral) (L>R) w/ sciatica, Lumbar facet syndrome (Bilateral), Lumbar facet hypertrophy (Bilateral), Cervical central spinal stenosis, Cervical foraminal stenosis, Cervical myelopathy (HCC), Cervical spondylosis with myelopathy, and DDD (degenerative disc disease), cervical were also pertinent to this visit.  Plan of Care  I am having Samantha Macias. Samantha Macias maintain her Vitamin D, ibuprofen,  losartan-hydrochlorothiazide, aspirin EC, traMADol, diclofenac sodium, ramelteon, ALPRAZolam, pregabalin, tiZANidine, donepezil, omeprazole, Spiriva HandiHaler, buPROPion, mirtazapine, sertraline, QUEtiapine, pantoprazole, albuterol, formoterol, ipratropium, traMADol, Tylenol PM Extra Strength, doxepin, doxepin, Melatonin, meloxicam, Anoro Ellipta, and aspirin.  Pharmacotherapy (Medications Ordered): No orders of the defined types were placed in this encounter.  Orders:  Orders Placed This Encounter  Procedures  . Cervical Epidural Injection    Level(s): C7-T1 Laterality: Left-sided Purpose: Diagnostic/Therapeutic Indication(s): Radiculitis and cervicalgia associater with cervical degenerative disc disease.    Standing Status:   Future    Standing Expiration Date:   12/26/2018    Scheduling Instructions:     Procedure: Cervical Epidural Steroid Injection/Block     Sedation: With Sedation.     Timeframe: As soon as schedule allows    Order Specific Question:   Where will this procedure be performed?    Answer:   [facility]    Comments:   by Dr. Dossie Arbour   Follow-up plan:   Return for Procedure (w/ sedation): (L) CESI #1, (ASAP).    Recent Visits Date Type Provider Dept  11/13/18 Procedure visit Milinda Pointer, MD [facility]  09/03/18 Office Visit Milinda Pointer, MD [facility]  Showing recent visits within past 90 days and meeting all other requirements   Today's Visits Date  Type Provider Dept  11/26/18 Office Visit Milinda Pointer, MD [facility]  Showing today's visits and meeting all other requirements   Future Appointments No visits were found meeting these conditions.  Showing future appointments within next 90 days and meeting all other requirements   I discussed the assessment and treatment plan with the patient. The patient was provided an opportunity to ask questions and all were answered. The patient agreed with the  plan and demonstrated an understanding of the instructions.  Patient advised to call back or seek an in-person evaluation if the symptoms or condition worsens.  Total duration of non-face-to-face encounter: 18 minutes.  Note by: Gaspar Cola, MD Date: 11/26/2018; Time: 3:52 PM  Note: This dictation was prepared with Dragon dictation. Any transcriptional errors that may result from this process are unintentional.  Disclaimer:  * Given the special circumstances of the COVID-19 pandemic, the federal government has announced that the Office for Civil Rights (OCR) will exercise its enforcement discretion and will not impose penalties on physicians using telehealth in the event of noncompliance with regulatory requirements under the [place] (HIPAA) in connection with the good faith provision of telehealth during the [place]MRA-15 national public health emergency. ([place])

## 2018-11-26 NOTE — Patient Instructions (Signed)

## 2018-11-27 ENCOUNTER — Encounter: Payer: Self-pay | Admitting: *Deleted

## 2018-11-27 ENCOUNTER — Telehealth: Payer: Self-pay | Admitting: Pain Medicine

## 2018-11-27 NOTE — Telephone Encounter (Signed)
Pt called and stated that she would like to know if Dr Delane Ginger could send a note saying that her aid needed to be quarantined with her when she gets her COVID testing done for procedure. Pt states she doesn't know where her aid goes when she leaves there and wants to make sure she doesn't go around anyone who could be sick while waiting for her procedure.

## 2018-11-27 NOTE — Telephone Encounter (Signed)
Patients questions answered

## 2018-12-01 ENCOUNTER — Other Ambulatory Visit: Payer: Self-pay

## 2018-12-01 ENCOUNTER — Telehealth: Payer: Self-pay

## 2018-12-01 ENCOUNTER — Other Ambulatory Visit
Admission: RE | Admit: 2018-12-01 | Discharge: 2018-12-01 | Disposition: A | Discharge status: Home / Self Care | Payer: Medicare Other | Source: Ambulatory Visit | Attending: Pain Medicine | Admitting: Pain Medicine

## 2018-12-01 DIAGNOSIS — Z01812 Encounter for preprocedural laboratory examination: Secondary | ICD-10-CM | POA: Diagnosis present

## 2018-12-01 DIAGNOSIS — Z1159 Encounter for screening for other viral diseases: Secondary | ICD-10-CM | POA: Diagnosis not present

## 2018-12-01 NOTE — Telephone Encounter (Signed)
Pt called and is having Covid testing done today and is scheduled for procedure Thurs with Dr Dossie Arbour. Can we fax a note to her case worker Angelica stating she needs to be on quarantine between now and thurs

## 2018-12-01 NOTE — Telephone Encounter (Signed)
Spoke with patient and she gave me information to get in touch with Angelica;  [phone] fax, [phone] Mobile.   Spoke with Angelica and she just needed a letter stating that recommendation after having covid testing prior to procedure is to quarantine.  I sent letter stating this.

## 2018-12-02 LAB — NOVEL CORONAVIRUS, NAA (HOSP ORDER, SEND-OUT TO REF LAB; TAT 18-24 HRS): SARS-CoV-2, NAA: NOT DETECTED

## 2018-12-03 NOTE — Patient Instructions (Signed)

## 2018-12-04 ENCOUNTER — Other Ambulatory Visit: Payer: Self-pay

## 2018-12-04 ENCOUNTER — Ambulatory Visit (HOSPITAL_BASED_OUTPATIENT_CLINIC_OR_DEPARTMENT_OTHER): Payer: Medicare Other | Admitting: Pain Medicine

## 2018-12-04 ENCOUNTER — Ambulatory Visit
Admission: RE | Admit: 2018-12-04 | Discharge: 2018-12-04 | Disposition: A | Discharge status: Home / Self Care | Payer: Medicare Other | Source: Ambulatory Visit | Attending: Pain Medicine | Admitting: Pain Medicine

## 2018-12-04 ENCOUNTER — Encounter: Payer: Self-pay | Admitting: Pain Medicine

## 2018-12-04 VITALS — BP 101/59 | HR 89 | Temp 97.4°F | Resp 16 | Ht 68.0 in | Wt 224.0 lb

## 2018-12-04 DIAGNOSIS — M79641 Pain in right hand: Secondary | ICD-10-CM | POA: Diagnosis present

## 2018-12-04 DIAGNOSIS — G8929 Other chronic pain: Secondary | ICD-10-CM | POA: Diagnosis present

## 2018-12-04 DIAGNOSIS — M5030 Other cervical disc degeneration, unspecified cervical region: Secondary | ICD-10-CM | POA: Insufficient documentation

## 2018-12-04 DIAGNOSIS — G959 Disease of spinal cord, unspecified: Secondary | ICD-10-CM

## 2018-12-04 DIAGNOSIS — M549 Dorsalgia, unspecified: Secondary | ICD-10-CM | POA: Diagnosis present

## 2018-12-04 DIAGNOSIS — M4722 Other spondylosis with radiculopathy, cervical region: Secondary | ICD-10-CM | POA: Diagnosis present

## 2018-12-04 DIAGNOSIS — M79642 Pain in left hand: Secondary | ICD-10-CM | POA: Insufficient documentation

## 2018-12-04 DIAGNOSIS — M4802 Spinal stenosis, cervical region: Secondary | ICD-10-CM

## 2018-12-04 MED ORDER — FENTANYL CITRATE (PF) 100 MCG/2ML IJ SOLN
INTRAMUSCULAR | Status: AC
  Filled 2018-12-04: qty 2

## 2018-12-04 MED ORDER — DEXAMETHASONE SODIUM PHOSPHATE 10 MG/ML IJ SOLN
INTRAMUSCULAR | Status: AC
  Filled 2018-12-04: qty 1

## 2018-12-04 MED ORDER — SODIUM CHLORIDE (PF) 0.9 % IJ SOLN
INTRAMUSCULAR | Status: AC
  Filled 2018-12-04: qty 10

## 2018-12-04 MED ORDER — DEXAMETHASONE SODIUM PHOSPHATE 10 MG/ML IJ SOLN
10.0000 mg | Freq: Once | INTRAMUSCULAR | Status: AC
  Administered 2018-12-04: 10 mg

## 2018-12-04 MED ORDER — FENTANYL CITRATE (PF) 100 MCG/2ML IJ SOLN
25.0000 ug | INTRAMUSCULAR | Status: DC | PRN
  Administered 2018-12-04: 09:00:00 100 ug via INTRAVENOUS

## 2018-12-04 MED ORDER — LIDOCAINE HCL 2 % IJ SOLN
20.0000 mL | Freq: Once | INTRAMUSCULAR | Status: AC
  Administered 2018-12-04: 09:00:00 400 mg

## 2018-12-04 MED ORDER — LACTATED RINGERS IV SOLN: 1000.0000 mL | Freq: Once | INTRAVENOUS | Status: DC

## 2018-12-04 MED ORDER — IOHEXOL 180 MG/ML  SOLN: 10.0000 mL | Freq: Once | INTRAMUSCULAR | Status: DC

## 2018-12-04 MED ORDER — MIDAZOLAM HCL 5 MG/5ML IJ SOLN
1.0000 mg | INTRAMUSCULAR | Status: DC | PRN
  Administered 2018-12-04: 09:00:00 2 mg via INTRAVENOUS

## 2018-12-04 MED ORDER — LIDOCAINE HCL 2 % IJ SOLN
INTRAMUSCULAR | Status: AC
  Filled 2018-12-04: qty 20

## 2018-12-04 MED ORDER — ROPIVACAINE HCL 2 MG/ML IJ SOLN
INTRAMUSCULAR | Status: AC
  Filled 2018-12-04: qty 10

## 2018-12-04 MED ORDER — SODIUM CHLORIDE 0.9% FLUSH
1.0000 mL | Freq: Once | INTRAVENOUS | Status: AC
  Administered 2018-12-04: 1 mL

## 2018-12-04 MED ORDER — ROPIVACAINE HCL 2 MG/ML IJ SOLN
1.0000 mL | Freq: Once | INTRAMUSCULAR | Status: AC
  Administered 2018-12-04: 1 mL via EPIDURAL

## 2018-12-04 MED ORDER — MIDAZOLAM HCL 5 MG/5ML IJ SOLN
INTRAMUSCULAR | Status: AC
  Filled 2018-12-04: qty 5

## 2018-12-04 NOTE — Progress Notes (Signed)
Patient's Name: Samantha Macias  MRN: [id number]  Referring Provider: Tracie Harrier, MD  DOB: 1960-09-26  PCP: Tracie Harrier, MD  DOS: 12/04/2018  Note by: Gaspar Cola, MD  Service setting: Ambulatory outpatient  Specialty: Interventional Pain Management  Patient type: Established  Location: [facility] (AMB) Pain Management Facility  Visit type: Interventional Procedure   Primary Reason for Visit: Interventional Pain Management Treatment. CC: Back Pain  Procedure:          Anesthesia, Analgesia, Anxiolysis:  Type: Diagnostic, Inter-Laminar, Cervical Epidural Steroid Injection  #1  Region: Posterior Cervico-thoracic Region Level: C7-T1 Laterality: Left-Sided Paramedial  Type: Moderate (Conscious) Sedation combined with Local Anesthesia Indication(s): Analgesia and Anxiety Route: Intravenous (IV) IV Access: Secured Sedation: Meaningful verbal contact was maintained at all times during the procedure  Local Anesthetic: Lidocaine 1-2%  Position: Prone with head of the table was raised to facilitate breathing.   Indications: 1. DDD (degenerative disc disease), cervical   2. Cervical central spinal stenosis   3. Cervical myelopathy ([place])   4. Osteoarthritis of spine with radiculopathy, cervical region   5. Chronic hand pain (Fourth Area of Pain) (Bilateral) (R>L)   6. Chronic upper back pain [facility] Area of Pain) (Bilateral) (R>L)    Pain Score: Pre-procedure: 6 /10 Post-procedure: 4 /10  Pre-op Assessment:  Samantha Macias is a 58 y.o. (year old), female patient, seen today for interventional treatment. She  has a past surgical history that includes Tubal ligation; Nasal sinus surgery; Colonoscopy with propofol (N/A, 04/01/2017); Esophagogastroduodenoscopy (egd) with propofol (N/A, 04/01/2017); and Mouth surgery. Samantha Macias has a current medication list which includes the following prescription(s): albuterol, alprazolam, aspirin, aspirin ec, bupropion, vitamin d, diclofenac  sodium, tylenol pm extra strength, donepezil, doxepin, doxepin, formoterol, ibuprofen, ipratropium, losartan-hydrochlorothiazide, melatonin, meloxicam, mirtazapine, omeprazole, pantoprazole, pregabalin, quetiapine, ramelteon, sertraline, spiriva handihaler, tizanidine, anoro ellipta, tramadol, and tramadol, and the following Facility-Administered Medications: fentanyl, lactated ringers, and midazolam. Her primarily concern today is the Back Pain  Initial Vital Signs:  Pulse/HCG Rate: 89ECG Heart Rate: 81 Temp: 97.9 F (36.6 C) Resp: 14 BP: 108/71 SpO2: 95 %  BMI: Estimated body mass index is 34.06 kg/m as calculated from the following:   Height as of this encounter: 5\' 8"  (1.727 m).   Weight as of this encounter: 224 lb (101.6 kg).  Risk Assessment: Allergies: Reviewed. She is allergic to contrast media [iodinated diagnostic agents]; shellfish allergy; tetanus toxoid adsorbed; ace inhibitors; codeine; nitrofurantoin monohyd macro; and red dye.  Allergy Precautions: None required Coagulopathies: Reviewed. None identified.  Blood-thinner therapy: None at this time Active Infection(s): Reviewed. None identified. Samantha Macias is afebrile  Site Confirmation: Samantha Macias was asked to confirm the procedure and laterality before marking the site Procedure checklist: Completed Consent: Before the procedure and under the influence of no sedative(s), amnesic(s), or anxiolytics, the patient was informed of the treatment options, risks and possible complications. To fulfill our ethical and legal obligations, as recommended by the American Medical Association's Code of Ethics, I have informed the patient of my clinical impression; the nature and purpose of the treatment or procedure; the risks, benefits, and possible complications of the intervention; the alternatives, including doing nothing; the risk(s) and benefit(s) of the alternative treatment(s) or procedure(s); and the risk(s) and benefit(s) of doing  nothing. The patient was provided information about the general risks and possible complications associated with the procedure. These may include, but are not limited to: failure to achieve desired goals, infection, bleeding, organ or nerve damage, allergic  reactions, paralysis, and death. In addition, the patient was informed of those risks and complications associated to Spine-related procedures, such as failure to decrease pain; infection (i.e.: Meningitis, epidural or intraspinal abscess); bleeding (i.e.: epidural hematoma, subarachnoid hemorrhage, or any other type of intraspinal or peri-dural bleeding); organ or nerve damage (i.e.: Any type of peripheral nerve, nerve root, or spinal cord injury) with subsequent damage to sensory, motor, and/or autonomic systems, resulting in permanent pain, numbness, and/or weakness of one or several areas of the body; allergic reactions; (i.e.: anaphylactic reaction); and/or death. Furthermore, the patient was informed of those risks and complications associated with the medications. These include, but are not limited to: allergic reactions (i.e.: anaphylactic or anaphylactoid reaction(s)); adrenal axis suppression; blood sugar elevation that in diabetics may result in ketoacidosis or comma; water retention that in patients with history of congestive heart failure may result in shortness of breath, pulmonary edema, and decompensation with resultant heart failure; weight gain; swelling or edema; medication-induced neural toxicity; particulate matter embolism and blood vessel occlusion with resultant organ, and/or nervous system infarction; and/or aseptic necrosis of one or more joints. Finally, the patient was informed that Medicine is not an exact science; therefore, there is also the possibility of unforeseen or unpredictable risks and/or possible complications that may result in a catastrophic outcome. The patient indicated having understood very clearly. We have given  the patient no guarantees and we have made no promises. Enough time was given to the patient to ask questions, all of which were answered to the patient's satisfaction. Samantha Macias has indicated that she wanted to continue with the procedure. Attestation: I, the ordering provider, attest that I have discussed with the patient the benefits, risks, side-effects, alternatives, likelihood of achieving goals, and potential problems during recovery for the procedure that I have provided informed consent. Date  Time: 12/04/2018  8:14 AM  Pre-Procedure Preparation:  Monitoring: As per clinic protocol. Respiration, ETCO2, SpO2, BP, heart rate and rhythm monitor placed and checked for adequate function Safety Precautions: Patient was assessed for positional comfort and pressure points before starting the procedure. Time-out: I initiated and conducted the "Time-out" before starting the procedure, as per protocol. The patient was asked to participate by confirming the accuracy of the "Time Out" information. Verification of the correct person, site, and procedure were performed and confirmed by me, the nursing staff, and the patient. "Time-out" conducted as per Joint Commission's Universal Protocol (UP.01.01.01). Time: 1610  Description of Procedure:          Target Area: For Epidural Steroid injections the target is the interlaminar space, initially targeting the lower border of the superior vertebral body lamina. Approach: Paramedial approach. Area Prepped: Entire PosteriorCervical Region Prepping solution: DuraPrep (Iodine Povacrylex [0.7% available iodine] and Isopropyl Alcohol, 74% w/w) Safety Precautions: Aspiration looking for blood return was conducted prior to all injections. At no point did we inject any substances, as a needle was being advanced. No attempts were made at seeking any paresthesias. Safe injection practices and needle disposal techniques used. Medications properly checked for expiration  dates. SDV (single dose vial) medications used. Description of the Procedure: Protocol guidelines were followed. The procedure needle was introduced through the skin, ipsilateral to the reported pain, and advanced to the target area. Bone was contacted and the needle walked caudad, until the lamina was cleared. The epidural space was identified using "loss-of-resistance technique" with 2-3 ml of PF-NaCl (0.9% NSS), in a 5cc LOR glass syringe. Vitals:   12/04/18 9604 12/04/18 5409  12/04/18 0919 12/04/18 0929  BP: 100/80 95/74 (!) 100/55 (!) 101/59  Pulse:      Resp: 13 14 15 16   Temp:  (!) 97.3 F (36.3 C)  (!) 97.4 F (36.3 C)  TempSrc:      SpO2: 93% 99% 98% 98%  Weight:      Height:        Start Time:   hrs. End Time: 0857 hrs. Materials:  Needle(s) Type: Epidural needle Gauge: 17G Length: 3.5-in Medication(s): Please see orders for medications and dosing details.  Imaging Guidance (Spinal):          Type of Imaging Technique: Fluoroscopy Guidance (Spinal) Indication(s): Assistance in needle guidance and placement for procedures requiring needle placement in or near specific anatomical locations not easily accessible without such assistance. Exposure Time: Please see nurses notes. Contrast: None used. Fluoroscopic Guidance: I was personally present during the use of fluoroscopy. "Tunnel Vision Technique" used to obtain the best possible view of the target area. Parallax error corrected before commencing the procedure. "Direction-depth-direction" technique used to introduce the needle under continuous pulsed fluoroscopy. Once target was reached, antero-posterior, oblique, and lateral fluoroscopic projection used confirm needle placement in all planes. Images permanently stored in EMR. Interpretation: No contrast injected. I personally interpreted the imaging intraoperatively. Adequate needle placement confirmed in multiple planes. Permanent images saved into the patient's  record.  Antibiotic Prophylaxis:   Anti-infectives (From admission, onward)   None     Indication(s): None identified  Post-operative Assessment:  Post-procedure Vital Signs:  Pulse/HCG Rate: 8976 Temp: (!) 97.4 F (36.3 C) Resp: 16 BP: (!) 101/59 SpO2: 98 %  EBL: None  Complications: No immediate post-treatment complications observed by team, or reported by patient.  Note: The patient tolerated the entire procedure well. A repeat set of vitals were taken after the procedure and the patient was kept under observation following institutional policy, for this type of procedure. Post-procedural neurological assessment was performed, showing return to baseline, prior to discharge. The patient was provided with post-procedure discharge instructions, including a section on how to identify potential problems. Should any problems arise concerning this procedure, the patient was given instructions to immediately contact us, at any time, without hesitation. In any case, we plan to contact the patient by telephone for a follow-up status report regarding this interventional procedure.  Comments:  No additional relevant information.  Plan of Care  Orders:  Orders Placed This Encounter  Procedures  . Cervical Epidural Injection    Procedure: Cervical Epidural Steroid Injection/Block Purpose: Diagnostic Indication(s): Radiculitis and cervicalgia associater with cervical degenerative disc disease.    Scheduling Instructions:     Level(s): C7-T1     Laterality: Left-sided     Sedation: Patient's choice.     Timeframe: Today    Order Specific Question:   Where will this procedure be performed?    Answer:   [facility]    Comments:   by Dr. Dossie Arbour  . DG PAIN [facility] C-ARM 1-60 MIN NO REPORT    Intraoperative interpretation by procedural physician at [place].    Standing Status:   Standing    Number of Occurrences:   1    Order Specific Question:   Reason for exam:     Answer:   Assistance in needle guidance and placement for procedures requiring needle placement in or near specific anatomical locations not easily accessible without such assistance.  . Provider attestation of informed consent for procedure/surgical case  I, the ordering provider, attest that I have discussed with the patient the benefits, risks, side effects, alternatives, likelihood of achieving goals and potential problems during recovery for the procedure that I have provided informed consent.    Standing Status:   Standing    Number of Occurrences:   1  . Informed Consent Details: Transcribe to consent form and obtain patient signature    Standing Status:   Standing    Number of Occurrences:   1    Order Specific Question:   Procedure    Answer:   Cervical epidural steroid injection under fluoroscopic guidance. (See notes for level and laterality.)    Order Specific Question:   Surgeon    Answer:   Delora Gravatt A. Dossie Arbour, MD    Order Specific Question:   Indication/Reason    Answer:   Neck pain and/or upper extremity pain secondary to cervical radiculitis/radiculopathy.   Medications ordered for procedure: Meds ordered this encounter  Medications  . DISCONTD: iohexol (OMNIPAQUE) 180 MG/ML injection 10 mL    Must be Myelogram-compatible. If not available, you may substitute with a water-soluble, non-ionic, hypoallergenic, myelogram-compatible radiological contrast medium.  Marland Kitchen lidocaine (XYLOCAINE) 2 % (with pres) injection 400 mg  . lactated ringers infusion 1,000 mL  . midazolam (VERSED) 5 MG/5ML injection 1-2 mg    Make sure Flumazenil is available in the pyxis when using this medication. If oversedation occurs, administer 0.2 mg IV over 15 sec. If after 45 sec no response, administer 0.2 mg again over 1 min; may repeat at 1 min intervals; not to exceed 4 doses (1 mg)  . fentaNYL (SUBLIMAZE) injection 25-50 mcg    Make sure Narcan is available in the pyxis when using this  medication. In the event of respiratory depression (RR< 8/min): Titrate NARCAN (naloxone) in increments of 0.1 to 0.2 mg IV at 2-3 minute intervals, until desired degree of reversal.  . sodium chloride flush (NS) 0.9 % injection 1 mL  . ropivacaine (PF) 2 mg/mL (0.2%) (NAROPIN) injection 1 mL  . dexamethasone (DECADRON) injection 10 mg   Medications administered: We administered lidocaine, midazolam, fentaNYL, sodium chloride flush, ropivacaine (PF) 2 mg/mL (0.2%), and dexamethasone.  See the medical record for exact dosing, route, and time of administration.  Disposition: Discharge home  Discharge Date & Time: 12/04/2018; 0930 hrs.   Follow-up plan:   Return in about 2 weeks (around 12/18/2018) for (VV), E/M (PP).      Considering:   Diagnostic left-sided CESI #2  Diagnostic midline LESI  Diagnostic bilateral lumbar facet nerve block #2  Possible bilateral lumbar facet RFA  Diagnostic bilateral thoracic facet nerve block  Possible bilateral thoracic RFA  Diagnostic left intra-articular knee injection    PRN Procedures:   None at this time    Recent Visits Date Type Provider Dept  11/26/18 Office Visit Milinda Pointer, MD [facility]  11/13/18 Procedure visit Milinda Pointer, MD [facility]  Showing recent visits within past 90 days and meeting all other requirements   Today's Visits Date Type Provider Dept  12/04/18 Procedure visit Milinda Pointer, MD [facility]  Showing today's visits and meeting all other requirements   Future Appointments Date Type Provider Dept  12/18/18 Appointment Milinda Pointer, MD [facility]  Showing future appointments within next 90 days and meeting all other requirements   Primary Care Physician: Tracie Harrier, MD Location: [facility] Note by: Gaspar Cola, MD Date: 12/04/2018; Time: 10:01 AM  Disclaimer:  Medicine is not an Chief Strategy Officer. The only  guarantee in medicine is that nothing is guaranteed. It is important to note that the decision to proceed with this intervention was based on the information collected from the patient. The Data and conclusions were drawn from the patient's questionnaire, the interview, and the physical examination. Because the information was provided in large part by the patient, it cannot be guaranteed that it has not been purposely or unconsciously manipulated. Every effort has been made to obtain as much relevant data as possible for this evaluation. It is important to note that the conclusions that lead to this procedure are derived in large part from the available data. Always take into account that the treatment will also be dependent on availability of resources and existing treatment guidelines, considered by other Pain Management Practitioners as being common knowledge and practice, at the time of the intervention. For Medico-Legal purposes, it is also important to point out that variation in procedural techniques and pharmacological choices are the acceptable norm. The indications, contraindications, technique, and results of the above procedure should only be interpreted and judged by a Board-Certified Interventional Pain Specialist with extensive familiarity and expertise in the same exact procedure and technique.

## 2018-12-05 ENCOUNTER — Telehealth: Payer: Self-pay

## 2018-12-05 NOTE — Telephone Encounter (Signed)
Post procedure phone call.  Patient states she is doing well.  

## 2018-12-08 ENCOUNTER — Telehealth: Payer: Self-pay | Admitting: Pain Medicine

## 2018-12-08 NOTE — Telephone Encounter (Signed)
Attempted to call patient.  Mailbox is full and I was unable to leave a message.

## 2018-12-08 NOTE — Telephone Encounter (Signed)
Patient lvmail stating she is having problems since procedure, something is wrong she is falling and stumbling, has a knot at the base of her neck, please call asap.

## 2018-12-09 ENCOUNTER — Other Ambulatory Visit: Payer: Self-pay | Admitting: Pain Medicine

## 2018-12-09 DIAGNOSIS — G959 Disease of spinal cord, unspecified: Secondary | ICD-10-CM

## 2018-12-09 DIAGNOSIS — M542 Cervicalgia: Secondary | ICD-10-CM

## 2018-12-09 NOTE — Progress Notes (Signed)
The patient called indicating having problems with ambulation, lower and upper extremity weakness, neck pain and shoulder pain, and falling after having had a left-sided cervical epidural steroid injection on 12/04/2018.  She also complains of a mass in the neck about the size of her fist, according to her.    The patient has a prior cervical MRI done on 05/17/2017 which reads:  CLINICAL DATA:  58 year old female with chronic cervical neck pain. Recent pain radiation down the left arm with numbness from the elbow distally. Chronic lumbar back pain progressive for 1 month. Frequent falls. Painful sitting and standing straight.  EXAM: MRI CERVICAL AND LUMBAR SPINE WITHOUT CONTRAST  TECHNIQUE: Multiplanar and multiecho pulse sequences of the cervical spine, to include the craniocervical junction and cervicothoracic junction, and lumbar spine, were obtained without intravenous contrast.  COMPARISON:  Brain MRI 07/27/2015. Cervical spine MRI 10/11/2009. Lumbar MRI 03/15/2016. CT Abdomen and Pelvis 12/22/2013.  FINDINGS: MRI CERVICAL SPINE FINDINGS  Alignment: Chronic straightening of cervical lordosis. Mild anterolisthesis of C3 on C4 appears stable but similar mild degenerative appearing anterolisthesis of C4 on C5 has developed since 2011. Subtle anterolisthesis of C5 on C6.  Vertebrae: There is mild degenerative appearing marrow edema in the C5 lamina posteriorly in the midline (series 5, image 7). This is new compared to 2011. Background bone marrow signal is normal. Chronic but increased degenerative endplate irregularity inferiorly at C6. No other marrow edema or acute osseous abnormality.  Cord: Spinal cord signal is within normal limits at all visualized levels.  Posterior Fossa, vertebral arteries, paraspinal tissues: Negative visualized posterior fossa. Cervicomedullary junction is within normal limits. Preserved major vascular flow voids in the neck. Negative neck soft  tissues.  Disc levels:  C2-C3: Moderate to severe facet hypertrophy greater on the right has developed since 2011. Right foraminal disc bulge and endplate spurring. No spinal stenosis, but moderate right C3 neural foraminal stenosis is new from the prior MRI.  C3-C4: Moderate to severe facet hypertrophy greater on the right has progressed since 2011. Mild foraminal disc bulging and endplate spurring greater on the right. Mild left and moderate to severe right C4 foraminal stenosis have increased since 2011.  C4-C5: Chronic left side facet hypertrophy, but moderate to severe facet degeneration has progressed since 2011. Left greater than right foraminal disc bulging and endplate spurring. Mild left ligament flavum hypertrophy. No spinal stenosis. Severe left and mild to moderate right C5 foraminal stenosis have progressed.  C5-C6: Mild facet hypertrophy has increased on the right. Mild circumferential disc bulge with foraminal disc and endplate spurring. No spinal stenosis. Increased borderline to mild left and moderate right C6 foraminal stenosis.  C6-C7: Disc space loss since 2011. Circumferential disc bulge and endplate spurring with broad-based posterior and biforaminal component. Mild ligament flavum hypertrophy. Mild spinal stenosis. Borderline to mild spinal cord mass effect. Moderate to severe bilateral C7 neural foraminal stenosis. This level has progressed since 2011.  C7-T1: Progressed moderate facet hypertrophy greater on the left. Mild foraminal endplate spurring. Increased mild left C8 foraminal stenosis.  No upper thoracic spinal or foraminal stenosis.  IMPRESSION: CERVICAL SPINE:  1. Progressed cervical spine degeneration since 2011. Multilevel mild spondylolisthesis associated with increased moderate to severe facet hypertrophy at multiple levels. Associated degenerative appearing mild marrow edema in the right C5 lamina. Subsequent new or increased neural foraminal  stenosis which is moderate or severe at the right C3, right C4, left C5, right C6 and bilateral C7 nerve levels. 2. Multifactorial mild cervical spinal stenosis  at C6-C7 with up to mild spinal cord mass effect, and the bilateral C7 foraminal stenosis. No spinal cord signal abnormality.  PLAN: Today I will go ahead and order a CBC and differential as well as an MRI of the cervical spine.

## 2018-12-09 NOTE — Telephone Encounter (Signed)
Called patient to follow up on phone call.  Patient states that she has a know the size of a half fist on her neck where her neck meets her shoulders.  States that she keeps falling and stumbling.  She has already fallen twice today.  Denies fever.  States she has pain when taking a deep breath.  Had Left cervical epidural on 7-2.  States pain went away for the first day and came back later that night.  Denies any relief since then.  Complains that she cant hold on to anything in her left hand and her shoulders hurt too.  Discussed with Dr Dossie Arbour and orders were placed for MRI and labwork.  Notified patient of above orders and to go to ED for any increase in symptoms.  Patient states she cant get labwork until the morning.  States understanding.

## 2018-12-17 ENCOUNTER — Encounter: Payer: Self-pay | Admitting: Pain Medicine

## 2018-12-17 NOTE — Progress Notes (Signed)
Pain Management Virtual Encounter Note - Virtual Visit via Telephone Telehealth (real-time audio visits between healthcare provider and patient).   Patient's Phone No. & Preferred Pharmacy:  [phone] (home); [phone] (mobile); (Preferred) [phone] No e-mail address on record  [place], [address] [place] [address] Phone: [phone] Fax: [phone]    Pre-screening note:  Our staff contacted Ms. Burr and offered her an "in person", "face-to-face" appointment versus a telephone encounter. She indicated preferring the telephone encounter, at this time.   Reason for Virtual Visit: COVID-19*   Social distancing based on CDC and AMA recommendations.   I contacted [name] on 12/18/2018 via telephone.      I clearly identified myself as Gaspar Cola, MD. I verified that I was speaking with the correct person using two identifiers (Name: Kamilya Wakeman, and date of birth: [birth year]).  Advanced Informed Consent I sought verbal advanced consent from [name] for virtual visit interactions. I informed Ms. Mills of possible security and privacy concerns, risks, and limitations associated with providing "not-in-person" medical evaluation and management services. I also informed Ms. Dilworth of the availability of "in-person" appointments. Finally, I informed her that there would be a charge for the virtual visit and that she could be  personally, fully or partially, financially responsible for it. Ms. Vogelgesang expressed understanding and agreed to proceed.   Historic Elements   Ms. Wilma Michaelson is a 58 y.o. year old, female patient evaluated today after her last encounter by our practice on 12/08/2018. Ms. Wee  has a past medical history of Abnormal gait, Allergy, Anxiety, Arthritis, Back pain, Bipolar disorder ([place]), CAD (coronary artery disease), Chronic kidney disease, Collagen vascular disease ([place]),  Colon polyp, COPD (chronic obstructive pulmonary disease) ([place]), Falls, Fibromyalgia, GERD (gastroesophageal reflux disease), Hyperlipidemia, Hypertension, IBS (irritable bowel syndrome), Kidney disease, Migraine, Numbness and tingling, Renal insufficiency, Spinal stenosis, Static encephalopathy, Thyroid nodule (07/2017), and TIA (transient ischemic attack). She also  has a past surgical history that includes Tubal ligation; Nasal sinus surgery; Colonoscopy with propofol (N/A, 04/01/2017); Esophagogastroduodenoscopy (egd) with propofol (N/A, 04/01/2017); and Mouth surgery. Ms. Fielder has a current medication list which includes the following prescription(s): albuterol, bupropion, diclofenac sodium, tylenol pm extra strength, donepezil, doxepin, formoterol, ipratropium, losartan-hydrochlorothiazide, meloxicam, mirtazapine, omeprazole, pantoprazole, pregabalin, quetiapine, ramelteon, sertraline, spiriva handihaler, tizanidine, and anoro ellipta. She  reports that she has been smoking e-cigarettes. She has a 6.25 pack-year smoking history. She has never used smokeless tobacco. She reports current drug use. Drug: Cocaine. She reports that she does not drink alcohol. Ms. Neighbors is allergic to contrast media [iodinated diagnostic agents]; shellfish allergy; tetanus toxoid adsorbed; ace inhibitors; codeine; nitrofurantoin monohyd macro; and red dye.   HPI  Today, she is being contacted for a post-procedure assessment.  The patient indicates that this block was different from the other one in the sense that after the block he she had more pain than before for a while.  She also had a bulge in the neck that with appropriate icing and heat went away after a couple days.  She indicates that she did not have any pain for the duration of the local anesthetic, but after about a day and a half everything went back to [place] before.  She feels that this cervical epidural steroid injection was not as effective as the benefit that  she experienced when she had her lumbar facets.  Furthermore, she also indicates that the  pain that she was experiencing between the shoulder blades did not really go away with that cervical epidural suggesting that is not coming from that area.  Since that is the case, it is very likely that that upper back pain, since it is not radiating towards the front it is not a thoracic radiculitis.  Based on her description it would seem to be more of a problem with her thoracic facets.  I will be bringing the patient back for a diagnostic upper thoracic facet block, bilaterally to determine if this is the origin of her pain.  The patient also volunteered that her low back pain is beginning to come back and therefore we will need to reconsider later on to do her second diagnostic lumbar facet block which was very effective in controlling her pain and allowing her to not use her cane or walker to ambulate.  Today we also went over some of her medications and it would seem that she has been taking this Zanaflex and [place] for quite some time and she thinks that she may be intolerant to both of them.  I have recommended that she talk to the prescribing physicians about the possibility of doing a "drug holiday" to both of these medications.  I warned her not to stop the medication cold [place] and to consider tapering them down before stopping them.  I also recommended that she do that separately so that she does not take a drug holiday to both medications the same time.  She understood and accepted and she indicated that she would be contacting the prescribing physician to get their permission to do this.  Post-Procedure Evaluation  Procedure: Diagnostic left-sided cervical epidural steroid injection #1 under fluoroscopic guidance and IV sedation Pre-procedure pain level:  6/10 Post-procedure: 4/10 (< 50% relief)  Sedation: Sedation provided.  Effectiveness during initial hour after procedure(Ultra-Short Term  Relief): 100 %   Local anesthetic used: Long-acting (4-6 hours) Effectiveness: Defined as any analgesic benefit obtained secondary to the administration of local anesthetics. This carries significant diagnostic value as to the etiological location, or anatomical origin, of the pain. Duration of benefit is expected to coincide with the duration of the local anesthetic used.  Effectiveness during initial 4-6 hours after procedure(Short-Term Relief): 100 %   Long-term benefit: Defined as any relief past the pharmacologic duration of the local anesthetics.  Effectiveness past the initial 6 hours after procedure(Long-Term Relief): 100 % (x 1.5 days)   Current benefits: Defined as benefit that persist at this time.   Analgesia:  Back to baseline Function: Back to baseline ROM: Back to baseline  Pharmacotherapy Assessment  Analgesic:  None provided by our practice. NO OPIOIDS: UDS (08/07/17) (+) for Unreported Benzoylecgonine, a metabolite of cocaine; its presence    indicates use of this drug.  Source is most commonly illicit.    Monitoring: Pharmacotherapy: No side-effects or adverse reactions reported. [place] PMP: PDMP reviewed during this encounter.       Compliance: No problems identified. Effectiveness: Clinically acceptable. Plan: Refer to "POC".  Pertinent Labs   SAFETY SCREENING Profile Lab Results  Component Value Date   SARSCOV2NAA NOT DETECTED 12/01/2018   COVIDSOURCE NASOPHARYNGEAL 12/01/2018   STAPHAUREUS NEGATIVE 07/10/2017   MRSAPCR NEGATIVE 07/10/2017   Renal Function Lab Results  Component Value Date   BUN 22 03/17/2018   CREATININE 1.35 (H) 03/17/2018   BCR 16 03/17/2018   GFRAA 51 (L) 03/17/2018   GFRNONAA 44 (L) 03/17/2018   Hepatic Function Lab Results  Component Value Date   AST 13 03/17/2018   ALT 15 12/24/2017   ALBUMIN 4.4 03/17/2018   UDS Summary  Date Value Ref Range Status  03/17/2018 FINAL  Final    Comment:     ==================================================================== TOXASSURE COMP DRUG ANALYSIS,UR ==================================================================== Test                             Result       Flag       Units Drug Present and Declared for Prescription Verification   Tramadol                       1825         EXPECTED   ng/mg creat   O-Desmethyltramadol            929          EXPECTED   ng/mg creat   N-Desmethyltramadol            1352         EXPECTED   ng/mg creat    Source of tramadol is a prescription medication.    O-desmethyltramadol and N-desmethyltramadol are expected    metabolites of tramadol.   Pregabalin                     PRESENT      EXPECTED   Tizanidine                     PRESENT      EXPECTED   Bupropion                      PRESENT      EXPECTED   Hydroxybupropion               PRESENT      EXPECTED    Hydroxybupropion is an expected metabolite of bupropion.   Mirtazapine                    PRESENT      EXPECTED   Quetiapine                     PRESENT      EXPECTED   Salicylate                     PRESENT      EXPECTED Drug Present not Declared for Prescription Verification   Benzoylecgonine                494          UNEXPECTED ng/mg creat    Benzoylecgonine is a metabolite of cocaine; its presence    indicates use of this drug.  Source is most commonly illicit, but    cocaine is present in some topical anesthetic solutions.   Acetaminophen                  PRESENT      UNEXPECTED   Diphenhydramine                PRESENT      UNEXPECTED Drug Absent but Declared for Prescription Verification   Alprazolam                     Not Detected UNEXPECTED ng/mg creat   Sertraline  Not Detected UNEXPECTED   Diclofenac                     Not Detected UNEXPECTED    Topical diclofenac, as indicated in the declared medication list,    is not always detected even when used as directed.   Ibuprofen                      Not Detected  UNEXPECTED    Ibuprofen, as indicated in the declared medication list, is not    always detected even when used as directed. ==================================================================== Test                      Result    Flag   Units      Ref Range   Creatinine              159              mg/dL      >=20 ==================================================================== Declared Medications:  The flagging and interpretation on this report are based on the  following declared medications.  Unexpected results may arise from  inaccuracies in the declared medications.  **Note: The testing scope of this panel includes these medications:  Alprazolam (Xanax)  Bupropion (Wellbutrin)  Mirtazapine (Remeron)  Pregabalin (Lyrica)  Quetiapine (Seroquel)  Sertraline (Zoloft)  Tramadol (Ultram)  **Note: The testing scope of this panel does not include small to  moderate amounts of these reported medications:  Aspirin (Aspirin 81)  Ibuprofen  Tizanidine (Zanaflex)  Topical Diclofenac  **Note: The testing scope of this panel does not include following  reported medications:  Albuterol  Donepezil (Aricept)  Formoterol (Perforomist)  Hydrochlorothiazide (Hyzaar)  Ipratropium  Losartan (Hyzaar)  Omeprazole (Prilosec)  Pantoprazole (Protonix)  Ramelteon (Rozerem)  Tiotropium (Spiriva)  Vitamin D ==================================================================== For clinical consultation, please call [phone]. ====================================================================    Note: Above Lab results reviewed.  Recent imaging  DG PAIN CLINIC C-ARM 1-60 MIN NO REPORT Fluoro was used, but no Radiologist interpretation will be provided.  Please refer to "NOTES" tab for provider progress note.     Assessment  The primary encounter diagnosis was Cervical spondylosis with myelopathy. Diagnoses of Cervical central spinal stenosis, Cervical foraminal stenosis, DDD  (degenerative disc disease), cervical, Abnormal MRI, lumbar spine, and Thoracic facet syndrome were also pertinent to this visit.  Plan of Care  I have discontinued [place]bbie [place]. Masoud's Vitamin D, ibuprofen, aspirin EC, traMADol, ALPRAZolam, traMADol, Melatonin, and aspirin. I am also having her maintain her losartan-hydrochlorothiazide, diclofenac sodium, ramelteon, pregabalin, tiZANidine, donepezil, omeprazole, Spiriva HandiHaler, buPROPion, mirtazapine, sertraline, QUEtiapine, pantoprazole, albuterol, formoterol, ipratropium, Tylenol PM Extra Strength, doxepin, meloxicam, and Anoro Ellipta.  Pharmacotherapy (Medications Ordered): No orders of the defined types were placed in this encounter.  Orders:  Orders Placed This Encounter  Procedures   THORACIC FACET BLOCK    Standing Status:   Future    Standing Expiration Date:   01/17/2019    Scheduling Instructions:     Thoracic Medial Branch Block     Side: Bilateral     Sedation: With Sedation.     Timeframe: ASAA    Order Specific Question:   Where will this procedure be performed?    Answer:   [facility]   Follow-up plan:   Return for Procedure (w/ sedation): (B) T-FCT Blk #1.      Considering:   NOTE: SHELLFISH Allergy. Diagnostic left-sided CESI #2  Diagnostic midline LESI  Diagnostic bilateral lumbar facet nerve block #2  Possible bilateral lumbar facet RFA  Diagnostic bilateral thoracic facet nerve block  Possible bilateral thoracic RFA  Diagnostic left intra-articular knee injection    PRN Procedures:   Diagnostic/therapeutic left-sided CESI #2 (100/100/100) Diagnostic/therapeutic bilateral lumbar facet nerve block #2 (100/100/75/>50)     Recent Visits Date Type Provider Dept  12/04/18 Procedure visit Milinda Pointer, MD [facility]  11/26/18 Office Visit Milinda Pointer, MD [facility]  11/13/18 Procedure visit Milinda Pointer, MD [facility]  Showing recent visits  within past 90 days and meeting all other requirements   Today's Visits Date Type Provider Dept  12/18/18 Office Visit Milinda Pointer, MD [facility]  Showing today's visits and meeting all other requirements   Future Appointments No visits were found meeting these conditions.  Showing future appointments within next 90 days and meeting all other requirements   I discussed the assessment and treatment plan with the patient. The patient was provided an opportunity to ask questions and all were answered. The patient agreed with the plan and demonstrated an understanding of the instructions.  Patient advised to call back or seek an in-person evaluation if the symptoms or condition worsens.  Total duration of non-face-to-face encounter: 25 minutes.  Note by: Gaspar Cola, MD Date: 12/18/2018; Time: 10:10 AM  Note: This dictation was prepared with Dragon dictation. Any transcriptional errors that may result from this process are unintentional.  Disclaimer:  * Given the special circumstances of the COVID-19 pandemic, the federal government has announced that the Office for Civil Rights (OCR) will exercise its enforcement discretion and will not impose penalties on physicians using telehealth in the event of noncompliance with regulatory requirements under the [place] (HIPAA) in connection with the good faith provision of telehealth during the HWKGS-81 national public health emergency. ([place]uda)

## 2018-12-18 ENCOUNTER — Other Ambulatory Visit: Payer: Self-pay

## 2018-12-18 ENCOUNTER — Ambulatory Visit: Payer: Medicare Other | Attending: Pain Medicine | Admitting: Pain Medicine

## 2018-12-18 DIAGNOSIS — R937 Abnormal findings on diagnostic imaging of other parts of musculoskeletal system: Secondary | ICD-10-CM | POA: Diagnosis not present

## 2018-12-18 DIAGNOSIS — M4802 Spinal stenosis, cervical region: Secondary | ICD-10-CM | POA: Diagnosis not present

## 2018-12-18 DIAGNOSIS — M47814 Spondylosis without myelopathy or radiculopathy, thoracic region: Secondary | ICD-10-CM

## 2018-12-18 DIAGNOSIS — M4712 Other spondylosis with myelopathy, cervical region: Secondary | ICD-10-CM | POA: Diagnosis not present

## 2018-12-18 DIAGNOSIS — M5030 Other cervical disc degeneration, unspecified cervical region: Secondary | ICD-10-CM

## 2018-12-18 DIAGNOSIS — M47894 Other spondylosis, thoracic region: Secondary | ICD-10-CM | POA: Insufficient documentation

## 2018-12-18 NOTE — Patient Instructions (Signed)

## 2019-01-02 DIAGNOSIS — F0390 Unspecified dementia without behavioral disturbance: Secondary | ICD-10-CM | POA: Insufficient documentation

## 2019-01-02 DIAGNOSIS — M329 Systemic lupus erythematosus, unspecified: Secondary | ICD-10-CM | POA: Insufficient documentation

## 2019-01-02 DIAGNOSIS — E538 Deficiency of other specified B group vitamins: Secondary | ICD-10-CM | POA: Insufficient documentation

## 2019-01-08 ENCOUNTER — Ambulatory Visit
Admission: RE | Admit: 2019-01-08 | Discharge: 2019-01-08 | Disposition: A | Discharge status: Home / Self Care | Payer: Medicare Other | Source: Ambulatory Visit | Attending: Pain Medicine | Admitting: Pain Medicine

## 2019-01-08 ENCOUNTER — Encounter: Payer: Self-pay | Admitting: Pain Medicine

## 2019-01-08 ENCOUNTER — Ambulatory Visit (HOSPITAL_BASED_OUTPATIENT_CLINIC_OR_DEPARTMENT_OTHER): Payer: Medicare Other | Admitting: Pain Medicine

## 2019-01-08 ENCOUNTER — Other Ambulatory Visit: Payer: Self-pay

## 2019-01-08 VITALS — BP 115/87 | HR 89 | Temp 97.0°F | Resp 14 | Ht 68.0 in | Wt 220.0 lb

## 2019-01-08 DIAGNOSIS — M47814 Spondylosis without myelopathy or radiculopathy, thoracic region: Secondary | ICD-10-CM | POA: Diagnosis not present

## 2019-01-08 DIAGNOSIS — G8929 Other chronic pain: Secondary | ICD-10-CM

## 2019-01-08 DIAGNOSIS — M549 Dorsalgia, unspecified: Secondary | ICD-10-CM | POA: Insufficient documentation

## 2019-01-08 DIAGNOSIS — M47894 Other spondylosis, thoracic region: Secondary | ICD-10-CM

## 2019-01-08 MED ORDER — LACTATED RINGERS IV SOLN
1000.0000 mL | Freq: Once | INTRAVENOUS | Status: AC
  Administered 2019-01-08: 1000 mL via INTRAVENOUS

## 2019-01-08 MED ORDER — FENTANYL CITRATE (PF) 100 MCG/2ML IJ SOLN
25.0000 ug | INTRAMUSCULAR | Status: DC | PRN
  Administered 2019-01-08: 50 ug via INTRAVENOUS
  Filled 2019-01-08: qty 2

## 2019-01-08 MED ORDER — LIDOCAINE HCL 2 % IJ SOLN
20.0000 mL | Freq: Once | INTRAMUSCULAR | Status: AC
  Administered 2019-01-08: 400 mg
  Filled 2019-01-08: qty 20

## 2019-01-08 MED ORDER — DEXAMETHASONE SODIUM PHOSPHATE 10 MG/ML IJ SOLN
20.0000 mg | Freq: Once | INTRAMUSCULAR | Status: AC
  Administered 2019-01-08: 09:00:00 20 mg
  Filled 2019-01-08: qty 2

## 2019-01-08 MED ORDER — MIDAZOLAM HCL 5 MG/5ML IJ SOLN
1.0000 mg | INTRAMUSCULAR | Status: DC | PRN
  Administered 2019-01-08: 2 mg via INTRAVENOUS
  Filled 2019-01-08: qty 5

## 2019-01-08 MED ORDER — ROPIVACAINE HCL 2 MG/ML IJ SOLN
INTRAMUSCULAR | Status: AC
  Filled 2019-01-08: qty 20

## 2019-01-08 MED ORDER — ROPIVACAINE HCL 2 MG/ML IJ SOLN
18.0000 mL | Freq: Once | INTRAMUSCULAR | Status: AC
  Administered 2019-01-08: 18 mL via PERINEURAL

## 2019-01-08 NOTE — Patient Instructions (Signed)

## 2019-01-08 NOTE — Progress Notes (Signed)
Patient's Name: Samantha Macias  MRN: [id number]  Referring Provider: Tracie Harrier, MD  DOB: 10-10-1960  PCP: Tracie Harrier, MD  DOS: 01/08/2019  Note by: Gaspar Cola, MD  Service setting: Ambulatory outpatient  Specialty: Interventional Pain Management  Patient type: Established  Location: [facility] (AMB) Pain Management Facility  Visit type: Interventional Procedure   Primary Reason for Visit: Interventional Pain Management Treatment. CC: Back Pain (low and mid)  Procedure:          Anesthesia, Analgesia, Anxiolysis:  Type: Therapeutic Medial Branch Facet Block  #1  Region:Thoracic Level: T7, T8, T9, & T10  Medial Branch Level(s) Laterality: Bilateral  Type: Moderate (Conscious) Sedation combined with Local Anesthesia Indication(s): Analgesia and Anxiety Route: Intravenous (IV) IV Access: Secured Sedation: Meaningful verbal contact was maintained at all times during the procedure  Local Anesthetic: Lidocaine 1-2%  Position: Prone   Indications: 1. Thoracic facet syndrome   2. Spondylosis without myelopathy or radiculopathy, thoracic region   3. Chronic upper back pain [facility] Area of Pain) (Bilateral) (R>L)    Pain Score: Pre-procedure: 7 /10 Post-procedure: 0-No pain/10   Pre-op Assessment:  Samantha Macias is a 58 y.o. (year old), female patient, seen today for interventional treatment. She  has a past surgical history that includes Tubal ligation; Nasal sinus surgery; Colonoscopy with propofol (N/A, 04/01/2017); Esophagogastroduodenoscopy (egd) with propofol (N/A, 04/01/2017); and Mouth surgery. Samantha Macias has a current medication list which includes the following prescription(s): albuterol, bupropion, diclofenac sodium, tylenol pm extra strength, donepezil, doxepin, formoterol, ipratropium, losartan-hydrochlorothiazide, meloxicam, mirtazapine, omeprazole, pantoprazole, pregabalin, quetiapine, ramelteon, sertraline, spiriva handihaler, tizanidine, and anoro ellipta,  and the following Facility-Administered Medications: fentanyl and midazolam. Her primarily concern today is the Back Pain (low and mid)  Initial Vital Signs:  Pulse/HCG Rate: 89ECG Heart Rate: 88 Temp: 98 F (36.7 C) Resp: 18 BP: (!) 136/98 SpO2: 96 %  BMI: Estimated body mass index is 33.45 kg/m as calculated from the following:   Height as of this encounter: 5\' 8"  (1.727 m).   Weight as of this encounter: 220 lb (99.8 kg).  Risk Assessment: Allergies: Reviewed. She is allergic to contrast media [iodinated diagnostic agents]; shellfish allergy; tetanus toxoid adsorbed; ace inhibitors; codeine; nitrofurantoin monohyd macro; and red dye.  Allergy Precautions: None required Coagulopathies: Reviewed. None identified.  Blood-thinner therapy: None at this time Active Infection(s): Reviewed. None identified. Samantha Macias is afebrile  Site Confirmation: Samantha Macias was asked to confirm the procedure and laterality before marking the site Procedure checklist: Completed Consent: Before the procedure and under the influence of no sedative(s), amnesic(s), or anxiolytics, the patient was informed of the treatment options, risks and possible complications. To fulfill our ethical and legal obligations, as recommended by the American Medical Association's Code of Ethics, I have informed the patient of my clinical impression; the nature and purpose of the treatment or procedure; the risks, benefits, and possible complications of the intervention; the alternatives, including doing nothing; the risk(s) and benefit(s) of the alternative treatment(s) or procedure(s); and the risk(s) and benefit(s) of doing nothing. The patient was provided information about the general risks and possible complications associated with the procedure. These may include, but are not limited to: failure to achieve desired goals, infection, bleeding, organ or nerve damage, allergic reactions, paralysis, and death. In addition, the  patient was informed of those risks and complications associated to Spine-related procedures, such as failure to decrease pain; infection (i.e.: Meningitis, epidural or intraspinal abscess); bleeding (i.e.: epidural hematoma, subarachnoid hemorrhage, or  any other type of intraspinal or peri-dural bleeding); organ or nerve damage (i.e.: Any type of peripheral nerve, nerve root, or spinal cord injury) with subsequent damage to sensory, motor, and/or autonomic systems, resulting in permanent pain, numbness, and/or weakness of one or several areas of the body; allergic reactions; (i.e.: anaphylactic reaction); and/or death. Furthermore, the patient was informed of those risks and complications associated with the medications. These include, but are not limited to: allergic reactions (i.e.: anaphylactic or anaphylactoid reaction(s)); adrenal axis suppression; blood sugar elevation that in diabetics may result in ketoacidosis or comma; water retention that in patients with history of congestive heart failure may result in shortness of breath, pulmonary edema, and decompensation with resultant heart failure; weight gain; swelling or edema; medication-induced neural toxicity; particulate matter embolism and blood vessel occlusion with resultant organ, and/or nervous system infarction; and/or aseptic necrosis of one or more joints. Finally, the patient was informed that Medicine is not an exact science; therefore, there is also the possibility of unforeseen or unpredictable risks and/or possible complications that may result in a catastrophic outcome. The patient indicated having understood very clearly. We have given the patient no guarantees and we have made no promises. Enough time was given to the patient to ask questions, all of which were answered to the patient's satisfaction. Samantha Macias has indicated that she wanted to continue with the procedure. Attestation: I, the ordering provider, attest that I have discussed  with the patient the benefits, risks, side-effects, alternatives, likelihood of achieving goals, and potential problems during recovery for the procedure that I have provided informed consent. Date  Time: 01/08/2019  7:57 AM  Pre-Procedure Preparation:  Monitoring: As per clinic protocol. Respiration, ETCO2, SpO2, BP, heart rate and rhythm monitor placed and checked for adequate function Safety Precautions: Patient was assessed for positional comfort and pressure points before starting the procedure. Time-out: I initiated and conducted the "Time-out" before starting the procedure, as per protocol. The patient was asked to participate by confirming the accuracy of the "Time Out" information. Verification of the correct person, site, and procedure were performed and confirmed by me, the nursing staff, and the patient. "Time-out" conducted as per Joint Commission's Universal Protocol (UP.01.01.01). Time: 0840  Description of Procedure:          Target Area: The target area is a superior and lateral portion of the thoracic transverse processes more distal to the joint itself then and the lumbar region. Approach: Paraspinal approach. Area Prepped: Entire Posterior Thoracic Region Prepping solution: DuraPrep (Iodine Povacrylex [0.7% available iodine] and Isopropyl Alcohol, 74% w/w) Safety Precautions: Aspiration looking for blood return was conducted prior to all injections. At no point did we inject any substances, as a needle was being advanced. No attempts were made at seeking any paresthesias. Safe injection practices and needle disposal techniques used. Medications properly checked for expiration dates. SDV (single dose vial) medications used. Description of the Procedure: Protocol guidelines were followed. The patient was placed in position over the fluoroscopy table. The target area was identified and the area prepped in the usual manner. Skin & deeper tissues infiltrated with local anesthetic.  Appropriate amount of time allowed to pass for local anesthetics to take effect. The procedure needles were then advanced to the target area. Proper needle placement secured. Negative aspiration confirmed. Solution injected in intermittent fashion, asking for systemic symptoms every 0.5cc of injectate. The needles were then removed and the area cleansed, making sure to leave some of the prepping solution back to  take advantage of its long term bactericidal properties. Vitals:   01/08/19 0855 01/08/19 0905 01/08/19 0915 01/08/19 0926  BP: (!) 128/92 116/82 113/89 115/87  Pulse:      Resp: 11 18 14 14   Temp:  (!) 97.5 F (36.4 C)  (!) 97 F (36.1 C)  SpO2: 96% 97% 98% 98%  Weight:      Height:        Start Time: 0840 hrs. End Time: 0855 hrs. Imaging Guidance (Spinal):          Type of Imaging Technique: Fluoroscopy Guidance (Spinal) Indication(s): Assistance in needle guidance and placement for procedures requiring needle placement in or near specific anatomical locations not easily accessible without such assistance. Exposure Time: Please see nurses notes. Contrast: None used. Fluoroscopic Guidance: I was personally present during the use of fluoroscopy. "Tunnel Vision Technique" used to obtain the best possible view of the target area. Parallax error corrected before commencing the procedure. "Direction-depth-direction" technique used to introduce the needle under continuous pulsed fluoroscopy. Once target was reached, antero-posterior, oblique, and lateral fluoroscopic projection used confirm needle placement in all planes. Images permanently stored in EMR. Interpretation: No contrast injected. I personally interpreted the imaging intraoperatively. Adequate needle placement confirmed in multiple planes. Permanent images saved into the patient's record.  Antibiotic Prophylaxis:   Anti-infectives (From admission, onward)   None     Indication(s): None identified  Post-operative  Assessment:  Post-procedure Vital Signs:  Pulse/HCG Rate: 8986 Temp: (!) 97 F (36.1 C) Resp: 14 BP: 115/87 SpO2: 98 %  EBL: None  Complications: No immediate post-treatment complications observed by team, or reported by patient.  Note: The patient tolerated the entire procedure well. A repeat set of vitals were taken after the procedure and the patient was kept under observation following institutional policy, for this type of procedure. Post-procedural neurological assessment was performed, showing return to baseline, prior to discharge. The patient was provided with post-procedure discharge instructions, including a section on how to identify potential problems. Should any problems arise concerning this procedure, the patient was given instructions to immediately contact us, at any time, without hesitation. In any case, we plan to contact the patient by telephone for a follow-up status report regarding this interventional procedure.  Comments:  No additional relevant information.  Plan of Care  Orders:  Orders Placed This Encounter  Procedures  . THORACIC FACET BLOCK    Scheduling Instructions:     Thoracic Medial Branch Block     Side: Bilateral     Sedation: With Sedation.     Timeframe: Today    Order Specific Question:   Where will this procedure be performed?    Answer:   [facility]  . DG PAIN [facility] C-ARM 1-60 MIN NO REPORT    Intraoperative interpretation by procedural physician at [place].    Standing Status:   Standing    Number of Occurrences:   1    Order Specific Question:   Reason for exam:    Answer:   Assistance in needle guidance and placement for procedures requiring needle placement in or near specific anatomical locations not easily accessible without such assistance.  . Provider attestation of informed consent for procedure/surgical case    I, the ordering provider, attest that I have discussed with the patient the benefits, risks,  side effects, alternatives, likelihood of achieving goals and potential problems during recovery for the procedure that I have provided informed consent.    Standing Status:  Standing    Number of Occurrences:   1  . Informed Consent Details: Transcribe to consent form and obtain patient signature    Consent Attestation: I, the ordering provider, attest that I have discussed with the patient the benefits, risks, side-effects, alternatives, likelihood of achieving goals, and potential problems during recovery for the procedure that I have provided informed consent.    Standing Status:   Standing    Number of Occurrences:   1    Order Specific Question:   Procedure    Answer:   Bilateral Thoracic facet block under fluoroscopic guidance.    Order Specific Question:   Surgeon    Answer:   Kathlen Brunswick. Dossie Arbour, MD    Order Specific Question:   Indication/Reason    Answer:   Bilateral Thoracic back pain secondary to thoracic facet syndrome  . Miscellanous precautions    NOTE: Although It is true that patients can have allergies to shellfish and that shellfish contain iodine, most shellfish  allergies are due to two protein allergens present in the shellfish: tropomyosins and parvalbumin. Not all patients with shellfish allergies are allergic to iodine. However, as a precaution, avoid using iodine containing products.    Standing Status:   Standing    Number of Occurrences:   1   Chronic Opioid Analgesic:  None provided by our practice. NO OPIOIDS: UDS (08/07/17) (+) for Unreported Benzoylecgonine, a metabolite of cocaine; its presence    indicates use of this drug.  Source is most commonly illicit.    Medications ordered for procedure: Meds ordered this encounter  Medications  . lidocaine (XYLOCAINE) 2 % (with pres) injection 400 mg  . lactated ringers infusion 1,000 mL  . midazolam (VERSED) 5 MG/5ML injection 1-2 mg    Make sure Flumazenil is available in the pyxis when using this medication.  If oversedation occurs, administer 0.2 mg IV over 15 sec. If after 45 sec no response, administer 0.2 mg again over 1 min; may repeat at 1 min intervals; not to exceed 4 doses (1 mg)  . fentaNYL (SUBLIMAZE) injection 25-50 mcg    Make sure Narcan is available in the pyxis when using this medication. In the event of respiratory depression (RR< 8/min): Titrate NARCAN (naloxone) in increments of 0.1 to 0.2 mg IV at 2-3 minute intervals, until desired degree of reversal.  . ropivacaine (PF) 2 mg/mL (0.2%) (NAROPIN) injection 18 mL  . dexamethasone (DECADRON) injection 20 mg   Medications administered: We administered lidocaine, lactated ringers, midazolam, fentaNYL, ropivacaine (PF) 2 mg/mL (0.2%), and dexamethasone.  See the medical record for exact dosing, route, and time of administration.  Follow-up plan:   Return for (VV), 2 wk PP-F/U Eval.       Considering:   NOTE: SHELLFISH Allergy. Diagnostic midline LESI  Possible bilateral lumbar facet RFA  Diagnostic bilateral thoracic facet block #2  Possible bilateral thoracic RFA  Diagnostic left intra-articular knee injection    PRN Procedures:   Diagnostic/therapeutic left-sided CESI #2 (100/100/100) Diagnostic/therapeutic bilateral lumbar facet nerve block #2 (100/100/75/>50)      Recent Visits Date Type Provider Dept  12/18/18 Office Visit Milinda Pointer, MD [facility]  12/04/18 Procedure visit Milinda Pointer, MD [facility]  11/26/18 Office Visit Milinda Pointer, MD [facility]  11/13/18 Procedure visit Milinda Pointer, MD [facility]  Showing recent visits within past 90 days and meeting all other requirements   Today's Visits Date Type Provider Dept  01/08/19 Procedure visit Dossie Arbour,  Beatriz Chancellor, MD [facility]  Showing today's visits and meeting all other requirements   Future Appointments Date Type Provider Dept  02/16/19 Appointment Milinda Pointer, MD  [facility]  Showing future appointments within next 90 days and meeting all other requirements   Disposition: Discharge home  Discharge Date & Time: 01/08/2019; 0928 hrs.   Primary Care Physician: Tracie Harrier, MD Location: [facility] Note by: Gaspar Cola, MD Date: 01/08/2019; Time: 9:58 AM  Disclaimer:  Medicine is not an Chief Strategy Officer. The only guarantee in medicine is that nothing is guaranteed. It is important to note that the decision to proceed with this intervention was based on the information collected from the patient. The Data and conclusions were drawn from the patient's questionnaire, the interview, and the physical examination. Because the information was provided in large part by the patient, it cannot be guaranteed that it has not been purposely or unconsciously manipulated. Every effort has been made to obtain as much relevant data as possible for this evaluation. It is important to note that the conclusions that lead to this procedure are derived in large part from the available data. Always take into account that the treatment will also be dependent on availability of resources and existing treatment guidelines, considered by other Pain Management Practitioners as being common knowledge and practice, at the time of the intervention. For Medico-Legal purposes, it is also important to point out that variation in procedural techniques and pharmacological choices are the acceptable norm. The indications, contraindications, technique, and results of the above procedure should only be interpreted and judged by a Board-Certified Interventional Pain Specialist with extensive familiarity and expertise in the same exact procedure and technique.

## 2019-01-08 NOTE — Progress Notes (Signed)
Safety precautions to be maintained throughout the outpatient stay will include: orient to surroundings, keep bed in low position, maintain call bell within reach at all times, provide assistance with transfer out of bed and ambulation.  

## 2019-01-09 ENCOUNTER — Telehealth: Payer: Self-pay

## 2019-01-09 NOTE — Telephone Encounter (Signed)
Patient states she is a little sore to the touch, but everything is good this morning. Instructed to call if needed.

## 2019-01-14 ENCOUNTER — Telehealth: Payer: Self-pay | Admitting: Pain Medicine

## 2019-01-14 NOTE — Telephone Encounter (Signed)
Spoke with patient.  She states that she stopped both medications at the same time.  When reading throught Dr Dossie Arbour notes, he recommended not to stop the Tizanidine and Bellerica at the same time.  Instructed patient to call the provider who ordered these and discuss it with them.  She states she will call them and let them know that Dr Dossie Arbour had recommended not to stop them at the same time.

## 2019-01-14 NOTE — Telephone Encounter (Signed)
Patient has tapered off of Tizanidine per instructions for [facility] for 2 weeks. She is having severe cramps down both legs into feet. Is there something she can do or get to help with this. Patient is aware Dr. Dossie Arbour not back until Monday. Please call patient and discuss

## 2019-02-12 ENCOUNTER — Encounter: Payer: Self-pay | Admitting: Pain Medicine

## 2019-02-15 NOTE — Progress Notes (Signed)
Pain Management Virtual Encounter Note - Virtual Visit via Telephone Telehealth (real-time audio visits between healthcare provider and patient).   Patient's Phone No. & Preferred Pharmacy:  [phone] (home); [phone] (mobile); (Preferred) [phone] No e-mail address on record  [address], [address] [place] [address] Phone: [phone] Fax: [phone]    Pre-screening note:  Our staff contacted Samantha Macias and offered her an "in person", "face-to-face" appointment versus a telephone encounter. She indicated preferring the telephone encounter, at this time.   Reason for Virtual Visit: COVID-19*  Social distancing based on CDC and AMA recommendations.   I contacted Samantha Macias on 02/16/2019 via telephone.      I clearly identified myself as Gaspar Cola, MD. I verified that I was speaking with the correct person using two identifiers (Name: Samantha Macias, and date of birth: September 01, 1960).  Advanced Informed Consent I sought verbal advanced consent from Samantha Macias for virtual visit interactions. I informed Samantha Macias of possible security and privacy concerns, risks, and limitations associated with providing "not-in-person" medical evaluation and management services. I also informed Samantha Macias of the availability of "in-person" appointments. Finally, I informed her that there would be a charge for the virtual visit and that she could be  personally, fully or partially, financially responsible for it. Samantha Macias expressed understanding and agreed to proceed.   Historic Elements   Samantha Macias is a 58 y.o. year old, female patient evaluated today after her last encounter by our practice on 01/14/2019. Samantha Macias  has a past medical history of Abnormal gait, Allergy, Anxiety, Arthritis, Back pain, Bipolar disorder ([place]), CAD (coronary artery disease), Chronic kidney disease, Collagen vascular disease ([place]),  Colon polyp, COPD (chronic obstructive pulmonary disease) ([place]), Falls, Fibromyalgia, GERD (gastroesophageal reflux disease), Hyperlipidemia, Hypertension, IBS (irritable bowel syndrome), Kidney disease, Migraine, Numbness and tingling, Renal insufficiency, Spinal stenosis, Static encephalopathy, Thyroid nodule (07/2017), and TIA (transient ischemic attack). She also  has a past surgical history that includes Tubal ligation; Nasal sinus surgery; Colonoscopy with propofol (N/A, 04/01/2017); Esophagogastroduodenoscopy (egd) with propofol (N/A, 04/01/2017); and Mouth surgery. Samantha Macias has a current medication list which includes the following prescription(s): albuterol, apple cider vinegar, bupropion, dhea, diclofenac sodium, tylenol pm extra strength, donepezil, doxepin, formoterol, ipratropium, losartan-hydrochlorothiazide, meloxicam, mirtazapine, omeprazole, pantoprazole, quetiapine, ramelteon, sertraline, spiriva handihaler, tizanidine, anoro ellipta, and pregabalin. She  reports that she has been smoking e-cigarettes. She has a 6.25 pack-year smoking history. She has never used smokeless tobacco. She reports current drug use. Drug: Cocaine. She reports that she does not drink alcohol. Samantha Macias is allergic to contrast media [iodinated diagnostic agents]; shellfish allergy; tetanus toxoid adsorbed; ace inhibitors; codeine; nitrofurantoin monohyd macro; and red dye.   HPI  Today, she is being contacted for a post-procedure assessment.  The patient refers that she has attained excellent benefit with the thoracic and lumbar facets.  However, they cervical epidural steroid injection did not work as well.  She does have evidence of cervical facet hypertrophy and therefore is very likely that she is having problems with both.  Today I have offered to do a diagnostic bilateral cervical facet block under fluoroscopic guidance and IV sedation and she has indicated being interested in that.  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  This is especially true for the knee since the last x-ray that we have, even though it is an older one, it did not show any type of pathology at that time.  The patient indicates that she is having an acute episode of the shingles under the area for left breast, suggesting a left T5/T6 dermatomal distribution.  She has already talked to her primary care physician about this and she just received her antiviral medication.  Today she has requested that I refill her Lyrica, which I have agreed to do.  For some reason she interpreted that I had suggested that she go down on it to do a "drug holiday" to the Lyrica.  I do not believe that I have ever suggested that and typically it is a drug holiday to the opioids that I normally recommend and not necessarily to membrane stabilizers.  However, she indicates that she went from 200 mg 3 times daily to not taking anything and at this point she feels that she could use some of the Lyrica again to treat burning sensation from the shingles.  Because of this, I went ahead and refilled her Lyrica with enough refills to last her for approximately 6 months.  She is extremely happy with the results of the diagnostic lumbar and diagnostic thoracic facet blocks.  She is looking forward to perhaps getting similar results in the cervical region.  Post-Procedure Evaluation  Procedure: Diagnostic bilateral Thoracic T7, T8, T9, & T10  Facet Medial Branch block #1 under fluoroscopic guidance and IV sedation Pre-procedure pain level:  7/10 Post-procedure: 0/10 (100% relief)  Sedation: Sedation provided.  Effectiveness during initial hour after procedure(Ultra-Short Term Relief): 80 %   Local anesthetic used: Long-acting (4-6 hours) Effectiveness: Defined as any analgesic  benefit obtained secondary to the administration of local anesthetics. This carries significant diagnostic value as to the etiological location, or anatomical origin, of the pain. Duration of benefit is expected to coincide with the duration of the local anesthetic used.  Effectiveness during initial 4-6 hours after procedure(Short-Term Relief): 100 %   Long-term benefit: Defined as any relief past the pharmacologic duration of the local anesthetics.  Effectiveness past the initial 6 hours after procedure(Long-Term Relief): 100 %   Current benefits: Defined as benefit that persist at this time.   Analgesia:  90-100% better Function: Samantha Macias reports improvement in function ROM: Samantha Macias reports improvement in ROM  Pharmacotherapy Assessment  Analgesic: None provided by our practice. NO OPIOIDS: UDS (08/07/17) (+) for Unreported Benzoylecgonine, a metabolite of cocaine; its presence    indicates use of this drug.  Source is most commonly illicit.   Monitoring: Pharmacotherapy: No side-effects or adverse reactions reported. [place] PMP: PDMP reviewed during this encounter.       Compliance: No problems identified. Effectiveness: Clinically acceptable. Plan: Refer to "POC".  UDS:  Summary  Date Value Ref Range Status  03/17/2018 FINAL  Final    Comment:    ==================================================================== [facility] COMP DRUG ANALYSIS,UR ==================================================================== Test                             Result       Flag       Units Drug Present and Declared for Prescription Verification   Tramadol  1825         EXPECTED   ng/mg creat   O-Desmethyltramadol            929          EXPECTED   ng/mg creat   N-Desmethyltramadol            1352         EXPECTED   ng/mg creat    Source of tramadol is a prescription medication.    O-desmethyltramadol and N-desmethyltramadol are expected    metabolites of tramadol.    Pregabalin                     PRESENT      EXPECTED   Tizanidine                     PRESENT      EXPECTED   Bupropion                      PRESENT      EXPECTED   Hydroxybupropion               PRESENT      EXPECTED    Hydroxybupropion is an expected metabolite of bupropion.   Mirtazapine                    PRESENT      EXPECTED   Quetiapine                     PRESENT      EXPECTED   Salicylate                     PRESENT      EXPECTED Drug Present not Declared for Prescription Verification   Benzoylecgonine                494          UNEXPECTED ng/mg creat    Benzoylecgonine is a metabolite of cocaine; its presence    indicates use of this drug.  Source is most commonly illicit, but    cocaine is present in some topical anesthetic solutions.   Acetaminophen                  PRESENT      UNEXPECTED   Diphenhydramine                PRESENT      UNEXPECTED Drug Absent but Declared for Prescription Verification   Alprazolam                     Not Detected UNEXPECTED ng/mg creat   Sertraline                     Not Detected UNEXPECTED   Diclofenac                     Not Detected UNEXPECTED    Topical diclofenac, as indicated in the declared medication list,    is not always detected even when used as directed.   Ibuprofen                      Not Detected UNEXPECTED    Ibuprofen, as indicated in the declared medication list, is not    always detected even when used as directed. ==================================================================== Test  Result    Flag   Units      Ref Range   Creatinine              159              mg/dL      >=20 ==================================================================== Declared Medications:  The flagging and interpretation on this report are based on the  following declared medications.  Unexpected results may arise from  inaccuracies in the declared medications.  **Note: The testing scope of this panel includes these  medications:  Alprazolam (Xanax)  Bupropion (Wellbutrin)  Mirtazapine (Remeron)  Pregabalin (Lyrica)  Quetiapine (Seroquel)  Sertraline (Zoloft)  Tramadol (Ultram)  **Note: The testing scope of this panel does not include small to  moderate amounts of these reported medications:  Aspirin (Aspirin 81)  Ibuprofen  Tizanidine (Zanaflex)  Topical Diclofenac  **Note: The testing scope of this panel does not include following  reported medications:  Albuterol  Donepezil (Aricept)  Formoterol (Perforomist)  Hydrochlorothiazide (Hyzaar)  Ipratropium  Losartan (Hyzaar)  Omeprazole (Prilosec)  Pantoprazole (Protonix)  Ramelteon (Rozerem)  Tiotropium (Spiriva)  Vitamin D ==================================================================== For clinical consultation, please call [phone]. ====================================================================    Laboratory Chemistry Profile (12 mo)  Renal: 03/17/2018: BUN 22; BUN/Creatinine Ratio 16; Creatinine, Ser 1.35  Lab Results  Component Value Date   GFRAA 51 (L) 03/17/2018   GFRNONAA 44 (L) 03/17/2018   Hepatic: 03/17/2018: Albumin 4.4 Lab Results  Component Value Date   AST 13 03/17/2018   ALT 15 12/24/2017   Other: 03/17/2018: 25-Hydroxy, Vitamin D 41; 25-Hydroxy, Vitamin D-2 <1.0; 25-Hydroxy, Vitamin D-3 41; CRP 17; Sed Rate 28; Vitamin B-12 268 Note: Above Lab results reviewed.  Imaging  Last 90 days:  Dg Pain Clinic C-arm 1-60 Min No Report  Result Date: 01/08/2019 Fluoro was used, but no Radiologist interpretation will be provided. Please refer to "NOTES" tab for provider progress note.  Dg Pain Clinic C-arm 1-60 Min No Report  Result Date: 12/04/2018 Fluoro was used, but no Radiologist interpretation will be provided. Please refer to "NOTES" tab for provider progress note.   Assessment  The primary encounter diagnosis was Chronic pain syndrome. Diagnoses of Chronic low back pain (Primary Area of Pain)  (Bilateral) (L>R) w/ sciatica, Chronic lower extremity pain (Secondary Area of Pain) (Bilateral) (L>R), Chronic upper back pain (Tertiary Area of Pain) (Bilateral) (R>L), Chronic hand pain (Fourth Area of Pain) (Bilateral) (R>L), Cervical facet syndrome (Bilateral), and Cervical facet hypertrophy (Bilateral) were also pertinent to this visit.  Plan of Care  I am having Samantha Macias. Wonders maintain her losartan-hydrochlorothiazide, diclofenac sodium, ramelteon, tiZANidine, donepezil, omeprazole, Spiriva HandiHaler, buPROPion, mirtazapine, sertraline, QUEtiapine, pantoprazole, albuterol, formoterol, ipratropium, Tylenol PM Extra Strength, doxepin, meloxicam, Anoro Ellipta, Apple Cider Vinegar, DHEA, and pregabalin.  Pharmacotherapy (Medications Ordered): Meds ordered this encounter  Medications  . pregabalin (LYRICA) 200 MG capsule    Sig: Take 1 capsule (200 mg total) by mouth 3 (three) times daily.    Dispense:  90 capsule    Refill:  5    Fill one day early if pharmacy is closed on scheduled refill date. May substitute for generic if available.   Orders:  Orders Placed This Encounter  Procedures  . CERVICAL FACET (MEDIAL BRANCH NERVE BLOCK)     Standing Status:   Future    Standing Expiration Date:   03/18/2019    Scheduling Instructions:     Side: Bilateral     Level: C3-4, C4-5, C5-6 Facet  joints (C3, C4, C5, C6, & C7 Medial Branch Nerves)     Sedation: With Sedation.     Timeframe: As soon as schedule allows    Order Specific Question:   Where will this procedure be performed?    Answer:   [facility]   Follow-up plan:   Return for Procedure (w/ sedation): (B) C-FCT BLK #1.      Considering:   NOTE: SHELLFISH Allergy. Diagnostic midline LESI  Possible bilateral lumbar facet RFA  Possible bilateral thoracic RFA  Diagnostic left IA knee injection    PRN Procedures:   Diagnostic bilateral Thoracic T7, T8, T9, & T10  Facet Medial Branch block #2 (80/100/100)   Diagnostic/therapeutic left CESI #2 (100/100/100)  Diagnostic/therapeutic bilateral lumbar facet nerve block #2 (100/100/75/>50)     Recent Visits Date Type Provider Dept  01/08/19 Procedure visit Milinda Pointer, MD [facility]  12/18/18 Office Visit Milinda Pointer, MD [facility]  12/04/18 Procedure visit Milinda Pointer, MD [facility]  11/26/18 Office Visit Milinda Pointer, MD [facility]  Showing recent visits within past 90 days and meeting all other requirements   Today's Visits Date Type Provider Dept  02/16/19 Office Visit Milinda Pointer, MD [facility]  Showing today's visits and meeting all other requirements   Future Appointments No visits were found meeting these conditions.  Showing future appointments within next 90 days and meeting all other requirements   I discussed the assessment and treatment plan with the patient. The patient was provided an opportunity to ask questions and all were answered. The patient agreed with the plan and demonstrated an understanding of the instructions.  Patient advised to call back or seek an in-person evaluation if the symptoms or condition worsens.  Total duration of non-face-to-face encounter: 17 minutes.  Note by: Gaspar Cola, MD Date: 02/16/2019; Time: 4:20 PM  Note: This dictation was prepared with Dragon dictation. Any transcriptional errors that may result from this process are unintentional.  Disclaimer:  * Given the special circumstances of the COVID-19 pandemic, the federal government has announced that the Office for Civil Rights (OCR) will exercise its enforcement discretion and will not impose penalties on physicians using telehealth in the event of noncompliance with regulatory requirements under the [place] (HIPAA) in connection with the good faith provision of telehealth during the XX123456 national  public health emergency. ([place])

## 2019-02-16 ENCOUNTER — Ambulatory Visit: Payer: Medicare Other | Attending: Pain Medicine | Admitting: Pain Medicine

## 2019-02-16 ENCOUNTER — Other Ambulatory Visit: Payer: Self-pay

## 2019-02-16 DIAGNOSIS — G894 Chronic pain syndrome: Secondary | ICD-10-CM

## 2019-02-16 DIAGNOSIS — G8929 Other chronic pain: Secondary | ICD-10-CM

## 2019-02-16 DIAGNOSIS — M79641 Pain in right hand: Secondary | ICD-10-CM

## 2019-02-16 DIAGNOSIS — M549 Dorsalgia, unspecified: Secondary | ICD-10-CM

## 2019-02-16 DIAGNOSIS — M79604 Pain in right leg: Secondary | ICD-10-CM

## 2019-02-16 DIAGNOSIS — M5442 Lumbago with sciatica, left side: Secondary | ICD-10-CM

## 2019-02-16 DIAGNOSIS — M5441 Lumbago with sciatica, right side: Secondary | ICD-10-CM

## 2019-02-16 DIAGNOSIS — M79642 Pain in left hand: Secondary | ICD-10-CM

## 2019-02-16 DIAGNOSIS — M79605 Pain in left leg: Secondary | ICD-10-CM

## 2019-02-16 DIAGNOSIS — M47812 Spondylosis without myelopathy or radiculopathy, cervical region: Secondary | ICD-10-CM

## 2019-02-16 MED ORDER — PREGABALIN 200 MG PO CAPS: 200.0000 mg | ORAL_CAPSULE | Freq: Three times a day (TID) | ORAL | 5 refills | Status: DC

## 2019-02-16 NOTE — Patient Instructions (Signed)

## 2019-02-20 ENCOUNTER — Telehealth: Payer: Self-pay

## 2019-02-20 NOTE — Telephone Encounter (Signed)
Patient called and stated on voice mail that her Lyrica that was prescribed was making her shaky and confused. She didn't know if she should stop taking it. Attempted to call patient at the number she left on answering machine and no answer. No voicemail set up to leave message.

## 2019-02-24 ENCOUNTER — Telehealth: Payer: Self-pay | Admitting: Pain Medicine

## 2019-02-24 NOTE — Telephone Encounter (Signed)
Advised to discuss this with Dr. Dossie Arbour at appointment.

## 2019-02-24 NOTE — Telephone Encounter (Signed)
Patient lvmail 02-23-19 at 3:47 stating she has appt on Thurs for proc. She wants to discuss a script for gel mattress and also needs a statement faxed to [employer] ( she will bring fax # with her ) Did not say what statement is for.

## 2019-02-25 ENCOUNTER — Telehealth: Payer: Self-pay | Admitting: Pain Medicine

## 2019-02-25 NOTE — Telephone Encounter (Signed)
Patient has several things to discuss with Dr. Dossie Arbour and this is a question for the physician that can be discussed at tomorrow's visit with him.

## 2019-02-25 NOTE — Telephone Encounter (Signed)
Patient wants to know if she can try acupuncture and a chiropractor. Please call and let patient know

## 2019-02-26 ENCOUNTER — Ambulatory Visit: Admission: RE | Admit: 2019-02-26 | Payer: Medicare Other | Source: Ambulatory Visit

## 2019-02-26 ENCOUNTER — Telehealth: Payer: Self-pay

## 2019-02-26 ENCOUNTER — Ambulatory Visit (HOSPITAL_BASED_OUTPATIENT_CLINIC_OR_DEPARTMENT_OTHER): Payer: Medicare Other | Admitting: Pain Medicine

## 2019-02-26 ENCOUNTER — Other Ambulatory Visit: Payer: Self-pay

## 2019-02-26 DIAGNOSIS — M542 Cervicalgia: Secondary | ICD-10-CM | POA: Insufficient documentation

## 2019-02-26 DIAGNOSIS — M47812 Spondylosis without myelopathy or radiculopathy, cervical region: Secondary | ICD-10-CM

## 2019-02-26 DIAGNOSIS — M5030 Other cervical disc degeneration, unspecified cervical region: Secondary | ICD-10-CM

## 2019-02-26 DIAGNOSIS — Z91013 Allergy to seafood: Secondary | ICD-10-CM | POA: Insufficient documentation

## 2019-02-26 MED ORDER — LIDOCAINE HCL 2 % IJ SOLN: 20.0000 mL | Freq: Once | INTRAMUSCULAR | Status: DC

## 2019-02-26 MED ORDER — MIDAZOLAM HCL 5 MG/5ML IJ SOLN: 1.0000 mg | INTRAMUSCULAR | Status: AC | PRN

## 2019-02-26 MED ORDER — ROPIVACAINE HCL 2 MG/ML IJ SOLN: 18.0000 mL | Freq: Once | INTRAMUSCULAR | Status: DC

## 2019-02-26 MED ORDER — DEXAMETHASONE SODIUM PHOSPHATE 10 MG/ML IJ SOLN: 20.0000 mg | Freq: Once | INTRAMUSCULAR | Status: DC

## 2019-02-26 MED ORDER — FENTANYL CITRATE (PF) 100 MCG/2ML IJ SOLN: 25.0000 ug | INTRAMUSCULAR | Status: AC | PRN

## 2019-02-26 MED ORDER — LACTATED RINGERS IV SOLN: 1000.0000 mL | Freq: Once | INTRAVENOUS | Status: DC

## 2019-02-26 NOTE — Telephone Encounter (Signed)
Anderson Malta faxed letter to [employer] and mailed copy to patient.

## 2019-02-26 NOTE — Telephone Encounter (Signed)
Attempted to call patient, mailbox is full. 

## 2019-02-26 NOTE — Telephone Encounter (Signed)
The patient called back and wants to know what we are "putting in her", so she can discuss it with [facility].

## 2019-02-26 NOTE — Telephone Encounter (Signed)
She wants to know if we will send her a letter explaining that she was here today and why we couldn't do the procedure. She has to give it to her Education officer, museum.

## 2019-02-26 NOTE — Progress Notes (Deleted)
Patient's Name: Samantha Macias  MRN: [id number]  Referring Provider: Milinda Pointer, MD  DOB: [birth year]  PCP: Tracie Harrier, MD  DOS: 02/26/2019  Note by: Gaspar Cola, MD  Service setting: Ambulatory outpatient  Specialty: Interventional Pain Management  Patient type: Established  Location: [facility] (AMB) Pain Management Facility  Visit type: Interventional Procedure   Primary Reason for Visit: Interventional Pain Management Treatment. CC: No chief complaint on file.  Procedure:          Anesthesia, Analgesia, Anxiolysis:  Type: Cervical Facet Medial Branch Block(s)  #1  Primary Purpose: Diagnostic Region: Posterolateral cervical spine Level: C3, C4, C5, C6, & C7 Medial Branch Level(s). Injecting these levels blocks the C3-4, C4-5, C5-6, and C6-7 cervical facet joints. Laterality: Bilateral  Type: Moderate (Conscious) Sedation combined with Local Anesthesia Indication(s): Analgesia and Anxiety Route: Intravenous (IV) IV Access: Secured Sedation: Meaningful verbal contact was maintained at all times during the procedure  Local Anesthetic: Lidocaine 1-2%  Position: Prone with head of the table raised to facilitate breathing.   Indications: 1. Cervical facet syndrome (Bilateral)   2. Spondylosis without myelopathy or radiculopathy, cervical region   3. Cervicalgia (Bilateral)   4. Cervical facet hypertrophy (Bilateral)   5. DDD (degenerative disc disease), cervical   6. History of allergy to shellfish    Pain Score: Pre-procedure:  /10 Post-procedure:  /10   Pre-op Assessment:  Samantha Macias is a 58 y.o. (year old), female patient, seen today for interventional treatment. She  has a past surgical history that includes Tubal ligation; Nasal sinus surgery; Colonoscopy with propofol (N/A, 04/01/2017); Esophagogastroduodenoscopy (egd) with propofol (N/A, 04/01/2017); and Mouth surgery. Samantha Macias has a current medication list which includes the following prescription(s):  albuterol, apple cider vinegar, bupropion, dhea, diclofenac sodium, tylenol pm extra strength, donepezil, doxepin, formoterol, ipratropium, losartan-hydrochlorothiazide, meloxicam, mirtazapine, omeprazole, pantoprazole, pregabalin, quetiapine, ramelteon, sertraline, spiriva handihaler, tizanidine, and anoro ellipta, and the following Facility-Administered Medications: dexamethasone, fentanyl, lactated ringers, lidocaine, midazolam, and ropivacaine (pf) 2 mg/ml (0.2%). Her primarily concern today is the No chief complaint on file.  Initial Vital Signs:  Pulse/HCG Rate:    Temp:   Resp:   BP:   SpO2:    BMI: Estimated body mass index is 33.45 kg/m as calculated from the following:   Height as of 01/08/19: 5\' 8"  (1.727 m).   Weight as of 01/08/19: 220 lb (99.8 kg).  Risk Assessment: Allergies: Reviewed. She is allergic to contrast media [iodinated diagnostic agents]; shellfish allergy; tetanus toxoid adsorbed; ace inhibitors; codeine; nitrofurantoin monohyd macro; and red dye.  Allergy Precautions: None required Coagulopathies: Reviewed. None identified.  Blood-thinner therapy: None at this time Active Infection(s): Reviewed. None identified. Samantha Macias is afebrile  Site Confirmation: Samantha Macias was asked to confirm the procedure and laterality before marking the site Procedure checklist: Completed Consent: Before the procedure and under the influence of no sedative(s), amnesic(s), or anxiolytics, the patient was informed of the treatment options, risks and possible complications. To fulfill our ethical and legal obligations, as recommended by the American Medical Association's Code of Ethics, I have informed the patient of my clinical impression; the nature and purpose of the treatment or procedure; the risks, benefits, and possible complications of the intervention; the alternatives, including doing nothing; the risk(s) and benefit(s) of the alternative treatment(s) or procedure(s); and the risk(s)  and benefit(s) of doing nothing. The patient was provided information about the general risks and possible complications associated with the procedure. These may include, but are not  limited to: failure to achieve desired goals, infection, bleeding, organ or nerve damage, allergic reactions, paralysis, and death. In addition, the patient was informed of those risks and complications associated to Spine-related procedures, such as failure to decrease pain; infection (i.e.: Meningitis, epidural or intraspinal abscess); bleeding (i.e.: epidural hematoma, subarachnoid hemorrhage, or any other type of intraspinal or peri-dural bleeding); organ or nerve damage (i.e.: Any type of peripheral nerve, nerve root, or spinal cord injury) with subsequent damage to sensory, motor, and/or autonomic systems, resulting in permanent pain, numbness, and/or weakness of one or several areas of the body; allergic reactions; (i.e.: anaphylactic reaction); and/or death. Furthermore, the patient was informed of those risks and complications associated with the medications. These include, but are not limited to: allergic reactions (i.e.: anaphylactic or anaphylactoid reaction(s)); adrenal axis suppression; blood sugar elevation that in diabetics may result in ketoacidosis or comma; water retention that in patients with history of congestive heart failure may result in shortness of breath, pulmonary edema, and decompensation with resultant heart failure; weight gain; swelling or edema; medication-induced neural toxicity; particulate matter embolism and blood vessel occlusion with resultant organ, and/or nervous system infarction; and/or aseptic necrosis of one or more joints. Finally, the patient was informed that Medicine is not an exact science; therefore, there is also the possibility of unforeseen or unpredictable risks and/or possible complications that may result in a catastrophic outcome. The patient indicated having understood very  clearly. We have given the patient no guarantees and we have made no promises. Enough time was given to the patient to ask questions, all of which were answered to the patient's satisfaction. Samantha Macias has indicated that she wanted to continue with the procedure. Attestation: I, the ordering provider, attest that I have discussed with the patient the benefits, risks, side-effects, alternatives, likelihood of achieving goals, and potential problems during recovery for the procedure that I have provided informed consent. Date   Time: {CHL [facility]-PAIN TIME CHOICES:[id number]}  Pre-Procedure Preparation:  Monitoring: As per clinic protocol. Respiration, ETCO2, SpO2, BP, heart rate and rhythm monitor placed and checked for adequate function Safety Precautions: Patient was assessed for positional comfort and pressure points before starting the procedure. Time-out: I initiated and conducted the "Time-out" before starting the procedure, as per protocol. The patient was asked to participate by confirming the accuracy of the "Time Out" information. Verification of the correct person, site, and procedure were performed and confirmed by me, the nursing staff, and the patient. "Time-out" conducted as per Joint Commission's Universal Protocol (UP.01.01.01). Time:    Description of Procedure:          Laterality: Bilateral. The procedure was performed in identical fashion on both sides. Level: C3, C4, C5, C6, & C7 Medial Branch Level(s). Area Prepped: Posterior Cervico-thoracic Region Prepping solution: DuraPrep (Iodine Povacrylex [0.7% available iodine] and Isopropyl Alcohol, 74% w/w) Safety Precautions: Aspiration looking for blood return was conducted prior to all injections. At no point did we inject any substances, as a needle was being advanced. Before injecting, the patient was told to immediately notify me if she was experiencing any new onset of "ringing in the ears, or metallic taste in the mouth". No  attempts were made at seeking any paresthesias. Safe injection practices and needle disposal techniques used. Medications properly checked for expiration dates. SDV (single dose vial) medications used. After the completion of the procedure, all disposable equipment used was discarded in the proper designated medical waste containers. Local Anesthesia: Protocol guidelines were followed. The patient was  positioned over the fluoroscopy table. The area was prepped in the usual manner. The time-out was completed. The target area was identified using fluoroscopy. A 12-in long, straight, sterile hemostat was used with fluoroscopic guidance to locate the targets for each level blocked. Once located, the skin was marked with an approved surgical skin marker. Once all sites were marked, the skin (epidermis, dermis, and hypodermis), as well as deeper tissues (fat, connective tissue and muscle) were infiltrated with a small amount of a short-acting local anesthetic, loaded on a 10cc syringe with a 25G, 1.5-in  Needle. An appropriate amount of time was allowed for local anesthetics to take effect before proceeding to the next step. Local Anesthetic: Lidocaine 2.0% The unused portion of the local anesthetic was discarded in the proper designated containers. Technical explanation of process:  C3 Medial Branch Nerve Block (MBB): The target area for the C3 dorsal medial articular branch is the lateral concave waist of the articular pillar of C3. Under fluoroscopic guidance, a Quincke needle was inserted until contact was made with os over the postero-lateral aspect of the articular pillar of C3 (target area). After negative aspiration for blood, 0.5 mL of the nerve block solution was injected without difficulty or complication. The needle was removed intact. C4 Medial Branch Nerve Block (MBB): The target area for the C4 dorsal medial articular branch is the lateral concave waist of the articular pillar of C4. Under fluoroscopic  guidance, a Quincke needle was inserted until contact was made with os over the postero-lateral aspect of the articular pillar of C4 (target area). After negative aspiration for blood, 0.5 mL of the nerve block solution was injected without difficulty or complication. The needle was removed intact. C5 Medial Branch Nerve Block (MBB): The target area for the C5 dorsal medial articular branch is the lateral concave waist of the articular pillar of C5. Under fluoroscopic guidance, a Quincke needle was inserted until contact was made with os over the postero-lateral aspect of the articular pillar of C5 (target area). After negative aspiration for blood, 0.5 mL of the nerve block solution was injected without difficulty or complication. The needle was removed intact. C6 Medial Branch Nerve Block (MBB): The target area for the C6 dorsal medial articular branch is the lateral concave waist of the articular pillar of C6. Under fluoroscopic guidance, a Quincke needle was inserted until contact was made with os over the postero-lateral aspect of the articular pillar of C6 (target area). After negative aspiration for blood, 0.5 mL of the nerve block solution was injected without difficulty or complication. The needle was removed intact. C7 Medial Branch Nerve Block (MBB): The target for the C7 dorsal medial articular branch lies on the superior-medial tip of the C7 transverse process. Under fluoroscopic guidance, a Quincke needle was inserted until contact was made with os over the postero-lateral aspect of the articular pillar of C7 (target area). After negative aspiration for blood, 0.5 mL of the nerve block solution was injected without difficulty or complication. The needle was removed intact. Procedural Needles: 22-gauge, 3.5-inch, Quincke needles used for all levels. Nerve block solution: 0.2% PF-Ropivacaine + Triamcinolone (40 mg/mL) diluted to a final concentration of 4 mg of Triamcinolone/mL of Ropivacaine The  unused portion of the solution was discarded in the proper designated containers.  Once the entire procedure was completed, the treated area was cleaned, making sure to leave some of the prepping solution back to take advantage of its long term bactericidal properties.  Anatomy Reference Guide:  There were no vitals filed for this visit.  Start Time:   hrs. End Time:   hrs.  Imaging Guidance (Spinal):          Type of Imaging Technique: Fluoroscopy Guidance (Spinal) Indication(s): Assistance in needle guidance and placement for procedures requiring needle placement in or near specific anatomical locations not easily accessible without such assistance. Exposure Time: Please see nurses notes. Contrast: None used. Fluoroscopic Guidance: I was personally present during the use of fluoroscopy. "Tunnel Vision Technique" used to obtain the best possible view of the target area. Parallax error corrected before commencing the procedure. "Direction-depth-direction" technique used to introduce the needle under continuous pulsed fluoroscopy. Once target was reached, antero-posterior, oblique, and lateral fluoroscopic projection used confirm needle placement in all planes. Images permanently stored in EMR. Interpretation: No contrast injected. I personally interpreted the imaging intraoperatively. Adequate needle placement confirmed in multiple planes. Permanent images saved into the patient's record.  Antibiotic Prophylaxis:   Anti-infectives (From admission, onward)   None     Indication(s): None identified  Post-operative Assessment:  Post-procedure Vital Signs:  Pulse/HCG Rate:    Temp:   Resp:   BP:   SpO2:    EBL: None  Complications: No immediate post-treatment complications observed by team, or reported by patient.  Note: The patient tolerated the entire procedure well. A repeat set of vitals were taken after the procedure and the patient was kept under observation following  institutional policy, for this type of procedure. Post-procedural neurological assessment was performed, showing return to baseline, prior to discharge. The patient was provided with post-procedure discharge instructions, including a section on how to identify potential problems. Should any problems arise concerning this procedure, the patient was given instructions to immediately contact us, at any time, without hesitation. In any case, we plan to contact the patient by telephone for a follow-up status report regarding this interventional procedure.  Comments:  No additional relevant information.  Plan of Care  Orders:  Orders Placed This Encounter  Procedures   CERVICAL FACET (MEDIAL BRANCH NERVE BLOCK)     Scheduling Instructions:     Side: Bilateral     Level: C3-4, C4-5, C5-6 Facet joints (C3, C4, C5, C6, & C7 Medial Branch Nerves)     Sedation: With Sedation.     Timeframe: Today    Order Specific Question:   Where will this procedure be performed?    Answer:   [facility]   DG PAIN CL[facility] C-ARM 1-60 MIN NO REPORT    Intraoperative interpretation by procedural physician at [place].    Standing Status:   Standing    Number of Occurrences:   1    Order Specific Question:   Reason for exam:    Answer:   Assistance in needle guidance and placement for procedures requiring needle placement in or near specific anatomical locations not easily accessible without such assistance.   Informed Consent Details: Physician/Practitioner Attestation; Transcribe to consent form and obtain patient signature    Surgeon: Criselda Starke A. Dossie Arbour, MD    Scheduling Instructions:     Procedure: Bilateral Cervical facet block under fluoroscopic guidance.     Indications: Chronic neck pain secondary to cervical facet syndrome   Miscellanous precautions    NOTE: Although It is true that patients can have allergies to shellfish and that shellfish contain iodine, most shellfish   allergies are due to two protein allergens present in the shellfish: tropomyosins and parvalbumin. Not all patients with shellfish  allergies are allergic to iodine. However, as a precaution, avoid using iodine containing products.    Standing Status:   Standing    Number of Occurrences:   1   Chronic Opioid Analgesic:  None provided by our practice. NO OPIOIDS: UDS (08/07/17) (+) for Unreported Benzoylecgonine, a metabolite of cocaine; its presence    indicates use of this drug.  Source is most commonly illicit.   Medications ordered for procedure: Meds ordered this encounter  Medications   lidocaine (XYLOCAINE) 2 % (with pres) injection 400 mg   lactated ringers infusion 1,000 mL   midazolam (VERSED) 5 MG/5ML injection 1-2 mg    Make sure Flumazenil is available in the pyxis when using this medication. If oversedation occurs, administer 0.2 mg IV over 15 sec. If after 45 sec no response, administer 0.2 mg again over 1 min; may repeat at 1 min intervals; not to exceed 4 doses (1 mg)   fentaNYL (SUBLIMAZE) injection 25-50 mcg    Make sure Narcan is available in the pyxis when using this medication. In the event of respiratory depression (RR< 8/min): Titrate NARCAN (naloxone) in increments of 0.1 to 0.2 mg IV at 2-3 minute intervals, until desired degree of reversal.   ropivacaine (PF) 2 mg/mL (0.2%) (NAROPIN) injection 18 mL   dexamethasone (DECADRON) injection 20 mg   Medications administered: Ebbie Ridge. Martinovic had no medications administered during this visit.  See the medical record for exact dosing, route, and time of administration.  Follow-up plan:   Return in about 2 weeks (around 03/12/2019) for (VV), (PP).       Considering:   NOTE: SHELLFISH Allergy. Diagnostic midline LESI  Possible bilateral lumbar facet RFA  Possible bilateral thoracic RFA  Diagnostic left IA knee injection    PRN Procedures:   Diagnostic bilateral cervical facet block #2  Diagnostic bilateral  Thoracic T7, T8, T9, & T10  Facet Medial Branch block #2 (80/100/100)  Diagnostic/therapeutic left CESI #2 (100/100/100)  Diagnostic/therapeutic bilateral lumbar facet nerve block #2 (100/100/75/>50)     Recent Visits Date Type Provider Dept  02/16/19 Office Visit Milinda Pointer, MD [facility]  01/08/19 Procedure visit Milinda Pointer, MD [facility]  12/18/18 Office Visit Milinda Pointer, MD [facility]  12/04/18 Procedure visit Milinda Pointer, MD [facility]  Showing recent visits within past 90 days and meeting all other requirements   Today's Visits Date Type Provider Dept  02/26/19 Procedure visit Milinda Pointer, MD [facility]  Showing today's visits and meeting all other requirements   Future Appointments No visits were found meeting these conditions.  Showing future appointments within next 90 days and meeting all other requirements   Disposition: Discharge home  Discharge Date & Time: 02/26/2019;   hrs.   Primary Care Physician: Tracie Harrier, MD Location: [facility] Note by: Gaspar Cola, MD Date: 02/26/2019; Time: 9:22 AM  Disclaimer:  Medicine is not an Chief Strategy Officer. The only guarantee in medicine is that nothing is guaranteed. It is important to note that the decision to proceed with this intervention was based on the information collected from the patient. The Data and conclusions were drawn from the patient's questionnaire, the interview, and the physical examination. Because the information was provided in large part by the patient, it cannot be guaranteed that it has not been purposely or unconsciously manipulated. Every effort has been made to obtain as much relevant data as possible for this evaluation. It is important  to note that the conclusions that lead to this procedure are derived in large part from the available data. Always take into account that the  treatment will also be dependent on availability of resources and existing treatment guidelines, considered by other Pain Management Practitioners as being common knowledge and practice, at the time of the intervention. For Medico-Legal purposes, it is also important to point out that variation in procedural techniques and pharmacological choices are the acceptable norm. The indications, contraindications, technique, and results of the above procedure should only be interpreted and judged by a Board-Certified Interventional Pain Specialist with extensive familiarity and expertise in the same exact procedure and technique.

## 2019-03-02 ENCOUNTER — Telehealth: Payer: Self-pay | Admitting: Pain Medicine

## 2019-03-02 NOTE — Telephone Encounter (Signed)
Patient lvmail asking to get a script for a gel matress to relieve pressure. I let her know Dr. Dossie Arbour will not be here this week.

## 2019-03-02 NOTE — Telephone Encounter (Signed)
Patient was here last week patient did not mention this to MD. Will send the  message back  to Dr Dossie Arbour.

## 2019-03-12 ENCOUNTER — Other Ambulatory Visit: Payer: Self-pay

## 2019-03-12 ENCOUNTER — Ambulatory Visit
Admission: RE | Admit: 2019-03-12 | Discharge: 2019-03-12 | Disposition: A | Discharge status: Home / Self Care | Payer: Medicare Other | Source: Ambulatory Visit | Attending: Pain Medicine | Admitting: Pain Medicine

## 2019-03-12 ENCOUNTER — Encounter: Payer: Self-pay | Admitting: Pain Medicine

## 2019-03-12 ENCOUNTER — Ambulatory Visit (HOSPITAL_BASED_OUTPATIENT_CLINIC_OR_DEPARTMENT_OTHER): Payer: Medicare Other | Admitting: Pain Medicine

## 2019-03-12 VITALS — BP 126/99 | HR 78 | Temp 98.0°F | Resp 15 | Ht 68.0 in | Wt 217.0 lb

## 2019-03-12 DIAGNOSIS — M542 Cervicalgia: Secondary | ICD-10-CM | POA: Insufficient documentation

## 2019-03-12 DIAGNOSIS — M47812 Spondylosis without myelopathy or radiculopathy, cervical region: Secondary | ICD-10-CM | POA: Diagnosis present

## 2019-03-12 DIAGNOSIS — Z91013 Allergy to seafood: Secondary | ICD-10-CM

## 2019-03-12 MED ORDER — ROPIVACAINE HCL 2 MG/ML IJ SOLN
18.0000 mL | Freq: Once | INTRAMUSCULAR | Status: AC
  Administered 2019-03-12: 10 mL via PERINEURAL
  Filled 2019-03-12: qty 20

## 2019-03-12 MED ORDER — MIDAZOLAM HCL 5 MG/5ML IJ SOLN
1.0000 mg | INTRAMUSCULAR | Status: DC | PRN
  Administered 2019-03-12: 3 mg via INTRAVENOUS
  Filled 2019-03-12: qty 5

## 2019-03-12 MED ORDER — FENTANYL CITRATE (PF) 100 MCG/2ML IJ SOLN
25.0000 ug | INTRAMUSCULAR | Status: DC | PRN
  Administered 2019-03-12: 75 ug via INTRAVENOUS
  Filled 2019-03-12: qty 2

## 2019-03-12 MED ORDER — LIDOCAINE HCL 2 % IJ SOLN
20.0000 mL | Freq: Once | INTRAMUSCULAR | Status: AC
  Administered 2019-03-12: 11:00:00 400 mg
  Filled 2019-03-12: qty 20

## 2019-03-12 MED ORDER — DEXAMETHASONE SODIUM PHOSPHATE 10 MG/ML IJ SOLN
20.0000 mg | Freq: Once | INTRAMUSCULAR | Status: AC
  Administered 2019-03-12: 10 mg
  Filled 2019-03-12: qty 2

## 2019-03-12 MED ORDER — LACTATED RINGERS IV SOLN
1000.0000 mL | Freq: Once | INTRAVENOUS | Status: AC
  Administered 2019-03-12: 1000 mL via INTRAVENOUS

## 2019-03-12 NOTE — Patient Instructions (Signed)

## 2019-03-12 NOTE — Progress Notes (Signed)
Delta model 23Patient's Name: Samantha Macias  MRN: [id number]  Referring Provider: Milinda Pointer, MD  DOB: Mar 08, 1961  PCP: Tracie Harrier, MD  DOS: 03/12/2019  Note by: Gaspar Cola, MD  Service setting: Ambulatory outpatient  Specialty: Interventional Pain Management  Patient type: Established  Location: [facility] (AMB) Pain Management Facility  Visit type: Interventional Procedure   Primary Reason for Visit: Interventional Pain Management Treatment. CC: Neck Pain  Procedure:          Anesthesia, Analgesia, Anxiolysis:  Type: Cervical Facet Medial Branch Block(s)  #1  Primary Purpose: Diagnostic Region: Posterolateral cervical spine Level: C3, C4, C5, C6, & C7 Medial Branch Level(s). Injecting these levels blocks the C3-4, C4-5, C5-6, and C6-7 cervical facet joints. Laterality: Bilateral  Type: Moderate (Conscious) Sedation combined with Local Anesthesia Indication(s): Analgesia and Anxiety Route: Intravenous (IV) IV Access: Secured Sedation: Meaningful verbal contact was maintained at all times during the procedure  Local Anesthetic: Lidocaine 1-2%  Position: Prone with head of the table raised to facilitate breathing.   Indications: 1. Cervical facet syndrome (Bilateral)   2. Spondylosis without myelopathy or radiculopathy, cervical region   3. Cervicalgia (Bilateral)   4. Cervical facet hypertrophy (Bilateral)   5. History of allergy to shellfish    Pain Score: Pre-procedure: 5 /10 Post-procedure: 3 /10   Pre-op Assessment:  Ms. Gago is a 58 y.o. (year old), female patient, seen today for interventional treatment. She  has a past surgical history that includes Tubal ligation; Nasal sinus surgery; Colonoscopy with propofol (N/A, 04/01/2017); Esophagogastroduodenoscopy (egd) with propofol (N/A, 04/01/2017); and Mouth surgery. Ms. Langworthy has a current medication list which includes the following prescription(s): albuterol, apple cider vinegar, bupropion, dhea,  diclofenac sodium, tylenol pm extra strength, donepezil, doxepin, formoterol, ipratropium, losartan-hydrochlorothiazide, meloxicam, mirtazapine, omeprazole, pantoprazole, pregabalin, quetiapine, ramelteon, sertraline, spiriva handihaler, tizanidine, and anoro ellipta, and the following Facility-Administered Medications: dexamethasone, fentanyl, lactated ringers, lidocaine, midazolam, and ropivacaine (pf) 2 mg/ml (0.2%). Her primarily concern today is the Neck Pain  Initial Vital Signs:  Pulse/HCG Rate: 78ECG Heart Rate: 75 Temp: 98 F (36.7 C) Resp: 12 BP: 132/84 SpO2: 100 %  BMI: Estimated body mass index is 32.99 kg/m as calculated from the following:   Height as of this encounter: 5\' 8"  (1.727 m).   Weight as of this encounter: 217 lb (98.4 kg).  Risk Assessment: Allergies: Reviewed. She is allergic to contrast media [iodinated diagnostic agents]; shellfish allergy; tetanus toxoid adsorbed; ace inhibitors; codeine; nitrofurantoin monohyd macro; and red dye.  Allergy Precautions: None required Coagulopathies: Reviewed. None identified.  Blood-thinner therapy: None at this time Active Infection(s): Reviewed. None identified. Ms. Lowenthal is afebrile  Site Confirmation: Ms. Strome was asked to confirm the procedure and laterality before marking the site Procedure checklist: Completed Consent: Before the procedure and under the influence of no sedative(s), amnesic(s), or anxiolytics, the patient was informed of the treatment options, risks and possible complications. To fulfill our ethical and legal obligations, as recommended by the American Medical Association's Code of Ethics, I have informed the patient of my clinical impression; the nature and purpose of the treatment or procedure; the risks, benefits, and possible complications of the intervention; the alternatives, including doing nothing; the risk(s) and benefit(s) of the alternative treatment(s) or procedure(s); and the risk(s) and  benefit(s) of doing nothing. The patient was provided information about the general risks and possible complications associated with the procedure. These may include, but are not limited to: failure to achieve desired goals, infection, bleeding,  organ or nerve damage, allergic reactions, paralysis, and death. In addition, the patient was informed of those risks and complications associated to Spine-related procedures, such as failure to decrease pain; infection (i.e.: Meningitis, epidural or intraspinal abscess); bleeding (i.e.: epidural hematoma, subarachnoid hemorrhage, or any other type of intraspinal or peri-dural bleeding); organ or nerve damage (i.e.: Any type of peripheral nerve, nerve root, or spinal cord injury) with subsequent damage to sensory, motor, and/or autonomic systems, resulting in permanent pain, numbness, and/or weakness of one or several areas of the body; allergic reactions; (i.e.: anaphylactic reaction); and/or death. Furthermore, the patient was informed of those risks and complications associated with the medications. These include, but are not limited to: allergic reactions (i.e.: anaphylactic or anaphylactoid reaction(s)); adrenal axis suppression; blood sugar elevation that in diabetics may result in ketoacidosis or comma; water retention that in patients with history of congestive heart failure may result in shortness of breath, pulmonary edema, and decompensation with resultant heart failure; weight gain; swelling or edema; medication-induced neural toxicity; particulate matter embolism and blood vessel occlusion with resultant organ, and/or nervous system infarction; and/or aseptic necrosis of one or more joints. Finally, the patient was informed that Medicine is not an exact science; therefore, there is also the possibility of unforeseen or unpredictable risks and/or possible complications that may result in a catastrophic outcome. The patient indicated having understood very  clearly. We have given the patient no guarantees and we have made no promises. Enough time was given to the patient to ask questions, all of which were answered to the patient's satisfaction. Ms. Riggles has indicated that she wanted to continue with the procedure. Attestation: I, the ordering provider, attest that I have discussed with the patient the benefits, risks, side-effects, alternatives, likelihood of achieving goals, and potential problems during recovery for the procedure that I have provided informed consent. Date   Time: 03/12/2019 10:36 AM  Pre-Procedure Preparation:  Monitoring: As per clinic protocol. Respiration, ETCO2, SpO2, BP, heart rate and rhythm monitor placed and checked for adequate function Safety Precautions: Patient was assessed for positional comfort and pressure points before starting the procedure. Time-out: I initiated and conducted the "Time-out" before starting the procedure, as per protocol. The patient was asked to participate by confirming the accuracy of the "Time Out" information. Verification of the correct person, site, and procedure were performed and confirmed by me, the nursing staff, and the patient. "Time-out" conducted as per Joint Commission's Universal Protocol (UP.01.01.01). Time: 1126  Description of Procedure:          Laterality: Bilateral. The procedure was performed in identical fashion on both sides. Level: C3, C4, C5, C6, & C7 Medial Branch Level(s). Area Prepped: Posterior Cervico-thoracic Region Prepping solution: DuraPrep (Iodine Povacrylex [0.7% available iodine] and Isopropyl Alcohol, 74% w/w) Safety Precautions: Aspiration looking for blood return was conducted prior to all injections. At no point did we inject any substances, as a needle was being advanced. Before injecting, the patient was told to immediately notify me if she was experiencing any new onset of "ringing in the ears, or metallic taste in the mouth". No attempts were made at  seeking any paresthesias. Safe injection practices and needle disposal techniques used. Medications properly checked for expiration dates. SDV (single dose vial) medications used. After the completion of the procedure, all disposable equipment used was discarded in the proper designated medical waste containers. Local Anesthesia: Protocol guidelines were followed. The patient was positioned over the fluoroscopy table. The area was prepped in the  usual manner. The time-out was completed. The target area was identified using fluoroscopy. A 12-in long, straight, sterile hemostat was used with fluoroscopic guidance to locate the targets for each level blocked. Once located, the skin was marked with an approved surgical skin marker. Once all sites were marked, the skin (epidermis, dermis, and hypodermis), as well as deeper tissues (fat, connective tissue and muscle) were infiltrated with a small amount of a short-acting local anesthetic, loaded on a 10cc syringe with a 25G, 1.5-in  Needle. An appropriate amount of time was allowed for local anesthetics to take effect before proceeding to the next step. Local Anesthetic: Lidocaine 2.0% The unused portion of the local anesthetic was discarded in the proper designated containers. Technical explanation of process:  C3 Medial Branch Nerve Block (MBB): The target area for the C3 dorsal medial articular branch is the lateral concave waist of the articular pillar of C3. Under fluoroscopic guidance, a Quincke needle was inserted until contact was made with os over the postero-lateral aspect of the articular pillar of C3 (target area). After negative aspiration for blood, 0.5 mL of the nerve block solution was injected without difficulty or complication. The needle was removed intact. C4 Medial Branch Nerve Block (MBB): The target area for the C4 dorsal medial articular branch is the lateral concave waist of the articular pillar of C4. Under fluoroscopic guidance, a Quincke  needle was inserted until contact was made with os over the postero-lateral aspect of the articular pillar of C4 (target area). After negative aspiration for blood, 0.5 mL of the nerve block solution was injected without difficulty or complication. The needle was removed intact. C5 Medial Branch Nerve Block (MBB): The target area for the C5 dorsal medial articular branch is the lateral concave waist of the articular pillar of C5. Under fluoroscopic guidance, a Quincke needle was inserted until contact was made with os over the postero-lateral aspect of the articular pillar of C5 (target area). After negative aspiration for blood, 0.5 mL of the nerve block solution was injected without difficulty or complication. The needle was removed intact. C6 Medial Branch Nerve Block (MBB): The target area for the C6 dorsal medial articular branch is the lateral concave waist of the articular pillar of C6. Under fluoroscopic guidance, a Quincke needle was inserted until contact was made with os over the postero-lateral aspect of the articular pillar of C6 (target area). After negative aspiration for blood, 0.5 mL of the nerve block solution was injected without difficulty or complication. The needle was removed intact. C7 Medial Branch Nerve Block (MBB): The target for the C7 dorsal medial articular branch lies on the superior-medial tip of the C7 transverse process. Under fluoroscopic guidance, a Quincke needle was inserted until contact was made with os over the postero-lateral aspect of the articular pillar of C7 (target area). After negative aspiration for blood, 0.5 mL of the nerve block solution was injected without difficulty or complication. The needle was removed intact. Procedural Needles: 22-gauge, 3.5-inch, Quincke needles used for all levels. Nerve block solution: 0.2% PF-Ropivacaine + Triamcinolone (40 mg/mL) diluted to a final concentration of 4 mg of Triamcinolone/mL of Ropivacaine The unused portion of the  solution was discarded in the proper designated containers.  Once the entire procedure was completed, the treated area was cleaned, making sure to leave some of the prepping solution back to take advantage of its long term bactericidal properties.  Anatomy Reference Guide:       Vitals:   03/12/19 1136 03/12/19  1142 03/12/19 1150 03/12/19 1200  BP: 128/90 (!) 130/100 (!) 110/98 (!) 126/99  Pulse:      Resp: 10 11 14 15   Temp:      SpO2: 93% 94% 98% 99%  Weight:      Height:        Start Time: 1126 hrs. End Time: 1141 hrs.  Imaging Guidance (Spinal):          Type of Imaging Technique: Fluoroscopy Guidance (Spinal) Indication(s): Assistance in needle guidance and placement for procedures requiring needle placement in or near specific anatomical locations not easily accessible without such assistance. Exposure Time: Please see nurses notes. Contrast: None used. Fluoroscopic Guidance: I was personally present during the use of fluoroscopy. "Tunnel Vision Technique" used to obtain the best possible view of the target area. Parallax error corrected before commencing the procedure. "Direction-depth-direction" technique used to introduce the needle under continuous pulsed fluoroscopy. Once target was reached, antero-posterior, oblique, and lateral fluoroscopic projection used confirm needle placement in all planes. Images permanently stored in EMR. Interpretation: No contrast injected. I personally interpreted the imaging intraoperatively. Adequate needle placement confirmed in multiple planes. Permanent images saved into the patient's record.  Antibiotic Prophylaxis:   Anti-infectives (From admission, onward)   None     Indication(s): None identified  Post-operative Assessment:  Post-procedure Vital Signs:  Pulse/HCG Rate: 7880 Temp: 98 F (36.7 C) Resp: 15 BP: (!) 126/99 SpO2: 99 %  EBL: None  Complications: No immediate post-treatment complications observed by team, or  reported by patient.  Note: The patient tolerated the entire procedure well. A repeat set of vitals were taken after the procedure and the patient was kept under observation following institutional policy, for this type of procedure. Post-procedural neurological assessment was performed, showing return to baseline, prior to discharge. The patient was provided with post-procedure discharge instructions, including a section on how to identify potential problems. Should any problems arise concerning this procedure, the patient was given instructions to immediately contact us, at any time, without hesitation. In any case, we plan to contact the patient by telephone for a follow-up status report regarding this interventional procedure.  Comments:  No additional relevant information.  Plan of Care  Orders:  Orders Placed This Encounter  Procedures   CERVICAL FACET (MEDIAL BRANCH NERVE BLOCK)     Scheduling Instructions:     Side: Bilateral     Level: C3-4, C4-5, C5-6 Facet joints (C3, C4, C5, C6, & C7 Medial Branch Nerves)     Sedation: With Sedation.     Timeframe: Today    Order Specific Question:   Where will this procedure be performed?    Answer:   [facility]   DG PAIN [facility] C-ARM 1-60 MIN NO REPORT    Intraoperative interpretation by procedural physician at [place].    Standing Status:   Standing    Number of Occurrences:   1    Order Specific Question:   Reason for exam:    Answer:   Assistance in needle guidance and placement for procedures requiring needle placement in or near specific anatomical locations not easily accessible without such assistance.   Informed Consent Details: Physician/Practitioner Attestation; Transcribe to consent form and obtain patient signature    Provider Attestation: I, [place] Dossie Arbour, MD, (Pain Management Specialist), the physician/practitioner, attest that I have discussed with the patient the benefits, risks, side effects,  alternatives, likelihood of achieving goals and potential problems during recovery for the procedure that I  have provided informed consent.    Scheduling Instructions:     Procedure: Bilateral Cervical facet block under fluoroscopic guidance.     Indications: Chronic neck pain secondary to cervical facet syndrome     Note: Always confirm laterality of pain with [name], before procedure.     Transcribe to consent form and obtain patient signature.   Provide equipment / supplies at bedside    Equipment required: Single use, disposable, "Block Tray"    Standing Status:   Standing    Number of Occurrences:   1    Order Specific Question:   Specify    Answer:   Block Tray   Miscellanous precautions    NOTE: Although It is true that patients can have allergies to shellfish and that shellfish contain iodine, most shellfish  allergies are due to two protein allergens present in the shellfish: tropomyosins and parvalbumin. Not all patients with shellfish allergies are allergic to iodine. However, as a precaution, avoid using iodine containing products.    Standing Status:   Standing    Number of Occurrences:   1   Miscellanous precautions    Standing Status:   Standing    Number of Occurrences:   1   Chronic Opioid Analgesic:  None provided by our practice. NO OPIOIDS: UDS (08/07/17) (+) for Unreported Benzoylecgonine, a metabolite of cocaine; its presence    indicates use of this drug.  Source is most commonly illicit.   Medications ordered for procedure: Meds ordered this encounter  Medications   lidocaine (XYLOCAINE) 2 % (with pres) injection 400 mg   lactated ringers infusion 1,000 mL   midazolam (VERSED) 5 MG/5ML injection 1-2 mg    Make sure Flumazenil is available in the pyxis when using this medication. If oversedation occurs, administer 0.2 mg IV over 15 sec. If after 45 sec no response, administer 0.2 mg again over 1 min; may repeat at 1 min intervals; not to exceed 4 doses (1  mg)   fentaNYL (SUBLIMAZE) injection 25-50 mcg    Make sure Narcan is available in the pyxis when using this medication. In the event of respiratory depression (RR< 8/min): Titrate NARCAN (naloxone) in increments of 0.1 to 0.2 mg IV at 2-3 minute intervals, until desired degree of reversal.   ropivacaine (PF) 2 mg/mL (0.2%) (NAROPIN) injection 18 mL   dexamethasone (DECADRON) injection 20 mg   Medications administered: We administered lidocaine, lactated ringers, midazolam, fentaNYL, ropivacaine (PF) 2 mg/mL (0.2%), and dexamethasone.  See the medical record for exact dosing, route, and time of administration.  Follow-up plan:   Return in about 2 weeks (around 03/26/2019) for (VV), (MM).      Considering:   NOTE: SHELLFISH Allergy. Diagnostic midline LESI  Possible bilateral lumbar facet RFA  Possible bilateral thoracic RFA  Diagnostic left IA knee injection    PRN Procedures:   Diagnostic bilateral cervical facet block #2  Diagnostic bilateral Thoracic T7, T8, T9, & T10  Facet Medial Branch block #2 (80/100/100)  Diagnostic/therapeutic left CESI #2 (100/100/100)  Diagnostic/therapeutic bilateral lumbar facet nerve block #2 (100/100/75/>50)     Recent Visits Date Type Provider Dept  02/16/19 Office Visit Milinda Pointer, MD [facility]  01/08/19 Procedure visit Milinda Pointer, MD [facility]  12/18/18 Office Visit Milinda Pointer, MD [facility]  Showing recent visits within past 90 days and meeting all other requirements   Today's Visits Date Type Provider Dept  03/12/19 Procedure visit Milinda Pointer, MD [facility]  Showing today's visits and meeting all other requirements   Future Appointments Date Type Provider Dept  04/01/19 Appointment Milinda Pointer, MD [facility]  Showing future appointments within next 90 days and meeting all other requirements   Disposition: Discharge home  Discharge Date &  Time: 03/12/2019; 1208 hrs.   Primary Care Physician: Tracie Harrier, MD Location: [facility] Note by: Gaspar Cola, MD Date: 03/12/2019; Time: 1:17 PM  Disclaimer:  Medicine is not an Chief Strategy Officer. The only guarantee in medicine is that nothing is guaranteed. It is important to note that the decision to proceed with this intervention was based on the information collected from the patient. The Data and conclusions were drawn from the patient's questionnaire, the interview, and the physical examination. Because the information was provided in large part by the patient, it cannot be guaranteed that it has not been purposely or unconsciously manipulated. Every effort has been made to obtain as much relevant data as possible for this evaluation. It is important to note that the conclusions that lead to this procedure are derived in large part from the available data. Always take into account that the treatment will also be dependent on availability of resources and existing treatment guidelines, considered by other Pain Management Practitioners as being common knowledge and practice, at the time of the intervention. For Medico-Legal purposes, it is also important to point out that variation in procedural techniques and pharmacological choices are the acceptable norm. The indications, contraindications, technique, and results of the above procedure should only be interpreted and judged by a Board-Certified Interventional Pain Specialist with extensive familiarity and expertise in the same exact procedure and technique.

## 2019-03-13 ENCOUNTER — Telehealth: Payer: Self-pay

## 2019-03-13 NOTE — Telephone Encounter (Signed)
Pt was called and no problems reported. 

## 2019-03-31 ENCOUNTER — Encounter: Payer: Self-pay | Admitting: Pain Medicine

## 2019-03-31 NOTE — Progress Notes (Signed)
Patient wants to know if you could write her a prescription for Meloxicam 15 mg

## 2019-04-01 ENCOUNTER — Other Ambulatory Visit: Payer: Self-pay

## 2019-04-01 ENCOUNTER — Ambulatory Visit: Payer: Medicare Other | Attending: Pain Medicine | Admitting: Pain Medicine

## 2019-04-01 DIAGNOSIS — M4802 Spinal stenosis, cervical region: Secondary | ICD-10-CM

## 2019-04-01 DIAGNOSIS — M79641 Pain in right hand: Secondary | ICD-10-CM

## 2019-04-01 DIAGNOSIS — M79604 Pain in right leg: Secondary | ICD-10-CM

## 2019-04-01 DIAGNOSIS — M5416 Radiculopathy, lumbar region: Secondary | ICD-10-CM

## 2019-04-01 DIAGNOSIS — M48061 Spinal stenosis, lumbar region without neurogenic claudication: Secondary | ICD-10-CM

## 2019-04-01 DIAGNOSIS — M47812 Spondylosis without myelopathy or radiculopathy, cervical region: Secondary | ICD-10-CM

## 2019-04-01 DIAGNOSIS — M5136 Other intervertebral disc degeneration, lumbar region: Secondary | ICD-10-CM

## 2019-04-01 DIAGNOSIS — M542 Cervicalgia: Secondary | ICD-10-CM | POA: Diagnosis not present

## 2019-04-01 DIAGNOSIS — M5441 Lumbago with sciatica, right side: Secondary | ICD-10-CM

## 2019-04-01 DIAGNOSIS — G894 Chronic pain syndrome: Secondary | ICD-10-CM

## 2019-04-01 DIAGNOSIS — M47816 Spondylosis without myelopathy or radiculopathy, lumbar region: Secondary | ICD-10-CM

## 2019-04-01 DIAGNOSIS — M5442 Lumbago with sciatica, left side: Secondary | ICD-10-CM

## 2019-04-01 DIAGNOSIS — M79605 Pain in left leg: Secondary | ICD-10-CM

## 2019-04-01 DIAGNOSIS — G8929 Other chronic pain: Secondary | ICD-10-CM

## 2019-04-01 DIAGNOSIS — G959 Disease of spinal cord, unspecified: Secondary | ICD-10-CM

## 2019-04-01 DIAGNOSIS — M549 Dorsalgia, unspecified: Secondary | ICD-10-CM

## 2019-04-01 DIAGNOSIS — M79642 Pain in left hand: Secondary | ICD-10-CM

## 2019-04-01 NOTE — Progress Notes (Signed)
Pain Management Virtual Encounter Note - Virtual Visit via Telephone Telehealth (real-time audio visits between healthcare provider and patient).   Patient's Phone No. & Preferred Pharmacy:  [phone] (home); [phone] (mobile); (Preferred) [phone] No e-mail address on record  [place], [place] [place] [address] Phone: [phone] Fax: [phone]    Pre-screening note:  Our staff contacted Ms. Jungbluth and offered her an "in person", "face-to-face" appointment versus a telephone encounter. She indicated preferring the telephone encounter, at this time.   Reason for Virtual Visit: COVID-19*  Social distancing based on CDC and AMA recommendations.   I contacted [name] on 04/01/2019 via telephone.      I clearly identified myself as Gaspar Cola, MD. I verified that I was speaking with the correct person using two identifiers (Name: Rilea Yearby, and date of birth: [birth year]).  Advanced Informed Consent I sought verbal advanced consent from [name] for virtual visit interactions. I informed Ms. Cherrington of possible security and privacy concerns, risks, and limitations associated with providing "not-in-person" medical evaluation and management services. I also informed Ms. Waage of the availability of "in-person" appointments. Finally, I informed her that there would be a charge for the virtual visit and that she could be  personally, fully or partially, financially responsible for it. Ms. Meyerhoff expressed understanding and agreed to proceed.   Historic Elements   Ms. Jazzlyn Borgia is a 58 y.o. year old, female patient evaluated today after her last encounter by our practice on 03/13/2019. Ms. Salvati  has a past medical history of Abnormal gait, Allergy, Anxiety, Arthritis, Back pain, Bipolar disorder ([place]), CAD (coronary artery disease), Chronic kidney disease, Collagen vascular disease ([place]),  Colon polyp, COPD (chronic obstructive pulmonary disease) ([place]), [place], Fibromyalgia, GERD (gastroesophageal reflux disease), Hyperlipidemia, Hypertension, IBS (irritable bowel syndrome), Kidney disease, Migraine, Numbness and tingling, Renal insufficiency, Spinal stenosis, Static encephalopathy, Thyroid nodule (07/2017), and TIA (transient ischemic attack). She also  has a past surgical history that includes Tubal ligation; Nasal sinus surgery; Colonoscopy with propofol (N/A, 04/01/2017); Esophagogastroduodenoscopy (egd) with propofol (N/A, 04/01/2017); and Mouth surgery. Ms. Costenbader has a current medication list which includes the following prescription(s): albuterol, apple cider vinegar, bupropion, dhea, diclofenac sodium, tylenol pm extra strength, donepezil, doxepin, formoterol, ipratropium, losartan-hydrochlorothiazide, meloxicam, mirtazapine, omeprazole, pantoprazole, pregabalin, quetiapine, ramelteon, sertraline, spiriva handihaler, tizanidine, and anoro ellipta. She  reports that she has been smoking cigarettes. She has a 6.25 pack-year smoking history. She has never used smokeless tobacco. She reports current drug use. Drug: Cocaine. She reports that she does not drink alcohol. Ms. Bubier is allergic to contrast media [iodinated diagnostic agents]; shellfish allergy; tetanus toxoid adsorbed; ace inhibitors; codeine; nitrofurantoin monohyd macro; and red dye.   HPI  Today, she is being contacted for a post-procedure assessment.  Today I had an extremely long conversation with this patient where I explained to her about her pain and how to interpret the results of the diagnostic blocks that we had done.  I also explained to her about the axial versus extremity pain in both the cervical and the lumbar region.  Because of this, she now understands better what is going on and worse some of these pains are coming from including this left hip and left knee pain that is bothering her so much.  The x-rays on  those areas were benign but the lumbar MRI do show that she has a lot of problems which would  indicate that it is likely that that is the origin of the pain.  At this point in after having had all this explained to her, she is interested in pursuing a second diagnostic injection for the lumbar facet syndrome with Amy and possibly going for radiofrequency ablation.  She has also indicated that her neck pain and upper extremity pain as well as the low back pain and lower extremity pain have been worsening over the years and she is interested in having the MRIs repeated.  I have explained to her that if the symptoms in fact have worsened, then it would probably be indicated to go ahead and repeat those MRIs.  She also confirmed that she has been having pain, numbness, and weakness affecting the extremities suggesting the possibility of radiculopathy/radiculitis, at multiple levels and multiple areas.  The pending on the results of those MRIs, we may consider to get a neurosurgeon involved.  Post-Procedure Evaluation  Procedure: Diagnostic bilateral cervical facet block #1 under fluoroscopic guidance and IV sedation Pre-procedure pain level:  5/10 Post-procedure: 3/10 (< 50% relief)  Sedation: Sedation provided.  Effectiveness during initial hour after procedure(Ultra-Short Term Relief): 90 %   Local anesthetic used: Long-acting (4-6 hours) Effectiveness: Defined as any analgesic benefit obtained secondary to the administration of local anesthetics. This carries significant diagnostic value as to the etiological location, or anatomical origin, of the pain. Duration of benefit is expected to coincide with the duration of the local anesthetic used.  Effectiveness during initial 4-6 hours after procedure(Short-Term Relief): 90 %   Long-term benefit: Defined as any relief past the pharmacologic duration of the local anesthetics.  Effectiveness past the initial 6 hours after procedure(Long-Term Relief): 25 %(2  weeks)   Current benefits: Defined as benefit that persist at this time.   Analgesia:  Back to baseline Function: Back to baseline ROM: Back to baseline  Pharmacotherapy Assessment  Analgesic: None provided by our practice. NO OPIOIDS: UDS (08/07/17) (+) for Unreported Benzoylecgonine, a metabolite of cocaine; its presence    indicates use of this drug.  Source is most commonly illicit.   Monitoring: Pharmacotherapy: No side-effects or adverse reactions reported. [place] PMP: PDMP reviewed during this encounter.       Compliance: No problems identified. Effectiveness: Clinically acceptable. Plan: Refer to "POC".  UDS:  Summary  Date Value Ref Range Status  03/17/2018 FINAL  Final    Comment:    ==================================================================== TOXASSURE COMP DRUG ANALYSIS,UR ==================================================================== Test                             Result       Flag       Units Drug Present and Declared for Prescription Verification   Tramadol                       1825         EXPECTED   ng/mg creat   O-Desmethyltramadol            929          EXPECTED   ng/mg creat   N-Desmethyltramadol            1352         EXPECTED   ng/mg creat    Source of tramadol is a prescription medication.    O-desmethyltramadol and N-desmethyltramadol are expected    metabolites of tramadol.   Pregabalin  PRESENT      EXPECTED   Tizanidine                     PRESENT      EXPECTED   Bupropion                      PRESENT      EXPECTED   Hydroxybupropion               PRESENT      EXPECTED    Hydroxybupropion is an expected metabolite of bupropion.   Mirtazapine                    PRESENT      EXPECTED   Quetiapine                     PRESENT      EXPECTED   Salicylate                     PRESENT      EXPECTED Drug Present not Declared for Prescription Verification   Benzoylecgonine                494          UNEXPECTED ng/mg creat     Benzoylecgonine is a metabolite of cocaine; its presence    indicates use of this drug.  Source is most commonly illicit, but    cocaine is present in some topical anesthetic solutions.   Acetaminophen                  PRESENT      UNEXPECTED   Diphenhydramine                PRESENT      UNEXPECTED Drug Absent but Declared for Prescription Verification   Alprazolam                     Not Detected UNEXPECTED ng/mg creat   Sertraline                     Not Detected UNEXPECTED   Diclofenac                     Not Detected UNEXPECTED    Topical diclofenac, as indicated in the declared medication list,    is not always detected even when used as directed.   Ibuprofen                      Not Detected UNEXPECTED    Ibuprofen, as indicated in the declared medication list, is not    always detected even when used as directed. ==================================================================== Test                      Result    Flag   Units      Ref Range   Creatinine              159              mg/dL      >=20 ==================================================================== Declared Medications:  The flagging and interpretation on this report are based on the  following declared medications.  Unexpected results may arise from  inaccuracies in the declared medications.  **Note: The testing scope of this panel includes these medications:  Alprazolam (Xanax)  Bupropion (Wellbutrin)  Mirtazapine (Remeron)  Pregabalin (Lyrica)  Quetiapine (Seroquel)  Sertraline (Zoloft)  Tramadol (Ultram)  **Note: The testing scope of this panel does not include small to  moderate amounts of these reported medications:  Aspirin (Aspirin 81)  Ibuprofen  Tizanidine (Zanaflex)  Topical Diclofenac  **Note: The testing scope of this panel does not include following  reported medications:  Albuterol  Donepezil (Aricept)  Formoterol (Perforomist)  Hydrochlorothiazide (Hyzaar)  Ipratropium  Losartan  (Hyzaar)  Omeprazole (Prilosec)  Pantoprazole (Protonix)  Ramelteon (Rozerem)  Tiotropium (Spiriva)  Vitamin D ==================================================================== For clinical consultation, please call [phone]. ====================================================================    Laboratory Chemistry Profile (12 mo)  Renal: No results found for requested labs within last 8760 hours.  Lab Results  Component Value Date   GFRAA 51 (L) 03/17/2018   GFRNONAA 44 (L) 03/17/2018   Hepatic: No results found for requested labs within last 8760 hours. Lab Results  Component Value Date   AST 13 03/17/2018   ALT 15 12/24/2017   Other: No results found for requested labs within last 8760 hours. Note: Above Lab results reviewed.  Imaging  Last 90 days:  Dg Pain Clinic C-arm 1-60 Min No Report  Result Date: 03/12/2019 Fluoro was used, but no Radiologist interpretation will be provided. Please refer to "NOTES" tab for provider progress note.  Dg Pain Clinic C-arm 1-60 Min No Report  Result Date: 01/08/2019 Fluoro was used, but no Radiologist interpretation will be provided. Please refer to "NOTES" tab for provider progress note.   Assessment  The primary encounter diagnosis was Cervical facet syndrome (Bilateral). Diagnoses of Cervicalgia (Bilateral), Chronic low back pain (Primary Area of Pain) (Bilateral) (L>R) w/ sciatica, Chronic lower extremity pain (Secondary Area of Pain) (Bilateral) (L>R), Chronic upper back pain (Tertiary Area of Pain) (Bilateral) (R>L), Chronic hand pain (Fourth Area of Pain) (Bilateral) (R>L), Chronic pain syndrome, Other intervertebral disc degeneration, lumbar region, Cervical central spinal stenosis, Cervical foraminal stenosis, Cervical myelopathy (HCC), Lumbar central spinal stenosis, Lumbar facet hypertrophy (Bilateral), Lumbar foraminal stenosis, Lumbar radiculopathy, and Lumbar facet syndrome (Bilateral) were also pertinent to this  visit.  Plan of Care  I am having Ebbie Ridge. Burnsworth maintain her losartan-hydrochlorothiazide, diclofenac sodium, ramelteon, tiZANidine, donepezil, omeprazole, Spiriva HandiHaler, buPROPion, mirtazapine, sertraline, QUEtiapine, pantoprazole, albuterol, formoterol, ipratropium, Tylenol PM Extra Strength, doxepin, meloxicam, Anoro Ellipta, Apple Cider Vinegar, DHEA, and pregabalin. We will stop administering lidocaine, lactated ringers, ropivacaine (PF) 2 mg/mL (0.2%), and dexamethasone.  Pharmacotherapy (Medications Ordered): No orders of the defined types were placed in this encounter.  Orders:  Orders Placed This Encounter  Procedures  . LUMBAR FACET(MEDIAL BRANCH NERVE BLOCK) MBNB    Standing Status:   Future    Standing Expiration Date:   05/02/2019    Scheduling Instructions:     Procedure: Lumbar facet block (AKA.: Lumbosacral medial branch nerve block)     Side: Bilateral     Level: L3-4, L4-5, & L5-S1 Facets (L2, L3, L4, L5, & S1 Medial Branch Nerves)     Sedation: Patient's choice.     Timeframe: ASAA    Order Specific Question:   Where will this procedure be performed?    Answer:   [facility]  . MR CERVICAL SPINE WO CONTRAST    In addition to any acute findings, please report on degenerative changes related to: (Please specify level(s)) (1) ROM & instability (>67mm displacement) (2) Facet joint (Zygoapophyseal Joint) (3) DDD and/or IVDD (4) Pars defects (5) Previous surgical changes (Include description  of hardware and hardware status, if present) (6) Presence and degree of spondylolisthesis, spondylosis, and/or spondyloarthropathies)  (7) Old Fractures (8) Demineralization (9) Additional bone pathology (10) Stenosis (Central, Lateral Recess, Foraminal) (11) If at all possible, please provide AP diameter (mm) of foraminal and/or central canal.    Standing Status:   Future    Standing Expiration Date:   07/02/2019    Order Specific Question:   What is the patient's  sedation requirement?    Answer:   No Sedation    Order Specific Question:   Does the patient have a pacemaker or implanted devices?    Answer:   No    Order Specific Question:   Preferred imaging location?    Answer:   [facility] (table limit-350lbs)    Order Specific Question:   Call Results- Best Contact Number?    Answer:   [phone] ([facility])    Order Specific Question:   Radiology Contrast Protocol - do NOT remove file path    Answer:   \\[email or web address]    Order Specific Question:   ** REASON FOR EXAM (FREE TEXT)    Answer:   Neck Pain & Radiculitis  . MR LUMBAR SPINE WO CONTRAST    In addition to any acute findings, please report on degenerative changes related to: (Please specify level(s)) (1) ROM & instability (>70mm displacement) (2) Facet joint (Zygoapophyseal Joint) (3) DDD and/or IVDD (4) Pars defects (5) Previous surgical changes (Include description of hardware and hardware status, if present) (6) Presence and degree of spondylolisthesis, spondylosis, and/or spondyloarthropathies)  (7) Old Fractures (8) Demineralization (9) Additional bone pathology (10) Stenosis (Central, Lateral Recess, Foraminal) (11) If at all possible, please provide AP diameter (mm) of foraminal and/or central canal.    Standing Status:   Future    Standing Expiration Date:   07/02/2019    Order Specific Question:   What is the patient's sedation requirement?    Answer:   No Sedation    Order Specific Question:   Does the patient have a pacemaker or implanted devices?    Answer:   No    Order Specific Question:   Preferred imaging location?    Answer:   [facility] (table limit-350lbs)    Order Specific Question:   Call Results- Best Contact Number?    Answer:   [phone] ([facility])    Order Specific Question:   Radiology Contrast Protocol - do NOT remove file path    Answer:    \\[email or web address]   Follow-up plan:   Return for Procedure (w/ sedation): (B) L-FCT BLK #2.     Considering:   NOTE: SHELLFISH Allergy. Diagnostic midline LESI  Possible bilateral lumbar facet RFA  Possible bilateral thoracic RFA  Diagnostic left IA knee injection    PRN Procedures:   Diagnostic bilateral cervical facet block #2  Diagnostic bilateral Thoracic T7, T8, T9, & T10  Facet Medial Branch block #2 (80/100/100)  Diagnostic/therapeutic left CESI #2 (100/100/100)  Diagnostic/therapeutic bilateral lumbar facet nerve block #2 (100/100/75/>50)     Recent Visits Date Type Provider Dept  03/12/19 Procedure visit Milinda Pointer, MD [facility]  02/16/19 Office Visit Milinda Pointer, MD [facility]  01/08/19 Procedure visit Milinda Pointer, MD [facility]  Showing recent visits within past 90 days and meeting all other requirements   Today's Visits Date Type Provider Dept  04/01/19 Office Visit Milinda Pointer, MD [facility]  Showing today's visits and meeting all other  requirements   Future Appointments No visits were found meeting these conditions.  Showing future appointments within next 90 days and meeting all other requirements   I discussed the assessment and treatment plan with the patient. The patient was provided an opportunity to ask questions and all were answered. The patient agreed with the plan and demonstrated an understanding of the instructions.  Patient advised to call back or seek an in-person evaluation if the symptoms or condition worsens.  Total duration of non-face-to-face encounter: 30 minutes.  Note by: Gaspar Cola, MD Date: 04/01/2019; Time: 3:21 PM  Note: This dictation was prepared with Dragon dictation. Any transcriptional errors that may result from this process are unintentional.  Disclaimer:  * Given the special circumstances of the COVID-19 pandemic,  the federal government has announced that the Office for Civil Rights (OCR) will exercise its enforcement discretion and will not impose penalties on physicians using telehealth in the event of noncompliance with regulatory requirements under the [place] (HIPAA) in connection with the good faith provision of telehealth during the XX123456 national public health emergency. ([place])

## 2019-04-01 NOTE — Patient Instructions (Signed)

## 2019-04-14 ENCOUNTER — Encounter: Payer: Self-pay | Admitting: Pain Medicine

## 2019-04-14 ENCOUNTER — Other Ambulatory Visit: Payer: Self-pay

## 2019-04-14 ENCOUNTER — Ambulatory Visit (HOSPITAL_BASED_OUTPATIENT_CLINIC_OR_DEPARTMENT_OTHER): Payer: Medicare Other | Admitting: Pain Medicine

## 2019-04-14 ENCOUNTER — Ambulatory Visit
Admission: RE | Admit: 2019-04-14 | Discharge: 2019-04-14 | Disposition: A | Discharge status: Home / Self Care | Payer: Medicare Other | Source: Ambulatory Visit | Attending: Pain Medicine | Admitting: Pain Medicine

## 2019-04-14 VITALS — BP 146/98 | HR 98 | Temp 97.9°F | Resp 29 | Ht 66.0 in | Wt 215.8 lb

## 2019-04-14 DIAGNOSIS — M5416 Radiculopathy, lumbar region: Secondary | ICD-10-CM | POA: Insufficient documentation

## 2019-04-14 DIAGNOSIS — M5136 Other intervertebral disc degeneration, lumbar region: Secondary | ICD-10-CM | POA: Diagnosis present

## 2019-04-14 DIAGNOSIS — G8929 Other chronic pain: Secondary | ICD-10-CM | POA: Insufficient documentation

## 2019-04-14 DIAGNOSIS — M48061 Spinal stenosis, lumbar region without neurogenic claudication: Secondary | ICD-10-CM | POA: Diagnosis present

## 2019-04-14 DIAGNOSIS — G959 Disease of spinal cord, unspecified: Secondary | ICD-10-CM | POA: Insufficient documentation

## 2019-04-14 DIAGNOSIS — M5441 Lumbago with sciatica, right side: Secondary | ICD-10-CM | POA: Insufficient documentation

## 2019-04-14 DIAGNOSIS — M47816 Spondylosis without myelopathy or radiculopathy, lumbar region: Secondary | ICD-10-CM

## 2019-04-14 DIAGNOSIS — M5442 Lumbago with sciatica, left side: Secondary | ICD-10-CM | POA: Insufficient documentation

## 2019-04-14 DIAGNOSIS — M47814 Spondylosis without myelopathy or radiculopathy, thoracic region: Secondary | ICD-10-CM

## 2019-04-14 DIAGNOSIS — M542 Cervicalgia: Secondary | ICD-10-CM | POA: Insufficient documentation

## 2019-04-14 DIAGNOSIS — M79604 Pain in right leg: Secondary | ICD-10-CM | POA: Diagnosis present

## 2019-04-14 DIAGNOSIS — M549 Dorsalgia, unspecified: Secondary | ICD-10-CM | POA: Insufficient documentation

## 2019-04-14 DIAGNOSIS — Z91013 Allergy to seafood: Secondary | ICD-10-CM | POA: Diagnosis present

## 2019-04-14 DIAGNOSIS — M79641 Pain in right hand: Secondary | ICD-10-CM | POA: Insufficient documentation

## 2019-04-14 DIAGNOSIS — M79642 Pain in left hand: Secondary | ICD-10-CM | POA: Insufficient documentation

## 2019-04-14 DIAGNOSIS — M79605 Pain in left leg: Secondary | ICD-10-CM | POA: Insufficient documentation

## 2019-04-14 DIAGNOSIS — M4802 Spinal stenosis, cervical region: Secondary | ICD-10-CM | POA: Diagnosis present

## 2019-04-14 DIAGNOSIS — M47812 Spondylosis without myelopathy or radiculopathy, cervical region: Secondary | ICD-10-CM | POA: Insufficient documentation

## 2019-04-14 MED ORDER — MIDAZOLAM HCL 5 MG/5ML IJ SOLN
1.0000 mg | INTRAMUSCULAR | Status: DC | PRN
  Administered 2019-04-14: 2 mg via INTRAVENOUS
  Filled 2019-04-14: qty 5

## 2019-04-14 MED ORDER — LIDOCAINE HCL 2 % IJ SOLN
20.0000 mL | Freq: Once | INTRAMUSCULAR | Status: AC
  Administered 2019-04-14: 10:00:00 400 mg
  Filled 2019-04-14: qty 40

## 2019-04-14 MED ORDER — TRIAMCINOLONE ACETONIDE 40 MG/ML IJ SUSP
80.0000 mg | Freq: Once | INTRAMUSCULAR | Status: AC
  Administered 2019-04-14: 10:00:00 80 mg
  Filled 2019-04-14: qty 2

## 2019-04-14 MED ORDER — ROPIVACAINE HCL 2 MG/ML IJ SOLN
18.0000 mL | Freq: Once | INTRAMUSCULAR | Status: AC
  Administered 2019-04-14: 10:00:00 18 mL via PERINEURAL
  Filled 2019-04-14: qty 20

## 2019-04-14 MED ORDER — FENTANYL CITRATE (PF) 100 MCG/2ML IJ SOLN
25.0000 ug | INTRAMUSCULAR | Status: DC | PRN
  Administered 2019-04-14: 50 ug via INTRAVENOUS
  Filled 2019-04-14: qty 2

## 2019-04-14 MED ORDER — ROPIVACAINE HCL 2 MG/ML IJ SOLN
INTRAMUSCULAR | Status: AC
  Filled 2019-04-14: qty 10

## 2019-04-14 MED ORDER — LACTATED RINGERS IV SOLN
1000.0000 mL | Freq: Once | INTRAVENOUS | Status: AC
  Administered 2019-04-14: 1000 mL via INTRAVENOUS

## 2019-04-14 NOTE — Progress Notes (Signed)
Safety precautions to be maintained throughout the outpatient stay will include: orient to surroundings, keep bed in low position, maintain call bell within reach at all times, provide assistance with transfer out of bed and ambulation.  

## 2019-04-14 NOTE — Patient Instructions (Addendum)

## 2019-04-14 NOTE — Progress Notes (Signed)
Patient's Name: Samantha Macias  MRN: [id number]  Referring Provider: Tracie Harrier, MD  DOB: 08/23/1960  PCP: Tracie Harrier, MD  DOS: 04/14/2019  Note by: Gaspar Cola, MD  Service setting: Ambulatory outpatient  Specialty: Interventional Pain Management  Patient type: Established  Location: [facility] (AMB) Pain Management Facility  Visit type: Interventional Procedure   Primary Reason for Visit: Interventional Pain Management Treatment. CC: Back Pain (lower)  Procedure:          Anesthesia, Analgesia, Anxiolysis:  Type: Lumbar Facet, Medial Branch Block(s) #2  Primary Purpose: Diagnostic Region: Posterolateral Lumbosacral Spine Level: L2, L3, L4, L5, & S1 Medial Branch Level(s). Injecting these levels blocks the L3-4, L4-5, and L5-S1 lumbar facet joints. Laterality: Bilateral  Type: Moderate (Conscious) Sedation combined with Local Anesthesia Indication(s): Analgesia and Anxiety Route: Intravenous (IV) IV Access: Secured Sedation: Meaningful verbal contact was maintained at all times during the procedure  Local Anesthetic: Lidocaine 1-2%  Position: Prone   Indications: 1. Lumbar facet syndrome (Bilateral)   2. Spondylosis without myelopathy or radiculopathy, thoracic region   3. Lumbar facet hypertrophy (Bilateral)   4. DDD (degenerative disc disease), lumbar   5. Chronic low back pain (Primary Area of Pain) (Bilateral) (L>R) w/ sciatica   6. History of allergy to shellfish    Pain Score: Pre-procedure: 0-No pain/10 Post-procedure: 0-No pain/10   Pre-op Assessment:  Samantha Macias is a 58 y.o. (year old), female patient, seen today for interventional treatment. She  has a past surgical history that includes Tubal ligation; Nasal sinus surgery; Colonoscopy with propofol (N/A, 04/01/2017); Esophagogastroduodenoscopy (egd) with propofol (N/A, 04/01/2017); and Mouth surgery. Samantha Macias has a current medication list which includes the following prescription(s): albuterol,  apple cider vinegar, bupropion, dhea, diclofenac sodium, tylenol pm extra strength, donepezil, doxepin, formoterol, ipratropium, losartan-hydrochlorothiazide, meloxicam, mirtazapine, omeprazole, pantoprazole, pregabalin, quetiapine, ramelteon, sertraline, spiriva handihaler, tizanidine, and anoro ellipta, and the following Facility-Administered Medications: fentanyl and midazolam. Her primarily concern today is the Back Pain (lower)  Initial Vital Signs:  Pulse/HCG Rate: 98ECG Heart Rate: 96 Temp: (!) 97.3 F (36.3 C) Resp: 16 BP: (!) 139/100(patient states she took BP meds about 23min ago., MD aware) SpO2: 99 %  BMI: Estimated body mass index is 34.83 kg/m as calculated from the following:   Height as of this encounter: 5\' 6"  (1.676 m).   Weight as of this encounter: 215 lb 12.8 oz (97.9 kg).  Risk Assessment: Allergies: Reviewed. She is allergic to contrast media [iodinated diagnostic agents]; shellfish allergy; tetanus toxoid adsorbed; ace inhibitors; codeine; nitrofurantoin monohyd macro; and red dye.  Allergy Precautions: None required Coagulopathies: Reviewed. None identified.  Blood-thinner therapy: None at this time Active Infection(s): Reviewed. None identified. Samantha Macias is afebrile  Site Confirmation: Samantha Macias was asked to confirm the procedure and laterality before marking the site Procedure checklist: Completed Consent: Before the procedure and under the influence of no sedative(s), amnesic(s), or anxiolytics, the patient was informed of the treatment options, risks and possible complications. To fulfill our ethical and legal obligations, as recommended by the American Medical Association's Code of Ethics, I have informed the patient of my clinical impression; the nature and purpose of the treatment or procedure; the risks, benefits, and possible complications of the intervention; the alternatives, including doing nothing; the risk(s) and benefit(s) of the alternative  treatment(s) or procedure(s); and the risk(s) and benefit(s) of doing nothing. The patient was provided information about the general risks and possible complications associated with the procedure. These may include,  but are not limited to: failure to achieve desired goals, infection, bleeding, organ or nerve damage, allergic reactions, paralysis, and death. In addition, the patient was informed of those risks and complications associated to Spine-related procedures, such as failure to decrease pain; infection (i.e.: Meningitis, epidural or intraspinal abscess); bleeding (i.e.: epidural hematoma, subarachnoid hemorrhage, or any other type of intraspinal or peri-dural bleeding); organ or nerve damage (i.e.: Any type of peripheral nerve, nerve root, or spinal cord injury) with subsequent damage to sensory, motor, and/or autonomic systems, resulting in permanent pain, numbness, and/or weakness of one or several areas of the body; allergic reactions; (i.e.: anaphylactic reaction); and/or death. Furthermore, the patient was informed of those risks and complications associated with the medications. These include, but are not limited to: allergic reactions (i.e.: anaphylactic or anaphylactoid reaction(s)); adrenal axis suppression; blood sugar elevation that in diabetics may result in ketoacidosis or comma; water retention that in patients with history of congestive heart failure may result in shortness of breath, pulmonary edema, and decompensation with resultant heart failure; weight gain; swelling or edema; medication-induced neural toxicity; particulate matter embolism and blood vessel occlusion with resultant organ, and/or nervous system infarction; and/or aseptic necrosis of one or more joints. Finally, the patient was informed that Medicine is not an exact science; therefore, there is also the possibility of unforeseen or unpredictable risks and/or possible complications that may result in a catastrophic  outcome. The patient indicated having understood very clearly. We have given the patient no guarantees and we have made no promises. Enough time was given to the patient to ask questions, all of which were answered to the patient's satisfaction. Ms. Alonso has indicated that she wanted to continue with the procedure. Attestation: I, the ordering provider, attest that I have discussed with the patient the benefits, risks, side-effects, alternatives, likelihood of achieving goals, and potential problems during recovery for the procedure that I have provided informed consent. Date   Time: 04/14/2019  9:23 AM  Pre-Procedure Preparation:  Monitoring: As per clinic protocol. Respiration, ETCO2, SpO2, BP, heart rate and rhythm monitor placed and checked for adequate function Safety Precautions: Patient was assessed for positional comfort and pressure points before starting the procedure. Time-out: I initiated and conducted the "Time-out" before starting the procedure, as per protocol. The patient was asked to participate by confirming the accuracy of the "Time Out" information. Verification of the correct person, site, and procedure were performed and confirmed by me, the nursing staff, and the patient. "Time-out" conducted as per Joint Commission's Universal Protocol (UP.01.01.01). Time: 1031  Description of Procedure:          Laterality: Bilateral. The procedure was performed in identical fashion on both sides. Levels:  L2, L3, L4, L5, & S1 Medial Branch Level(s) Area Prepped: Posterior Lumbosacral Region Prepping solution: DuraPrep (Iodine Povacrylex [0.7% available iodine] and Isopropyl Alcohol, 74% w/w) Safety Precautions: Aspiration looking for blood return was conducted prior to all injections. At no point did we inject any substances, as a needle was being advanced. Before injecting, the patient was told to immediately notify me if she was experiencing any new onset of "ringing in the ears, or  metallic taste in the mouth". No attempts were made at seeking any paresthesias. Safe injection practices and needle disposal techniques used. Medications properly checked for expiration dates. SDV (single dose vial) medications used. After the completion of the procedure, all disposable equipment used was discarded in the proper designated medical waste containers. Local Anesthesia: Protocol guidelines were followed.  The patient was positioned over the fluoroscopy table. The area was prepped in the usual manner. The time-out was completed. The target area was identified using fluoroscopy. A 12-in long, straight, sterile hemostat was used with fluoroscopic guidance to locate the targets for each level blocked. Once located, the skin was marked with an approved surgical skin marker. Once all sites were marked, the skin (epidermis, dermis, and hypodermis), as well as deeper tissues (fat, connective tissue and muscle) were infiltrated with a small amount of a short-acting local anesthetic, loaded on a 10cc syringe with a 25G, 1.5-in  Needle. An appropriate amount of time was allowed for local anesthetics to take effect before proceeding to the next step. Local Anesthetic: Lidocaine 2.0% The unused portion of the local anesthetic was discarded in the proper designated containers. Technical explanation of process:  L2 Medial Branch Nerve Block (MBB): The target area for the L2 medial branch is at the junction of the postero-lateral aspect of the superior articular process and the superior, posterior, and medial edge of the transverse process of L3. Under fluoroscopic guidance, a Quincke needle was inserted until contact was made with os over the superior postero-lateral aspect of the pedicular shadow (target area). After negative aspiration for blood, 0.5 mL of the nerve block solution was injected without difficulty or complication. The needle was removed intact. L3 Medial Branch Nerve Block (MBB): The target area  for the L3 medial branch is at the junction of the postero-lateral aspect of the superior articular process and the superior, posterior, and medial edge of the transverse process of L4. Under fluoroscopic guidance, a Quincke needle was inserted until contact was made with os over the superior postero-lateral aspect of the pedicular shadow (target area). After negative aspiration for blood, 0.5 mL of the nerve block solution was injected without difficulty or complication. The needle was removed intact. L4 Medial Branch Nerve Block (MBB): The target area for the L4 medial branch is at the junction of the postero-lateral aspect of the superior articular process and the superior, posterior, and medial edge of the transverse process of L5. Under fluoroscopic guidance, a Quincke needle was inserted until contact was made with os over the superior postero-lateral aspect of the pedicular shadow (target area). After negative aspiration for blood, 0.5 mL of the nerve block solution was injected without difficulty or complication. The needle was removed intact. L5 Medial Branch Nerve Block (MBB): The target area for the L5 medial branch is at the junction of the postero-lateral aspect of the superior articular process and the superior, posterior, and medial edge of the sacral ala. Under fluoroscopic guidance, a Quincke needle was inserted until contact was made with os over the superior postero-lateral aspect of the pedicular shadow (target area). After negative aspiration for blood, 0.5 mL of the nerve block solution was injected without difficulty or complication. The needle was removed intact. S1 Medial Branch Nerve Block (MBB): The target area for the S1 medial branch is at the posterior and inferior 6 o'clock position of the L5-S1 facet joint. Under fluoroscopic guidance, the Quincke needle inserted for the L5 MBB was redirected until contact was made with os over the inferior and postero aspect of the sacrum, at the  6 o' clock position under the L5-S1 facet joint (Target area). After negative aspiration for blood, 0.5 mL of the nerve block solution was injected without difficulty or complication. The needle was removed intact.  Nerve block solution: 0.2% PF-Ropivacaine + Triamcinolone (40 mg/mL) diluted to a  final concentration of 4 mg of Triamcinolone/mL of Ropivacaine The unused portion of the solution was discarded in the proper designated containers. Procedural Needles: 22-gauge, 3.5-inch, Quincke needles used for all levels.  Once the entire procedure was completed, the treated area was cleaned, making sure to leave some of the prepping solution back to take advantage of its long term bactericidal properties.   Illustration of the posterior view of the lumbar spine and the posterior neural structures. Laminae of L2 through S1 are labeled. DPRL5, dorsal primary ramus of L5; DPRS1, dorsal primary ramus of S1; DPR3, dorsal primary ramus of L3; FJ, facet (zygapophyseal) joint L3-L4; I, inferior articular process of L4; LB1, lateral branch of dorsal primary ramus of L1; IAB, inferior articular branches from L3 medial branch (supplies L4-L5 facet joint); IBP, intermediate branch plexus; MB3, medial branch of dorsal primary ramus of L3; NR3, third lumbar nerve root; S, superior articular process of L5; SAB, superior articular branches from L4 (supplies L4-5 facet joint also); TP3, transverse process of L3.  Vitals:   04/14/19 1042 04/14/19 1052 04/14/19 1102 04/14/19 1112  BP: (!) 146/105 (!) 150/92 127/75 (!) 146/98  Pulse:      Resp: 15 16 20  (!) 29  Temp:  97.9 F (36.6 C)    SpO2: 96% 97% 98% 99%  Weight:      Height:         Start Time: 1031 hrs. End Time: 1040 hrs.  Imaging Guidance (Spinal):          Type of Imaging Technique: Fluoroscopy Guidance (Spinal) Indication(s): Assistance in needle guidance and placement for procedures requiring needle placement in or near specific anatomical locations  not easily accessible without such assistance. Exposure Time: Please see nurses notes. Contrast: None used. Fluoroscopic Guidance: I was personally present during the use of fluoroscopy. "Tunnel Vision Technique" used to obtain the best possible view of the target area. Parallax error corrected before commencing the procedure. "Direction-depth-direction" technique used to introduce the needle under continuous pulsed fluoroscopy. Once target was reached, antero-posterior, oblique, and lateral fluoroscopic projection used confirm needle placement in all planes. Images permanently stored in EMR. Interpretation: No contrast injected. I personally interpreted the imaging intraoperatively. Adequate needle placement confirmed in multiple planes. Permanent images saved into the patient's record.  Antibiotic Prophylaxis:   Anti-infectives (From admission, onward)   None     Indication(s): None identified  Post-operative Assessment:  Post-procedure Vital Signs:  Pulse/HCG Rate: 9881 Temp: 97.9 F (36.6 C) Resp: (!) 29 BP: (!) 146/98 SpO2: 99 %  EBL: None  Complications: No immediate post-treatment complications observed by team, or reported by patient.  Note: The patient tolerated the entire procedure well. A repeat set of vitals were taken after the procedure and the patient was kept under observation following institutional policy, for this type of procedure. Post-procedural neurological assessment was performed, showing return to baseline, prior to discharge. The patient was provided with post-procedure discharge instructions, including a section on how to identify potential problems. Should any problems arise concerning this procedure, the patient was given instructions to immediately contact us, at any time, without hesitation. In any case, we plan to contact the patient by telephone for a follow-up status report regarding this interventional procedure.  Comments:  No additional relevant  information.  Plan of Care  Orders:  Orders Placed This Encounter  Procedures   L-FCT Blk (Today)    Scheduling Instructions:     Procedure: Lumbar facet block (AKA.: Lumbosacral medial branch nerve  block)     Side: Bilateral     Level: L3-4, L4-5, & L5-S1 Facets (L2, L3, L4, L5, & S1 Medial Branch Nerves)     Sedation: Patient's choice.     Timeframe: Today    Order Specific Question:   Where will this procedure be performed?    Answer:   [facility]   Fluoro (C-Arm) (<60 min) (No Report)    Intraoperative interpretation by procedural physician at [place].    Standing Status:   Standing    Number of Occurrences:   1    Order Specific Question:   Reason for exam:    Answer:   Assistance in needle guidance and placement for procedures requiring needle placement in or near specific anatomical locations not easily accessible without such assistance.   Consent: L-FCT BLK    Nursing Order: Transcribe to consent form and obtain patient signature. Note: Always confirm laterality of pain with [name], before procedure. Procedure: Lumbar Facet Block  under fluoroscopic guidance Indication/Reason: Low Back Pain, with our without leg pain, due to Facet Joint Arthralgia (Joint Pain) known as Lumbar Facet Syndrome, secondary to Lumbar, and/or Lumbosacral Spondylosis (Arthritis of the Spine), without myelopathy or radiculopathy (Nerve Damage). Provider Attestation: I, [place] Dossie Arbour, MD, (Pain Management Specialist), the physician/practitioner, attest that I have discussed with the patient the benefits, risks, side effects, alternatives, likelihood of achieving goals and potential problems during recovery for the procedure that I have provided informed consent.   Block Tray    Equipment required: Single use, disposable, "Block Tray"    Standing Status:   Standing    Number of Occurrences:   1    Order Specific Question:   Specify    Answer:   Block Tray    Allergy: IODINE / Shellfish    NOTE: Although It is true that patients can have allergies to shellfish and that shellfish contain iodine, most shellfish  allergies are due to two protein allergens present in the shellfish: tropomyosins and parvalbumin. Not all patients with shellfish allergies are allergic to iodine. However, as a precaution, avoid using iodine containing products.    Standing Status:   Standing    Number of Occurrences:   1   Chronic Opioid Analgesic:  None provided by our practice. NO OPIOIDS: UDS (08/07/17) (+) for Unreported Benzoylecgonine, a metabolite of cocaine; its presence    indicates use of this drug.  Source is most commonly illicit.   Medications ordered for procedure: Meds ordered this encounter  Medications   lidocaine (XYLOCAINE) 2 % (with pres) injection 400 mg   lactated ringers infusion 1,000 mL   midazolam (VERSED) 5 MG/5ML injection 1-2 mg    Make sure Flumazenil is available in the pyxis when using this medication. If oversedation occurs, administer 0.2 mg IV over 15 sec. If after 45 sec no response, administer 0.2 mg again over 1 min; may repeat at 1 min intervals; not to exceed 4 doses (1 mg)   fentaNYL (SUBLIMAZE) injection 25-50 mcg    Make sure Narcan is available in the pyxis when using this medication. In the event of respiratory depression (RR< 8/min): Titrate NARCAN (naloxone) in increments of 0.1 to 0.2 mg IV at 2-3 minute intervals, until desired degree of reversal.   ropivacaine (PF) 2 mg/mL (0.2%) (NAROPIN) injection 18 mL   triamcinolone acetonide (KENALOG-40) injection 80 mg   Medications administered: We administered lidocaine, lactated ringers, midazolam, fentaNYL, ropivacaine (PF) 2 mg/mL (0.2%), and  triamcinolone acetonide.  See the medical record for exact dosing, route, and time of administration.  Follow-up plan:   Return in about 2 weeks (around 04/28/2019) for (VV), (PP).       Considering:   NOTE: SHELLFISH  Allergy. Diagnostic midline LESI  Possible bilateral lumbar facet RFA  Possible bilateral thoracic RFA  Diagnostic left IA knee injection    PRN Procedures:   Diagnostic bilateral cervical facet block #2  Diagnostic bilateral Thoracic T7, T8, T9, & T10  Facet Medial Branch block #2 (80/100/100)  Diagnostic/therapeutic left CESI #2 (100/100/100)  Diagnostic/therapeutic bilateral lumbar facet nerve block #2 (100/100/75/>50)      Recent Visits Date Type Provider Dept  04/01/19 Office Visit Milinda Pointer, MD [facility]  03/12/19 Procedure visit Milinda Pointer, MD [facility]  02/16/19 Office Visit Milinda Pointer, MD [facility]  Showing recent visits within past 90 days and meeting all other requirements   Today's Visits Date Type Provider Dept  04/14/19 Procedure visit Milinda Pointer, MD [facility]  Showing today's visits and meeting all other requirements   Future Appointments Date Type Provider Dept  04/27/19 Appointment Milinda Pointer, MD [facility]  Showing future appointments within next 90 days and meeting all other requirements   Disposition: Discharge home  Discharge Date & Time: 04/14/2019; 1112 hrs.   Primary Care Physician: Tracie Harrier, MD Location: [facility] Note by: Gaspar Cola, MD Date: 04/14/2019; Time: 11:22 AM  Disclaimer:  Medicine is not an Chief Strategy Officer. The only guarantee in medicine is that nothing is guaranteed. It is important to note that the decision to proceed with this intervention was based on the information collected from the patient. The Data and conclusions were drawn from the patient's questionnaire, the interview, and the physical examination. Because the information was provided in large part by the patient, it cannot be guaranteed that it has not been purposely or unconsciously manipulated. Every effort has been made to obtain  as much relevant data as possible for this evaluation. It is important to note that the conclusions that lead to this procedure are derived in large part from the available data. Always take into account that the treatment will also be dependent on availability of resources and existing treatment guidelines, considered by other Pain Management Practitioners as being common knowledge and practice, at the time of the intervention. For Medico-Legal purposes, it is also important to point out that variation in procedural techniques and pharmacological choices are the acceptable norm. The indications, contraindications, technique, and results of the above procedure should only be interpreted and judged by a Board-Certified Interventional Pain Specialist with extensive familiarity and expertise in the same exact procedure and technique.

## 2019-04-15 ENCOUNTER — Ambulatory Visit
Admission: RE | Admit: 2019-04-15 | Discharge: 2019-04-15 | Disposition: A | Discharge status: Home / Self Care | Payer: Medicare Other | Source: Ambulatory Visit | Attending: Pain Medicine | Admitting: Pain Medicine

## 2019-04-15 ENCOUNTER — Telehealth: Payer: Self-pay

## 2019-04-15 DIAGNOSIS — M542 Cervicalgia: Secondary | ICD-10-CM | POA: Diagnosis not present

## 2019-04-15 DIAGNOSIS — M549 Dorsalgia, unspecified: Secondary | ICD-10-CM

## 2019-04-15 DIAGNOSIS — M47816 Spondylosis without myelopathy or radiculopathy, lumbar region: Secondary | ICD-10-CM

## 2019-04-15 DIAGNOSIS — M4802 Spinal stenosis, cervical region: Secondary | ICD-10-CM

## 2019-04-15 DIAGNOSIS — M48061 Spinal stenosis, lumbar region without neurogenic claudication: Secondary | ICD-10-CM

## 2019-04-15 DIAGNOSIS — M79641 Pain in right hand: Secondary | ICD-10-CM

## 2019-04-15 DIAGNOSIS — M5416 Radiculopathy, lumbar region: Secondary | ICD-10-CM

## 2019-04-15 DIAGNOSIS — G8929 Other chronic pain: Secondary | ICD-10-CM

## 2019-04-15 DIAGNOSIS — G959 Disease of spinal cord, unspecified: Secondary | ICD-10-CM

## 2019-04-15 DIAGNOSIS — M79642 Pain in left hand: Secondary | ICD-10-CM

## 2019-04-15 DIAGNOSIS — M47812 Spondylosis without myelopathy or radiculopathy, cervical region: Secondary | ICD-10-CM

## 2019-04-15 DIAGNOSIS — M5136 Other intervertebral disc degeneration, lumbar region: Secondary | ICD-10-CM

## 2019-04-15 DIAGNOSIS — M79605 Pain in left leg: Secondary | ICD-10-CM

## 2019-04-15 NOTE — Telephone Encounter (Signed)
Post procedure phone call.  LM 

## 2019-04-23 ENCOUNTER — Telehealth: Payer: Self-pay | Admitting: *Deleted

## 2019-04-23 NOTE — Telephone Encounter (Signed)
Attempted to call for pre appointment assessment. Message left. 

## 2019-04-26 NOTE — Progress Notes (Signed)
Pain Management Virtual Encounter Note - Virtual Visit via Telephone Telehealth (real-time audio visits between healthcare provider and patient).   Patient's Phone No. & Preferred Pharmacy:  [phone] (home); [phone] (mobile); (Preferred) [phone] No e-mail address on record  [place], [place] [place] [address] Phone: [phone] Fax: [phone]    Pre-screening note:  Our staff contacted Ms. Crunkleton and offered her an "in person", "face-to-face" appointment versus a telephone encounter. She indicated preferring the telephone encounter, at this time.   Reason for Virtual Visit: COVID-19*  Social distancing based on CDC and AMA recommendations.   I contacted [name] on 04/27/2019 via telephone.      I clearly identified myself as Gaspar Cola, MD. I verified that I was speaking with the correct person using two identifiers (Name: Shawanna Wehner, and date of birth: 04-29-1961).  Advanced Informed Consent I sought verbal advanced consent from [name] for virtual visit interactions. I informed Ms. Sharifi of possible security and privacy concerns, risks, and limitations associated with providing "not-in-person" medical evaluation and management services. I also informed Ms. Geffert of the availability of "in-person" appointments. Finally, I informed her that there would be a charge for the virtual visit and that she could be  personally, fully or partially, financially responsible for it. Ms. Meager expressed understanding and agreed to proceed.   Historic Elements   Ms. Shaundria Allegra is a 58 y.o. year old, female patient evaluated today after her last encounter by our practice on 04/23/2019. Ms. Vanhuss  has a past medical history of Abnormal gait, Allergy, Anxiety, Arthritis, Back pain, Bipolar disorder ([place]), CAD (coronary artery disease), Chronic kidney disease, Collagen vascular disease  ([place]), Colon polyp, COPD (chronic obstructive pulmonary disease) ([place]), Falls, Fibromyalgia, GERD (gastroesophageal reflux disease), Hyperlipidemia, Hypertension, IBS (irritable bowel syndrome), Kidney disease, Migraine, Numbness and tingling, Renal insufficiency, Spinal stenosis, Static encephalopathy, Thyroid nodule (07/2017), and TIA (transient ischemic attack). She also  has a past surgical history that includes Tubal ligation; Nasal sinus surgery; Colonoscopy with propofol (N/A, 04/01/2017); Esophagogastroduodenoscopy (egd) with propofol (N/A, 04/01/2017); and Mouth surgery. Ms. Tkaczyk has a current medication list which includes the following prescription(s): albuterol, apple cider vinegar, bupropion, dhea, diclofenac sodium, tylenol pm extra strength, donepezil, doxepin, formoterol, ipratropium, losartan-hydrochlorothiazide, meloxicam, mirtazapine, omeprazole, pantoprazole, pregabalin, quetiapine, ramelteon, sertraline, spiriva handihaler, tizanidine, and anoro ellipta. She  reports that she has been smoking cigarettes. She has a 6.25 pack-year smoking history. She has never used smokeless tobacco. She reports current drug use. Drug: Cocaine. She reports that she does not drink alcohol. Ms. Steve is allergic to contrast media [iodinated diagnostic agents]; shellfish allergy; tetanus toxoid adsorbed; ace inhibitors; codeine; nitrofurantoin monohyd macro; and red dye.   HPI  Today, she is being contacted for a post-procedure assessment.  Today I spent quite a bit of time with this patient going over both of her MRIs.  Clearly there is no quick fix to what is going on with her and the most I can do is to start working systematically towards getting some of this pain better.  However, some of these problems may eventually need to be dealt with by a neurosurgeon.  We will start by doing radiofrequency ablation of the lumbar facets and then we will see what remains in that area.  Once we get this under control,  will move onto the cervical region.  MRIs have confirmed the existence of lumbar facet hypertrophy,  described to be severe at several levels.  She has been getting excellent relief of the pain for the duration of the local anesthetic, but no long-term benefit from the steroids suggesting that the pain is more mechanical and therefore we need to work with the radiofrequency ablation to bring some of that down.  Today we went over the treatment plan starting with the radiofrequency ablation of the lumbar facets and the patient has understood and agreed with it.  Post-Procedure Evaluation  Procedure: Diagnostic bilateral lumbar facet block #2 under fluoroscopic guidance and IV sedation Pre-procedure pain level:  0/10 Post-procedure: 0/10 (100% relief)  Sedation: Sedation provided.  Janett Billow, RN  04/27/2019  2:20 PM  Sign when Signing Visit Pain relief after procedure (treated area only): (Questions asked to patient) 1. Starting about 15 minutes after the procedure, and "while the area was still numb" (from the local anesthetics), were you having any of your usual pain "in that area"? (NOT including the discomfort from the needle sticks.) First 1 hour: 100 % better. First 4-6 hours: 100 % better. 2. Assuming that it did get numb. How long was the area numb? 100 % benefit, longer than 6 hours. How long? 2 days. 3. How much better is your pain now, when compared to before the procedure? Current benefit: 0 % better. 4. Can you move better now? Improvement in ROM (Range of Motion): No. 5. Can you do more now? Improvement in function: No. 4. Did you have any problems with the procedure? Side-effects/Complications: No.   Current benefits: Defined as benefit that persist at this time.   Analgesia:  Back to baseline Function: Back to baseline ROM: Back to baseline  Medical Necessity: Ms. Rahill has experienced debilitating chronic pain from the Lumbosacral Facet Syndrome (Spondylosis  without myelopathy or radiculopathy, lumbosacral region [M47.817]) that has persisted for longer than three months of failed non-surgical care and has either failed to respond, or was unable to tolerate, or simply did not get enough benefit from other more conservative therapies including, but not limited to: 1. Over-the-counter oral analgesic medications (i.e.: ibuprofen, naproxen, etc.) 2. Anti-inflammatory medications 3. Muscle relaxants 4. Membrane stabilizers 5. Opioids 6. Physical therapy (PT), chiropractic manipulation, and/or home exercise program (HEP). 7. Modalities (Heat, ice, etc.) 8. Invasive techniques such as nerve blocks.  Ms. Wegrzyn has attained greater than 50% reduction in pain from at least two (2) diagnostic medial branch blocks conducted in separate occasions. For this reason, I believe it is medically necessary to proceed with Non-Pulsed Radiofrequency Ablation for the purpose of attempting to prolong the duration of the benefits seen with the diagnostic injections.  Pharmacotherapy Assessment  Analgesic: None provided by our practice. NO OPIOIDS: UDS (08/07/17) (+) for Unreported Benzoylecgonine, a metabolite of cocaine; its presence    indicates use of this drug.  Source is most commonly illicit.   Monitoring: Pharmacotherapy: No side-effects or adverse reactions reported.  PMP: PDMP reviewed during this encounter.       Compliance: No problems identified. Effectiveness: Clinically acceptable. Plan: Refer to "POC".  UDS:  Summary  Date Value Ref Range Status  03/17/2018 FINAL  Final    Comment:    ==================================================================== TOXASSURE COMP DRUG ANALYSIS,UR ==================================================================== Test                             Result       Flag       Units Drug Present and  Declared for Prescription Verification   Tramadol                       1825         EXPECTED   ng/mg creat    O-Desmethyltramadol            929          EXPECTED   ng/mg creat   N-Desmethyltramadol            1352         EXPECTED   ng/mg creat    Source of tramadol is a prescription medication.    O-desmethyltramadol and N-desmethyltramadol are expected    metabolites of tramadol.   Pregabalin                     PRESENT      EXPECTED   Tizanidine                     PRESENT      EXPECTED   Bupropion                      PRESENT      EXPECTED   Hydroxybupropion               PRESENT      EXPECTED    Hydroxybupropion is an expected metabolite of bupropion.   Mirtazapine                    PRESENT      EXPECTED   Quetiapine                     PRESENT      EXPECTED   Salicylate                     PRESENT      EXPECTED Drug Present not Declared for Prescription Verification   Benzoylecgonine                494          UNEXPECTED ng/mg creat    Benzoylecgonine is a metabolite of cocaine; its presence    indicates use of this drug.  Source is most commonly illicit, but    cocaine is present in some topical anesthetic solutions.   Acetaminophen                  PRESENT      UNEXPECTED   Diphenhydramine                PRESENT      UNEXPECTED Drug Absent but Declared for Prescription Verification   Alprazolam                     Not Detected UNEXPECTED ng/mg creat   Sertraline                     Not Detected UNEXPECTED   Diclofenac                     Not Detected UNEXPECTED    Topical diclofenac, as indicated in the declared medication list,    is not always detected even when used as directed.   Ibuprofen  Not Detected UNEXPECTED    Ibuprofen, as indicated in the declared medication list, is not    always detected even when used as directed. ==================================================================== Test                      Result    Flag   Units      Ref Range   Creatinine              159              mg/dL       >=20 ==================================================================== Declared Medications:  The flagging and interpretation on this report are based on the  following declared medications.  Unexpected results may arise from  inaccuracies in the declared medications.  **Note: The testing scope of this panel includes these medications:  Alprazolam (Xanax)  Bupropion (Wellbutrin)  Mirtazapine (Remeron)  Pregabalin (Lyrica)  Quetiapine (Seroquel)  Sertraline (Zoloft)  Tramadol (Ultram)  **Note: The testing scope of this panel does not include small to  moderate amounts of these reported medications:  Aspirin (Aspirin 81)  Ibuprofen  Tizanidine (Zanaflex)  Topical Diclofenac  **Note: The testing scope of this panel does not include following  reported medications:  Albuterol  Donepezil (Aricept)  Formoterol (Perforomist)  Hydrochlorothiazide (Hyzaar)  Ipratropium  Losartan (Hyzaar)  Omeprazole (Prilosec)  Pantoprazole (Protonix)  Ramelteon (Rozerem)  Tiotropium (Spiriva)  Vitamin D ==================================================================== For clinical consultation, please call [phone]. ====================================================================    Laboratory Chemistry Profile (12 mo)  Renal: No results found for requested labs within last 8760 hours.  Lab Results  Component Value Date   GFRAA 51 (L) 03/17/2018   GFRNONAA 44 (L) 03/17/2018   Hepatic: No results found for requested labs within last 8760 hours. Lab Results  Component Value Date   AST 13 03/17/2018   ALT 15 12/24/2017   Other: No results found for requested labs within last 8760 hours. Note: Above Lab results reviewed.  Imaging  Last 90 days:  Mr Cervical Spine Wo Contrast  Result Date: 04/16/2019 CLINICAL DATA:  Worsening chronic neck, shoulder and arm pain, more severe on the left. EXAM: MRI CERVICAL SPINE WITHOUT CONTRAST TECHNIQUE: Multiplanar, multisequence MR  imaging of the cervical spine was performed. No intravenous contrast was administered. COMPARISON:  MRI lumbar spine 05/17/2017. FINDINGS: Alignment: 0.3 cm anterolisthesis C3 on C4 and C4 on C5, unchanged. Trace anterolisthesis C7 on T1 is also unchanged. Vertebrae: No fracture, evidence of discitis, or bone lesion. Cord: Normal signal throughout. Posterior Fossa, vertebral arteries, paraspinal tissues: Negative. Disc levels: C2-3: Severe right and mild-to-moderate left facet degenerative disease appears unchanged. There is uncovertebral spurring on the right. Moderate to moderately severe right foraminal narrowing is unchanged. The left foramen is open. The left foramen and central canal are open. C3-4: Right worse than left facet degenerative disease appears unchanged. The disc is uncovered with a shallow bulge but the central canal is open. Moderate foraminal narrowing is worse on the right. No change compared to the prior MRI. C4-5: Severe facet degenerative disease on the left is again seen. The disc is uncovered with a shallow bulge and there is left worse than right uncovertebral spurring. Severe left and mild-to-moderate right foraminal narrowing is seen. The central canal is open. No change. C5-6: Right worse than left facet degenerative change with mild posterior bony ridging and some uncovertebral disease. Moderate right foraminal narrowing is unchanged. The central canal and left foramen are open. C6-7: Disc osteophyte complex and  uncovertebral disease are seen. There is mild-to-moderate bilateral facet degenerative change. Moderately severe to severe bilateral foraminal narrowing is seen. The ventral cord is slightly flattened. No change compared to the prior MRI. C7-T1: Bilateral facet arthropathy.  No stenosis. IMPRESSION: No marked change in the appearance of the cervical spine since the comparison MRI. Multilevel foraminal narrowing due to advanced facet arthropathy and uncovertebral spurring is  moderate to moderately severe on the right at C2-3, moderate on the right at C3-4, severe on the left at C4-5 and moderate on the right at C5-6. No change in mild flattening of the ventral cord and moderately severe to severe bilateral foraminal narrowing at C6-7. Electronically Signed   By: Inge Rise M.D.   On: 04/16/2019 06:45   Mr Lumbar Spine Wo Contrast  Result Date: 04/16/2019 CLINICAL DATA:  Worsening central and left low back pain for 1 year. Left worse than right hip and leg pain. EXAM: MRI LUMBAR SPINE WITHOUT CONTRAST TECHNIQUE: Multiplanar, multisequence MR imaging of the lumbar spine was performed. No intravenous contrast was administered. COMPARISON:  MRI lumbar spine 03/15/2016. FINDINGS: Segmentation:  Standard. Alignment:  Trace retrolisthesis L5 on S1 is unchanged. Vertebrae: No fracture, evidence of discitis, or bone lesion. Mild degenerative endplate signal change posteriorly at L1-2 and to the left at L5-S1 is unchanged. Conus medullaris and cauda equina: Conus extends to the L1 level. Conus and cauda equina appear normal. Paraspinal and other soft tissues: Small left renal cyst noted. Disc levels: T12-L1: There is a shallow left paracentral protrusion with cephalad extension which is new since the prior MRI. The disc mildly indents the ventral thecal sac. No nerve root compression is identified. The foramina are widely patent. L1-2: Shallow right paracentral protrusion without central canal or foraminal stenosis, unchanged. L2-3: Moderate facet arthropathy. Small annular fissure in the left foramen is identified. The central canal and foramina remain open. No change. L3-4: Negative. L4-5: Bilateral facet degenerative disease is worse on the right and shows some progression since the prior examination. Mild marrow edema in the L4 and L5 pedicles is consistent with secondary stress change. There is a shallow broad-based disc bulge and some ligamentum flavum thickening. There is mild  to moderate central canal stenosis and mild narrowing in the subarticular recesses. Mild bilateral foraminal narrowing is also present. L5-S1: Moderate bilateral facet degenerative disease is more advanced on the left. There is a shallow disc bulge eccentric to the left. Moderate left foraminal narrowing is unchanged. The central canal and right foramen are open. IMPRESSION: There has been some progression of spondylosis at L4-5 since the prior MRI where there is now mild to moderate central canal stenosis and mild narrowing in the subarticular recesses and foramina bilaterally. Facet arthropathy at L4-5 has also progressed with new very mild marrow edema in the L4 and L5 pedicles consistent with secondary stress change. New shallow left paracentral protrusion with cephalad extension at T12-L1 indents the thecal sac in the left lateral recess but no nerve root compression is seen. No change in moderate left foraminal narrowing at L5-S1 due to disc and facet degenerative disease. Electronically Signed   By: Inge Rise M.D.   On: 04/16/2019 06:53   Fluoro (c-arm) (<60 Min) (no Report)  Result Date: 04/14/2019 Fluoro was used, but no Radiologist interpretation will be provided. Please refer to "NOTES" tab for provider progress note.  [facility] C-arm 1-60 Min No Report  Result Date: 03/12/2019 Fluoro was used, but no Radiologist interpretation will be  provided. Please refer to "NOTES" tab for provider progress note.  Assessment  The primary encounter diagnosis was Chronic low back pain (Bilateral) (L>R) w/o sciatica. Diagnoses of Lumbar facet syndrome (Bilateral), Lumbar facet hypertrophy (Bilateral), and Abnormal MRI, lumbar spine were also pertinent to this visit.  Plan of Care  I am having Ebbie Ridge. Mcglothen maintain her losartan-hydrochlorothiazide, diclofenac sodium, ramelteon, tiZANidine, donepezil, omeprazole, Spiriva HandiHaler, buPROPion, mirtazapine, sertraline, QUEtiapine,  pantoprazole, albuterol, formoterol, ipratropium, Tylenol PM Extra Strength, doxepin, meloxicam, Anoro Ellipta, Apple Cider Vinegar, DHEA, and pregabalin.  Pharmacotherapy (Medications Ordered): No orders of the defined types were placed in this encounter.  Orders:  Orders Placed This Encounter  Procedures  . RFA - Lumbar Facet (Schedule)    Standing Status:   Future    Standing Expiration Date:   10/24/2020    Scheduling Instructions:     Side(s): Left-sided     Level: L3-4, L4-5, & L5-S1 Facets (L2, L3, L4, L5, & S1 Medial Branch Nerves)     Sedation: With Sedation     Scheduling Timeframe: As soon as pre-approved    Order Specific Question:   Where will this procedure be performed?    Answer:   [facility]   Follow-up plan:   Return for RFA (w/ sedation): (L) L-FCT RFA #1.      Considering:   NOTE: SHELLFISH Allergy. Diagnostic midline LESI  Possible bilateral lumbar facet RFA  Possible bilateral thoracic RFA  Diagnostic left IA knee injection    PRN Procedures:   Diagnostic bilateral cervical facet block #2  Diagnostic bilateral Thoracic T7, T8, T9, & T10  Facet Medial Branch block #2 (80/100/100)  Diagnostic/therapeutic left CESI #2 (100/100/100)  Diagnostic/therapeutic bilateral lumbar facet nerve block #2 (100/100/75/>50)     Recent Visits Date Type Provider Dept  04/14/19 Procedure visit Milinda Pointer, MD [facility]  04/01/19 Office Visit Milinda Pointer, MD [facility]  03/12/19 Procedure visit Milinda Pointer, MD [facility]  02/16/19 Office Visit Milinda Pointer, MD [facility]  Showing recent visits within past 90 days and meeting all other requirements   Today's Visits Date Type Provider Dept  04/27/19 Telemedicine Milinda Pointer, MD [facility]  Showing today's visits and meeting all other requirements   Future Appointments No visits were found meeting these conditions.   Showing future appointments within next 90 days and meeting all other requirements   I discussed the assessment and treatment plan with the patient. The patient was provided an opportunity to ask questions and all were answered. The patient agreed with the plan and demonstrated an understanding of the instructions.  Patient advised to call back or seek an in-person evaluation if the symptoms or condition worsens.  Total duration of non-face-to-face encounter: 25 minutes.  Note by: Gaspar Cola, MD Date: 04/27/2019; Time: 4:49 PM  Note: This dictation was prepared with Dragon dictation. Any transcriptional errors that may result from this process are unintentional.  Disclaimer:  * Given the special circumstances of the COVID-19 pandemic, the federal government has announced that the Office for Civil Rights (OCR) will exercise its enforcement discretion and will not impose penalties on physicians using telehealth in the event of noncompliance with regulatory requirements under the [place] (HIPAA) in connection with the good faith provision of telehealth during the XX123456 national public health emergency. ([place])

## 2019-04-27 ENCOUNTER — Ambulatory Visit: Payer: Medicare Other | Attending: Pain Medicine | Admitting: Pain Medicine

## 2019-04-27 ENCOUNTER — Other Ambulatory Visit: Payer: Self-pay

## 2019-04-27 ENCOUNTER — Encounter: Payer: Self-pay | Admitting: Pain Medicine

## 2019-04-27 DIAGNOSIS — G8929 Other chronic pain: Secondary | ICD-10-CM

## 2019-04-27 DIAGNOSIS — M545 Low back pain, unspecified: Secondary | ICD-10-CM | POA: Insufficient documentation

## 2019-04-27 DIAGNOSIS — R937 Abnormal findings on diagnostic imaging of other parts of musculoskeletal system: Secondary | ICD-10-CM

## 2019-04-27 DIAGNOSIS — M47816 Spondylosis without myelopathy or radiculopathy, lumbar region: Secondary | ICD-10-CM | POA: Diagnosis not present

## 2019-04-27 NOTE — Progress Notes (Signed)
Pain relief after procedure (treated area only): (Questions asked to patient) 1. Starting about 15 minutes after the procedure, and "while the area was still numb" (from the local anesthetics), were you having any of your usual pain "in that area"? (NOT including the discomfort from the needle sticks.) First 1 hour: 100 % better. First 4-6 hours: 100 % better. 2. Assuming that it did get numb. How long was the area numb? 100 % benefit, longer than 6 hours. How long? 2 days. 3. How much better is your pain now, when compared to before the procedure? Current benefit: 0 % better. 4. Can you move better now? Improvement in ROM (Range of Motion): No. 5. Can you do more now? Improvement in function: No. 4. Did you have any problems with the procedure? Side-effects/Complications: No.

## 2019-05-19 ENCOUNTER — Ambulatory Visit (HOSPITAL_BASED_OUTPATIENT_CLINIC_OR_DEPARTMENT_OTHER): Payer: Medicare Other | Admitting: Pain Medicine

## 2019-05-19 ENCOUNTER — Other Ambulatory Visit: Payer: Self-pay

## 2019-05-19 ENCOUNTER — Encounter: Payer: Self-pay | Admitting: Pain Medicine

## 2019-05-19 ENCOUNTER — Ambulatory Visit
Admission: RE | Admit: 2019-05-19 | Discharge: 2019-05-19 | Disposition: A | Discharge status: Home / Self Care | Payer: Medicare Other | Source: Ambulatory Visit | Attending: Pain Medicine | Admitting: Pain Medicine

## 2019-05-19 VITALS — BP 134/94 | HR 82 | Temp 97.6°F | Resp 13 | Ht 67.0 in | Wt 210.0 lb

## 2019-05-19 DIAGNOSIS — M545 Low back pain: Secondary | ICD-10-CM | POA: Diagnosis present

## 2019-05-19 DIAGNOSIS — M51369 Other intervertebral disc degeneration, lumbar region without mention of lumbar back pain or lower extremity pain: Secondary | ICD-10-CM

## 2019-05-19 DIAGNOSIS — M5136 Other intervertebral disc degeneration, lumbar region: Secondary | ICD-10-CM | POA: Insufficient documentation

## 2019-05-19 DIAGNOSIS — Z87892 Personal history of anaphylaxis: Secondary | ICD-10-CM | POA: Insufficient documentation

## 2019-05-19 DIAGNOSIS — M47817 Spondylosis without myelopathy or radiculopathy, lumbosacral region: Secondary | ICD-10-CM | POA: Diagnosis present

## 2019-05-19 DIAGNOSIS — Z91041 Radiographic dye allergy status: Secondary | ICD-10-CM

## 2019-05-19 DIAGNOSIS — G8918 Other acute postprocedural pain: Secondary | ICD-10-CM | POA: Insufficient documentation

## 2019-05-19 DIAGNOSIS — Z91013 Allergy to seafood: Secondary | ICD-10-CM | POA: Insufficient documentation

## 2019-05-19 DIAGNOSIS — G8929 Other chronic pain: Secondary | ICD-10-CM | POA: Diagnosis present

## 2019-05-19 DIAGNOSIS — M47816 Spondylosis without myelopathy or radiculopathy, lumbar region: Secondary | ICD-10-CM | POA: Diagnosis present

## 2019-05-19 DIAGNOSIS — R937 Abnormal findings on diagnostic imaging of other parts of musculoskeletal system: Secondary | ICD-10-CM | POA: Insufficient documentation

## 2019-05-19 MED ORDER — FENTANYL CITRATE (PF) 100 MCG/2ML IJ SOLN
25.0000 ug | INTRAMUSCULAR | Status: DC | PRN
  Administered 2019-05-19: 100 ug via INTRAVENOUS
  Filled 2019-05-19: qty 2

## 2019-05-19 MED ORDER — ROPIVACAINE HCL 2 MG/ML IJ SOLN
9.0000 mL | Freq: Once | INTRAMUSCULAR | Status: AC
  Administered 2019-05-19: 10 mL via PERINEURAL
  Filled 2019-05-19: qty 10

## 2019-05-19 MED ORDER — HYDROCODONE-ACETAMINOPHEN 5-325 MG PO TABS: 1.0000 | ORAL_TABLET | Freq: Four times a day (QID) | ORAL | 0 refills | Status: AC | PRN

## 2019-05-19 MED ORDER — LIDOCAINE HCL 2 % IJ SOLN
20.0000 mL | Freq: Once | INTRAMUSCULAR | Status: AC
  Administered 2019-05-19: 400 mg
  Filled 2019-05-19: qty 20

## 2019-05-19 MED ORDER — LACTATED RINGERS IV SOLN
1000.0000 mL | Freq: Once | INTRAVENOUS | Status: AC
  Administered 2019-05-19: 1000 mL via INTRAVENOUS

## 2019-05-19 MED ORDER — MIDAZOLAM HCL 5 MG/5ML IJ SOLN
1.0000 mg | INTRAMUSCULAR | Status: DC | PRN
  Administered 2019-05-19: 2 mg via INTRAVENOUS
  Filled 2019-05-19: qty 5

## 2019-05-19 MED ORDER — TRIAMCINOLONE ACETONIDE 40 MG/ML IJ SUSP
40.0000 mg | Freq: Once | INTRAMUSCULAR | Status: AC
  Administered 2019-05-19: 40 mg
  Filled 2019-05-19: qty 1

## 2019-05-19 NOTE — Patient Instructions (Addendum)
___________________________________________________________________________________________  Post-Radiofrequency (RF) Discharge Instructions  You have just completed a Radiofrequency Neurotomy.  The following instructions will provide you with information and guidelines for self-care upon discharge.  If at any time you have questions or concerns please call your physician. DO NOT DRIVE YOURSELF!!  Instructions:  Apply ice: Fill a plastic sandwich bag with crushed ice. Cover it with a small towel and apply to injection site. Apply for 15 minutes then remove x 15 minutes. Repeat sequence on day of procedure, until you go to bed. The purpose is to minimize swelling and discomfort after procedure.  Apply heat: Apply heat to procedure site starting the day following the procedure. The purpose is to treat any soreness and discomfort from the procedure.  Food intake: No eating limitations, unless stipulated above.  Nevertheless, if you have had sedation, you may experience some nausea.  In this case, it may be wise to wait at least two hours prior to resuming regular diet.  Physical activities: Keep activities to a minimum for the first 8 hours after the procedure. For the first 24 hours after the procedure, do not drive a motor vehicle,  Operate heavy machinery, power tools, or handle any weapons.  Consider walking with the use of an assistive device or accompanied by an adult for the first 24 hours.  Do not drink alcoholic beverages including beer.  Do not make any important decisions or sign any legal documents. Go home and rest today.  Resume activities tomorrow, as tolerated.  Use caution in moving about as you may experience mild leg weakness.  Use caution in cooking, use of household electrical appliances and climbing steps.  Driving: If you have received any sedation, you are not allowed to drive for 24 hours after your procedure.  Blood thinner: Restart your blood thinner 6 hours after your  procedure. (Only for those taking blood thinners)  Insulin: As soon as you can eat, you may resume your normal dosing schedule. (Only for those taking insulin)  Medications: May resume pre-procedure medications.  Do not take any drugs, other than what has been prescribed to you.  Infection prevention: Keep procedure site clean and dry.  Post-procedure Pain Diary: Extremely important that this be done correctly and accurately. Recorded information will be used to determine the next step in treatment.  Pain evaluated is that of treated area only. Do not include pain from an untreated area.  Complete every hour, on the hour, for the initial 8 hours. Set an alarm to help you do this part accurately.  Do not go to sleep and have it completed later. It will not be accurate.  Follow-up appointment: Keep your follow-up appointment after the procedure. Usually 2 weeks for most procedures. (6 weeks in the case of radiofrequency.) Bring you pain diary.   Expect:  From numbing medicine (AKA: Local Anesthetics): Numbness or decrease in pain.  Onset: Full effect within 15 minutes of injected.  Duration: It will depend on the type of local anesthetic used. On the average, 1 to 8 hours.   From steroids: Decrease in swelling or inflammation. Once inflammation is improved, relief of the pain will follow.  Onset of benefits: Depends on the amount of swelling present. The more swelling, the longer it will take for the benefits to be seen. In some cases, up to 10 days.  Duration: Steroids will stay in the system x 2 weeks. Duration of benefits will depend on multiple posibilities including persistent irritating factors.  From procedure: Some   discomfort is to be expected once the numbing medicine wears off. This should be minimal if ice and heat are applied as instructed.  Call if:  You experience numbness and weakness that gets worse with time, as opposed to wearing off.  He experience any unusual  bleeding, difficulty breathing, or loss of the ability to control your bowel and bladder. (This applies to Spinal procedures only)  You experience any redness, swelling, heat, red streaks, elevated temperature, fever, or any other signs of a possible infection.  Emergency Numbers:  [facility] hours (Monday - Thursday, 8:00 AM - 4:00 PM) (Friday, 9:00 AM - 12:00 Noon): [phone]  After hours: [phone] ____________________________________________________________________________________________   ____________________________________________________________________________________________  Preparing for Procedure with Sedation  Procedure appointments are limited to planned procedures: . No Prescription Refills. . No disability issues will be discussed. . No medication changes will be discussed.  Instructions: . Oral Intake: Do not eat or drink anything for at least 8 hours prior to your procedure. . Transportation: Public transportation is not allowed. Bring an adult driver. The driver must be physically present in our waiting room before any procedure can be started. [facility] Kitchen Physical Assistance: Bring an adult physically capable of assisting you, in the event you need help. This adult should keep you company at home for at least 6 hours after the procedure. . Blood Pressure Medicine: Take your blood pressure medicine with a sip of water the morning of the procedure. . Blood thinners: Notify our staff if you are taking any blood thinners. Depending on which one you take, there will be specific instructions on how and when to stop it. . Diabetics on insulin: Notify the staff so that you can be scheduled 1st case in the morning. If your diabetes requires high dose insulin, take only  of your normal insulin dose the morning of the procedure and notify the staff that you have done so. . Preventing infections: Shower with an antibacterial soap the morning of your procedure. . Build-up  your immune system: Take 1000 mg of Vitamin C with every meal (3 times a day) the day prior to your procedure. Marland Kitchen Antibiotics: Inform the staff if you have a condition or reason that requires you to take antibiotics before dental procedures. . Pregnancy: If you are pregnant, call and cancel the procedure. . Sickness: If you have a cold, fever, or any active infections, call and cancel the procedure. . Arrival: You must be in the facility at least 30 minutes prior to your scheduled procedure. . Children: Do not bring children with you. . Dress appropriately: Bring dark clothing that you would not mind if they get stained. . Valuables: Do not bring any jewelry or valuables.  Reasons to call and reschedule or cancel your procedure: (Following these recommendations will minimize the risk of a serious complication.) . Surgeries: Avoid having procedures within 2 weeks of any surgery. (Avoid for 2 weeks before or after any surgery). . Flu Shots: Avoid having procedures within 2 weeks of a flu shots or . (Avoid for 2 weeks before or after immunizations). . Barium: Avoid having a procedure within 7-10 days after having had a radiological study involving the use of radiological contrast. (Myelograms, Barium swallow or enema study). . Heart attacks: Avoid any elective procedures or surgeries for the initial 6 months after a "Myocardial Infarction" (Heart Attack). . Blood thinners: It is imperative that you stop these medications before procedures. Let us know if you if you take any blood thinner.  Marland Kitchen  Infection: Avoid procedures during or within two weeks of an infection (including chest colds or gastrointestinal problems). Symptoms associated with infections include: Localized redness, fever, chills, night sweats or profuse sweating, burning sensation when voiding, cough, congestion, stuffiness, runny nose, sore throat, diarrhea, nausea, vomiting, cold or Flu symptoms, recent or current infections. It is specially  important if the infection is over the area that we intend to treat. Marland Kitchen Heart and lung problems: Symptoms that may suggest an active cardiopulmonary problem include: cough, chest pain, breathing difficulties or shortness of breath, dizziness, ankle swelling, uncontrolled high or unusually low blood pressure, and/or palpitations. If you are experiencing any of these symptoms, cancel your procedure and contact your primary care physician for an evaluation.  Remember:  Regular Business hours are:  Monday to Thursday 8:00 AM to 4:00 PM  Provider's Schedule: Milinda Pointer, MD:  Procedure days: Tuesday and Thursday 7:30 AM to 4:00 PM  Gillis Santa, MD:  Procedure days: Monday and Wednesday 7:30 AM to 4:00 PM ____________________________________________________________________________________________

## 2019-05-19 NOTE — Progress Notes (Signed)
Disclaimer: In compliance with Federal Rules mandating open notes, implemented by the [facility], these notes are made available to patients through the electronic medical record. Information contained herein reflects the providers review of information obtained during the pre-charting review of records, as well as notes taken during the patients appointment.  Although all data contained herein was reviewed by the provider, unless specifically stated, due to time constrains, not all of it was discussed with the patient during the appointment. Specific medical language and abbreviations used within medical records is meant to communicate precise data to individuals trained to interpret such data.  Warning: These encounter notes are not meant to serve the purpose of providing patients with clear and concise information on their conditions, treatment plan, or specific risks and possible complications. Such information is provided to patients under the encounter's "Patient Information" section. Interpretation of the information contained herein should be left to individuals with the necessary training to understand the data.  Patient's Name: Samantha Macias  MRN: [id number]  Referring Provider: Milinda Pointer, MD  DOB: 09-15-1960  PCP: Danae Orleans, MD  DOS: 05/19/2019  Note by: Gaspar Cola, MD  Service setting: Ambulatory outpatient  Specialty: Interventional Pain Management  Patient type: Established  Location: [facility] (AMB) Pain Management Facility  Visit type: Interventional Procedure   Primary Reason for Visit: Interventional Pain Management Treatment. CC: Back Pain (lower)  Procedure:          Anesthesia, Analgesia, Anxiolysis:  Type: Thermal Lumbar Facet, Medial Branch Radiofrequency Ablation/Neurotomy  #1  Primary Purpose: Therapeutic Region: Posterolateral Lumbosacral Spine Level: L2, L3, L4, L5, & S1 Medial Branch Level(s). These levels will denervate the L3-4, L4-5,  and the L5-S1 lumbar facet joints. Laterality: Left  Type: Moderate (Conscious) Sedation combined with Local Anesthesia Indication(s): Analgesia and Anxiety Route: Intravenous (IV) IV Access: Secured Sedation: Meaningful verbal contact was maintained at all times during the procedure  Local Anesthetic: Lidocaine 1-2%  Position: Prone   Indications: 1. Lumbar facet syndrome (Bilateral)   2. Spondylosis without myelopathy or radiculopathy, lumbosacral region   3. Lumbar facet hypertrophy (Bilateral)   4. DDD (degenerative disc disease), lumbar   5. Chronic low back pain (Bilateral) (L>R) w/o sciatica    Ms. Reinhart has been dealing with the above chronic pain for longer than three months and has either failed to respond, was unable to tolerate, or simply did not get enough benefit from other more conservative therapies including, but not limited to: 1. Over-the-counter medications 2. Anti-inflammatory medications 3. Muscle relaxants 4. Membrane stabilizers 5. Opioids 6. Physical therapy and/or chiropractic manipulation 7. Modalities (Heat, ice, etc.) 8. Invasive techniques such as nerve blocks. Ms. Gavidia has attained more than 50% relief of the pain from a series of diagnostic injections conducted in separate occasions.  Pain Score: Pre-procedure: 5 /10 Post-procedure: 1 (one area feels pressure)/10  Pre-op Assessment:  Ms. Lyter is a 58 y.o. (year old), female patient, seen today for interventional treatment. She  has a past surgical history that includes Tubal ligation; Nasal sinus surgery; Colonoscopy with propofol (N/A, 04/01/2017); Esophagogastroduodenoscopy (egd) with propofol (N/A, 04/01/2017); and Mouth surgery. Ms. Kovalev has a current medication list which includes the following prescription(s): albuterol, apple cider vinegar, bupropion, dhea, diclofenac sodium, tylenol pm extra strength, donepezil, doxepin, formoterol, ipratropium, losartan-hydrochlorothiazide, meloxicam,  mirtazapine, pantoprazole, pregabalin, quetiapine, ramelteon, sertraline, spiriva handihaler, tizanidine, anoro ellipta, hydrocodone-acetaminophen, [START ON 05/26/2019] hydrocodone-acetaminophen, and omeprazole, and the following Facility-Administered Medications: fentanyl and midazolam. Her primarily concern today  is the Back Pain (lower)  Initial Vital Signs:  Pulse/HCG Rate: 82ECG Heart Rate: 85 Temp: 97.6 F (36.4 C) Resp: 18 BP: (!) 145/91 SpO2: 97 %  BMI: Estimated body mass index is 32.89 kg/m as calculated from the following:   Height as of this encounter: 5\' 7"  (1.702 m).   Weight as of this encounter: 210 lb (95.3 kg).  Risk Assessment: Allergies: Reviewed. She is allergic to contrast media [iodinated diagnostic agents]; shellfish allergy; tetanus toxoid adsorbed; ace inhibitors; codeine; nitrofurantoin monohyd macro; and red dye.  Allergy Precautions: None required Coagulopathies: Reviewed. None identified.  Blood-thinner therapy: None at this time Active Infection(s): Reviewed. None identified. Ms. Brunetti is afebrile  Site Confirmation: Ms. Erdely was asked to confirm the procedure and laterality before marking the site Procedure checklist: Completed Consent: Before the procedure and under the influence of no sedative(s), amnesic(s), or anxiolytics, the patient was informed of the treatment options, risks and possible complications. To fulfill our ethical and legal obligations, as recommended by the American Medical Association's Code of Ethics, I have informed the patient of my clinical impression; the nature and purpose of the treatment or procedure; the risks, benefits, and possible complications of the intervention; the alternatives, including doing nothing; the risk(s) and benefit(s) of the alternative treatment(s) or procedure(s); and the risk(s) and benefit(s) of doing nothing. The patient was provided information about the general risks and possible complications  associated with the procedure. These may include, but are not limited to: failure to achieve desired goals, infection, bleeding, organ or nerve damage, allergic reactions, paralysis, and death. In addition, the patient was informed of those risks and complications associated to Spine-related procedures, such as failure to decrease pain; infection (i.e.: Meningitis, epidural or intraspinal abscess); bleeding (i.e.: epidural hematoma, subarachnoid hemorrhage, or any other type of intraspinal or peri-dural bleeding); organ or nerve damage (i.e.: Any type of peripheral nerve, nerve root, or spinal cord injury) with subsequent damage to sensory, motor, and/or autonomic systems, resulting in permanent pain, numbness, and/or weakness of one or several areas of the body; allergic reactions; (i.e.: anaphylactic reaction); and/or death. Furthermore, the patient was informed of those risks and complications associated with the medications. These include, but are not limited to: allergic reactions (i.e.: anaphylactic or anaphylactoid reaction(s)); adrenal axis suppression; blood sugar elevation that in diabetics may result in ketoacidosis or comma; water retention that in patients with history of congestive heart failure may result in shortness of breath, pulmonary edema, and decompensation with resultant heart failure; weight gain; swelling or edema; medication-induced neural toxicity; particulate matter embolism and blood vessel occlusion with resultant organ, and/or nervous system infarction; and/or aseptic necrosis of one or more joints. Finally, the patient was informed that Medicine is not an exact science; therefore, there is also the possibility of unforeseen or unpredictable risks and/or possible complications that may result in a catastrophic outcome. The patient indicated having understood very clearly. We have given the patient no guarantees and we have made no promises. Enough time was given to the patient to  ask questions, all of which were answered to the patient's satisfaction. Ms. Feilen has indicated that she wanted to continue with the procedure. Attestation: I, the ordering provider, attest that I have discussed with the patient the benefits, risks, side-effects, alternatives, likelihood of achieving goals, and potential problems during recovery for the procedure that I have provided informed consent. Date  Time: 05/19/2019 10:08 AM  Pre-Procedure Preparation:  Monitoring: As per clinic protocol. Respiration, ETCO2, SpO2, BP,  heart rate and rhythm monitor placed and checked for adequate function Safety Precautions: Patient was assessed for positional comfort and pressure points before starting the procedure. Time-out: I initiated and conducted the "Time-out" before starting the procedure, as per protocol. The patient was asked to participate by confirming the accuracy of the "Time Out" information. Verification of the correct person, site, and procedure were performed and confirmed by me, the nursing staff, and the patient. "Time-out" conducted as per Joint Commission's Universal Protocol (UP.01.01.01). Time: 1148  Description of Procedure:          Laterality: Left Levels:  L2, L3, L4, L5, & S1 Medial Branch Level(s), at the L3-4, L4-5, and the L5-S1 lumbar facet joints. Area Prepped: Lumbosacral Prepping solution: ChloraPrep (2% chlorhexidine gluconate and 70% isopropyl alcohol) Safety Precautions: Aspiration looking for blood return was conducted prior to all injections. At no point did we inject any substances, as a needle was being advanced. Before injecting, the patient was told to immediately notify me if she was experiencing any new onset of "ringing in the ears, or metallic taste in the mouth". No attempts were made at seeking any paresthesias. Safe injection practices and needle disposal techniques used. Medications properly checked for expiration dates. SDV (single dose vial) medications  used. After the completion of the procedure, all disposable equipment used was discarded in the proper designated medical waste containers. Local Anesthesia: Protocol guidelines were followed. The patient was positioned over the fluoroscopy table. The area was prepped in the usual manner. The time-out was completed. The target area was identified using fluoroscopy. A 12-in long, straight, sterile hemostat was used with fluoroscopic guidance to locate the targets for each level blocked. Once located, the skin was marked with an approved surgical skin marker. Once all sites were marked, the skin (epidermis, dermis, and hypodermis), as well as deeper tissues (fat, connective tissue and muscle) were infiltrated with a small amount of a short-acting local anesthetic, loaded on a 10cc syringe with a 25G, 1.5-in  Needle. An appropriate amount of time was allowed for local anesthetics to take effect before proceeding to the next step. Local Anesthetic: Lidocaine 2.0% The unused portion of the local anesthetic was discarded in the proper designated containers. Technical explanation of process:  Radiofrequency Ablation (RFA) L2 Medial Branch Nerve RFA: The target area for the L2 medial branch is at the junction of the postero-lateral aspect of the superior articular process and the superior, posterior, and medial edge of the transverse process of L3. Under fluoroscopic guidance, a Radiofrequency needle was inserted until contact was made with os over the superior postero-lateral aspect of the pedicular shadow (target area). Sensory and motor testing was conducted to properly adjust the position of the needle. Once satisfactory placement of the needle was achieved, the numbing solution was slowly injected after negative aspiration for blood. 2.0 mL of the nerve block solution was injected without difficulty or complication. After waiting for at least 3 minutes, the ablation was performed. Once completed, the needle was  removed intact. L3 Medial Branch Nerve RFA: The target area for the L3 medial branch is at the junction of the postero-lateral aspect of the superior articular process and the superior, posterior, and medial edge of the transverse process of L4. Under fluoroscopic guidance, a Radiofrequency needle was inserted until contact was made with os over the superior postero-lateral aspect of the pedicular shadow (target area). Sensory and motor testing was conducted to properly adjust the position of the needle. Once satisfactory placement  of the needle was achieved, the numbing solution was slowly injected after negative aspiration for blood. 2.0 mL of the nerve block solution was injected without difficulty or complication. After waiting for at least 3 minutes, the ablation was performed. Once completed, the needle was removed intact. L4 Medial Branch Nerve RFA: The target area for the L4 medial branch is at the junction of the postero-lateral aspect of the superior articular process and the superior, posterior, and medial edge of the transverse process of L5. Under fluoroscopic guidance, a Radiofrequency needle was inserted until contact was made with os over the superior postero-lateral aspect of the pedicular shadow (target area). Sensory and motor testing was conducted to properly adjust the position of the needle. Once satisfactory placement of the needle was achieved, the numbing solution was slowly injected after negative aspiration for blood. 2.0 mL of the nerve block solution was injected without difficulty or complication. After waiting for at least 3 minutes, the ablation was performed. Once completed, the needle was removed intact. L5 Medial Branch Nerve RFA: The target area for the L5 medial branch is at the junction of the postero-lateral aspect of the superior articular process of S1 and the superior, posterior, and medial edge of the sacral ala. Under fluoroscopic guidance, a Radiofrequency needle was  inserted until contact was made with os over the superior postero-lateral aspect of the pedicular shadow (target area). Sensory and motor testing was conducted to properly adjust the position of the needle. Once satisfactory placement of the needle was achieved, the numbing solution was slowly injected after negative aspiration for blood. 2.0 mL of the nerve block solution was injected without difficulty or complication. After waiting for at least 3 minutes, the ablation was performed. Once completed, the needle was removed intact. S1 Medial Branch Nerve RFA: The target area for the S1 medial branch is located inferior to the junction of the S1 superior articular process and the L5 inferior articular process, posterior, inferior, and lateral to the 6 o'clock position of the L5-S1 facet joint, just superior to the S1 posterior foramen. Under fluoroscopic guidance, the Radiofrequency needle was advanced until contact was made with os over the Target area. Sensory and motor testing was conducted to properly adjust the position of the needle. Once satisfactory placement of the needle was achieved, the numbing solution was slowly injected after negative aspiration for blood. 2.0 mL of the nerve block solution was injected without difficulty or complication. After waiting for at least 3 minutes, the ablation was performed. Once completed, the needle was removed intact. Radiofrequency lesioning (ablation):  Radiofrequency Generator: NeuroTherm [id number] Sensory Stimulation Parameters: 50 Hz was used to locate & identify the nerve, making sure that the needle was positioned such that there was no sensory stimulation below 0.3 V or above 0.7 V. Motor Stimulation Parameters: 2 Hz was used to evaluate the motor component. Care was taken not to lesion any nerves that demonstrated motor stimulation of the lower extremities at an output of less than 2.5 times that of the sensory threshold, or a maximum of 2.0 V. Lesioning  Technique Parameters: Standard Radiofrequency settings. (Not bipolar or pulsed.) Temperature Settings: 80 degrees C Lesioning time: 60 seconds Intra-operative Compliance: Compliant Materials & Medications: Needle(s) (Electrode/Cannula) Type: Teflon-coated, curved tip, Radiofrequency needle(s) Gauge: 22G Length: 10cm Numbing solution: 0.2% PF-Ropivacaine + Triamcinolone (40 mg/mL) diluted to a final concentration of 4 mg of Triamcinolone/mL of Ropivacaine The unused portion of the solution was discarded in the proper designated containers.  Once  the entire procedure was completed, the treated area was cleaned, making sure to leave some of the prepping solution back to take advantage of its long term bactericidal properties.  Illustration of the posterior view of the lumbar spine and the posterior neural structures. Laminae of L2 through S1 are labeled. DPRL5, dorsal primary ramus of L5; DPRS1, dorsal primary ramus of S1; DPR3, dorsal primary ramus of L3; FJ, facet (zygapophyseal) joint L3-L4; I, inferior articular process of L4; LB1, lateral branch of dorsal primary ramus of L1; IAB, inferior articular branches from L3 medial branch (supplies L4-L5 facet joint); IBP, intermediate branch plexus; MB3, medial branch of dorsal primary ramus of L3; NR3, third lumbar nerve root; S, superior articular process of L5; SAB, superior articular branches from L4 (supplies L4-5 facet joint also); TP3, transverse process of L3.  Vitals:   05/19/19 1209 05/19/19 1220 05/19/19 1230 05/19/19 1240  BP: (!) 145/98 116/75 124/89 (!) 134/94  Pulse:      Resp: 14 20 13 13   Temp:      TempSrc:      SpO2: 99% 100% 100% 100%  Weight:      Height:        Start Time: 1148 hrs. End Time:   hrs.  Imaging Guidance (Spinal):          Type of Imaging Technique: Fluoroscopy Guidance (Spinal) Indication(s): Assistance in needle guidance and placement for procedures requiring needle placement in or near specific  anatomical locations not easily accessible without such assistance. Exposure Time: Please see nurses notes. Contrast: None used. Fluoroscopic Guidance: I was personally present during the use of fluoroscopy. "Tunnel Vision Technique" used to obtain the best possible view of the target area. Parallax error corrected before commencing the procedure. "Direction-depth-direction" technique used to introduce the needle under continuous pulsed fluoroscopy. Once target was reached, antero-posterior, oblique, and lateral fluoroscopic projection used confirm needle placement in all planes. Images permanently stored in EMR. Interpretation: No contrast injected. I personally interpreted the imaging intraoperatively. Adequate needle placement confirmed in multiple planes. Permanent images saved into the patient's record.  Antibiotic Prophylaxis:   Anti-infectives (From admission, onward)   None     Indication(s): None identified  Post-operative Assessment:  Post-procedure Vital Signs:  Pulse/HCG Rate: 8278 Temp: 97.6 F (36.4 C) Resp: 13 BP: (!) 134/94 SpO2: 100 %  EBL: None  Complications: No immediate post-treatment complications observed by team, or reported by patient.  Note: The patient tolerated the entire procedure well. A repeat set of vitals were taken after the procedure and the patient was kept under observation following institutional policy, for this type of procedure. Post-procedural neurological assessment was performed, showing return to baseline, prior to discharge. The patient was provided with post-procedure discharge instructions, including a section on how to identify potential problems. Should any problems arise concerning this procedure, the patient was given instructions to immediately contact us, at any time, without hesitation. In any case, we plan to contact the patient by telephone for a follow-up status report regarding this interventional procedure.  Comments:  No  additional relevant information.  Plan of Care  Orders:  Orders Placed This Encounter  Procedures  . RFA - Lumbar Facet (Today)    Scheduling Instructions:     Side(s): Left-sided     Level: L3-4, L4-5, & L5-S1 Facets (L2, L3, L4, L5, & S1 Medial Branch Nerves)     Sedation: With Sedation     Timeframe: Today    Order Specific Question:  Where will this procedure be performed?    Answer:   [facility]  . RFA - Lumbar Facet (Schedule)    Standing Status:   Future    Standing Expiration Date:   11/16/2020    Scheduling Instructions:     Side(s): Right-sided     Level: L3-4, L4-5, & L5-S1 Facets (L2, L3, L4, L5, & S1 Medial Branch Nerves)     Sedation: With Sedation     Scheduling Timeframe: 2-6 weeks from now    Order Specific Question:   Where will this procedure be performed?    Answer:   [facility]  . Fluoro (C-Arm) (<60 min) (No Report)    Intraoperative interpretation by procedural physician at [place].    Standing Status:   Standing    Number of Occurrences:   1    Order Specific Question:   Reason for exam:    Answer:   Assistance in needle guidance and placement for procedures requiring needle placement in or near specific anatomical locations not easily accessible without such assistance.  . Consent: L-FCT (RFA)    Nursing Order: Transcribe to consent form and obtain patient signature. Note: Always confirm laterality of pain with [name], before procedure. Procedure: Lumbar Facet Radiofrequency Ablation Indication/Reason: Low Back Pain, with our without leg pain, due to Facet Joint Arthralgia (Joint Pain) known as Lumbar Facet Syndrome, secondary to Lumbar, and/or Lumbosacral Spondylosis (Arthritis of the Spine), without myelopathy or radiculopathy (Nerve Damage). Provider Attestation: I, [place] Dossie Arbour, MD, (Pain Management Specialist), the physician/practitioner, attest that I have discussed with the patient the benefits, risks,  side effects, alternatives, likelihood of achieving goals and potential problems during recovery for the procedure that I have provided informed consent.  . Radiofrequency Tray    Equipment required: Sterile "Radiofrequency Tray"; Large hemostat (1); Small hemostat (1); Towels (6-8); 4x4 sterile sponge pack (1) Radiofrequency Needle(s): Size: Regular Quantity: 5    Standing Status:   Standing    Number of Occurrences:   1    Order Specific Question:   Specify    Answer:   Radiofrequency Tray  . Allergy: IODINE / Shellfish    NOTE: Although It is true that patients can have allergies to shellfish and that shellfish contain iodine, most shellfish  allergies are due to two protein allergens present in the shellfish: tropomyosins and parvalbumin. Not all patients with shellfish allergies are allergic to iodine. However, as a precaution, avoid using iodine containing products.    Standing Status:   Standing    Number of Occurrences:   1  . Allergy: CONTRAST    Standing Status:   Standing    Number of Occurrences:   1   Chronic Opioid Analgesic:  None provided by our practice. NO OPIOIDS: UDS (08/07/17) (+) for Unreported Benzoylecgonine, a metabolite of cocaine; its presence    indicates use of this drug.  Source is most commonly illicit.   Medications ordered for procedure: Meds ordered this encounter  Medications  . lidocaine (XYLOCAINE) 2 % (with pres) injection 400 mg  . lactated ringers infusion 1,000 mL  . midazolam (VERSED) 5 MG/5ML injection 1-2 mg    Make sure Flumazenil is available in the pyxis when using this medication. If oversedation occurs, administer 0.2 mg IV over 15 sec. If after 45 sec no response, administer 0.2 mg again over 1 min; may repeat at 1 min intervals; not to exceed 4 doses (1 mg)  . fentaNYL (SUBLIMAZE)  injection 25-50 mcg    Make sure Narcan is available in the pyxis when using this medication. In the event of respiratory depression (RR< 8/min): Titrate  NARCAN (naloxone) in increments of 0.1 to 0.2 mg IV at 2-3 minute intervals, until desired degree of reversal.  . ropivacaine (PF) 2 mg/mL (0.2%) (NAROPIN) injection 9 mL  . triamcinolone acetonide (KENALOG-40) injection 40 mg  . HYDROcodone-acetaminophen (NORCO/VICODIN) 5-325 MG tablet    Sig: Take 1 tablet by mouth every 6 (six) hours as needed for up to 7 days for severe pain. Must last 7 days.    Dispense:  28 tablet    Refill:  0    For acute post-operative pain. Not to be refilled. Must last 7 days.  Marland Kitchen HYDROcodone-acetaminophen (NORCO/VICODIN) 5-325 MG tablet    Sig: Take 1 tablet by mouth every 6 (six) hours as needed for up to 7 days for severe pain. Must last 7 days.    Dispense:  28 tablet    Refill:  0    For acute post-operative pain. Not to be refilled.  Must last 7 days.   Medications administered: We administered lidocaine, lactated ringers, midazolam, fentaNYL, ropivacaine (PF) 2 mg/mL (0.2%), and triamcinolone acetonide.  See the medical record for exact dosing, route, and time of administration.  Follow-up plan:   Return in about 2 weeks (around 06/02/2019) for RFA (w/ sedation): (R) L-FCT RFA #1.  Therapeutic left lumbar facet RFA #1 (today)      Interventional treatment options:  Under consideration:   NOTE: (IODINE) SHELLFISH & CONTRAST ALLERGY!! (ANAPHYLACTIC)  Therapeutic right lumbar facet RFA #1 (2-6 weeks after left)  Diagnostic midline LESI  Possible bilateral lumbar facet RFA  Possible bilateral thoracic RFA  Diagnostic left IA knee injection    Therapeutic/palliative (PRN):   Diagnostic bilateral cervical facet block #2  Diagnostic bilateral Thoracic T7, T8, T9, & T10  Facet Medial Branch block #2 (80/100/100)  Diagnostic/therapeutic left CESI #2 (100/100/100)  Palliative bilateral lumbar facet block #3     Recent Visits Date Type Provider Dept  04/27/19 Telemedicine Milinda Pointer, MD [facility]  04/14/19 Procedure visit Milinda Pointer, MD [facility]  04/01/19 Office Visit Milinda Pointer, MD [facility]  03/12/19 Procedure visit Milinda Pointer, MD [facility]  Showing recent visits within past 90 days and meeting all other requirements   Today's Visits Date Type Provider Dept  05/19/19 Procedure visit Milinda Pointer, MD [facility]  Showing today's visits and meeting all other requirements   Future Appointments Date Type Provider Dept  06/16/19 Appointment Milinda Pointer, MD [facility]  Showing future appointments within next 90 days and meeting all other requirements   Disposition: Discharge home  Discharge Date & Time: 05/19/2019; 1245 hrs.   Primary Care Physician: Danae Orleans, MD Location: [facility] Note by: Gaspar Cola, MD Date: 05/19/2019; Time: 12:58 PM  Disclaimer:  Medicine is not an Chief Strategy Officer. The only guarantee in medicine is that nothing is guaranteed. It is important to note that the decision to proceed with this intervention was based on the information collected from the patient. The Data and conclusions were drawn from the patient's questionnaire, the interview, and the physical examination. Because the information was provided in large part by the patient, it cannot be guaranteed that it has not been purposely or unconsciously manipulated. Every effort has been made to obtain as much relevant data as possible for this evaluation.  It is important to note that the conclusions that lead to this procedure are derived in large part from the available data. Always take into account that the treatment will also be dependent on availability of resources and existing treatment guidelines, considered by other Pain Management Practitioners as being common knowledge and practice, at the time of the intervention. For Medico-Legal purposes, it is also important to point out that variation in  procedural techniques and pharmacological choices are the acceptable norm. The indications, contraindications, technique, and results of the above procedure should only be interpreted and judged by a Board-Certified Interventional Pain Specialist with extensive familiarity and expertise in the same exact procedure and technique.

## 2019-05-19 NOTE — Progress Notes (Signed)
Safety precautions to be maintained throughout the outpatient stay will include: orient to surroundings, keep bed in low position, maintain call bell within reach at all times, provide assistance with transfer out of bed and ambulation.  

## 2019-05-20 ENCOUNTER — Telehealth: Payer: Self-pay

## 2019-05-20 NOTE — Telephone Encounter (Signed)
Patient states she is sore this morning but is taking her medications as directed. She states she is happy with the care of Dr Dossie Arbour and how he instructs the patient on what to expect.

## 2019-05-21 DIAGNOSIS — R5381 Other malaise: Secondary | ICD-10-CM | POA: Insufficient documentation

## 2019-05-21 DIAGNOSIS — F410 Panic disorder [episodic paroxysmal anxiety] without agoraphobia: Secondary | ICD-10-CM | POA: Insufficient documentation

## 2019-05-21 DIAGNOSIS — M5442 Lumbago with sciatica, left side: Secondary | ICD-10-CM | POA: Insufficient documentation

## 2019-05-21 DIAGNOSIS — G8929 Other chronic pain: Secondary | ICD-10-CM | POA: Insufficient documentation

## 2019-05-21 DIAGNOSIS — R5383 Other fatigue: Secondary | ICD-10-CM | POA: Insufficient documentation

## 2019-05-27 ENCOUNTER — Telehealth: Payer: Self-pay | Admitting: Pain Medicine

## 2019-05-27 NOTE — Telephone Encounter (Signed)
Patient states she is almost out of the script of extra meds Dr. Dossie Arbour gave her, she has 5 left,  and she is having more pain with this last procedure than usual. She would like to speak with Nurse about the symptoms she is having.

## 2019-05-27 NOTE — Telephone Encounter (Signed)
Talked with patient and gave instructions on post  procedure. Instructed patient that she can always go to the ER if needed.

## 2019-06-03 ENCOUNTER — Other Ambulatory Visit: Payer: Self-pay | Admitting: Pain Medicine

## 2019-06-03 DIAGNOSIS — G8918 Other acute postprocedural pain: Secondary | ICD-10-CM

## 2019-06-10 ENCOUNTER — Telehealth: Payer: Self-pay | Admitting: Pain Medicine

## 2019-06-10 ENCOUNTER — Encounter: Payer: Self-pay | Admitting: Pain Medicine

## 2019-06-10 NOTE — Telephone Encounter (Signed)
Patient states she is still having a lot of pain when moving around. She does not have a follow up appt, has the next procedure appt instead. Would like to speak with someone about her pain

## 2019-06-10 NOTE — Telephone Encounter (Signed)
I called patient, she did not mention any pain. She was inquiring about her follow-up appt. I advised her that follow-up after RFA is done 6 weeks after the procedure, she did not miss a VV.

## 2019-06-11 ENCOUNTER — Other Ambulatory Visit: Payer: Self-pay | Admitting: Pain Medicine

## 2019-06-11 DIAGNOSIS — G8918 Other acute postprocedural pain: Secondary | ICD-10-CM

## 2019-06-11 MED ORDER — HYDROCODONE-ACETAMINOPHEN 5-325 MG PO TABS: 1.0000 | ORAL_TABLET | Freq: Four times a day (QID) | ORAL | 0 refills | Status: AC | PRN

## 2019-06-16 ENCOUNTER — Ambulatory Visit: Payer: Medicare Other | Admitting: Pain Medicine

## 2019-06-30 ENCOUNTER — Other Ambulatory Visit: Payer: Self-pay

## 2019-06-30 ENCOUNTER — Ambulatory Visit (HOSPITAL_BASED_OUTPATIENT_CLINIC_OR_DEPARTMENT_OTHER): Payer: Medicare Other | Admitting: Pain Medicine

## 2019-06-30 ENCOUNTER — Ambulatory Visit
Admission: RE | Admit: 2019-06-30 | Discharge: 2019-06-30 | Disposition: A | Discharge status: Home / Self Care | Payer: Medicare Other | Source: Ambulatory Visit | Attending: Pain Medicine | Admitting: Pain Medicine

## 2019-06-30 ENCOUNTER — Encounter: Payer: Self-pay | Admitting: Pain Medicine

## 2019-06-30 VITALS — BP 104/77 | HR 93 | Temp 97.5°F | Resp 22 | Ht 68.0 in | Wt 196.0 lb

## 2019-06-30 DIAGNOSIS — M47816 Spondylosis without myelopathy or radiculopathy, lumbar region: Secondary | ICD-10-CM | POA: Diagnosis present

## 2019-06-30 DIAGNOSIS — M47817 Spondylosis without myelopathy or radiculopathy, lumbosacral region: Secondary | ICD-10-CM

## 2019-06-30 DIAGNOSIS — Z91013 Allergy to seafood: Secondary | ICD-10-CM | POA: Diagnosis present

## 2019-06-30 DIAGNOSIS — M545 Low back pain: Secondary | ICD-10-CM

## 2019-06-30 DIAGNOSIS — Z91041 Radiographic dye allergy status: Secondary | ICD-10-CM | POA: Insufficient documentation

## 2019-06-30 DIAGNOSIS — M51369 Other intervertebral disc degeneration, lumbar region without mention of lumbar back pain or lower extremity pain: Secondary | ICD-10-CM

## 2019-06-30 DIAGNOSIS — M5136 Other intervertebral disc degeneration, lumbar region: Secondary | ICD-10-CM

## 2019-06-30 DIAGNOSIS — Z87892 Personal history of anaphylaxis: Secondary | ICD-10-CM | POA: Insufficient documentation

## 2019-06-30 DIAGNOSIS — G8929 Other chronic pain: Secondary | ICD-10-CM | POA: Diagnosis present

## 2019-06-30 DIAGNOSIS — G8918 Other acute postprocedural pain: Secondary | ICD-10-CM | POA: Diagnosis present

## 2019-06-30 MED ORDER — FENTANYL CITRATE (PF) 100 MCG/2ML IJ SOLN
25.0000 ug | INTRAMUSCULAR | Status: DC | PRN
  Administered 2019-06-30: 14:00:00 100 ug via INTRAVENOUS

## 2019-06-30 MED ORDER — FENTANYL CITRATE (PF) 100 MCG/2ML IJ SOLN
INTRAMUSCULAR | Status: AC
  Filled 2019-06-30: qty 2

## 2019-06-30 MED ORDER — HYDROCODONE-ACETAMINOPHEN 5-325 MG PO TABS: 1.0000 | ORAL_TABLET | Freq: Four times a day (QID) | ORAL | 0 refills | Status: AC | PRN

## 2019-06-30 MED ORDER — LIDOCAINE HCL 2 % IJ SOLN
20.0000 mL | Freq: Once | INTRAMUSCULAR | Status: AC
  Administered 2019-06-30: 400 mg

## 2019-06-30 MED ORDER — LACTATED RINGERS IV SOLN
1000.0000 mL | Freq: Once | INTRAVENOUS | Status: AC
  Administered 2019-06-30: 13:00:00 1000 mL via INTRAVENOUS

## 2019-06-30 MED ORDER — MIDAZOLAM HCL 5 MG/5ML IJ SOLN
INTRAMUSCULAR | Status: AC
  Filled 2019-06-30: qty 5

## 2019-06-30 MED ORDER — ROPIVACAINE HCL 2 MG/ML IJ SOLN
9.0000 mL | Freq: Once | INTRAMUSCULAR | Status: AC
  Administered 2019-06-30: 14:00:00 9 mL via PERINEURAL

## 2019-06-30 MED ORDER — TRIAMCINOLONE ACETONIDE 40 MG/ML IJ SUSP
40.0000 mg | Freq: Once | INTRAMUSCULAR | Status: AC
  Administered 2019-06-30: 14:00:00 40 mg

## 2019-06-30 MED ORDER — LIDOCAINE HCL 2 % IJ SOLN
INTRAMUSCULAR | Status: AC
  Filled 2019-06-30: qty 20

## 2019-06-30 MED ORDER — TRIAMCINOLONE ACETONIDE 40 MG/ML IJ SUSP
INTRAMUSCULAR | Status: AC
  Filled 2019-06-30: qty 1

## 2019-06-30 MED ORDER — ROPIVACAINE HCL 2 MG/ML IJ SOLN
INTRAMUSCULAR | Status: AC
  Filled 2019-06-30: qty 10

## 2019-06-30 MED ORDER — MIDAZOLAM HCL 5 MG/5ML IJ SOLN
1.0000 mg | INTRAMUSCULAR | Status: DC | PRN
  Administered 2019-06-30: 14:00:00 3 mg via INTRAVENOUS

## 2019-06-30 NOTE — Patient Instructions (Signed)

## 2019-06-30 NOTE — Progress Notes (Signed)
Safety precautions to be maintained throughout the outpatient stay will include: orient to surroundings, keep bed in low position, maintain call bell within reach at all times, provide assistance with transfer out of bed and ambulation.  

## 2019-06-30 NOTE — Progress Notes (Signed)
PROVIDER NOTE: Information contained herein reflects review and annotations entered in association with encounter. Interpretation of such information and data should be left to medically-trained personnel. Information provided to patient can be located elsewhere in the medical record under "Patient Instructions". Document created using STT-dictation technology, any transcriptional errors that may result from process are unintentional.    Patient: Samantha Macias  Service Category: Procedure  Provider: Gaspar Cola, MD  DOB: 04-21-1961  DOS: 06/30/2019  Location: [facility]  MRN: [id number]  Setting: Ambulatory - outpatient  Referring Provider: Danae Orleans, MD  Type: Established Patient  Specialty: Interventional Pain Management  PCP: Danae Orleans, MD   Primary Reason for Visit: Interventional Pain Management Treatment. CC: Back Pain (lower)  Procedure:          Anesthesia, Analgesia, Anxiolysis:  Type: Thermal Lumbar Facet, Medial Branch Radiofrequency Ablation/Neurotomy  #1  Primary Purpose: Therapeutic Region: Posterolateral Lumbosacral Spine Level: L2, L3, L4, L5, & S1 Medial Branch Level(s). These levels will denervate the L3-4, L4-5, and the L5-S1 lumbar facet joints. Laterality: Right  Type: Moderate (Conscious) Sedation combined with Local Anesthesia Indication(s): Analgesia and Anxiety Route: Intravenous (IV) IV Access: Secured Sedation: Meaningful verbal contact was maintained at all times during the procedure  Local Anesthetic: Lidocaine 1-2%  Position: Prone   Indications: 1. Lumbar facet syndrome (Bilateral)   2. Spondylosis without myelopathy or radiculopathy, lumbosacral region   3. Lumbar facet hypertrophy (Bilateral)   4. DDD (degenerative disc disease), lumbar   5. Chronic low back pain (Bilateral) (L>R) w/o sciatica   6. History of anaphylaxis   7. History of allergy to radiographic contrast media   8. History of allergy to  shellfish   9. Acute postoperative pain    Samantha Macias has been dealing with the above chronic pain for longer than three months and has either failed to respond, was unable to tolerate, or simply did not get enough benefit from other more conservative therapies including, but not limited to: 1. Over-the-counter medications 2. Anti-inflammatory medications 3. Muscle relaxants 4. Membrane stabilizers 5. Opioids 6. Physical therapy and/or chiropractic manipulation 7. Modalities (Heat, ice, etc.) 8. Invasive techniques such as nerve blocks. Ms. Tonti has attained more than 50% relief of the pain from a series of diagnostic injections conducted in separate occasions.  Pain Score: Pre-procedure: 7 /10 Post-procedure: 2 /10  Pre-op Assessment:  Samantha Macias is a 59 y.o. (year old), female patient, seen today for interventional treatment. She  has a past surgical history that includes Tubal ligation; Nasal sinus surgery; Colonoscopy with propofol (N/A, 04/01/2017); Esophagogastroduodenoscopy (egd) with propofol (N/A, 04/01/2017); and Mouth surgery. Samantha Macias has a current medication list which includes the following prescription(s): albuterol, bupropion, diclofenac sodium, tylenol pm extra strength, donepezil, doxepin, formoterol, ipratropium, losartan-hydrochlorothiazide, meloxicam, mirtazapine, omeprazole, pantoprazole, pregabalin, quetiapine, ramelteon, sertraline, spiriva handihaler, tizanidine, anoro ellipta, apple cider vinegar, dhea, hydrocodone-acetaminophen, and [START ON 07/07/2019] hydrocodone-acetaminophen, and the following Facility-Administered Medications: fentanyl and midazolam. Her primarily concern today is the Back Pain (lower)  Initial Vital Signs:  Pulse/HCG Rate: 93ECG Heart Rate: 91 Temp: (!) 97.1 F (36.2 C) Resp: 18 BP: (!) 118/94 SpO2: 96 %  BMI: Estimated body mass index is 29.8 kg/m as calculated from the following:   Height as of this encounter: 5\' 8"  (1.727 m).    Weight as of this encounter: 196 lb (88.9 kg).  Risk Assessment: Allergies: Reviewed. She is allergic to contrast media [iodinated diagnostic agents]; shellfish allergy; tetanus toxoid adsorbed; ace  inhibitors; codeine; nitrofurantoin monohyd macro; and red dye.  Allergy Precautions: None required Coagulopathies: Reviewed. None identified.  Blood-thinner therapy: None at this time Active Infection(s): Reviewed. None identified. Samantha Macias is afebrile  Site Confirmation: Ms. Karmann was asked to confirm the procedure and laterality before marking the site Procedure checklist: Completed Consent: Before the procedure and under the influence of no sedative(s), amnesic(s), or anxiolytics, the patient was informed of the treatment options, risks and possible complications. To fulfill our ethical and legal obligations, as recommended by the American Medical Association's Code of Ethics, I have informed the patient of my clinical impression; the nature and purpose of the treatment or procedure; the risks, benefits, and possible complications of the intervention; the alternatives, including doing nothing; the risk(s) and benefit(s) of the alternative treatment(s) or procedure(s); and the risk(s) and benefit(s) of doing nothing. The patient was provided information about the general risks and possible complications associated with the procedure. These may include, but are not limited to: failure to achieve desired goals, infection, bleeding, organ or nerve damage, allergic reactions, paralysis, and death. In addition, the patient was informed of those risks and complications associated to Spine-related procedures, such as failure to decrease pain; infection (i.e.: Meningitis, epidural or intraspinal abscess); bleeding (i.e.: epidural hematoma, subarachnoid hemorrhage, or any other type of intraspinal or peri-dural bleeding); organ or nerve damage (i.e.: Any type of peripheral nerve, nerve root, or spinal cord  injury) with subsequent damage to sensory, motor, and/or autonomic systems, resulting in permanent pain, numbness, and/or weakness of one or several areas of the body; allergic reactions; (i.e.: anaphylactic reaction); and/or death. Furthermore, the patient was informed of those risks and complications associated with the medications. These include, but are not limited to: allergic reactions (i.e.: anaphylactic or anaphylactoid reaction(s)); adrenal axis suppression; blood sugar elevation that in diabetics may result in ketoacidosis or comma; water retention that in patients with history of congestive heart failure may result in shortness of breath, pulmonary edema, and decompensation with resultant heart failure; weight gain; swelling or edema; medication-induced neural toxicity; particulate matter embolism and blood vessel occlusion with resultant organ, and/or nervous system infarction; and/or aseptic necrosis of one or more joints. Finally, the patient was informed that Medicine is not an exact science; therefore, there is also the possibility of unforeseen or unpredictable risks and/or possible complications that may result in a catastrophic outcome. The patient indicated having understood very clearly. We have given the patient no guarantees and we have made no promises. Enough time was given to the patient to ask questions, all of which were answered to the patient's satisfaction. Ms. Cappelletti has indicated that she wanted to continue with the procedure. Attestation: I, the ordering provider, attest that I have discussed with the patient the benefits, risks, side-effects, alternatives, likelihood of achieving goals, and potential problems during recovery for the procedure that I have provided informed consent. Date  Time: 06/30/2019 12:51 PM  Pre-Procedure Preparation:  Monitoring: As per clinic protocol. Respiration, ETCO2, SpO2, BP, heart rate and rhythm monitor placed and checked for adequate  function Safety Precautions: Patient was assessed for positional comfort and pressure points before starting the procedure. Time-out: I initiated and conducted the "Time-out" before starting the procedure, as per protocol. The patient was asked to participate by confirming the accuracy of the "Time Out" information. Verification of the correct person, site, and procedure were performed and confirmed by me, the nursing staff, and the patient. "Time-out" conducted as per Joint Commission's Universal Protocol (UP.01.01.01). Time:  1326  Description of Procedure:          Laterality: Right Levels:  L2, L3, L4, L5, & S1 Medial Branch Level(s), at the L3-4, L4-5, and the L5-S1 lumbar facet joints. Area Prepped: Lumbosacral Prepping solution: DuraPrep (Iodine Povacrylex [0.7% available iodine] and Isopropyl Alcohol, 74% w/w) Safety Precautions: Aspiration looking for blood return was conducted prior to all injections. At no point did we inject any substances, as a needle was being advanced. Before injecting, the patient was told to immediately notify me if she was experiencing any new onset of "ringing in the ears, or metallic taste in the mouth". No attempts were made at seeking any paresthesias. Safe injection practices and needle disposal techniques used. Medications properly checked for expiration dates. SDV (single dose vial) medications used. After the completion of the procedure, all disposable equipment used was discarded in the proper designated medical waste containers. Local Anesthesia: Protocol guidelines were followed. The patient was positioned over the fluoroscopy table. The area was prepped in the usual manner. The time-out was completed. The target area was identified using fluoroscopy. A 12-in long, straight, sterile hemostat was used with fluoroscopic guidance to locate the targets for each level blocked. Once located, the skin was marked with an approved surgical skin marker. Once all sites  were marked, the skin (epidermis, dermis, and hypodermis), as well as deeper tissues (fat, connective tissue and muscle) were infiltrated with a small amount of a short-acting local anesthetic, loaded on a 10cc syringe with a 25G, 1.5-in  Needle. An appropriate amount of time was allowed for local anesthetics to take effect before proceeding to the next step. Local Anesthetic: Lidocaine 2.0% The unused portion of the local anesthetic was discarded in the proper designated containers. Technical explanation of process:  Radiofrequency Ablation (RFA) L2 Medial Branch Nerve RFA: The target area for the L2 medial branch is at the junction of the postero-lateral aspect of the superior articular process and the superior, posterior, and medial edge of the transverse process of L3. Under fluoroscopic guidance, a Radiofrequency needle was inserted until contact was made with os over the superior postero-lateral aspect of the pedicular shadow (target area). Sensory and motor testing was conducted to properly adjust the position of the needle. Once satisfactory placement of the needle was achieved, the numbing solution was slowly injected after negative aspiration for blood. 2.0 mL of the nerve block solution was injected without difficulty or complication. After waiting for at least 3 minutes, the ablation was performed. Once completed, the needle was removed intact. L3 Medial Branch Nerve RFA: The target area for the L3 medial branch is at the junction of the postero-lateral aspect of the superior articular process and the superior, posterior, and medial edge of the transverse process of L4. Under fluoroscopic guidance, a Radiofrequency needle was inserted until contact was made with os over the superior postero-lateral aspect of the pedicular shadow (target area). Sensory and motor testing was conducted to properly adjust the position of the needle. Once satisfactory placement of the needle was achieved, the numbing  solution was slowly injected after negative aspiration for blood. 2.0 mL of the nerve block solution was injected without difficulty or complication. After waiting for at least 3 minutes, the ablation was performed. Once completed, the needle was removed intact. L4 Medial Branch Nerve RFA: The target area for the L4 medial branch is at the junction of the postero-lateral aspect of the superior articular process and the superior, posterior, and medial edge of the  transverse process of L5. Under fluoroscopic guidance, a Radiofrequency needle was inserted until contact was made with os over the superior postero-lateral aspect of the pedicular shadow (target area). Sensory and motor testing was conducted to properly adjust the position of the needle. Once satisfactory placement of the needle was achieved, the numbing solution was slowly injected after negative aspiration for blood. 2.0 mL of the nerve block solution was injected without difficulty or complication. After waiting for at least 3 minutes, the ablation was performed. Once completed, the needle was removed intact. L5 Medial Branch Nerve RFA: The target area for the L5 medial branch is at the junction of the postero-lateral aspect of the superior articular process of S1 and the superior, posterior, and medial edge of the sacral ala. Under fluoroscopic guidance, a Radiofrequency needle was inserted until contact was made with os over the superior postero-lateral aspect of the pedicular shadow (target area). Sensory and motor testing was conducted to properly adjust the position of the needle. Once satisfactory placement of the needle was achieved, the numbing solution was slowly injected after negative aspiration for blood. 2.0 mL of the nerve block solution was injected without difficulty or complication. After waiting for at least 3 minutes, the ablation was performed. Once completed, the needle was removed intact. S1 Medial Branch Nerve RFA: The target  area for the S1 medial branch is located inferior to the junction of the S1 superior articular process and the L5 inferior articular process, posterior, inferior, and lateral to the 6 o'clock position of the L5-S1 facet joint, just superior to the S1 posterior foramen. Under fluoroscopic guidance, the Radiofrequency needle was advanced until contact was made with os over the Target area. Sensory and motor testing was conducted to properly adjust the position of the needle. Once satisfactory placement of the needle was achieved, the numbing solution was slowly injected after negative aspiration for blood. 2.0 mL of the nerve block solution was injected without difficulty or complication. After waiting for at least 3 minutes, the ablation was performed. Once completed, the needle was removed intact. Radiofrequency lesioning (ablation):  Radiofrequency Generator: NeuroTherm [id number] Sensory Stimulation Parameters: 50 Hz was used to locate & identify the nerve, making sure that the needle was positioned such that there was no sensory stimulation below 0.3 V or above 0.7 V. Motor Stimulation Parameters: 2 Hz was used to evaluate the motor component. Care was taken not to lesion any nerves that demonstrated motor stimulation of the lower extremities at an output of less than 2.5 times that of the sensory threshold, or a maximum of 2.0 V. Lesioning Technique Parameters: Standard Radiofrequency settings. (Not bipolar or pulsed.) Temperature Settings: 80 degrees C Lesioning time: 60 seconds Intra-operative Compliance: Compliant Materials & Medications: Needle(s) (Electrode/Cannula) Type: Teflon-coated, curved tip, Radiofrequency needle(s) Gauge: 22G Length: 10cm Numbing solution: 0.2% PF-Ropivacaine + Triamcinolone (40 mg/mL) diluted to a final concentration of 4 mg of Triamcinolone/mL of Ropivacaine The unused portion of the solution was discarded in the proper designated containers.  Once the entire procedure  was completed, the treated area was cleaned, making sure to leave some of the prepping solution back to take advantage of its long term bactericidal properties.  Illustration of the posterior view of the lumbar spine and the posterior neural structures. Laminae of L2 through S1 are labeled. DPRL5, dorsal primary ramus of L5; DPRS1, dorsal primary ramus of S1; DPR3, dorsal primary ramus of L3; FJ, facet (zygapophyseal) joint L3-L4; I, inferior articular process of L4; LB1, lateral  branch of dorsal primary ramus of L1; IAB, inferior articular branches from L3 medial branch (supplies L4-L5 facet joint); IBP, intermediate branch plexus; MB3, medial branch of dorsal primary ramus of L3; NR3, third lumbar nerve root; S, superior articular process of L5; SAB, superior articular branches from L4 (supplies L4-5 facet joint also); TP3, transverse process of L3.  Vitals:   06/30/19 1355 06/30/19 1402 06/30/19 1412 06/30/19 1423  BP: (!) 112/99 117/81 113/76 104/77  Pulse:      Resp: 13 (!) 24 (!) 29 (!) 22  Temp:  97.8 F (36.6 C)  (!) 97.5 F (36.4 C)  TempSrc:  Temporal  Temporal  SpO2: 97% 97% 97% 99%  Weight:      Height:       Start Time: 1326 hrs. End Time: 1352 hrs.  Imaging Guidance (Spinal):          Type of Imaging Technique: Fluoroscopy Guidance (Spinal) Indication(s): Assistance in needle guidance and placement for procedures requiring needle placement in or near specific anatomical locations not easily accessible without such assistance. Exposure Time: Please see nurses notes. Contrast: None used. Fluoroscopic Guidance: I was personally present during the use of fluoroscopy. "Tunnel Vision Technique" used to obtain the best possible view of the target area. Parallax error corrected before commencing the procedure. "Direction-depth-direction" technique used to introduce the needle under continuous pulsed fluoroscopy. Once target was reached, antero-posterior, oblique, and lateral  fluoroscopic projection used confirm needle placement in all planes. Images permanently stored in EMR. Interpretation: No contrast injected. I personally interpreted the imaging intraoperatively. Adequate needle placement confirmed in multiple planes. Permanent images saved into the patient's record.  Antibiotic Prophylaxis:   Anti-infectives (From admission, onward)   None     Indication(s): None identified  Post-operative Assessment:  Post-procedure Vital Signs:  Pulse/HCG Rate: 9381 Temp: (!) 97.5 F (36.4 C) Resp: (!) 22 BP: 104/77 SpO2: 99 %  EBL: None  Complications: No immediate post-treatment complications observed by team, or reported by patient.  Note: The patient tolerated the entire procedure well. A repeat set of vitals were taken after the procedure and the patient was kept under observation following institutional policy, for this type of procedure. Post-procedural neurological assessment was performed, showing return to baseline, prior to discharge. The patient was provided with post-procedure discharge instructions, including a section on how to identify potential problems. Should any problems arise concerning this procedure, the patient was given instructions to immediately contact us, at any time, without hesitation. In any case, we plan to contact the patient by telephone for a follow-up status report regarding this interventional procedure.  Comments:  No additional relevant information.  Plan of Care  Orders:  Orders Placed This Encounter  Procedures  . Radiofrequency,Lumbar    Scheduling Instructions:     Side(s): Right-sided     Level: L3-4, L4-5, & L5-S1 Facets (L2, L3, L4, L5, & S1 Medial Branch Nerves)     Sedation: With Sedation     Timeframe: Today    Order Specific Question:   Where will this procedure be performed?    Answer:   [facility]  . DG PA[facility] C-ARM 1-60 MIN NO REPORT    Intraoperative interpretation by procedural  physician at [facility].    Standing Status:   Standing    Number of Occurrences:   1    Order Specific Question:   Reason for exam:    Answer:   Assistance in needle guidance and placement for  procedures requiring needle placement in or near specific anatomical locations not easily accessible without such assistance.  . Informed Consent Details: Physician/Practitioner Attestation; Transcribe to consent form and obtain patient signature    Nursing Order: Transcribe to consent form and obtain patient signature. Note: Always confirm laterality of pain with [name], before procedure. Procedure: Lumbar Facet Radiofrequency Ablation Indication/Reason: Low Back Pain, with our without leg pain, due to Facet Joint Arthralgia (Joint Pain) known as Lumbar Facet Syndrome, secondary to Lumbar, and/or Lumbosacral Spondylosis (Arthritis of the Spine), without myelopathy or radiculopathy (Nerve Damage). Provider Attestation: I, McClain Dossie Arbour, MD, (Pain Management Specialist), the physician/practitioner, attest that I have discussed with the patient the benefits, risks, side effects, alternatives, likelihood of achieving goals and potential problems during recovery for the procedure that I have provided informed consent.  . Provide equipment / supplies at bedside    Equipment required: Sterile "Radiofrequency Tray"; Large hemostat (1); Small hemostat (1); Towels (6-8); 4x4 sterile sponge pack (1) Radiofrequency Needle(s): Size: Long Quantity: 5    Standing Status:   Standing    Number of Occurrences:   1    Order Specific Question:   Specify    Answer:   Radiofrequency Tray  . Miscellanous precautions    NOTE: Although It is true that patients can have allergies to shellfish and that shellfish contain iodine, most shellfish  allergies are due to two protein allergens present in the shellfish: tropomyosins and parvalbumin. Not all patients with shellfish allergies are allergic to iodine.  However, as a precaution, avoid using iodine containing products.    Standing Status:   Standing    Number of Occurrences:   1   Chronic Opioid Analgesic:  None provided by our practice. NO OPIOIDS: UDS (08/07/17) (+) for Unreported Benzoylecgonine, a metabolite of cocaine; its presence    indicates use of this drug.  Source is most commonly illicit.   Medications ordered for procedure: Meds ordered this encounter  Medications  . lidocaine (XYLOCAINE) 2 % (with pres) injection 400 mg  . lactated ringers infusion 1,000 mL  . midazolam (VERSED) 5 MG/5ML injection 1-2 mg    Make sure Flumazenil is available in the pyxis when using this medication. If oversedation occurs, administer 0.2 mg IV over 15 sec. If after 45 sec no response, administer 0.2 mg again over 1 min; may repeat at 1 min intervals; not to exceed 4 doses (1 mg)  . fentaNYL (SUBLIMAZE) injection 25-50 mcg    Make sure Narcan is available in the pyxis when using this medication. In the event of respiratory depression (RR< 8/min): Titrate NARCAN (naloxone) in increments of 0.1 to 0.2 mg IV at 2-3 minute intervals, until desired degree of reversal.  . triamcinolone acetonide (KENALOG-40) injection 40 mg  . ropivacaine (PF) 2 mg/mL (0.2%) (NAROPIN) injection 9 mL  . HYDROcodone-acetaminophen (NORCO/VICODIN) 5-325 MG tablet    Sig: Take 1 tablet by mouth every 6 (six) hours as needed for up to 7 days for severe pain. Must last 7 days.    Dispense:  28 tablet    Refill:  0    For acute post-operative pain. Not to be refilled. Must last 7 days.  Marland Kitchen HYDROcodone-acetaminophen (NORCO/VICODIN) 5-325 MG tablet    Sig: Take 1 tablet by mouth every 6 (six) hours as needed for up to 7 days for severe pain. Must last 7 days.    Dispense:  28 tablet    Refill:  0    For acute  post-operative pain. Not to be refilled.  Must last 7 days.   Medications administered: We administered lidocaine, lactated ringers, midazolam, fentaNYL, triamcinolone  acetonide, and ropivacaine (PF) 2 mg/mL (0.2%).  See the medical record for exact dosing, route, and time of administration.  Follow-up plan:   Return in about 6 weeks (around 08/11/2019) for (VV), (PP).       Interventional treatment options:  Under consideration:   NOTE: (IODINE) SHELLFISH & CONTRAST ALLERGY!! (ANAPHYLACTIC)  Therapeutic right lumbar facet RFA #1 (today 06/30/2019)  Diagnostic midline LESI  Possible bilateral thoracic RFA  Diagnostic left IA knee injection    Therapeutic/palliative (PRN):   Diagnostic bilateral cervical facet block #2  Diagnostic bilateral Thoracic T7, T8, T9, & T10  Facet Medial Branch block #2 (80/100/100)  Diagnostic/therapeutic left CESI #2 (100/100/100)  Palliative bilateral lumbar facet block #3  Palliative left lumbar facet RFA #2 (last done 05/19/2019)    Recent Visits Date Type Provider Dept  05/19/19 Procedure visit Milinda Pointer, MD [facility]  04/27/19 Telemedicine Milinda Pointer, MD [facility]  04/14/19 Procedure visit Milinda Pointer, MD [facility]  04/01/19 Office Visit Milinda Pointer, MD [facility]  Showing recent visits within past 90 days and meeting all other requirements   Today's Visits Date Type Provider Dept  06/30/19 Procedure visit Milinda Pointer, MD [facility]  Showing today's visits and meeting all other requirements   Future Appointments Date Type Provider Dept  08/12/19 Appointment Milinda Pointer, MD [facility]  Showing future appointments within next 90 days and meeting all other requirements   Disposition: Discharge home  Discharge (Date  Time): 06/30/2019; 1428 hrs.   Primary Care Physician: Danae Orleans, MD Location: [facility] Note by: Gaspar Cola, MD Date: 06/30/2019; Time: 2:52 PM  Disclaimer:  Medicine is not an Chief Strategy Officer. The only guarantee in medicine is that nothing  is guaranteed. It is important to note that the decision to proceed with this intervention was based on the information collected from the patient. The Data and conclusions were drawn from the patient's questionnaire, the interview, and the physical examination. Because the information was provided in large part by the patient, it cannot be guaranteed that it has not been purposely or unconsciously manipulated. Every effort has been made to obtain as much relevant data as possible for this evaluation. It is important to note that the conclusions that lead to this procedure are derived in large part from the available data. Always take into account that the treatment will also be dependent on availability of resources and existing treatment guidelines, considered by other Pain Management Practitioners as being common knowledge and practice, at the time of the intervention. For Medico-Legal purposes, it is also important to point out that variation in procedural techniques and pharmacological choices are the acceptable norm. The indications, contraindications, technique, and results of the above procedure should only be interpreted and judged by a Board-Certified Interventional Pain Specialist with extensive familiarity and expertise in the same exact procedure and technique.

## 2019-07-01 ENCOUNTER — Encounter: Payer: Self-pay | Admitting: Pain Medicine

## 2019-07-01 ENCOUNTER — Telehealth: Payer: Self-pay | Admitting: Pain Medicine

## 2019-07-01 NOTE — Telephone Encounter (Signed)
Patient informed that she had been sent in 2 separate scripts for Hydrocodone each for 7 days.  States understanding.

## 2019-07-01 NOTE — Telephone Encounter (Signed)
Post procedure phone call. Patient states she is doing good.  

## 2019-07-01 NOTE — Telephone Encounter (Signed)
Patient Samantha Macias stating she thought Dr. Dossie Arbour sent in script for 20 tablets of pain meds for RF after pain. Pharmacy would only give her half of script for 1 week. Please advise what needs to be done.

## 2019-07-13 ENCOUNTER — Encounter: Payer: Self-pay | Admitting: Pain Medicine

## 2019-07-15 ENCOUNTER — Other Ambulatory Visit: Payer: Self-pay | Admitting: Pain Medicine

## 2019-07-15 DIAGNOSIS — G8918 Other acute postprocedural pain: Secondary | ICD-10-CM

## 2019-07-16 ENCOUNTER — Other Ambulatory Visit: Payer: Self-pay

## 2019-07-16 ENCOUNTER — Encounter: Payer: Self-pay | Admitting: Pain Medicine

## 2019-07-16 ENCOUNTER — Ambulatory Visit: Payer: Medicare Other | Attending: Pain Medicine | Admitting: Pain Medicine

## 2019-07-16 DIAGNOSIS — M792 Neuralgia and neuritis, unspecified: Secondary | ICD-10-CM

## 2019-07-16 DIAGNOSIS — M47816 Spondylosis without myelopathy or radiculopathy, lumbar region: Secondary | ICD-10-CM | POA: Diagnosis not present

## 2019-07-16 DIAGNOSIS — M797 Fibromyalgia: Secondary | ICD-10-CM

## 2019-07-16 DIAGNOSIS — M5441 Lumbago with sciatica, right side: Secondary | ICD-10-CM

## 2019-07-16 DIAGNOSIS — M25552 Pain in left hip: Secondary | ICD-10-CM

## 2019-07-16 DIAGNOSIS — M5442 Lumbago with sciatica, left side: Secondary | ICD-10-CM

## 2019-07-16 DIAGNOSIS — G894 Chronic pain syndrome: Secondary | ICD-10-CM

## 2019-07-16 DIAGNOSIS — M546 Pain in thoracic spine: Secondary | ICD-10-CM

## 2019-07-16 DIAGNOSIS — G8929 Other chronic pain: Secondary | ICD-10-CM

## 2019-07-16 MED ORDER — PREGABALIN 200 MG PO CAPS: 200.0000 mg | ORAL_CAPSULE | Freq: Three times a day (TID) | ORAL | 5 refills | Status: DC

## 2019-07-16 NOTE — Progress Notes (Signed)
Pain relief after procedure (treated area only): (Questions asked to patient) 1. Starting about 15 minutes after the procedure, and "while the area was still numb" (from the local anesthetics), were you having any of your usual pain "in that area" (the treated area)?  (NOTE: NOT including the discomfort from the needle sticks.) First 1 hour:100 % better. First 4-6 hours: 100 % better. 2. How long did the numbness from the local anesthetics last? (More than 6 hours?) Duration: 5 hours.  3. How much better is your pain now, when compared to before the procedure? Current benefit: 80 % better. 4. Can you move better now? Improvement in ROM (Range of Motion): No. 5. Can you do more now? Improvement in function: No. 4. Did you have any problems with the procedure? Side-effects/Complications: No.

## 2019-07-16 NOTE — Progress Notes (Signed)
Patient: Samantha Macias  Service Category: E/M  Provider: Gaspar Cola, MD  DOB: 01-08-1961  DOS: 07/16/2019  Location: Office  MRN: [id number]  Setting: Ambulatory outpatient  Referring Provider: Danae Orleans, MD  Type: Established Patient  Specialty: Interventional Pain Management  PCP: Danae Orleans, MD  Location: Remote location  Delivery: TeleHealth     Virtual Encounter - Pain Management PROVIDER NOTE: Information contained herein reflects review and annotations entered in association with encounter. Interpretation of such information and data should be left to medically-trained personnel. Information provided to patient can be located elsewhere in the medical record under "Patient Instructions". Document created using STT-dictation technology, any transcriptional errors that may result from process are unintentional.    Contact & Pharmacy Preferred: [phone] Home: [phone] (home) Mobile: [phone] (mobile) E-mail: [email or web address]  [place], [place] [place] [address] Phone: [phone] Fax: [phone]   Pre-screening  Samantha Macias offered "in-person" vs "virtual" encounter. She indicated preferring virtual for this encounter.   Reason COVID-19*  Social distancing based on CDC and AMA recommendations.   I contacted Samantha Macias on 07/16/2019 via telephone.      I clearly identified myself as Gaspar Cola, MD. I verified that I was speaking with the correct person using two identifiers (Name: Samantha Macias, and date of birth: 11/08/1960).  Consent I sought verbal advanced consent from Samantha Macias for virtual visit interactions. I informed Samantha Macias of possible security and privacy concerns, risks, and limitations associated with providing "not-in-person" medical evaluation and management services. I also informed Samantha Macias of the availability of "in-person"  appointments. Finally, I informed her that there would be a charge for the virtual visit and that she could be  personally, fully or partially, financially responsible for it. Samantha Macias expressed understanding and agreed to proceed.   Historic Elements   Samantha Macias is a 59 y.o. year old, female patient evaluated today after her last contact with our practice on 07/15/2019. Samantha Macias  has a past medical history of Abnormal gait, Allergy, Anxiety, Arthritis, Back pain, Bipolar disorder ([place]), CAD (coronary artery disease), Chronic kidney disease, Collagen vascular disease ([place]), Colon polyp, COPD (chronic obstructive pulmonary disease) ([place]), Falls, Fibromyalgia, GERD (gastroesophageal reflux disease), Hyperlipidemia, Hypertension, IBS (irritable bowel syndrome), Kidney disease, Migraine, Numbness and tingling, Renal insufficiency, Spinal stenosis, Static encephalopathy, Thyroid nodule (07/2017), and TIA (transient ischemic attack). She also  has a past surgical history that includes Tubal ligation; Nasal sinus surgery; Colonoscopy with propofol (N/A, 04/01/2017); Esophagogastroduodenoscopy (egd) with propofol (N/A, 04/01/2017); and Mouth surgery. Samantha Macias has a current medication list which includes the following prescription(s): albuterol, apple cider vinegar, bupropion, diurex, vitamin d, diclofenac sodium, tylenol pm extra strength, donepezil, doxepin, ipratropium, meloxicam, mirtazapine, pantoprazole, quetiapine, ramelteon, sertraline, spiriva handihaler, tizanidine, anoro ellipta, dhea, formoterol, losartan-hydrochlorothiazide, omeprazole, and [START ON 08/15/2019] pregabalin. She  reports that she has been smoking cigarettes. She has a 6.25 pack-year smoking history. She has never used smokeless tobacco. She reports current drug use. Drug: Cocaine. She reports that she does not drink alcohol. Samantha Macias is allergic to contrast media [iodinated diagnostic agents]; shellfish allergy; tetanus  toxoid adsorbed; ace inhibitors; codeine; nitrofurantoin monohyd macro; and red dye.   HPI  Today, she is being contacted for both, medication management and a post-procedure assessment.  The patient was last prescribed pregabalin (Lyrica) 200 mg capsules to be taken 1 tablet p.o. 3 times daily.  The patient indicates doing well with the current medication regimen. No adverse reactions or side effects reported to the medications.  The patient indicates that the recovery.  After the second radiofrequency was a little bit more bumpy than the first one.  Her initial lumbar facet radiofrequency done on the left side on 05/19/2019 (8 weeks ago) it still doing really good with approximately 70% improvement on that left side.  In the case of the right side, he has been only 2 weeks since she had the radiofrequency on 06/30/2019 and she still healing from that one.  At this point she has already attained 50% relief of the right-sided low back pain and she is looking forward to the end of the 6-week postop period, so asked to enjoy the full benefit.  Today she indicates her worst pain to be that of her left hip and the area between her shoulder blades.  We have documented that she has severe cervical facet disease as well as lumbar facet disease and it is very likely that she also has some thoracic facet disease but her last thoracic x-ray was done in 2015.  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  In addition I will also order a repeat x-ray of the left hip since the last one was done in 2019 and interestingly enough they documented that she had some evidence suggestive of a linear fracture of the femoral head.  When I read the results of the patient she indicated that this was news to her and that she was not aware of that.  She is complaining of pain in both hips, but primarily on the  left and therefore today we will also order an x-ray of that hip to follow-up and see if she has any evidence of that old fracture.  I instructed the patient to give me a call as soon as she has completed the x-ray so that we can go over them and determine if there is anything that needs to be done.  Post-Procedure Evaluation  Procedure (07/15/2019): Therapeutic bilateral lumbar facet RFA #1 under fluoroscopic guidance and IV sedation (right side done on 06/30/2019 and left side 05/19/2019) Pre-procedure pain level:  7/10 Post-procedure: 2/10 (> 50% relief)  Sedation: Sedation provided.  Landis Martins, RN  07/16/2019 12:37 PM  Sign when Signing Visit Pain relief after procedure (treated area only): (Questions asked to patient) 1. Starting about 15 minutes after the procedure, and "while the area was still numb" (from the local anesthetics), were you having any of your usual pain "in that area" (the treated area)?  (NOTE: NOT including the discomfort from the needle sticks.) First 1 hour:100 % better. First 4-6 hours: 100 % better. 2. How long did the numbness from the local anesthetics last? (More than 6 hours?) Duration: 5 hours.  3. How much better is your pain now, when compared to before the procedure? Current benefit: 80 % better. 4. Can you move better now? Improvement in ROM (Range of Motion): No. 5. Can you do more now? Improvement in function: No. 4. Did you have any problems with the procedure? Side-effects/Complications: No.  Current benefits: Defined as benefit that persist at this time.   Analgesia:  >75% relief Function: No improvement ROM: No improvement  Pharmacotherapy Assessment  Analgesic: None provided by our practice. NO OPIOIDS: UDS (08/07/17) (+) for Unreported Benzoylecgonine,  a metabolite of cocaine; its presence    indicates use of this drug.  Source is most commonly illicit.   Monitoring: [place] PMP: PDMP reviewed during this encounter.        Pharmacotherapy: No side-effects or adverse reactions reported. Compliance: No problems identified. Effectiveness: Clinically acceptable. Plan: Refer to "POC".  UDS:  Summary  Date Value Ref Range Status  03/17/2018 FINAL  Final    Comment:    ==================================================================== TOXASSURE COMP DRUG ANALYSIS,UR ==================================================================== Test                             Result       Flag       Units Drug Present and Declared for Prescription Verification   Tramadol                       1825         EXPECTED   ng/mg creat   O-Desmethyltramadol            929          EXPECTED   ng/mg creat   N-Desmethyltramadol            1352         EXPECTED   ng/mg creat    Source of tramadol is a prescription medication.    O-desmethyltramadol and N-desmethyltramadol are expected    metabolites of tramadol.   Pregabalin                     PRESENT      EXPECTED   Tizanidine                     PRESENT      EXPECTED   Bupropion                      PRESENT      EXPECTED   Hydroxybupropion               PRESENT      EXPECTED    Hydroxybupropion is an expected metabolite of bupropion.   Mirtazapine                    PRESENT      EXPECTED   Quetiapine                     PRESENT      EXPECTED   Salicylate                     PRESENT      EXPECTED Drug Present not Declared for Prescription Verification   Benzoylecgonine                494          UNEXPECTED ng/mg creat    Benzoylecgonine is a metabolite of cocaine; its presence    indicates use of this drug.  Source is most commonly illicit, but    cocaine is present in some topical anesthetic solutions.   Acetaminophen                  PRESENT      UNEXPECTED   Diphenhydramine                PRESENT      UNEXPECTED Drug Absent but Declared for  Prescription Verification   Alprazolam                     Not Detected UNEXPECTED ng/mg creat   Sertraline                      Not Detected UNEXPECTED   Diclofenac                     Not Detected UNEXPECTED    Topical diclofenac, as indicated in the declared medication list,    is not always detected even when used as directed.   Ibuprofen                      Not Detected UNEXPECTED    Ibuprofen, as indicated in the declared medication list, is not    always detected even when used as directed. ==================================================================== Test                      Result    Flag   Units      Ref Range   Creatinine              159              mg/dL      >=20 ==================================================================== Declared Medications:  The flagging and interpretation on this report are based on the  following declared medications.  Unexpected results may arise from  inaccuracies in the declared medications.  **Note: The testing scope of this panel includes these medications:  Alprazolam (Xanax)  Bupropion (Wellbutrin)  Mirtazapine (Remeron)  Pregabalin (Lyrica)  Quetiapine (Seroquel)  Sertraline (Zoloft)  Tramadol (Ultram)  **Note: The testing scope of this panel does not include small to  moderate amounts of these reported medications:  Aspirin (Aspirin 81)  Ibuprofen  Tizanidine (Zanaflex)  Topical Diclofenac  **Note: The testing scope of this panel does not include following  reported medications:  Albuterol  Donepezil (Aricept)  Formoterol (Perforomist)  Hydrochlorothiazide (Hyzaar)  Ipratropium  Losartan (Hyzaar)  Omeprazole (Prilosec)  Pantoprazole (Protonix)  Ramelteon (Rozerem)  Tiotropium (Spiriva)  Vitamin D ==================================================================== For clinical consultation, please call [phone]. ====================================================================    Laboratory Chemistry Profile   Renal Lab Results  Component Value Date   BUN 22 03/17/2018   CREATININE 1.35 (H) 03/17/2018   BCR 16  03/17/2018   GFRAA 51 (L) 03/17/2018   GFRNONAA 44 (L) 03/17/2018    Hepatic Lab Results  Component Value Date   AST 13 03/17/2018   ALT 15 12/24/2017   ALBUMIN 4.4 03/17/2018   ALKPHOS 148 (H) 03/17/2018   LIPASE 37 12/24/2017   AMMONIA 23 07/07/2015    Electrolytes Lab Results  Component Value Date   NA 141 03/17/2018   K 4.3 03/17/2018   CL 99 03/17/2018   CALCIUM 9.1 03/17/2018   MG 1.8 03/17/2018    Bone Lab Results  Component Value Date   25OHVITD1 41 03/17/2018   25OHVITD2 <1.0 03/17/2018   25OHVITD3 41 03/17/2018    Coagulation Lab Results  Component Value Date   INR 0.91 07/10/2017   LABPROT 12.2 07/10/2017   APTT 27 07/10/2017   PLT 224 12/24/2017    Cardiovascular Lab Results  Component Value Date   BNP 135.0 (H) 11/08/2016   CKTOTAL 47 02/10/2014   CKMB 0.6 02/10/2014   TROPONINI <0.03 11/08/2016   HGB 13.4 12/24/2017   HCT 38.5 12/24/2017  Inflammation (CRP: Acute Phase) (ESR: Chronic Phase) Lab Results  Component Value Date   CRP 17 (H) 03/17/2018   ESRSEDRATE 28 03/17/2018      Note: Above Lab results reviewed.  Imaging  DG PAIN CLINIC C-ARM 1-60 MIN NO REPORT Fluoro was used, but no Radiologist interpretation will be provided.  Please refer to "NOTES" tab for provider progress note.  Assessment  The primary encounter diagnosis was Chronic low back pain (Primary Area of Pain) (Bilateral) (L>R) w/ sciatica. Diagnoses of Lumbar facet syndrome (Bilateral), Chronic pain syndrome, Fibromyalgia syndrome, Neurogenic pain, Chronic midline thoracic back pain, and Chronic hip pain (Left) were also pertinent to this visit.  Plan of Care  Problem-specific:  No problem-specific Assessment & Plan notes found for this encounter.  I am having Ebbie Ridge. Caliendo maintain her losartan-hydrochlorothiazide, diclofenac sodium, ramelteon, tiZANidine, donepezil, omeprazole, Spiriva HandiHaler, buPROPion, mirtazapine, sertraline, QUEtiapine, pantoprazole,  albuterol, formoterol, ipratropium, Tylenol PM Extra Strength, doxepin, meloxicam, Anoro Ellipta, Apple Cider Vinegar, DHEA, pregabalin, Diurex, and Vitamin D.  Pharmacotherapy (Medications Ordered): Meds ordered this encounter  Medications  . pregabalin (LYRICA) 200 MG capsule    Sig: Take 1 capsule (200 mg total) by mouth 3 (three) times daily.    Dispense:  90 capsule    Refill:  5    Fill one day early if pharmacy is closed on scheduled refill date. May substitute for generic if available.   Orders:  Orders Placed This Encounter  Procedures  . DG Thoracic Spine 2 View    Patient presents with axial pain with possible radicular component.  In addition to any acute findings, please report on:  1. Facet (Zygapophyseal) joint DJD (Hypertrophy, space narrowing, subchondral sclerosis, and/or osteophyte formation) 2. DDD and/or IVDD (Loss of disc height, desiccation or "Black disc disease") 3. Pars defects 4. Spondylolisthesis, spondylosis, and/or spondyloarthropathies (include Degree/Grade of displacement in mm) 5. Vertebral body Fractures, including age (old, new/acute) 52. Modic Type Changes 7. Demineralization 8. Bone pathology 9. Central, Lateral Recess, and/or Foraminal Stenosis (include AP diameter of stenosis in mm) 10. Surgical changes (hardware type, status, and presence of fibrosis) NOTE: Please specify level(s) and laterality.    Standing Status:   Future    Standing Expiration Date:   07/15/2020    Order Specific Question:   Reason for Exam (SYMPTOM  OR DIAGNOSIS REQUIRED)    Answer:   Upper back pain and/or thoracic spine pain.    Order Specific Question:   Is the patient pregnant?    Answer:   No    Order Specific Question:   Preferred imaging location?    Answer:   [facility]    Order Specific Question:   Call Results- Best Contact Number?    Answer:   [phone] (Patient Clinic facility) (Dr. Dossie Arbour)  . DG HIP UNILAT W OR W/O PELVIS 2-3 VIEWS LEFT     Standing Status:   Future    Standing Expiration Date:   07/15/2020    Scheduling Instructions:     Please describe any evidence of DJD, such as joint narrowing, asymmetry, cysts, or any anomalies in bone density, production, or erosion.    Order Specific Question:   Reason for Exam (SYMPTOM  OR DIAGNOSIS REQUIRED)    Answer:   Upper back pain and/or thoracic spine pain.    Order Specific Question:   Is the patient pregnant?    Answer:   No    Order Specific Question:   Preferred imaging location?  Answer:   [facility]    Order Specific Question:   Call Results- Best Contact Number?    Answer:   [phone] (Pain Clinic facility) (Dr. Dossie Arbour)   Follow-up plan:   Return for (VV), (s/p Tests).      Interventional treatment options:  Under consideration:   NOTE: (IODINE) SHELLFISH & CONTRAST ALLERGY!! (ANAPHYLACTIC)  Therapeutic right lumbar facet RFA #1 (today 06/30/2019)  Diagnostic midline LESI  Possible bilateral thoracic RFA  Diagnostic left IA knee injection    Therapeutic/palliative (PRN):   Diagnostic bilateral cervical facet block #2  Diagnostic bilateral Thoracic T7, T8, T9, & T10  Facet Medial Branch block #2 (80/100/100)  Diagnostic/therapeutic left CESI #2 (100/100/100)  Palliative bilateral lumbar facet block #3  Palliative left lumbar facet RFA #2 (last done 05/19/2019)    Recent Visits Date Type Provider Dept  06/30/19 Procedure visit Milinda Pointer, MD [facility]  05/19/19 Procedure visit Milinda Pointer, MD [facility]  04/27/19 Telemedicine Milinda Pointer, MD [facility]  Showing recent visits within past 90 days and meeting all other requirements   Today's Visits Date Type Provider Dept  07/16/19 Telemedicine Milinda Pointer, MD [facility]  Showing today's visits and meeting all other requirements   Future Appointments Date Type Provider Dept  08/12/19 Appointment Milinda Pointer, MD  [facility]  Showing future appointments within next 90 days and meeting all other requirements   I discussed the assessment and treatment plan with the patient. The patient was provided an opportunity to ask questions and all were answered. The patient agreed with the plan and demonstrated an understanding of the instructions.  Patient advised to call back or seek an in-person evaluation if the symptoms or condition worsens.  Duration of encounter: 15 minutes.  Note by: Gaspar Cola, MD Date: 07/16/2019; Time: 3:08 PM

## 2019-07-19 ENCOUNTER — Encounter: Payer: Self-pay | Admitting: Pain Medicine

## 2019-07-20 ENCOUNTER — Telehealth: Payer: Self-pay | Admitting: Pain Medicine

## 2019-07-20 ENCOUNTER — Encounter: Payer: Self-pay | Admitting: Pain Medicine

## 2019-07-20 NOTE — Telephone Encounter (Signed)
Patient has broken foot and went to [facility] urgent care, they told her to call here for any pain medications, they did give her 21 tablets 50mg  of tramadol. She says this is not helping with broken foot. Would like to get better pain control.  Please call patient and let her know what options she has

## 2019-07-24 ENCOUNTER — Encounter: Payer: Self-pay | Admitting: Pain Medicine

## 2019-07-28 ENCOUNTER — Encounter: Payer: Self-pay | Admitting: Pain Medicine

## 2019-07-28 DIAGNOSIS — S92909A Unspecified fracture of unspecified foot, initial encounter for closed fracture: Secondary | ICD-10-CM | POA: Insufficient documentation

## 2019-07-28 NOTE — Progress Notes (Signed)
Patient: Samantha Macias  Service Category: E/M  Provider: Gaspar Cola, MD  DOB: [birth year]  DOS: 07/29/2019  Location: Office  MRN: [id number]  Setting: Ambulatory outpatient  Referring Provider: Danae Orleans, MD  Type: Established Patient  Specialty: Interventional Pain Management  PCP: Danae Orleans, MD  Location: Remote location  Delivery: TeleHealth     Virtual Encounter - Pain Management PROVIDER NOTE: Information contained herein reflects review and annotations entered in association with encounter. Interpretation of such information and data should be left to medically-trained personnel. Information provided to patient can be located elsewhere in the medical record under "Patient Instructions". Document created using STT-dictation technology, any transcriptional errors that may result from process are unintentional.    Contact & Pharmacy Preferred: [phone] Home: [phone] (home) Mobile: [phone] (mobile) E-mail: [email or web address]  [place] [place], [place]. [place] [address] Phone: [phone] Fax: [phone]   Pre-screening  [name] offered "in-person" vs "virtual" encounter. She indicated preferring virtual for this encounter.   Reason COVID-19*  Social distancing based on CDC and AMA recommendations.   I contacted [name] on 07/29/2019 via telephone.      I clearly identified myself as Gaspar Cola, MD. I verified that I was speaking with the correct person using two identifiers (Name: Farah Lepak, and date of birth: February 16, 1961).  Consent I sought verbal advanced consent from [name] for virtual visit interactions. I informed Ms. Hovland of possible security and privacy concerns, risks, and limitations associated with providing "not-in-person" medical evaluation and management services. I also informed Ms. Rhyne of the availability of "in-person"  appointments. Finally, I informed her that there would be a charge for the virtual visit and that she could be  personally, fully or partially, financially responsible for it. Ms. Goette expressed understanding and agreed to proceed.   Historic Elements   Ms. Mella Inclan is a 59 y.o. year old, female patient evaluated today after her last contact with our practice on 07/20/2019. Ms. Rigsbee  has a past medical history of Abnormal gait, Allergy, Anxiety, Arthritis, Back pain, Bipolar disorder ([place]), CAD (coronary artery disease), Chronic kidney disease, Collagen vascular disease ([place]), Colon polyp, COPD (chronic obstructive pulmonary disease) ([place]), Falls, Fibromyalgia, GERD (gastroesophageal reflux disease), Hyperlipidemia, Hypertension, IBS (irritable bowel syndrome), Kidney disease, Migraine, Numbness and tingling, Renal insufficiency, Spinal stenosis, Static encephalopathy, Thyroid nodule (07/2017), and TIA (transient ischemic attack). She also  has a past surgical history that includes Tubal ligation; Nasal sinus surgery; Colonoscopy with propofol (N/A, 04/01/2017); Esophagogastroduodenoscopy (egd) with propofol (N/A, 04/01/2017); and Mouth surgery. Ms. Twardowski has a current medication list which includes the following prescription(s): albuterol, apple cider vinegar, bupropion, diurex, vitamin d, dhea, diclofenac sodium, tylenol pm extra strength, donepezil, doxepin, formoterol, ipratropium, losartan-hydrochlorothiazide, meloxicam, mirtazapine, omeprazole, pantoprazole, [START ON 08/15/2019] pregabalin, quetiapine, ramelteon, sertraline, spiriva handihaler, tizanidine, and anoro ellipta. She  reports that she has been smoking cigarettes. She has a 6.25 pack-year smoking history. She has never used smokeless tobacco. She reports current drug use. Drug: Cocaine. She reports that she does not drink alcohol. Ms. Kimble is allergic to contrast media [iodinated diagnostic agents]; shellfish allergy; tetanus  toxoid adsorbed; ace inhibitors; codeine; nitrofurantoin monohyd macro; and red dye.   HPI  Today, she is being contacted for follow-up evaluation.  On the last visit, I ordered x-rays of her left hip and thoracic spine which she did not complete.  The patient wanted me to  know that she fell and broke her foot and that she thinks it may have something to do with her hip problems.  However, because she had not done the x-rays by the time she fell, I do not have any information or feedback to give her on that.  She wanted to see if I could call some pain medication for the acute pain from her foot, which of course I declined since we will not be engaging in prescribing any opioids for her long-term.  She indicated that she still having some low back pain on the right side, but then again the radiofrequency ablation was done on 06/30/2019 and it has not been 6 weeks since it was completed.  I told her that for the time being, she needed to simply hang in there.  I also encouraged her to get the x-rays done so that I can provide her with a little bit more information about what is going on with her hip.  She understood and accepted.  Pharmacotherapy Assessment  Analgesic: None provided by our practice. NO OPIOIDS: UDS (08/07/17) (+) for Unreported Benzoylecgonine, a metabolite of cocaine; its presence    indicates use of this drug.  Source is most commonly illicit.   Monitoring: [place] PMP: PDMP reviewed during this encounter.       Pharmacotherapy: No side-effects or adverse reactions reported. Compliance: No problems identified. Effectiveness: Clinically acceptable. Plan: Refer to "POC".  UDS:  Summary  Date Value Ref Range Status  03/17/2018 FINAL  Final    Comment:    ==================================================================== TOXASSURE COMP DRUG ANALYSIS,UR ==================================================================== Test                             Result       Flag        Units Drug Present and Declared for Prescription Verification   Tramadol                       1825         EXPECTED   ng/mg creat   O-Desmethyltramadol            929          EXPECTED   ng/mg creat   N-Desmethyltramadol            1352         EXPECTED   ng/mg creat    Source of tramadol is a prescription medication.    O-desmethyltramadol and N-desmethyltramadol are expected    metabolites of tramadol.   Pregabalin                     PRESENT      EXPECTED   Tizanidine                     PRESENT      EXPECTED   Bupropion                      PRESENT      EXPECTED   Hydroxybupropion               PRESENT      EXPECTED    Hydroxybupropion is an expected metabolite of bupropion.   Mirtazapine                    PRESENT      EXPECTED  Quetiapine                     PRESENT      EXPECTED   Salicylate                     PRESENT      EXPECTED Drug Present not Declared for Prescription Verification   Benzoylecgonine                494          UNEXPECTED ng/mg creat    Benzoylecgonine is a metabolite of cocaine; its presence    indicates use of this drug.  Source is most commonly illicit, but    cocaine is present in some topical anesthetic solutions.   Acetaminophen                  PRESENT      UNEXPECTED   Diphenhydramine                PRESENT      UNEXPECTED Drug Absent but Declared for Prescription Verification   Alprazolam                     Not Detected UNEXPECTED ng/mg creat   Sertraline                     Not Detected UNEXPECTED   Diclofenac                     Not Detected UNEXPECTED    Topical diclofenac, as indicated in the declared medication list,    is not always detected even when used as directed.   Ibuprofen                      Not Detected UNEXPECTED    Ibuprofen, as indicated in the declared medication list, is not    always detected even when used as directed. ==================================================================== Test                      Result     Flag   Units      Ref Range   Creatinine              159              mg/dL      >=20 ==================================================================== Declared Medications:  The flagging and interpretation on this report are based on the  following declared medications.  Unexpected results may arise from  inaccuracies in the declared medications.  **Note: The testing scope of this panel includes these medications:  Alprazolam (Xanax)  Bupropion (Wellbutrin)  Mirtazapine (Remeron)  Pregabalin (Lyrica)  Quetiapine (Seroquel)  Sertraline (Zoloft)  Tramadol (Ultram)  **Note: The testing scope of this panel does not include small to  moderate amounts of these reported medications:  Aspirin (Aspirin 81)  Ibuprofen  Tizanidine (Zanaflex)  Topical Diclofenac  **Note: The testing scope of this panel does not include following  reported medications:  Albuterol  Donepezil (Aricept)  Formoterol (Perforomist)  Hydrochlorothiazide (Hyzaar)  Ipratropium  Losartan (Hyzaar)  Omeprazole (Prilosec)  Pantoprazole (Protonix)  Ramelteon (Rozerem)  Tiotropium (Spiriva)  Vitamin D ==================================================================== For clinical consultation, please call [phone]. ====================================================================    Laboratory Chemistry Profile   Renal Lab Results  Component Value Date   BUN 22 03/17/2018   CREATININE 1.35 (H) 03/17/2018  BCR 16 03/17/2018   GFRAA 51 (L) 03/17/2018   GFRNONAA 44 (L) 03/17/2018    Hepatic Lab Results  Component Value Date   AST 13 03/17/2018   ALT 15 12/24/2017   ALBUMIN 4.4 03/17/2018   ALKPHOS 148 (H) 03/17/2018   LIPASE 37 12/24/2017   AMMONIA 23 07/07/2015    Electrolytes Lab Results  Component Value Date   NA 141 03/17/2018   K 4.3 03/17/2018   CL 99 03/17/2018   CALCIUM 9.1 03/17/2018   MG 1.8 03/17/2018    Bone Lab Results  Component Value Date   25OHVITD1 41  03/17/2018   25OHVITD2 <1.0 03/17/2018   25OHVITD3 41 03/17/2018    Inflammation (CRP: Acute Phase) (ESR: Chronic Phase) Lab Results  Component Value Date   CRP 17 (H) 03/17/2018   ESRSEDRATE 28 03/17/2018      Note: Above Lab results reviewed.  Imaging  DG PAIN CLINIC C-ARM 1-60 MIN NO REPORT Fluoro was used, but no Radiologist interpretation will be provided.  Please refer to "NOTES" tab for provider progress note.  Assessment  The primary encounter diagnosis was Chronic low back pain (Primary Area of Pain) (Bilateral) (L>R) w/ sciatica. Diagnoses of Closed fracture of left foot, initial encounter, Chronic lower extremity pain (Secondary Area of Pain) (Bilateral) (L>R), and Chronic upper back pain (Tertiary Area of Pain) (Bilateral) (R>L) were also pertinent to this visit.  Plan of Care  Problem-specific:  No problem-specific Assessment & Plan notes found for this encounter.  Ms. Magdalena Skilton has a current medication list which includes the following long-term medication(s): albuterol, bupropion, tylenol pm extra strength, donepezil, doxepin, formoterol, ipratropium, losartan-hydrochlorothiazide, mirtazapine, omeprazole, pantoprazole, [START ON 08/15/2019] pregabalin, ramelteon, sertraline, and spiriva handihaler.  Pharmacotherapy (Medications Ordered): No orders of the defined types were placed in this encounter.  Orders:  No orders of the defined types were placed in this encounter.  Follow-up plan:   No follow-ups on file.      Interventional treatment options:  Under consideration:   NOTE: (IODINE) SHELLFISH & CONTRAST ALLERGY!! (ANAPHYLACTIC)  Diagnostic midline LESI  Possible bilateral thoracic RFA  Diagnostic left IA knee injection    Therapeutic/palliative (PRN):   Diagnostic bilateral cervical facet block #2  Diagnostic bilateral Thoracic T7, T8, T9, & T10 Facet Medial Branch block #2 (80/100/100)  Diagnostic/therapeutic left CESI #2 (100/100/100)   Palliative bilateral lumbar facet block #3  Palliative left lumbar facet RFA #2 (last done 05/19/2019)  Palliative right lumbar facet RFA #1 (last done 06/30/2019)     Recent Visits Date Type Provider Dept  07/16/19 Telemedicine Milinda Pointer, MD [facility]  06/30/19 Procedure visit Milinda Pointer, MD [facility]  05/19/19 Procedure visit Milinda Pointer, MD [facility]  Showing recent visits within past 90 days and meeting all other requirements   Today's Visits Date Type Provider Dept  07/29/19 Telemedicine Milinda Pointer, MD [facility]  Showing today's visits and meeting all other requirements   Future Appointments Date Type Provider Dept  08/12/19 Appointment Milinda Pointer, MD [facility]  Showing future appointments within next 90 days and meeting all other requirements   I discussed the assessment and treatment plan with the patient. The patient was provided an opportunity to ask questions and all were answered. The patient agreed with the plan and demonstrated an understanding of the instructions.  Patient advised to call back or seek an in-person evaluation if the symptoms or condition worsens.  Duration of encounter: 13  minutes.  Note by: Gaspar Cola, MD Date: 07/29/2019; Time: 10:53 AM

## 2019-07-29 ENCOUNTER — Ambulatory Visit: Payer: Medicare Other | Attending: Pain Medicine | Admitting: Pain Medicine

## 2019-07-29 ENCOUNTER — Other Ambulatory Visit: Payer: Self-pay

## 2019-07-29 DIAGNOSIS — S92902A Unspecified fracture of left foot, initial encounter for closed fracture: Secondary | ICD-10-CM | POA: Diagnosis not present

## 2019-07-29 DIAGNOSIS — M5441 Lumbago with sciatica, right side: Secondary | ICD-10-CM

## 2019-07-29 DIAGNOSIS — M5442 Lumbago with sciatica, left side: Secondary | ICD-10-CM | POA: Diagnosis not present

## 2019-07-29 DIAGNOSIS — M549 Dorsalgia, unspecified: Secondary | ICD-10-CM | POA: Diagnosis not present

## 2019-07-29 DIAGNOSIS — M79605 Pain in left leg: Secondary | ICD-10-CM

## 2019-07-29 DIAGNOSIS — M79604 Pain in right leg: Secondary | ICD-10-CM | POA: Diagnosis not present

## 2019-07-29 DIAGNOSIS — G8929 Other chronic pain: Secondary | ICD-10-CM

## 2019-08-11 ENCOUNTER — Telehealth: Payer: Self-pay

## 2019-08-11 ENCOUNTER — Encounter: Payer: Self-pay | Admitting: Pain Medicine

## 2019-08-11 NOTE — Progress Notes (Signed)
Patient: Samantha Macias  Service Category: E/M  Provider: Gaspar Cola, MD  DOB: Mar 21, 1961  DOS: 08/12/2019  Location: Office  MRN: [id number]  Setting: Ambulatory outpatient  Referring Provider: Danae Orleans, MD  Type: Established Patient  Specialty: Interventional Pain Management  PCP: Danae Orleans, MD  Location: Remote location  Delivery: TeleHealth     Virtual Encounter - Pain Management PROVIDER NOTE: Information contained herein reflects review and annotations entered in association with encounter. Interpretation of such information and data should be left to medically-trained personnel. Information provided to patient can be located elsewhere in the medical record under "Patient Instructions". Document created using STT-dictation technology, any transcriptional errors that may result from process are unintentional.    Contact & Pharmacy Preferred: [phone] Home: [phone] (home) Mobile: [phone] (mobile) E-mail: [email or web address]  [place], [address] [place] [address] Phone: [phone] Fax: [phone]   Pre-screening  Samantha Macias offered "in-person" vs "virtual" encounter. She indicated preferring virtual for this encounter.   Reason COVID-19*  Social distancing based on CDC and AMA recommendations.   I contacted Samantha Macias on 08/12/2019 via telephone.      I clearly identified myself as Gaspar Cola, MD. I verified that I was speaking with the correct person using two identifiers (Name: Samantha Macias, and date of birth: [birth year]).  Consent I sought verbal advanced consent from Samantha Macias for virtual visit interactions. I informed Samantha Macias of possible security and privacy concerns, risks, and limitations associated with providing "not-in-person" medical evaluation and management services. I also informed Samantha Macias of the availability of "in-person"  appointments. Finally, I informed her that there would be a charge for the virtual visit and that she could be  personally, fully or partially, financially responsible for it. Samantha Macias expressed understanding and agreed to proceed.   Historic Elements   Samantha Macias is a 59 y.o. year old, female patient evaluated today after her last contact with our practice on 08/11/2019. Samantha Macias  has a past medical history of Abnormal gait, Allergy, Anxiety, Arthritis, Back pain, Bipolar disorder ([place]), CAD (coronary artery disease), Chronic kidney disease, Collagen vascular disease ([place]), Colon polyp, COPD (chronic obstructive pulmonary disease) ([place]), Falls, Fibromyalgia, GERD (gastroesophageal reflux disease), Hyperlipidemia, Hypertension, IBS (irritable bowel syndrome), Kidney disease, Migraine, Numbness and tingling, Renal insufficiency, Spinal stenosis, Static encephalopathy, Thyroid nodule (07/2017), and TIA (transient ischemic attack). She also  has a past surgical history that includes Tubal ligation; Nasal sinus surgery; Colonoscopy with propofol (N/A, 04/01/2017); Esophagogastroduodenoscopy (egd) with propofol (N/A, 04/01/2017); and Mouth surgery. Samantha Macias has a current medication list which includes the following prescription(s): albuterol, bupropion, diurex, vitamin d, diclofenac sodium, tylenol pm extra strength, donepezil, doxepin, hydrocodone-acetaminophen, losartan-hydrochlorothiazide, meloxicam, mirtazapine, pantoprazole, [START ON 08/15/2019] pregabalin, quetiapine, ramelteon, sertraline, spiriva handihaler, tizanidine, and anoro ellipta. She  reports that she has been smoking cigarettes. She has a 6.25 pack-year smoking history. She has never used smokeless tobacco. She reports current drug use. Drug: Cocaine. She reports that she does not drink alcohol. Samantha Macias is allergic to contrast media [iodinated diagnostic agents]; shellfish allergy; tetanus toxoid adsorbed; ace inhibitors; codeine;  nitrofurantoin monohyd macro; and red dye.   HPI  Today, she is being contacted for a post-procedure assessment.  Overall, the patient indicates having attained 95% benefit on the left side of her lower back and approximately 35% improvement on the right.  She blames her recent fall and  left foot fracture for this inequality.  At this point, since he is not able to put any weight on her foot, she is limited as to what is it that she can do.  Because of this, we will put her treatment on hold until she is cleared from that fracture.  Post-Procedure Evaluation  Procedure (06/30/2019): Therapeutic right-sided lumbar facet RFA #1 under fluoroscopic guidance and IV sedation (right side done on 06/30/2019 and left side 05/19/2019) Pre-procedure pain level:  7/10 Post-procedure: 2/10 (> 50% relief)  Sedation: Sedation provided.  Dewayne Shorter, RN  08/12/2019  8:12 AM  Signed Pain relief after procedure (treated area only): (Questions asked to patient) 1. Starting about 15 minutes after the procedure, and "while the area was still numb" (from the local anesthetics), were you having any of your usual pain "in that area" (the treated area)?  (NOTE: NOT including the discomfort from the needle sticks.) First 1 hour: 100 % better. First 4-6 hours: 100 % better.   3. How much better is your pain now, when compared to before the procedure? Current benefit: 95 % better on the left and 35% on the right.  The patient blames her recent left foot fracture on the fact that she has had to limp and stay in bed since she was told that she could not bear any weight on that left foot until it heals.  He feels that if it was not for the event associated with the foot fracture, he would have obtained 60 to 70% relief of the pain on the right side, since it was on the same track as the left.  Unfortunately, she did fracture that left foot and that has changed her postoperative period. 4. Can you move better now? Improvement in  ROM (Range of Motion): Yes. 5. Can you do more now? Improvement in function: Yes. 4. Did you have any problems with the procedure? Side-effects/Complications: No.  Current benefits: Defined as benefit that persist at this time.   Analgesia:  The initial report that the patient provided to our nursing staff indicated that her current benefit from the procedure was only 20%.  However, after having talked to the patient and assist the situation further, she had been including a recent fall and the fracture of her left foot in the assessment.  Once I clarified to the patient that we were interested in how her back pain was doing, then she clarified that compared to the original pain that she was experiencing, the left side had approximately 95% benefit from the radiofrequency ablation and on the right side, currently she had about 35% benefit.  She went on to further explained that she believed the reason why her right side was not doing better was because of the fall that she suffered and the fact that she has been limping and this has caused some compensatory right sided low back pain.  She feels that if it was not for that fall, by now she would be experiencing 60 to 70% improvement on the right side as well.  Unfortunately, the type of fracture that she suffered on her left foot is 1 where she could not bear any weight and she has spent most of her time in bed and she indicates being very sore because of this. Function: Ms. Dix reports improvement in function ROM: Ms. Thaker reports improvement in ROM  Pharmacotherapy Assessment  Analgesic: None provided by our practice. NO OPIOIDS: UDS (08/07/17) (+) for Unreported Benzoylecgonine, a metabolite  of cocaine; its presence    indicates use of this drug.  Source is most commonly illicit.   Monitoring: [place] PMP: PDMP reviewed during this encounter.       Pharmacotherapy: No side-effects or adverse reactions reported. Compliance: No problems  identified. Effectiveness: Clinically acceptable. Plan: Refer to "POC".  UDS:  Summary  Date Value Ref Range Status  03/17/2018 FINAL  Final    Comment:    ==================================================================== [facility]RE COMP DRUG ANALYSIS,UR ==================================================================== Test                             Result       Flag       Units Drug Present and Declared for Prescription Verification   Tramadol                       1825         EXPECTED   ng/mg creat   O-Desmethyltramadol            929          EXPECTED   ng/mg creat   N-Desmethyltramadol            1352         EXPECTED   ng/mg creat    Source of tramadol is a prescription medication.    O-desmethyltramadol and N-desmethyltramadol are expected    metabolites of tramadol.   Pregabalin                     PRESENT      EXPECTED   Tizanidine                     PRESENT      EXPECTED   Bupropion                      PRESENT      EXPECTED   Hydroxybupropion               PRESENT      EXPECTED    Hydroxybupropion is an expected metabolite of bupropion.   Mirtazapine                    PRESENT      EXPECTED   Quetiapine                     PRESENT      EXPECTED   Salicylate                     PRESENT      EXPECTED Drug Present not Declared for Prescription Verification   Benzoylecgonine                494          UNEXPECTED ng/mg creat    Benzoylecgonine is a metabolite of cocaine; its presence    indicates use of this drug.  Source is most commonly illicit, but    cocaine is present in some topical anesthetic solutions.   Acetaminophen                  PRESENT      UNEXPECTED   Diphenhydramine                PRESENT      UNEXPECTED Drug Absent but Declared for Prescription Verification  Alprazolam                     Not Detected UNEXPECTED ng/mg creat   Sertraline                     Not Detected UNEXPECTED   Diclofenac                     Not Detected UNEXPECTED     Topical diclofenac, as indicated in the declared medication list,    is not always detected even when used as directed.   Ibuprofen                      Not Detected UNEXPECTED    Ibuprofen, as indicated in the declared medication list, is not    always detected even when used as directed. ==================================================================== Test                      Result    Flag   Units      Ref Range   Creatinine              159              mg/dL      >=20 ==================================================================== Declared Medications:  The flagging and interpretation on this report are based on the  following declared medications.  Unexpected results may arise from  inaccuracies in the declared medications.  **Note: The testing scope of this panel includes these medications:  Alprazolam (Xanax)  Bupropion (Wellbutrin)  Mirtazapine (Remeron)  Pregabalin (Lyrica)  Quetiapine (Seroquel)  Sertraline (Zoloft)  Tramadol (Ultram)  **Note: The testing scope of this panel does not include small to  moderate amounts of these reported medications:  Aspirin (Aspirin 81)  Ibuprofen  Tizanidine (Zanaflex)  Topical Diclofenac  **Note: The testing scope of this panel does not include following  reported medications:  Albuterol  Donepezil (Aricept)  Formoterol (Perforomist)  Hydrochlorothiazide (Hyzaar)  Ipratropium  Losartan (Hyzaar)  Omeprazole (Prilosec)  Pantoprazole (Protonix)  Ramelteon (Rozerem)  Tiotropium (Spiriva)  Vitamin D ==================================================================== For clinical consultation, please call [phone]. ====================================================================    Laboratory Chemistry Profile   Renal Lab Results  Component Value Date   BUN 22 03/17/2018   CREATININE 1.35 (H) 03/17/2018   BCR 16 03/17/2018   GFRAA 51 (L) 03/17/2018   GFRNONAA 44 (L) 03/17/2018    Hepatic Lab Results   Component Value Date   AST 13 03/17/2018   ALT 15 12/24/2017   ALBUMIN 4.4 03/17/2018   ALKPHOS 148 (H) 03/17/2018   LIPASE 37 12/24/2017   AMMONIA 23 07/07/2015    Electrolytes Lab Results  Component Value Date   NA 141 03/17/2018   K 4.3 03/17/2018   CL 99 03/17/2018   CALCIUM 9.1 03/17/2018   MG 1.8 03/17/2018    Bone Lab Results  Component Value Date   25OHVITD1 41 03/17/2018   25OHVITD2 <1.0 03/17/2018   25OHVITD3 41 03/17/2018    Inflammation (CRP: Acute Phase) (ESR: Chronic Phase) Lab Results  Component Value Date   CRP 17 (H) 03/17/2018   ESRSEDRATE 28 03/17/2018      Note: Above Lab results reviewed.  Imaging  DG PAIN CLINIC C-ARM 1-60 MIN NO REPORT Fluoro was used, but no Radiologist interpretation will be provided.  Please refer to "NOTES" tab for provider progress note.  Assessment  The primary encounter diagnosis was Chronic  low back pain (Primary Area of Pain) (Bilateral) (L>R) w/ sciatica. Diagnoses of Chronic lower extremity pain (Secondary Area of Pain) (Bilateral) (L>R), Chronic upper back pain (Tertiary Area of Pain) (Bilateral) (R>L), and Chronic hand pain (Fourth Area of Pain) (Bilateral) (R>L) were also pertinent to this visit.  Plan of Care  Problem-specific:  No problem-specific Assessment & Plan notes found for this encounter.  Ms. Aneisa Karren has a current medication list which includes the following long-term medication(s): albuterol, bupropion, tylenol pm extra strength, donepezil, doxepin, losartan-hydrochlorothiazide, mirtazapine, pantoprazole, [START ON 08/15/2019] pregabalin, ramelteon, sertraline, and spiriva handihaler.  Pharmacotherapy (Medications Ordered): No orders of the defined types were placed in this encounter.  Orders:  No orders of the defined types were placed in this encounter.  Follow-up plan:   Return if symptoms worsen or fail to improve.      Interventional treatment options:  Under consideration:    NOTE: (IODINE) SHELLFISH & CONTRAST ALLERGY!! (ANAPHYLACTIC)  Diagnostic midline LESI  Possible bilateral thoracic RFA  Diagnostic left IA knee injection    Therapeutic/palliative (PRN):   Diagnostic bilateral cervical facet block #2  Diagnostic bilateral Thoracic T7, T8, T9, & T10 Facet Medial Branch block #2 (80/100/100)  Diagnostic/therapeutic left CESI #2 (100/100/100)  Palliative bilateral lumbar facet block #3  Palliative left lumbar facet RFA #2 (last done 05/19/2019)  Palliative right lumbar facet RFA #1 (last done 06/30/2019)      Recent Visits Date Type Provider Dept  07/29/19 Telemedicine Milinda Pointer, [facility]  07/16/19 Telemedicine Milinda Pointer, MD [facility]  06/30/19 Procedure visit Milinda Pointer, MD [facility]  05/19/19 Procedure visit Milinda Pointer, MD [facility]  Showing recent visits within past 90 days and meeting all other requirements   Today's Visits Date Type Provider Dept  08/12/19 Telemedicine Milinda Pointer, MD [facility]  Showing today's visits and meeting all other requirements   Future Appointments No visits were found meeting these conditions.  Showing future appointments within next 90 days and meeting all other requirements   I discussed the assessment and treatment plan with the patient. The patient was provided an opportunity to ask questions and all were answered. The patient agreed with the plan and demonstrated an understanding of the instructions.  Patient advised to call back or seek an in-person evaluation if the symptoms or condition worsens.  Duration of encounter: 18 minutes.  Note by: Gaspar Cola, MD Date: 08/12/2019; Time: 9:12 AM

## 2019-08-11 NOTE — Telephone Encounter (Signed)
Pt returned nurses call to go over medication for her appt tomorrow.

## 2019-08-11 NOTE — Telephone Encounter (Signed)
LM for patient to call us back for pre virtual appointment questions.   

## 2019-08-12 ENCOUNTER — Ambulatory Visit: Payer: Medicare Other | Attending: Pain Medicine | Admitting: Pain Medicine

## 2019-08-12 ENCOUNTER — Telehealth: Payer: Self-pay

## 2019-08-12 ENCOUNTER — Other Ambulatory Visit: Payer: Self-pay

## 2019-08-12 DIAGNOSIS — M5442 Lumbago with sciatica, left side: Secondary | ICD-10-CM | POA: Diagnosis not present

## 2019-08-12 DIAGNOSIS — M549 Dorsalgia, unspecified: Secondary | ICD-10-CM | POA: Diagnosis not present

## 2019-08-12 DIAGNOSIS — M79641 Pain in right hand: Secondary | ICD-10-CM

## 2019-08-12 DIAGNOSIS — G8929 Other chronic pain: Secondary | ICD-10-CM

## 2019-08-12 DIAGNOSIS — M79605 Pain in left leg: Secondary | ICD-10-CM

## 2019-08-12 DIAGNOSIS — M5441 Lumbago with sciatica, right side: Secondary | ICD-10-CM | POA: Diagnosis not present

## 2019-08-12 DIAGNOSIS — M79642 Pain in left hand: Secondary | ICD-10-CM

## 2019-08-12 NOTE — Progress Notes (Signed)
Pain relief after procedure (treated area only): (Questions asked to patient) 1. Starting about 15 minutes after the procedure, and "while the area was still numb" (from the local anesthetics), were you having any of your usual pain "in that area" (the treated area)?  (NOTE: NOT including the discomfort from the needle sticks.) First 1 hour: 100 % better. First 4-6 hours: 100 % better.   3. How much better is your pain now, when compared to before the procedure? Current benefit: 20 % better. 4. Can you move better now? Improvement in ROM (Range of Motion): No. 5. Can you do more now? Improvement in function: No. 4. Did you have any problems with the procedure? Side-effects/Complications: No.

## 2019-08-12 NOTE — Telephone Encounter (Signed)
Attempted 2 more times this am to reach patient before virtual appointment.

## 2019-08-12 NOTE — Telephone Encounter (Signed)
Attempted to call patient for pp eval questions.  There is no voicemail box.

## 2019-08-28 ENCOUNTER — Other Ambulatory Visit: Payer: Self-pay

## 2019-08-28 ENCOUNTER — Ambulatory Visit: Payer: Medicare Other | Attending: Internal Medicine

## 2019-08-28 DIAGNOSIS — Z23 Encounter for immunization: Secondary | ICD-10-CM

## 2019-08-28 NOTE — Progress Notes (Signed)
   Covid-19 Vaccination Clinic  Name:  Samantha Macias    MRN: [id number] DOB: Apr 26, 1961  08/28/2019  [name] was observed post Covid-19 immunization for 15 minutes without incident. She was provided with Vaccine Information Sheet and instruction to access the V-Safe system.   [name] was instructed to call 911 with any severe reactions post vaccine: Marland Kitchen Difficulty breathing  . Swelling of face and throat  . A fast heartbeat  . A bad rash all over body  . Dizziness and weakness   Immunizations Administered    Name Date Dose VIS Date Route   Pfizer COVID-19 Vaccine 08/28/2019  1:20 PM 0.3 mL 05/15/2019 Intramuscular   Manufacturer: [place]   Lot: [id number]   [place]: [id number]

## 2019-09-06 ENCOUNTER — Encounter: Payer: Self-pay | Admitting: Pain Medicine

## 2019-09-22 ENCOUNTER — Ambulatory Visit
Admission: RE | Admit: 2019-09-22 | Discharge: 2019-09-22 | Disposition: A | Discharge status: Home / Self Care | Payer: Medicare Other | Source: Ambulatory Visit | Attending: Pain Medicine | Admitting: Pain Medicine

## 2019-09-22 ENCOUNTER — Ambulatory Visit: Payer: Medicare Other | Attending: Internal Medicine

## 2019-09-22 DIAGNOSIS — M546 Pain in thoracic spine: Secondary | ICD-10-CM | POA: Diagnosis present

## 2019-09-22 DIAGNOSIS — M25552 Pain in left hip: Secondary | ICD-10-CM | POA: Diagnosis present

## 2019-09-22 DIAGNOSIS — G8929 Other chronic pain: Secondary | ICD-10-CM

## 2019-09-22 DIAGNOSIS — Z23 Encounter for immunization: Secondary | ICD-10-CM

## 2019-09-22 NOTE — Progress Notes (Signed)
   Covid-19 Vaccination Clinic  Name:  Samantha Macias    MRN: [id number] DOB: [birth year]  09/22/2019  [name] was observed post Covid-19 immunization for 15 minutes without incident. She was provided with Vaccine Information Sheet and instruction to access the V-Safe system.   [name] was instructed to call 911 with any severe reactions post vaccine: Marland Kitchen Difficulty breathing  . Swelling of face and throat  . A fast heartbeat  . A bad rash all over body  . Dizziness and weakness   Immunizations Administered    Name Date Dose VIS Date Route   Pfizer COVID-19 Vaccine 09/22/2019  1:07 PM 0.3 mL 07/29/2018 Intramuscular   Manufacturer: [place]   Lot: [id number]   [place]: [id number]

## 2019-10-07 ENCOUNTER — Telehealth: Payer: Self-pay

## 2019-10-07 NOTE — Telephone Encounter (Signed)
She wants to be put on the wait list. She has appt on 10/27/19

## 2019-10-13 ENCOUNTER — Ambulatory Visit: Payer: Medicare Other | Admitting: Pain Medicine

## 2019-10-16 ENCOUNTER — Encounter: Payer: Self-pay | Admitting: Pain Medicine

## 2019-10-20 ENCOUNTER — Telehealth: Payer: Self-pay | Admitting: *Deleted

## 2019-10-20 NOTE — Telephone Encounter (Signed)
Yes, she just needs to make sure she addresses the MD with it.

## 2019-10-20 NOTE — Telephone Encounter (Signed)
Called patient and informed her.

## 2019-10-21 ENCOUNTER — Other Ambulatory Visit: Payer: Self-pay | Admitting: Internal Medicine

## 2019-10-21 DIAGNOSIS — J449 Chronic obstructive pulmonary disease, unspecified: Secondary | ICD-10-CM

## 2019-10-21 DIAGNOSIS — L819 Disorder of pigmentation, unspecified: Secondary | ICD-10-CM

## 2019-10-21 DIAGNOSIS — Z720 Tobacco use: Secondary | ICD-10-CM

## 2019-10-21 DIAGNOSIS — F4310 Post-traumatic stress disorder, unspecified: Secondary | ICD-10-CM

## 2019-10-26 NOTE — Progress Notes (Signed)
PROVIDER NOTE: Information contained herein reflects review and annotations entered in association with encounter. Interpretation of such information and data should be left to medically-trained personnel. Information provided to patient can be located elsewhere in the medical record under "Patient Instructions". Document created using STT-dictation technology, any transcriptional errors that may result from process are unintentional.    Patient: Samantha Macias  Service Category: E/M  Provider: Gaspar Cola, MD  DOB: [birth year]  DOS: 10/27/2019  Referring Provider: Danae Orleans, MD  MRN: [id number]  Setting: Ambulatory outpatient  PCP: Tracie Harrier, MD  Type: Established Patient  Specialty: Interventional Pain Management    Location: Office  Delivery: Face-to-face     Primary Reason(s) for Visit: Evaluation of chronic illnesses with exacerbation, or progression (Level of risk: moderate) CC: Foot Pain (left), Knee Pain (left), and Hip Pain (left)  HPI  Samantha Macias is a 59 y.o. year old, female patient, who comes today for a follow-up evaluation. She has Allergic rhinitis; Anemia in chronic illness; GAD (generalized anxiety disorder); Bipolar affective disorder, currently depressed, mild ([place]); Atherosclerosis of left carotid artery; Bilateral carpal tunnel syndrome; CKD (chronic kidney disease) stage 3, GFR 30-59 ml/min ([place]); Insomnia, persistent; Chronic pain syndrome; CN (constipation); Kidney cysts; Cervical osteoarthritis; Dyslipidemia; Fibromyalgia syndrome; Abnormal gait; Gastro-esophageal reflux disease without esophagitis; History of colon polyps; History of syncope; H/O transient cerebral ischemia; HPV test positive; Recurrent falls; Calculus of kidney; Lumbar radiculopathy; OCD (obsessive compulsive disorder); Overweight; PTSD (post-traumatic stress disorder); Trochanteric bursitis of hip (Bilateral); Hyperparathyroidism, secondary renal ([place]); Type III hyperlipoproteinemia;  Benign essential HTN; Osteoarthritis of both hands; Thyromegaly; Thyroid cyst; Connective tissue disease ([place]); Arthralgia of multiple joints; Vitamin D deficiency; HLD (hyperlipidemia); Anxiety; Acute pain of left knee; Anterior chest wall pain; Cardiac syncope; Chest pain; Precordial pain; Cigarette nicotine dependence without complication; COPD, moderate ([place]); Chronic hip pain (Left); Lightheadedness; Loss of memory; Mild cognitive disorder; Mild cognitive impairment, so stated; Sleeping difficulty; Tobacco user; Cervical myelopathy ([place]); DDD (degenerative disc disease), cervical; Cervical spondylosis with myelopathy; Hypertension, benign; Insomnia secondary to depression with anxiety; Chronic low back pain (Primary Area of Pain) (Bilateral) (L>R) w/ sciatica; Chronic lower extremity pain (Secondary Area of Pain) (Bilateral) (L>R); Chronic upper back pain [facility] Area of Pain) (Bilateral) (R>L); Chronic hand pain (Fourth Area of Pain) (Bilateral) (R>L); Chronic knee pain (Left); Pharmacologic therapy; Disorder of skeletal system; Problems influencing health status; Long term current use of opiate analgesic; Abnormal MRI, cervical spine; Abnormal MRI, lumbar spine; DDD (degenerative disc disease), lumbar; Lumbar facet hypertrophy (Bilateral); Lumbar facet syndrome (Bilateral); Cervical facet hypertrophy (Bilateral); Cervical facet syndrome (Bilateral); Cervical foraminal stenosis; Lumbar foraminal stenosis; Cervical central spinal stenosis; Lumbar central spinal stenosis; Anterolisthesis; Retrolisthesis; Preop testing; Other specified spondylopathies, lumbosacral region [facility]); Abnormal findings on diagnostic imaging of other parts of musculoskeletal system; Hallucinations, visual; Imbalance; Swelling of both hands; Spondylosis without myelopathy or radiculopathy, cervical region; Cigarette nicotine dependence with nicotine-induced disorder; Memory loss or impairment; Thoracic facet syndrome; Spondylosis  without myelopathy or radiculopathy, thoracic region; Dementia ([place]); SLE (systemic lupus erythematosus related syndrome) ([place]); Vitamin B 12 deficiency; Cervicalgia (Bilateral); History of allergy to shellfish; Chronic low back pain (Bilateral) (L>R) w/o sciatica; Spondylosis without myelopathy or radiculopathy, lumbosacral region; History of allergy to radiographic contrast media; History of anaphylaxis; Acute postoperative pain; Malaise and fatigue; Chronic midline low back pain with left-sided sciatica; Panic anxiety syndrome; Neurogenic pain; Chronic midline thoracic back pain; Broken foot; and CRPS 1 (complex regional pain syndrome I) of lower limb (Left) on their  problem list. Samantha Macias was last seen on 07/20/2019. Her primarily concern today is the Foot Pain (left), Knee Pain (left), and Hip Pain (left)  Pain Assessment: Location: Left Foot Radiating: radiates from top of foot around to back and slightly up the back of leg Onset: More than a month ago Duration: Chronic pain Quality: (contracting, tingling) Severity: 8 /10 (subjective, self-reported pain score)  Note: Reported level is compatible with observation.                         When using our objective Pain Scale, levels between 6 and 10/10 are said to belong in an emergency room, as it progressively worsens from a 6/10, described as severely limiting, requiring emergency care not usually available at an outpatient pain management facility. At a 6/10 level, communication becomes difficult and requires great effort. Assistance to reach the emergency department may be required. Facial flushing and profuse sweating along with potentially dangerous increases in heart rate and blood pressure will be evident. Effect on ADL: limits activities Timing: Constant Modifying factors: denies BP: (!) 128/93  HR: 98  Around July 19, 2019 the patient fractured her left foot.  However approximately 2 to 3 weeks ago she began to experience pain in  the left foot and she was told that perhaps it is an RSD/CRPS.  The patient indicates having pain over the top of the foot, the medial aspect in the back of the foot but the pain does not follow a nondermatomal, stocking-like distribution.  In addition, the patient does not describe the pain as a burning sensation.  As she was describing her symptoms, it would seem that she was reading from an RSD text.  She was talking about skin smoothness, losing hair, swelling, color changes, and temperature changes, which upon attempting to correlate clinically, there were all minimal.  In the case of the temperature, her right foot was 31.9 C while the left was 33.8 C.  Typically the patient asked must have a 4 degree difference between sides, for it to be significant.  The patient does have some swelling of the left foot, but this could also be compatible with the fracture that she had and the fact that she still walking on that foot.  Pain seems to be more localized right over the fracture but she also had some hyperalgesia over the top of the foot, while not having any other sensory abnormalities in any other parts of the foot.  The distribution of the pain was primarily over the top of the foot, but very little in the bottom of her foot.  Please see below for details in the evaluation using AMA diagnostic criteria, based on the [place] CRPS diagnostic criteria.  At this point, we will be giving the patient a prescription for a steroid pack and we will have her return for a diagnostic left-sided lumbar sympathetic block #1 under fluoroscopic guidance and IV sedation.  CRPS/RSD Assessment (AMA Diagnostic Criteria)   Subjective criteria:   1). Continuing pain, which is disproportionate to any inciting event. Present   2). Must report at least 1 symptom in 3 of the 4 following categories:  Sensory: Reports of hyperesthesia and/or allodynia. Present  Vasomotor: Reports of temperature asymmetry and/or skin color  changes and/or skin color asymmetry. Present  Sudomotor/Edema: Reports of edema and/or sweating changes and/or sweating asymmetry. Present  Motor/Trophic: Reports of decreased range of motion and/or motor dysfunction (weakness, tremor, dystonia) and/or trophic changes (  hair, nail, skin). Present  Objective criteria:   3). Must display at least 1 sign at time of evaluation in 2 or more of the following categories:  Sensory: Evidence of hyperalgesia (to pinprick) and/or allodynia (to light touch and/or deep somatic pressure and/or joint movement). Present  Vasomotor: Evidence of temperature asymmetry and/or skin color changes and/or asymmetry. increased redness Present  Sudomotor/Edema: Evidence of edema and/or sweating changes and/or sweating asymmetry. Edema (1).  1 Point  Motor/Trophic: Evidence of decreased range of motion and/or motor dysfunction (weakness, tremor, dystonia) and/or trophic changes (hair, nail, skin). Skin texture: Smooth, nonelastic (1). Joint stiffness and decreased passive motion (1). Hair growth changes: fallout, longer, finer (1).  3 Points  Radiographic signs: No tests available Absent   4). There is no other diagnosis that better explains the signs and symptoms.   Objective criteria Legend   Number of Points Class  <4 0  > or equal to 4 1  > or equal to 6 2  > or equal to 8 3-4   Pharmacotherapy Assessment  Analgesic: None provided by our practice. NO OPIOIDS: UDS (08/07/17) (+) for Unreported Benzoylecgonine, a metabolite of cocaine; its presence    indicates use of this drug.  Source is most commonly illicit.   Monitoring: [place] PMP: PDMP reviewed during this encounter.       Pharmacotherapy: No side-effects or adverse reactions reported. Compliance: No problems identified. Effectiveness: Clinically acceptable. Plan: Refer to "POC".  UDS:  Summary  Date Value Ref Range Status  03/17/2018 FINAL  Final    Comment:     ==================================================================== [facility] COMP DRUG ANALYSIS,UR ==================================================================== Test                             Result       Flag       Units Drug Present and Declared for Prescription Verification   Tramadol                       1825         EXPECTED   ng/mg creat   O-Desmethyltramadol            929          EXPECTED   ng/mg creat   N-Desmethyltramadol            1352         EXPECTED   ng/mg creat    Source of tramadol is a prescription medication.    O-desmethyltramadol and N-desmethyltramadol are expected    metabolites of tramadol.   Pregabalin                     PRESENT      EXPECTED   Tizanidine                     PRESENT      EXPECTED   Bupropion                      PRESENT      EXPECTED   Hydroxybupropion               PRESENT      EXPECTED    Hydroxybupropion is an expected metabolite of bupropion.   Mirtazapine                    PRESENT  EXPECTED   Quetiapine                     PRESENT      EXPECTED   Salicylate                     PRESENT      EXPECTED Drug Present not Declared for Prescription Verification   Benzoylecgonine                494          UNEXPECTED ng/mg creat    Benzoylecgonine is a metabolite of cocaine; its presence    indicates use of this drug.  Source is most commonly illicit, but    cocaine is present in some topical anesthetic solutions.   Acetaminophen                  PRESENT      UNEXPECTED   Diphenhydramine                PRESENT      UNEXPECTED Drug Absent but Declared for Prescription Verification   Alprazolam                     Not Detected UNEXPECTED ng/mg creat   Sertraline                     Not Detected UNEXPECTED   Diclofenac                     Not Detected UNEXPECTED    Topical diclofenac, as indicated in the declared medication list,    is not always detected even when used as directed.   Ibuprofen                      Not Detected  UNEXPECTED    Ibuprofen, as indicated in the declared medication list, is not    always detected even when used as directed. ==================================================================== Test                      Result    Flag   Units      Ref Range   Creatinine              159              mg/dL      >=20 ==================================================================== Declared Medications:  The flagging and interpretation on this report are based on the  following declared medications.  Unexpected results may arise from  inaccuracies in the declared medications.  **Note: The testing scope of this panel includes these medications:  Alprazolam (Xanax)  Bupropion (Wellbutrin)  Mirtazapine (Remeron)  Pregabalin (Lyrica)  Quetiapine (Seroquel)  Sertraline (Zoloft)  Tramadol (Ultram)  **Note: The testing scope of this panel does not include small to  moderate amounts of these reported medications:  Aspirin (Aspirin 81)  Ibuprofen  Tizanidine (Zanaflex)  Topical Diclofenac  **Note: The testing scope of this panel does not include following  reported medications:  Albuterol  Donepezil (Aricept)  Formoterol (Perforomist)  Hydrochlorothiazide (Hyzaar)  Ipratropium  Losartan (Hyzaar)  Omeprazole (Prilosec)  Pantoprazole (Protonix)  Ramelteon (Rozerem)  Tiotropium (Spiriva)  Vitamin D ==================================================================== For clinical consultation, please call [phone]. ====================================================================    Laboratory Chemistry Profile   Renal Lab Results  Component Value Date   BUN 22 03/17/2018   CREATININE 1.35 (H)  03/17/2018   BCR 16 03/17/2018   GFRAA 51 (L) 03/17/2018   GFRNONAA 44 (L) 03/17/2018   SPECGRAV 1.015 12/20/2014   PHUR 5.0 12/20/2014   PROTEINUR NEGATIVE 12/24/2017     Electrolytes Lab Results  Component Value Date   NA 141 03/17/2018   K 4.3 03/17/2018   CL 99  03/17/2018   CALCIUM 9.1 03/17/2018   MG 1.8 03/17/2018     Hepatic Lab Results  Component Value Date   AST 13 03/17/2018   ALT 15 12/24/2017   ALBUMIN 4.4 03/17/2018   ALKPHOS 148 (H) 03/17/2018   LIPASE 37 12/24/2017   AMMONIA 23 07/07/2015     ID Lab Results  Component Value Date   SARSCOV2NAA NOT DETECTED 12/01/2018   STAPHAUREUS NEGATIVE 07/10/2017   MRSAPCR NEGATIVE 07/10/2017     Bone Lab Results  Component Value Date   25OHVITD1 41 03/17/2018   25OHVITD2 <1.0 03/17/2018   25OHVITD3 41 03/17/2018     Endocrine Lab Results  Component Value Date   GLUCOSE 97 03/17/2018   GLUCOSEU NEGATIVE 12/24/2017   HGBA1C 5.6 07/06/2015   TSH 0.49 02/23/2014     Neuropathy Lab Results  Component Value Date   VITAMINB12 268 03/17/2018   HGBA1C 5.6 07/06/2015     CNS No results found for: COLORCSF, APPEARCSF, RBCCOUNTCSF, WBCCSF, POLYSCSF, LYMPHSCSF, EOSCSF, PROTEINCSF, GLUCCSF, JCVIRUS, CSFOLI, IGGCSF, LABACHR, ACETBL, LABACHR, ACETBL   Inflammation (CRP: Acute  ESR: Chronic) Lab Results  Component Value Date   CRP 17 (H) 03/17/2018   ESRSEDRATE 28 03/17/2018     Rheumatology No results found for: RF, ANA, LABURIC, URICUR, LYMEIGGIGMAB, LYMEABIGMQN, HLAB27   Coagulation Lab Results  Component Value Date   INR 0.91 07/10/2017   LABPROT 12.2 07/10/2017   APTT 27 07/10/2017   PLT 224 12/24/2017     Cardiovascular Lab Results  Component Value Date   BNP 135.0 (H) 11/08/2016   CKTOTAL 47 02/10/2014   CKMB 0.6 02/10/2014   TROPONINI <0.03 11/08/2016   HGB 13.4 12/24/2017   HCT 38.5 12/24/2017     Screening Lab Results  Component Value Date   SARSCOV2NAA NOT DETECTED 12/01/2018   COVIDSOURCE NASOPHARYNGEAL 12/01/2018   STAPHAUREUS NEGATIVE 07/10/2017   MRSAPCR NEGATIVE 07/10/2017     Cancer No results found for: CEA, CA125, LABCA2   Allergens No results found for: ALMOND, APPLE, ASPARAGUS, AVOCADO, BANANA, BARLEY, BASIL, BAYLEAF, GREENBEAN,  LIMABEAN, WHITEBEAN, BEEFIGE, REDBEET, BLUEBERRY, BROCCOLI, CABBAGE, MELON, CARROT, CASEIN, CASHEWNUT, CAULIFLOWER, CELERY     Note: Lab results reviewed.   Imaging Review  Cervical Imaging: Cervical MR wo contrast:  Results for orders placed during the hospital encounter of 04/15/19  MR CERVICAL SPINE WO CONTRAST   Narrative CLINICAL DATA:  Worsening chronic neck, shoulder and arm pain, more severe on the left.  EXAM: MRI CERVICAL SPINE WITHOUT CONTRAST  TECHNIQUE: Multiplanar, multisequence MR imaging of the cervical spine was performed. No intravenous contrast was administered.  COMPARISON:  MRI lumbar spine 05/17/2017.  FINDINGS: Alignment: 0.3 cm anterolisthesis C3 on C4 and C4 on C5, unchanged. Trace anterolisthesis C7 on T1 is also unchanged.  Vertebrae: No fracture, evidence of discitis, or bone lesion.  Cord: Normal signal throughout.  Posterior Fossa, vertebral arteries, paraspinal tissues: Negative.  Disc levels:  C2-3: Severe right and mild-to-moderate left facet degenerative disease appears unchanged. There is uncovertebral spurring on the right. Moderate to moderately severe right foraminal narrowing is unchanged. The left foramen is open. The left foramen and central canal  are open.  C3-4: Right worse than left facet degenerative disease appears unchanged. The disc is uncovered with a shallow bulge but the central canal is open. Moderate foraminal narrowing is worse on the right. No change compared to the prior MRI.  C4-5: Severe facet degenerative disease on the left is again seen. The disc is uncovered with a shallow bulge and there is left worse than right uncovertebral spurring. Severe left and mild-to-moderate right foraminal narrowing is seen. The central canal is open. No change.  C5-6: Right worse than left facet degenerative change with mild posterior bony ridging and some uncovertebral disease. Moderate right foraminal narrowing is  unchanged. The central canal and left foramen are open.  C6-7: Disc osteophyte complex and uncovertebral disease are seen. There is mild-to-moderate bilateral facet degenerative change. Moderately severe to severe bilateral foraminal narrowing is seen. The ventral cord is slightly flattened. No change compared to the prior MRI.  C7-T1: Bilateral facet arthropathy.  No stenosis.  IMPRESSION: No marked change in the appearance of the cervical spine since the comparison MRI.  Multilevel foraminal narrowing due to advanced facet arthropathy and uncovertebral spurring is moderate to moderately severe on the right at C2-3, moderate on the right at C3-4, severe on the left at C4-5 and moderate on the right at C5-6.  No change in mild flattening of the ventral cord and moderately severe to severe bilateral foraminal narrowing at C6-7.   Electronically Signed   By: Inge Rise M.D.   On: 04/16/2019 06:45    Cervical MR wo contrast: No procedure found. Cervical MR w/wo contrast: No results found for this or any previous visit. Cervical MR w contrast: No results found for this or any previous visit. Cervical CT wo contrast: No results found for this or any previous visit. Cervical CT w/wo contrast: No results found for this or any previous visit. Cervical CT w/wo contrast: No results found for this or any previous visit. Cervical CT w contrast: No results found for this or any previous visit. Cervical CT outside: No results found for this or any previous visit. Cervical DG 1 view: No results found for this or any previous visit. Cervical DG 2-3 views: No results found for this or any previous visit. Cervical DG F/E views: No results found for this or any previous visit. Cervical DG 2-3 clearing views: No results found for this or any previous visit. Cervical DG Bending/F/E views: No results found for this or any previous visit. Cervical DG complete: No results found for this or  any previous visit. Cervical DG Myelogram views: No results found for this or any previous visit. Cervical DG Myelogram views: No results found for this or any previous visit. Cervical Discogram views: No results found for this or any previous visit.  Shoulder Imaging: Shoulder-R MR w contrast: No results found for this or any previous visit. Shoulder-L MR w contrast: No results found for this or any previous visit. Shoulder-R MR w/wo contrast: No results found for this or any previous visit. Shoulder-L MR w/wo contrast: No results found for this or any previous visit. Shoulder-R MR wo contrast: No results found for this or any previous visit. Shoulder-L MR wo contrast: No results found for this or any previous visit. Shoulder-R CT w contrast: No results found for this or any previous visit. Shoulder-L CT w contrast: No results found for this or any previous visit. Shoulder-R CT w/wo contrast: No results found for this or any previous visit. Shoulder-L CT w/wo  contrast: No results found for this or any previous visit. Shoulder-R CT wo contrast: No results found for this or any previous visit. Shoulder-L CT wo contrast: No results found for this or any previous visit. Shoulder-R DG Arthrogram: No results found for this or any previous visit. Shoulder-L DG Arthrogram: No results found for this or any previous visit. Shoulder-R DG 1 view: No results found for this or any previous visit. Shoulder-L DG 1 view: No results found for this or any previous visit. Shoulder-R DG: No results found for this or any previous visit. Shoulder-L DG: No results found for this or any previous visit.  Thoracic Imaging: Thoracic MR wo contrast: No results found for this or any previous visit. Thoracic MR wo contrast: No procedure found. Thoracic MR w/wo contrast: No results found for this or any previous visit. Thoracic MR w contrast: No results found for this or any previous visit. Thoracic CT wo contrast:  No results found for this or any previous visit. Thoracic CT w/wo contrast: No results found for this or any previous visit. Thoracic CT w/wo contrast: No results found for this or any previous visit. Thoracic CT w contrast: No results found for this or any previous visit. Thoracic DG 2-3 views:  Results for orders placed during the hospital encounter of 09/22/19  DG Thoracic Spine 2 View   Narrative CLINICAL DATA:  59 year old female with back pain.  EXAM: THORACIC SPINE 2 VIEWS  COMPARISON:  Thoracic spine radiograph dated 02/10/2014.  FINDINGS: There is no acute fracture or subluxation of the thoracic spine. The visualized posterior elements appear intact. The soft tissues are unremarkable.  IMPRESSION: No acute/traumatic thoracic spine pathology.   Electronically Signed   By: Anner Crete M.D.   On: 09/22/2019 22:54    Thoracic DG 4 views: No results found for this or any previous visit. Thoracic DG: No results found for this or any previous visit. Thoracic DG w/swimmers view: No results found for this or any previous visit. Thoracic DG Myelogram views: No results found for this or any previous visit. Thoracic DG Myelogram views: No results found for this or any previous visit.  Lumbosacral Imaging: Lumbar MR wo contrast:  Results for orders placed during the hospital encounter of 04/15/19  MR LUMBAR SPINE WO CONTRAST   Narrative CLINICAL DATA:  Worsening central and left low back pain for 1 year. Left worse than right hip and leg pain.  EXAM: MRI LUMBAR SPINE WITHOUT CONTRAST  TECHNIQUE: Multiplanar, multisequence MR imaging of the lumbar spine was performed. No intravenous contrast was administered.  COMPARISON:  MRI lumbar spine 03/15/2016.  FINDINGS: Segmentation:  Standard.  Alignment:  Trace retrolisthesis L5 on S1 is unchanged.  Vertebrae: No fracture, evidence of discitis, or bone lesion. Mild degenerative endplate signal change posteriorly at  L1-2 and to the left at L5-S1 is unchanged.  Conus medullaris and cauda equina: Conus extends to the L1 level. Conus and cauda equina appear normal.  Paraspinal and other soft tissues: Small left renal cyst noted.  Disc levels:  T12-L1: There is a shallow left paracentral protrusion with cephalad extension which is new since the prior MRI. The disc mildly indents the ventral thecal sac. No nerve root compression is identified. The foramina are widely patent.  L1-2: Shallow right paracentral protrusion without central canal or foraminal stenosis, unchanged.  L2-3: Moderate facet arthropathy. Small annular fissure in the left foramen is identified. The central canal and foramina remain open. No change.  L3-4: Negative.  L4-5: Bilateral facet degenerative disease is worse on the right and shows some progression since the prior examination. Mild marrow edema in the L4 and L5 pedicles is consistent with secondary stress change. There is a shallow broad-based disc bulge and some ligamentum flavum thickening. There is mild to moderate central canal stenosis and mild narrowing in the subarticular recesses. Mild bilateral foraminal narrowing is also present.  L5-S1: Moderate bilateral facet degenerative disease is more advanced on the left. There is a shallow disc bulge eccentric to the left. Moderate left foraminal narrowing is unchanged. The central canal and right foramen are open.  IMPRESSION: There has been some progression of spondylosis at L4-5 since the prior MRI where there is now mild to moderate central canal stenosis and mild narrowing in the subarticular recesses and foramina bilaterally. Facet arthropathy at L4-5 has also progressed with new very mild marrow edema in the L4 and L5 pedicles consistent with secondary stress change.  New shallow left paracentral protrusion with cephalad extension at T12-L1 indents the thecal sac in the left lateral recess but no nerve  root compression is seen.  No change in moderate left foraminal narrowing at L5-S1 due to disc and facet degenerative disease.   Electronically Signed   By: Inge Rise M.D.   On: 04/16/2019 06:53    Lumbar MR wo contrast: No procedure found. Lumbar MR w/wo contrast: No results found for this or any previous visit. Lumbar MR w/wo contrast: No results found for this or any previous visit. Lumbar MR w contrast: No results found for this or any previous visit. Lumbar CT wo contrast: No results found for this or any previous visit. Lumbar CT w/wo contrast: No results found for this or any previous visit. Lumbar CT w/wo contrast: No results found for this or any previous visit. Lumbar CT w contrast: No results found for this or any previous visit. Lumbar DG 1V: No results found for this or any previous visit. Lumbar DG 1V (Clearing): No results found for this or any previous visit. Lumbar DG 2-3V (Clearing): No results found for this or any previous visit. Lumbar DG 2-3 views: No results found for this or any previous visit. Lumbar DG (Complete) 4+V: No results found for this or any previous visit.       Lumbar DG F/E views: No results found for this or any previous visit.       Lumbar DG Bending views: No results found for this or any previous visit.       Lumbar DG Myelogram views: No results found for this or any previous visit. Lumbar DG Myelogram: No results found for this or any previous visit. Lumbar DG Myelogram: No results found for this or any previous visit. Lumbar DG Myelogram: No results found for this or any previous visit. Lumbar DG Myelogram Lumbosacral: No results found for this or any previous visit. Lumbar DG Diskogram views: No results found for this or any previous visit. Lumbar DG Diskogram views: No results found for this or any previous visit. Lumbar DG Epidurogram OP: No results found for this or any previous visit. Lumbar DG Epidurogram IP: No results found  for this or any previous visit.  Sacroiliac Joint Imaging: Sacroiliac Joint DG: No results found for this or any previous visit. Sacroiliac Joint MR w/wo contrast: No results found for this or any previous visit. Sacroiliac Joint MR wo contrast: No results found for this or any previous visit.  Spine Imaging: Whole Spine DG Myelogram views:  No results found for this or any previous visit. Whole Spine MR Mets screen: No results found for this or any previous visit. Whole Spine MR Mets screen: No results found for this or any previous visit. Whole Spine MR w/wo: No results found for this or any previous visit. MRA Spinal Canal w/ cm: No results found for this or any previous visit. MRA Spinal Canal wo/ cm: No procedure found. MRA Spinal Canal w/wo cm: No results found for this or any previous visit. Spine Outside MR Films: No results found for this or any previous visit. Spine Outside CT Films: No results found for this or any previous visit. CT-Guided Biopsy: No results found for this or any previous visit. CT-Guided Needle Placement: No results found for this or any previous visit. DG Spine outside: No results found for this or any previous visit. IR Spine outside: No results found for this or any previous visit. NM Spine outside: No results found for this or any previous visit.  Hip Imaging: Hip-R MR w contrast: No results found for this or any previous visit. Hip-L MR w contrast: No results found for this or any previous visit. Hip-R MR w/wo contrast: No results found for this or any previous visit. Hip-L MR w/wo contrast: No results found for this or any previous visit. Hip-R MR wo contrast: No results found for this or any previous visit. Hip-L MR wo contrast: No results found for this or any previous visit. Hip-R CT w contrast: No results found for this or any previous visit. Hip-L CT w contrast: No results found for this or any previous visit. Hip-R CT w/wo contrast: No results  found for this or any previous visit. Hip-L CT w/wo contrast: No results found for this or any previous visit. Hip-R CT wo contrast: No results found for this or any previous visit. Hip-L CT wo contrast: No results found for this or any previous visit. Hip-R DG 2-3 views: No results found for this or any previous visit. Hip-L DG 2-3 views:  Results for orders placed during the hospital encounter of 09/22/19  DG HIP UNILAT W OR W/O PELVIS 2-3 VIEWS LEFT   Narrative CLINICAL DATA:  26 female with back pain and left hip pain.  EXAM: DG HIP (WITH OR WITHOUT PELVIS) 2-3V LEFT  COMPARISON:  CT abdomen pelvis dated 12/24/2017.  FINDINGS: There is no acute fracture or dislocation. Mild osteopenia. No significant arthritic changes. The soft tissues are unremarkable.  IMPRESSION: Negative.   Electronically Signed   By: Anner Crete M.D.   On: 09/22/2019 22:56    Hip-R DG Arthrogram: No results found for this or any previous visit. Hip-L DG Arthrogram: No results found for this or any previous visit. Hip-B DG Bilateral: No results found for this or any previous visit.  Knee Imaging: Knee-R MR w contrast: No results found for this or any previous visit. Knee-L MR w contrast: No results found for this or any previous visit. Knee-R MR w/wo contrast: No results found for this or any previous visit. Knee-L MR w/wo contrast: No results found for this or any previous visit. Knee-R MR wo contrast: No results found for this or any previous visit. Knee-L MR wo contrast: No results found for this or any previous visit. Knee-R CT w contrast: No results found for this or any previous visit. Knee-L CT w contrast: No results found for this or any previous visit. Knee-R CT w/wo contrast: No results found for this or any previous  visit. Knee-L CT w/wo contrast: No results found for this or any previous visit. Knee-R CT wo contrast: No results found for this or any previous visit. Knee-L  CT wo contrast: No results found for this or any previous visit. Knee-R DG 1-2 views: No results found for this or any previous visit. Knee-L DG 1-2 views: No results found for this or any previous visit. Knee-R DG 3 views: No results found for this or any previous visit. Knee-L DG 3 views: No results found for this or any previous visit. Knee-R DG 4 views: No results found for this or any previous visit. Knee-L DG 4 views:  Results for orders placed during the hospital encounter of 11/28/15  DG Knee Complete 4 Views Left   Narrative CLINICAL DATA:  Fall yesterday with persistent left knee pain, initial encounter  EXAM: LEFT KNEE - COMPLETE 4+ VIEW  COMPARISON:  None.  FINDINGS: No evidence of fracture, dislocation, or joint effusion. No evidence of arthropathy or other focal bone abnormality. Soft tissues are unremarkable.  IMPRESSION: No acute abnormality noted.   Electronically Signed   By: Inez Catalina M.D.   On: 11/28/2015 11:28    Knee-R DG Arthrogram: No results found for this or any previous visit. Knee-L DG Arthrogram: No results found for this or any previous visit.  Ankle Imaging: Ankle-R DG Complete: No results found for this or any previous visit. Ankle-L DG Complete: No results found for this or any previous visit.  Foot Imaging: Foot-R DG Complete:  Results for orders placed during the hospital encounter of 07/30/18  DG Foot Complete Right   Narrative CLINICAL DATA:  Right foot pain after injury last night.  EXAM: RIGHT FOOT COMPLETE - 3+ VIEW  COMPARISON:  Radiographs of December 10, 2016.  FINDINGS: There is no evidence of fracture or dislocation. Stable degenerative changes seen involving first metatarsophalangeal joint. Mild posterior calcaneal spurring is noted. Soft tissues are unremarkable.  IMPRESSION: No acute abnormality seen in the right foot.   Electronically Signed   By: Marijo Conception, M.D.   On: 07/30/2018 11:01    Foot-L DG  Complete: No results found for this or any previous visit.  Elbow Imaging: Elbow-R DG Complete: No results found for this or any previous visit. Elbow-L DG Complete: No results found for this or any previous visit.  Wrist Imaging: Wrist-R DG Complete: No results found for this or any previous visit. Wrist-L DG Complete: No results found for this or any previous visit.  Hand Imaging: Hand-R DG Complete: No results found for this or any previous visit. Hand-L DG Complete: No results found for this or any previous visit.  Complexity Note: Imaging results reviewed. Results shared with Samantha Macias, using Layman's terms.                         Meds   Current Outpatient Medications:  .  albuterol (PROVENTIL HFA;VENTOLIN HFA) 108 (90 Base) MCG/ACT inhaler, Inhale 2 puffs into the lungs every 6 (six) hours as needed for wheezing or shortness of breath., Disp: , Rfl:  .  buPROPion (WELLBUTRIN XL) 300 MG 24 hr tablet, Take 300 mg by mouth daily., Disp: , Rfl: 1 .  Caffeine-Magnesium Salicylate (DIUREX) 54-270.6 MG TABS, Take by mouth daily., Disp: , Rfl:  .  Cholecalciferol (VITAMIN D) 50 MCG (2000 UT) CAPS, Take by mouth daily., Disp: , Rfl:  .  diclofenac sodium (VOLTAREN) 1 % GEL, Apply 4 g  topically 4 (four) times daily as needed. (Patient taking differently: Apply 4 g topically 4 (four) times daily as needed. For pain), Disp: 100 g, Rfl: 5 .  diphenhydramine-acetaminophen (TYLENOL PM EXTRA STRENGTH) 25-500 MG TABS tablet, Take 2 tablets by mouth at bedtime as needed., Disp: , Rfl:  .  donepezil (ARICEPT) 10 MG tablet, Take 10 mg by mouth at bedtime., Disp: , Rfl:  .  doxepin (SINEQUAN) 25 MG capsule, Take 25 mg by mouth at bedtime., Disp: , Rfl:  .  HYDROcodone-acetaminophen (NORCO/VICODIN) 5-325 MG tablet, Take 1 tablet by mouth 3 (three) times daily as needed. , Disp: , Rfl:  .  losartan-hydrochlorothiazide (HYZAAR) 100-12.5 MG tablet, Take 1 tablet by mouth daily., Disp: 90 tablet, Rfl:  1 .  meloxicam (MOBIC) 15 MG tablet, Take 15 mg by mouth daily., Disp: , Rfl:  .  mirtazapine (REMERON) 15 MG tablet, Take 15 mg by mouth at bedtime., Disp: , Rfl: 5 .  Multiple Vitamins-Minerals (CENTRUM SILVER ADULT 50+ PO), Take 1 tablet by mouth daily., Disp: , Rfl:  .  pantoprazole (PROTONIX) 40 MG tablet, Take 40 mg by mouth daily., Disp: , Rfl: 1 .  pregabalin (LYRICA) 200 MG capsule, Take 1 capsule (200 mg total) by mouth 3 (three) times daily., Disp: 90 capsule, Rfl: 5 .  QUEtiapine (SEROQUEL) 100 MG tablet, Take 150 mg by mouth at bedtime., Disp: , Rfl: 0 .  ramelteon (ROZEREM) 8 MG tablet, Take 1 tablet (8 mg total) by mouth at bedtime., Disp: 90 tablet, Rfl: 1 .  sertraline (ZOLOFT) 50 MG tablet, Take 50 mg by mouth daily., Disp: , Rfl: 5 .  SPIRIVA HANDIHALER 18 MCG inhalation capsule, Place 1 puff into inhaler and inhale daily., Disp: , Rfl: 0 .  tiZANidine (ZANAFLEX) 4 MG capsule, Take 4 mg by mouth 3 (three) times daily as needed for muscle spasms., Disp: , Rfl:  .  umeclidinium-vilanterol (ANORO ELLIPTA) 62.5-25 MCG/INH AEPB, Inhale 1 puff into the lungs as needed., Disp: , Rfl:  .  predniSONE (DELTASONE) 20 MG tablet, Take 3 tablets (60 mg total) by mouth daily with breakfast for 3 days, THEN 2 tablets (40 mg total) daily with breakfast for 3 days, THEN 1 tablet (20 mg total) daily with breakfast for 3 days., Disp: 18 tablet, Rfl: 0 .  tiZANidine (ZANAFLEX) 2 MG tablet, Take 1 tablet (2 mg total) by mouth every 6 (six) hours as needed for muscle spasms. (Patient not taking: Reported on 10/27/2019), Disp: 15 tablet, Rfl: 5  ROS  Constitutional: Denies any fever or chills Gastrointestinal: No reported hemesis, hematochezia, vomiting, or acute GI distress Musculoskeletal: Denies any acute onset joint swelling, redness, loss of ROM, or weakness Neurological: No reported episodes of acute onset apraxia, aphasia, dysarthria, agnosia, amnesia, paralysis, loss of coordination, or loss of  consciousness  Allergies  Samantha Macias is allergic to contrast media [iodinated diagnostic agents]; shellfish allergy; tetanus toxoid adsorbed; ace inhibitors; codeine; nitrofurantoin monohyd macro; and red dye.  Samantha Macias  Drug: Samantha Macias  reports current drug use. Drug: Cocaine. Alcohol:  reports no history of alcohol use. Tobacco:  reports that she has been smoking cigarettes. She has a 6.25 pack-year smoking history. She has never used smokeless tobacco. Medical:  has a past medical history of Abnormal gait, Allergy, Anxiety, Arthritis, Back pain, Bipolar disorder (HCC), CAD (coronary artery disease), Chronic kidney disease, Collagen vascular disease ([place]), Colon polyp, COPD (chronic obstructive pulmonary disease) ([place]), Falls, Fibromyalgia, GERD (gastroesophageal reflux disease), Hyperlipidemia, Hypertension, IBS (  irritable bowel syndrome), Kidney disease, Migraine, Numbness and tingling, Renal insufficiency, Spinal stenosis, Static encephalopathy, Thyroid nodule (07/2017), and TIA (transient ischemic attack). Surgical: Samantha Macias  has a past surgical history that includes Tubal ligation; Nasal sinus surgery; Colonoscopy with propofol (N/A, 04/01/2017); Esophagogastroduodenoscopy (egd) with propofol (N/A, 04/01/2017); and Mouth surgery. Family: family history includes Alcohol abuse in her father; Anxiety disorder in her mother; Bipolar disorder in her mother; Congestive Heart Failure in her father; Depression in her mother; Heart attack in her father; Heart disease in her father and mother; Mental illness in her mother; Peripheral vascular disease in her mother; Thyroid disease in her mother.  Constitutional Exam  General appearance: Well nourished, well developed, and well hydrated. In no apparent acute distress Vitals:   10/27/19 1339  BP: (!) 128/93  Pulse: 98  Resp: 18  Temp: 97.9 F (36.6 C)  SpO2: 97%  Weight: 218 lb (98.9 kg)  Height: _0  (1.727 m)   BMI Assessment: Estimated body  mass index is 33.15 kg/m as calculated from the following:   Height as of this encounter: _1  (1.727 m).   Weight as of this encounter: 218 lb (98.9 kg).  BMI interpretation table: BMI level Category Range association with higher incidence of chronic pain  <18 kg/m2 Underweight   18.5-24.9 kg/m2 Ideal body weight   25-29.9 kg/m2 Overweight Increased incidence by 20%  30-34.9 kg/m2 Obese (Class I) Increased incidence by 68%  35-39.9 kg/m2 Severe obesity (Class II) Increased incidence by 136%  >40 kg/m2 Extreme obesity (Class III) Increased incidence by 254%   Patient's current BMI Ideal Body weight  Body mass index is 33.15 kg/m. Ideal body weight: 63.9 kg (140 lb 14 oz) Adjusted ideal body weight: 77.9 kg (171 lb 11.6 oz)   BMI Readings from Last 4 Encounters:  10/27/19 33.15 kg/m  06/30/19 29.80 kg/m  05/19/19 32.89 kg/m  04/14/19 34.83 kg/m   Wt Readings from Last 4 Encounters:  10/27/19 218 lb (98.9 kg)  06/30/19 196 lb (88.9 kg)  05/19/19 210 lb (95.3 kg)  04/14/19 215 lb 12.8 oz (97.9 kg)    Psych/Mental status: Alert, oriented x 3 (person, place, & time)       Eyes: PERLA Respiratory: No evidence of acute respiratory distress  Cervical Spine Exam  Skin & Axial Inspection: No masses, redness, edema, swelling, or associated skin lesions Alignment: Symmetrical Functional ROM: Unrestricted ROM      Stability: No instability detected Muscle Tone/Strength: Functionally intact. No obvious neuro-muscular anomalies detected. Sensory (Neurological): Unimpaired Palpation: No palpable anomalies              Upper Extremity (UE) Exam    Side: Right upper extremity  Side: Left upper extremity  Skin & Extremity Inspection: Skin color, temperature, and hair growth are WNL. No peripheral edema or cyanosis. No masses, redness, swelling, asymmetry, or associated skin lesions. No contractures.  Skin & Extremity Inspection: Skin color, temperature, and hair growth are WNL. No  peripheral edema or cyanosis. No masses, redness, swelling, asymmetry, or associated skin lesions. No contractures.  Functional ROM: Unrestricted ROM          Functional ROM: Unrestricted ROM          Muscle Tone/Strength: Functionally intact. No obvious neuro-muscular anomalies detected.  Muscle Tone/Strength: Functionally intact. No obvious neuro-muscular anomalies detected.  Sensory (Neurological): Unimpaired          Sensory (Neurological): Unimpaired          Palpation: No  palpable anomalies              Palpation: No palpable anomalies              Provocative Test(s):  Phalen's test: deferred Tinel's test: deferred Apley's scratch test (touch opposite shoulder):  Action 1 (Across chest): deferred Action 2 (Overhead): deferred Action 3 (LB reach): deferred   Provocative Test(s):  Phalen's test: deferred Tinel's test: deferred Apley's scratch test (touch opposite shoulder):  Action 1 (Across chest): deferred Action 2 (Overhead): deferred Action 3 (LB reach): deferred    Thoracic Spine Area Exam  Skin & Axial Inspection: No masses, redness, or swelling Alignment: Symmetrical Functional ROM: Unrestricted ROM Stability: No instability detected Muscle Tone/Strength: Functionally intact. No obvious neuro-muscular anomalies detected. Sensory (Neurological): Unimpaired Muscle strength & Tone: No palpable anomalies  Lumbar Exam  Skin & Axial Inspection: No masses, redness, or swelling Alignment: Symmetrical Functional ROM: Unrestricted ROM       Stability: No instability detected Muscle Tone/Strength: Functionally intact. No obvious neuro-muscular anomalies detected. Sensory (Neurological): Unimpaired Palpation: No palpable anomalies       Provocative Tests: Hyperextension/rotation test: deferred today       Lumbar quadrant test (Kemp's test): deferred today       Lateral bending test: deferred today       Patrick's Maneuver: deferred today                   FABER* test:  deferred today                   S-I anterior distraction/compression test: deferred today         S-I lateral compression test: deferred today         S-I Thigh-thrust test: deferred today         S-I Gaenslen's test: deferred today         *(Flexion, ABduction and External Rotation)  Gait & Posture Assessment  Ambulation: Patient came in today in a wheel chair.  I also noticed that she had a cane with her. Gait: Antalgic gait (limping) Posture: WNL   Lower Extremity Exam    Side: Right lower extremity  Side: Left lower extremity  Stability: No instability observed          Stability: No instability observed          Skin & Extremity Inspection: Skin color, temperature, and hair growth are WNL. No peripheral edema or cyanosis. No masses, redness, swelling, asymmetry, or associated skin lesions. No contractures.  Skin & Extremity Inspection: Mild to moderate changes in color towards redness, especially over the top of the foot in the area where she had her fracture.  She does have swelling compared to the right foot, and a temperature on the dorsal aspect of the right foot is 31.9 C while on the left is 33.8 C.  The patient also has some hyperalgesia to touch over the top of the foot but no significant changes in temperature that could be perceived on palpation.  Functional ROM: Unrestricted ROM                  Functional ROM: Decreased ROM                  Muscle Tone/Strength: Functionally intact. No obvious neuro-muscular anomalies detected.  Muscle Tone/Strength: Guarding.  The patient is able to wiggle all of her toes, but is restricting her motion secondary to the discomfort felt from the  fracture.  Sensory (Neurological): Unimpaired        Sensory (Neurological): Pain over the top of the foot, medial aspect and base of the Achilles tendon with little if no complaints in the area of the bottom of the foot.        DTR: Patellar: deferred today Achilles: deferred today Plantar:  deferred today  DTR: Patellar: deferred today Achilles: deferred today Plantar: deferred today  Palpation: No palpable anomalies  Palpation: Tender to palpation over the top of the foot with an area of hyper algesia close to where she saw for her fracture.   Assessment   Status Diagnosis  Healing Controlled Persistent 1. Closed fracture of left foot, sequela   2. Chronic pain syndrome   3. Complex regional pain syndrome type 1 of left lower extremity      Updated Problems: Problem  CRPS 1 (complex regional pain syndrome I) of lower limb (Left)    Plan of Care  Pharmacotherapy (Medications Ordered): Meds ordered this encounter  Medications  . predniSONE (DELTASONE) 20 MG tablet    Sig: Take 3 tablets (60 mg total) by mouth daily with breakfast for 3 days, THEN 2 tablets (40 mg total) daily with breakfast for 3 days, THEN 1 tablet (20 mg total) daily with breakfast for 3 days.    Dispense:  18 tablet    Refill:  0   Medications administered today: Samantha Macias had no medications administered during this visit.  Orders:  Orders Placed This Encounter  Procedures  . LUMBAR SYMPATHETIC BLOCK    For sympathetically-mediated lower extremity pain.    Standing Status:   Future    Standing Expiration Date:   11/26/2019    Scheduling Instructions:     Purpose: Diagnostic     Laterality: Left-sided     Level(s): Lumbar sympathetic chain (L3, L4)     Sedation: Sedation recommended.     Scheduling Timeframe: ASAP    Order Specific Question:   Where will this procedure be performed?    Answer:   [facility]   Lab Orders  No laboratory test(s) ordered today   Imaging Orders  No imaging studies ordered today   Referral Orders  No referral(s) requested today   Planned follow-up:   Return for Procedure (w/ sedation): (L) LSB #1, (ASAP).      Interventional treatment options:  Under consideration:   NOTE: (IODINE) SHELLFISH & CONTRAST ALLERGY!! (ANAPHYLACTIC)   Diagnostic midline LESI  Possible bilateral thoracic RFA  Diagnostic left IA knee injection    Therapeutic/palliative (PRN):   Diagnostic bilateral cervical facet block #2  Diagnostic bilateral Thoracic T7, T8, T9, & T10 Facet Medial Branch block #2 (80/100/100)  Diagnostic/therapeutic left CESI #2 (100/100/100)  Palliative bilateral lumbar facet block #3  Palliative left lumbar facet RFA #2 (last done 05/19/2019)  Palliative right lumbar facet RFA #1 (last done 06/30/2019)     Recent Visits Date Type Provider Dept  08/12/19 Telemedicine Milinda Pointer, [facility]  07/29/19 Telemedicine Milinda Pointer, MD [facility]  Showing recent visits within past 90 days and meeting all other requirements   Today's Visits Date Type Provider Dept  10/27/19 Office Visit Milinda Pointer, MD [facility]  Showing today's visits and meeting all other requirements   Future Appointments Date Type Provider Dept  11/03/19 Appointment Milinda Pointer, MD [facility]  Showing future appointments within next 90 days and meeting all other requirements   Primary Care Physician: Ginette Pitman,  Cherlyn Labella, MD Location: [facility] Note by: Gaspar Cola, MD Date: 10/27/2019; Time: 2:38 PM  Note: This dictation was prepared with Dragon dictation. Any transcriptional errors that may result from this process are unintentional.

## 2019-10-27 ENCOUNTER — Ambulatory Visit: Payer: Medicare Other | Attending: Pain Medicine | Admitting: Pain Medicine

## 2019-10-27 ENCOUNTER — Other Ambulatory Visit: Payer: Self-pay

## 2019-10-27 ENCOUNTER — Encounter: Payer: Self-pay | Admitting: Pain Medicine

## 2019-10-27 VITALS — BP 128/93 | HR 98 | Temp 97.9°F | Resp 18 | Ht 68.0 in | Wt 218.0 lb

## 2019-10-27 DIAGNOSIS — G894 Chronic pain syndrome: Secondary | ICD-10-CM

## 2019-10-27 DIAGNOSIS — S92902S Unspecified fracture of left foot, sequela: Secondary | ICD-10-CM | POA: Diagnosis present

## 2019-10-27 DIAGNOSIS — G90522 Complex regional pain syndrome I of left lower limb: Secondary | ICD-10-CM | POA: Diagnosis present

## 2019-10-27 MED ORDER — PREDNISONE 20 MG PO TABS: ORAL_TABLET | ORAL | 0 refills | Status: AC

## 2019-10-27 NOTE — Patient Instructions (Signed)
____________________________________________________________________________________________  Preparing for Procedure with Sedation  Procedure appointments are limited to planned procedures: . No Prescription Refills. . No disability issues will be discussed. . No medication changes will be discussed.  Instructions: . Oral Intake: Do not eat or drink anything for at least 8 hours prior to your procedure. (Exception: Blood Pressure Medication. See below.) . Transportation: Unless otherwise stated by your physician, you may drive yourself after the procedure. . Blood Pressure Medicine: Do not forget to take your blood pressure medicine with a sip of water the morning of the procedure. If your Diastolic (lower reading)is above 100 mmHg, elective cases will be cancelled/rescheduled. . Blood thinners: These will need to be stopped for procedures. Notify our staff if you are taking any blood thinners. Depending on which one you take, there will be specific instructions on how and when to stop it. . Diabetics on insulin: Notify the staff so that you can be scheduled 1st case in the morning. If your diabetes requires high dose insulin, take only  of your normal insulin dose the morning of the procedure and notify the staff that you have done so. . Preventing infections: Shower with an antibacterial soap the morning of your procedure. . Build-up your immune system: Take 1000 mg of Vitamin C with every meal (3 times a day) the day prior to your procedure. . Antibiotics: Inform the staff if you have a condition or reason that requires you to take antibiotics before dental procedures. . Pregnancy: If you are pregnant, call and cancel the procedure. . Sickness: If you have a cold, fever, or any active infections, call and cancel the procedure. . Arrival: You must be in the facility at least 30 minutes prior to your scheduled procedure. . Children: Do not bring children with you. . Dress appropriately:  Bring dark clothing that you would not mind if they get stained. . Valuables: Do not bring any jewelry or valuables.  Reasons to call and reschedule or cancel your procedure: (Following these recommendations will minimize the risk of a serious complication.) . Surgeries: Avoid having procedures within 2 weeks of any surgery. (Avoid for 2 weeks before or after any surgery). . Flu Shots: Avoid having procedures within 2 weeks of a flu shots or . (Avoid for 2 weeks before or after immunizations). . Barium: Avoid having a procedure within 7-10 days after having had a radiological study involving the use of radiological contrast. (Myelograms, Barium swallow or enema study). . Heart attacks: Avoid any elective procedures or surgeries for the initial 6 months after a "Myocardial Infarction" (Heart Attack). . Blood thinners: It is imperative that you stop these medications before procedures. Let us know if you if you take any blood thinner.  . Infection: Avoid procedures during or within two weeks of an infection (including chest colds or gastrointestinal problems). Symptoms associated with infections include: Localized redness, fever, chills, night sweats or profuse sweating, burning sensation when voiding, cough, congestion, stuffiness, runny nose, sore throat, diarrhea, nausea, vomiting, cold or Flu symptoms, recent or current infections. It is specially important if the infection is over the area that we intend to treat. . Heart and lung problems: Symptoms that may suggest an active cardiopulmonary problem include: cough, chest pain, breathing difficulties or shortness of breath, dizziness, ankle swelling, uncontrolled high or unusually low blood pressure, and/or palpitations. If you are experiencing any of these symptoms, cancel your procedure and contact your primary care physician for an evaluation.  Remember:  Regular Business hours are:    Monday to Thursday 8:00 AM to 4:00 PM  Provider's  Schedule: Chauntae Hults, MD:  Procedure days: Tuesday and Thursday 7:30 AM to 4:00 PM  Bilal Lateef, MD:  Procedure days: Monday and Wednesday 7:30 AM to 4:00 PM ____________________________________________________________________________________________    

## 2019-10-27 NOTE — Progress Notes (Signed)
Safety precautions to be maintained throughout the outpatient stay will include: orient to surroundings, keep bed in low position, maintain call bell within reach at all times, provide assistance with transfer out of bed and ambulation.  

## 2019-10-28 ENCOUNTER — Ambulatory Visit
Admission: RE | Admit: 2019-10-28 | Discharge: 2019-10-28 | Disposition: A | Discharge status: Home / Self Care | Payer: Medicare Other | Source: Ambulatory Visit | Attending: Internal Medicine | Admitting: Internal Medicine

## 2019-10-28 DIAGNOSIS — L819 Disorder of pigmentation, unspecified: Secondary | ICD-10-CM | POA: Diagnosis present

## 2019-10-28 DIAGNOSIS — J449 Chronic obstructive pulmonary disease, unspecified: Secondary | ICD-10-CM | POA: Diagnosis present

## 2019-10-28 DIAGNOSIS — F4310 Post-traumatic stress disorder, unspecified: Secondary | ICD-10-CM | POA: Insufficient documentation

## 2019-10-28 DIAGNOSIS — Z720 Tobacco use: Secondary | ICD-10-CM | POA: Insufficient documentation

## 2019-11-03 ENCOUNTER — Other Ambulatory Visit: Payer: Self-pay

## 2019-11-03 ENCOUNTER — Ambulatory Visit (HOSPITAL_BASED_OUTPATIENT_CLINIC_OR_DEPARTMENT_OTHER): Payer: Medicare Other | Admitting: Pain Medicine

## 2019-11-03 ENCOUNTER — Encounter: Payer: Self-pay | Admitting: Pain Medicine

## 2019-11-03 ENCOUNTER — Ambulatory Visit
Admission: RE | Admit: 2019-11-03 | Discharge: 2019-11-03 | Disposition: A | Discharge status: Home / Self Care | Payer: Medicare Other | Source: Ambulatory Visit | Attending: Pain Medicine | Admitting: Pain Medicine

## 2019-11-03 VITALS — BP 117/78 | HR 77 | Temp 96.6°F | Resp 17 | Ht 68.0 in | Wt 218.0 lb

## 2019-11-03 DIAGNOSIS — G8929 Other chronic pain: Secondary | ICD-10-CM | POA: Diagnosis present

## 2019-11-03 DIAGNOSIS — M5136 Other intervertebral disc degeneration, lumbar region: Secondary | ICD-10-CM | POA: Insufficient documentation

## 2019-11-03 DIAGNOSIS — M25552 Pain in left hip: Secondary | ICD-10-CM | POA: Diagnosis present

## 2019-11-03 DIAGNOSIS — Z91013 Allergy to seafood: Secondary | ICD-10-CM | POA: Diagnosis present

## 2019-11-03 DIAGNOSIS — Z91041 Radiographic dye allergy status: Secondary | ICD-10-CM | POA: Insufficient documentation

## 2019-11-03 DIAGNOSIS — Z87892 Personal history of anaphylaxis: Secondary | ICD-10-CM | POA: Diagnosis present

## 2019-11-03 DIAGNOSIS — G90522 Complex regional pain syndrome I of left lower limb: Secondary | ICD-10-CM | POA: Diagnosis present

## 2019-11-03 DIAGNOSIS — M79604 Pain in right leg: Secondary | ICD-10-CM | POA: Insufficient documentation

## 2019-11-03 DIAGNOSIS — M79605 Pain in left leg: Secondary | ICD-10-CM | POA: Insufficient documentation

## 2019-11-03 DIAGNOSIS — M25562 Pain in left knee: Secondary | ICD-10-CM | POA: Diagnosis present

## 2019-11-03 MED ORDER — FENTANYL CITRATE (PF) 100 MCG/2ML IJ SOLN
25.0000 ug | INTRAMUSCULAR | Status: DC | PRN
  Administered 2019-11-03: 100 ug via INTRAVENOUS
  Filled 2019-11-03: qty 2

## 2019-11-03 MED ORDER — DEXAMETHASONE SODIUM PHOSPHATE 10 MG/ML IJ SOLN
10.0000 mg | Freq: Once | INTRAMUSCULAR | Status: AC
  Administered 2019-11-03: 10 mg
  Filled 2019-11-03: qty 1

## 2019-11-03 MED ORDER — BUPIVACAINE-EPINEPHRINE (PF) 0.25% -1:200000 IJ SOLN
10.0000 mL | Freq: Once | INTRAMUSCULAR | Status: AC
  Administered 2019-11-03: 10 mL
  Filled 2019-11-03: qty 30

## 2019-11-03 MED ORDER — LIDOCAINE HCL 2 % IJ SOLN
20.0000 mL | Freq: Once | INTRAMUSCULAR | Status: AC
  Administered 2019-11-03: 400 mg
  Filled 2019-11-03: qty 40

## 2019-11-03 MED ORDER — MIDAZOLAM HCL 5 MG/5ML IJ SOLN
1.0000 mg | INTRAMUSCULAR | Status: DC | PRN
  Administered 2019-11-03: 3 mg via INTRAVENOUS
  Filled 2019-11-03: qty 5

## 2019-11-03 MED ORDER — LACTATED RINGERS IV SOLN
1000.0000 mL | Freq: Once | INTRAVENOUS | Status: AC
  Administered 2019-11-03: 1000 mL via INTRAVENOUS

## 2019-11-03 NOTE — Patient Instructions (Signed)

## 2019-11-03 NOTE — Progress Notes (Signed)
PROVIDER NOTE: Information contained herein reflects review and annotations entered in association with encounter. Interpretation of such information and data should be left to medically-trained personnel. Information provided to patient can be located elsewhere in the medical record under "Patient Instructions". Document created using STT-dictation technology, any transcriptional errors that may result from process are unintentional.    Patient: Samantha Macias  Service Category: Procedure  Provider: Gaspar Cola, MD  DOB: 06-09-1960  DOS: 11/03/2019  Location: [facility]  MRN: [id number]  Setting: Ambulatory - outpatient  Referring Provider: Tracie Harrier, MD  Type: Established Patient  Specialty: Interventional Pain Management  PCP: Tracie Harrier, MD   Primary Reason for Visit: Interventional Pain Management Treatment. CC: Back Pain  Procedure:          Anesthesia, Analgesia, Anxiolysis:  Type: Diagnostic Lumbar Sympathetic Block #1  Region:Lumbosacral Level: L3, L4 Laterality: Left-Sided Paravertebral  Type: Moderate (Conscious) Sedation combined with Local Anesthesia Indication(s): Analgesia and Anxiety Route: Intravenous (IV) IV Access: Secured Sedation: Meaningful verbal contact was maintained at all times during the procedure  Local Anesthetic: Lidocaine 1-2%  Position: Prone with head of the table was raised to facilitate breathing.   Indications: 1. Complex regional pain syndrome type 1 of left lower extremity   2. 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 Chronic knee pain (Left)   4. Chronic hip pain (Left)   5. DDD (degenerative disc disease), lumbar    History of anaphylaxis    History of allergy to radiographic contrast media    History of allergy to shellfish    Pain Score: Pre-procedure: 9 /10 Post-procedure: 4 /10   Note: Today the patient comes in for her first diagnostic lumbar sympathetic  block.  She indicates having more mottling on her left foot.  She still has some swelling associated to it compared to the right.  Initial temperature on the left foot was 91.5 F while on the right foot it was 87.2 F.  After the procedure, and before discharge, the temperature of the left foot was 89.4 F while that on the right foot was 83.2 F.  Pre-op Assessment:  Samantha Macias is a 59 y.o. (year old), female patient, seen today for interventional treatment. She  has a past surgical history that includes Tubal ligation; Nasal sinus surgery; Colonoscopy with propofol (N/A, 04/01/2017); Esophagogastroduodenoscopy (egd) with propofol (N/A, 04/01/2017); and Mouth surgery. Ms. Ton has a current medication list which includes the following prescription(s): albuterol, bupropion, diurex, vitamin d, diclofenac sodium, tylenol pm extra strength, donepezil, doxepin, hydrocodone-acetaminophen, losartan-hydrochlorothiazide, mirtazapine, multiple vitamins-minerals, pantoprazole, prednisone, pregabalin, quetiapine, ramelteon, sertraline, spiriva handihaler, tizanidine, and anoro ellipta, and the following Facility-Administered Medications: fentanyl and midazolam. Her primarily concern today is the Back Pain  Initial Vital Signs:  Pulse/HCG Rate: 77ECG Heart Rate: 82 Temp: (!) 97.5 F (36.4 C) Resp: 16 BP: 118/81 SpO2: 96 %  BMI: Estimated body mass index is 33.15 kg/m as calculated from the following:   Height as of this encounter: 5\' 8"  (1.727 m).   Weight as of this encounter: 218 lb (98.9 kg).  Risk Assessment: Allergies: Reviewed. She is allergic to contrast media [iodinated diagnostic agents]; shellfish allergy; tetanus toxoid adsorbed; ace inhibitors; codeine; nitrofurantoin monohyd macro; and red dye.  Allergy Precautions: None required Coagulopathies: Reviewed. None identified.  Blood-thinner therapy: None at this time Active Infection(s): Reviewed. None identified. Samantha Macias is  afebrile  Site Confirmation: Samantha Macias was asked to confirm the  procedure and laterality before marking the site Procedure checklist: Completed Consent: Before the procedure and under the influence of no sedative(s), amnesic(s), or anxiolytics, the patient was informed of the treatment options, risks and possible complications. To fulfill our ethical and legal obligations, as recommended by the American Medical Association's Code of Ethics, I have informed the patient of my clinical impression; the nature and purpose of the treatment or procedure; the risks, benefits, and possible complications of the intervention; the alternatives, including doing nothing; the risk(s) and benefit(s) of the alternative treatment(s) or procedure(s); and the risk(s) and benefit(s) of doing nothing. The patient was provided information about the general risks and possible complications associated with the procedure. These may include, but are not limited to: failure to achieve desired goals, infection, bleeding, organ or nerve damage, allergic reactions, paralysis, and death. In addition, the patient was informed of those risks and complications associated to Spine-related procedures, such as failure to decrease pain; infection (i.e.: Meningitis, epidural or intraspinal abscess); bleeding (i.e.: epidural hematoma, subarachnoid hemorrhage, or any other type of intraspinal or peri-dural bleeding); organ or nerve damage (i.e.: Any type of peripheral nerve, nerve root, or spinal cord injury) with subsequent damage to sensory, motor, and/or autonomic systems, resulting in permanent pain, numbness, and/or weakness of one or several areas of the body; allergic reactions; (i.e.: anaphylactic reaction); and/or death. Furthermore, the patient was informed of those risks and complications associated with the medications. These include, but are not limited to: allergic reactions (i.e.: anaphylactic or anaphylactoid reaction(s)); adrenal  axis suppression; blood sugar elevation that in diabetics may result in ketoacidosis or comma; water retention that in patients with history of congestive heart failure may result in shortness of breath, pulmonary edema, and decompensation with resultant heart failure; weight gain; swelling or edema; medication-induced neural toxicity; particulate matter embolism and blood vessel occlusion with resultant organ, and/or nervous system infarction; and/or aseptic necrosis of one or more joints. Finally, the patient was informed that Medicine is not an exact science; therefore, there is also the possibility of unforeseen or unpredictable risks and/or possible complications that may result in a catastrophic outcome. The patient indicated having understood very clearly. We have given the patient no guarantees and we have made no promises. Enough time was given to the patient to ask questions, all of which were answered to the patient's satisfaction. Ms. Sleeth has indicated that she wanted to continue with the procedure. Attestation: I, the ordering provider, attest that I have discussed with the patient the benefits, risks, side-effects, alternatives, likelihood of achieving goals, and potential problems during recovery for the procedure that I have provided informed consent. Date  Time: 11/03/2019  9:49 AM  Pre-Procedure Preparation:  Monitoring: As per clinic protocol. Respiration, ETCO2, SpO2, BP, heart rate and rhythm monitor placed and checked for adequate function Safety Precautions: Patient was assessed for positional comfort and pressure points before starting the procedure. Time-out: I initiated and conducted the "Time-out" before starting the procedure, as per protocol. The patient was asked to participate by confirming the accuracy of the "Time Out" information. Verification of the correct person, site, and procedure were performed and confirmed by me, the nursing staff, and the patient. "Time-out"  conducted as per Joint Commission's Universal Protocol (UP.01.01.01). Time: 1034  Description of Procedure:          Target Area: For Lumbar Sympathetic Block(s), the target is the anterolateral aspect of the L3 & L4 vertebral bodies, where the lumbar sympathetic chain resides. Approach: Paravertebral, ipsilateral  approach. Area Prepped: Entire Posterior Thoracolumbar Region DuraPrep (Iodine Povacrylex [0.7% available iodine] and Isopropyl Alcohol, 74% w/w) Safety Precautions: Aspiration looking for blood return was conducted prior to all injections. At no point did we inject any substances, as a needle was being advanced. No attempts were made at seeking any paresthesias. Safe injection practices and needle disposal techniques used. Medications properly checked for expiration dates. SDV (single dose vial) medications used. Description of the Procedure: Protocol guidelines were followed. The patient was placed in position over the procedure table. The target area was identified and the area prepped in the usual manner. Skin & deeper tissues infiltrated with local anesthetic. Appropriate amount of time allowed to pass for local anesthetics to take effect. The procedure needles were then advanced to the target area, the superior anterolateral border of the L3 vertebral body, under pulsed fluoroscopic guidance. Care was taken not to advance the tip of the needle past the anterior border of the vertebral body, on the lateral fluoroscopic view. Proper needle placement secured. Negative aspiration confirmed. Solution injected in intermittent fashion, asking for systemic symptoms every 0.5cc of injectate. The needles were then removed and the area cleansed, making sure to leave some of the prepping solution back to take advantage of its long term bactericidal properties. Vitals:   11/03/19 1045 11/03/19 1057 11/03/19 1107 11/03/19 1117  BP: 115/85 103/75 116/73 117/78  Pulse:      Resp: (!) 38 (!) 26 17 17    Temp:  (!) 97.1 F (36.2 C)  (!) 96.6 F (35.9 C)  TempSrc:  Temporal  Temporal  SpO2: 97% 98% 98% 99%  Weight:      Height:        Start Time: 1034 hrs. End Time: 1045 hrs. Materials:  Needle(s) Type: Spinal Needle Gauge: 22G Length: 7-in Medication(s): Please see orders for medications and dosing details.  Imaging Guidance (Spinal):          Type of Imaging Technique: Fluoroscopy Guidance (Spinal) Indication(s): Assistance in needle guidance and placement for procedures requiring needle placement in or near specific anatomical locations not easily accessible without such assistance. Exposure Time: Please see nurses notes. Contrast: None used. Fluoroscopic Guidance: I was personally present during the use of fluoroscopy. "Tunnel Vision Technique" used to obtain the best possible view of the target area. Parallax error corrected before commencing the procedure. "Direction-depth-direction" technique used to introduce the needle under continuous pulsed fluoroscopy. Once target was reached, antero-posterior, oblique, and lateral fluoroscopic projection used confirm needle placement in all planes. Images permanently stored in EMR. Interpretation: No contrast injected. I personally interpreted the imaging intraoperatively. Adequate needle placement confirmed in multiple planes. Permanent images saved into the patient's record.  Antibiotic Prophylaxis:   Anti-infectives (From admission, onward)   None     Indication(s): None identified  Post-operative Assessment:  Post-procedure Vital Signs:  Pulse/HCG Rate: 7778 Temp: (!) 96.6 F (35.9 C)(89.4 Left 83.2 right) Resp: 17 BP: 117/78 SpO2: 99 %  EBL: None  Complications: No immediate post-treatment complications observed by team, or reported by patient.  Note: The patient tolerated the entire procedure well. A repeat set of vitals were taken after the procedure and the patient was kept under observation following institutional  policy, for this type of procedure. Post-procedural neurological assessment was performed, showing return to baseline, prior to discharge. The patient was provided with post-procedure discharge instructions, including a section on how to identify potential problems. Should any problems arise concerning this procedure, the patient was given instructions  to immediately contact us, at any time, without hesitation. In any case, we plan to contact the patient by telephone for a follow-up status report regarding this interventional procedure.  Comments:  Upon the patient's discharge, she indicated that she was having quite a bit of discomfort in the left side around the PSIS area and she was also experiencing discomfort while attempting to flex the left leg at the level of the hip.  The patient was wondering if this was abnormal and I told the patient that although it is rare, it is still not abnormal and it is usually secondary to the psoas muscle being irritated from the needlestick.  I also reminded the patient that there was always the possibility of bleeding and a hematoma in the area, but since we did not get any heme during the procedure, it was not likely to be secondary to this.  I did use bupivacaine with epinephrine and the patient also does not display any type of tachycardia or hypotension after the test dose.  The patient also indicates not taking any kind of blood thinners and therefore I informed the patient that although possible, the possibilities of a hematoma in that area was not at the top of my list of possible problems.  The patient did not experience any type of paresthesia during the injection procedure.  She did experience some discomfort as the needle was being advanced, but nothing out of the ordinary considering that we are going towards the anterior and lateral aspect of the L3 and L4 vertebral bodies.  The patient also did not experience any pain going to the groin area that would make me  think that there was an intraneural injection.  I recommended that she put ice in the area, 15 minutes on, and 15 minutes off, for the first 24 hours and then heat from that point on.  I also reminded the patient that it is very likely that that psoas muscle will probably be sore for a while, meaning 5 to 7 days.  Plan of Care  Orders:  Orders Placed This Encounter  Procedures  . LUMBAR SYMPATHETIC BLOCK    For sympathetically-mediated lower extremity pain.    Scheduling Instructions:     Purpose: Diagnostic     Laterality: Left-sided     Level(s): Lumbar sympathetic chain (L3, L4)     Sedation: Sedation recommended.     Scheduling Timeframe: Today    Order Specific Question:   Where will this procedure be performed?    Answer:   [facility]  . DG PAIN CLIN[facility] C-ARM 1-60 MIN NO REPORT    Intraoperative interpretation by procedural physician at [place].    Standing Status:   Standing    Number of Occurrences:   1    Order Specific Question:   Reason for exam:    Answer:   Assistance in needle guidance and placement for procedures requiring needle placement in or near specific anatomical locations not easily accessible without such assistance.  . Informed Consent Details: Physician/Practitioner Attestation; Transcribe to consent form and obtain patient signature    Provider Attestation: I, Tunica Dossie Arbour, MD, (Pain Management Specialist), the physician/practitioner, attest that I have discussed with the patient the benefits, risks, side effects, alternatives, likelihood of achieving goals and potential problems during recovery for the procedure that I have provided informed consent.    Scheduling Instructions:     Procedure: Diagnostic left lumbar sympathetic block #1     Indication/Reason:  Lower extremity pain and swelling thought to be secondary to CRPS (complex regional pain syndrome)     Note: Always confirm laterality of pain with [name], before  procedure.  . Provide equipment / supplies at bedside    Equipment required: Single use, disposable, "Block Tray"    Standing Status:   Standing    Number of Occurrences:   1    Order Specific Question:   Specify    Answer:   Block Tray  . Care order/instruction:    After discharge provide the physician with the 1st and last temperatures for documentation purposes.    Scheduling Instructions:     Please attach temperature monitor to both upper/lower extremities to assess peripheral temperature changes during sympathetic blockade. Record these temperatures every 5-10 minutes, including during recovery phase.  . Miscellanous precautions    Standing Status:   Standing    Number of Occurrences:   1  . Miscellanous precautions    NOTE: Although It is true that patients can have allergies to shellfish and that shellfish contain iodine, most shellfish  allergies are due to two protein allergens present in the shellfish: tropomyosins and parvalbumin. Not all patients with shellfish allergies are allergic to iodine. However, as a precaution, avoid using iodine containing products.    Standing Status:   Standing    Number of Occurrences:   1   Chronic Opioid Analgesic:  None provided by our practice. NO OPIOIDS: UDS (08/07/17) (+) for Unreported Benzoylecgonine, a metabolite of cocaine; its presence    indicates use of this drug.  Source is most commonly illicit.   Medications ordered for procedure: Meds ordered this encounter  Medications  . lidocaine (XYLOCAINE) 2 % (with pres) injection 400 mg  . lactated ringers infusion 1,000 mL  . midazolam (VERSED) 5 MG/5ML injection 1-2 mg    Make sure Flumazenil is available in the pyxis when using this medication. If oversedation occurs, administer 0.2 mg IV over 15 sec. If after 45 sec no response, administer 0.2 mg again over 1 min; may repeat at 1 min intervals; not to exceed 4 doses (1 mg)  . fentaNYL (SUBLIMAZE) injection 25-50 mcg    Make sure  Narcan is available in the pyxis when using this medication. In the event of respiratory depression (RR< 8/min): Titrate NARCAN (naloxone) in increments of 0.1 to 0.2 mg IV at 2-3 minute intervals, until desired degree of reversal.  . dexamethasone (DECADRON) injection 10 mg  . bupivacaine-epinephrine (MARCAINE W/ EPI) 0.25% -1:200000 injection 10 mL   Medications administered: We administered lidocaine, lactated ringers, midazolam, fentaNYL, dexamethasone, and bupivacaine-epinephrine.  See the medical record for exact dosing, route, and time of administration.  Follow-up plan:   Return in about 2 weeks (around 11/17/2019) for (VV), (PP).       Interventional treatment options:  Under consideration:   NOTE: (IODINE) SHELLFISH & CONTRAST ALLERGY!! (ANAPHYLACTIC)  Diagnostic midline LESI  Possible bilateral thoracic RFA  Diagnostic left IA knee injection    Therapeutic/palliative (PRN):   Diagnostic bilateral cervical facet block #2  Diagnostic bilateral Thoracic T7, T8, T9, & T10 Facet Medial Branch block #2 (80/100/100)  Diagnostic/therapeutic left CESI #2 (100/100/100)  Palliative bilateral lumbar facet block #3  Palliative left lumbar facet RFA #2 (last done 05/19/2019)  Palliative right lumbar facet RFA #1 (last done 06/30/2019)      Recent Visits Date Type Provider Dept  10/27/19 Office Visit Milinda Pointer, [facility]  08/12/19 Telemedicine [place], [place],  MD [facility]  Showing recent visits within past 90 days and meeting all other requirements   Today's Visits Date Type Provider Dept  11/03/19 Procedure visit Milinda Pointer, MD [facility]  Showing today's visits and meeting all other requirements   Future Appointments Date Type Provider Dept  11/17/19 Appointment Milinda Pointer, MD [facility]  Showing future appointments within next 90 days and meeting all other requirements   Disposition: Discharge home   Discharge (Date  Time): 11/03/2019; 1132 hrs.   Primary Care Physician: Tracie Harrier, MD Location: [facility] Note by: Gaspar Cola, MD Date: 11/03/2019; Time: 4:11 PM  Disclaimer:  Medicine is not an Chief Strategy Officer. The only guarantee in medicine is that nothing is guaranteed. It is important to note that the decision to proceed with this intervention was based on the information collected from the patient. The Data and conclusions were drawn from the patient's questionnaire, the interview, and the physical examination. Because the information was provided in large part by the patient, it cannot be guaranteed that it has not been purposely or unconsciously manipulated. Every effort has been made to obtain as much relevant data as possible for this evaluation. It is important to note that the conclusions that lead to this procedure are derived in large part from the available data. Always take into account that the treatment will also be dependent on availability of resources and existing treatment guidelines, considered by other Pain Management Practitioners as being common knowledge and practice, at the time of the intervention. For Medico-Legal purposes, it is also important to point out that variation in procedural techniques and pharmacological choices are the acceptable norm. The indications, contraindications, technique, and results of the above procedure should only be interpreted and judged by a Board-Certified Interventional Pain Specialist with extensive familiarity and expertise in the same exact procedure and technique.

## 2019-11-04 ENCOUNTER — Telehealth: Payer: Self-pay

## 2019-11-04 NOTE — Telephone Encounter (Signed)
Patient states after about 8 hours she started experiencing pain again. Told her that she needed to give it at leat 3-10 for the steroid to start working. Patient with understanding.

## 2019-11-06 ENCOUNTER — Encounter: Payer: Self-pay | Admitting: Pain Medicine

## 2019-11-09 ENCOUNTER — Encounter: Payer: Self-pay | Admitting: Pain Medicine

## 2019-11-14 ENCOUNTER — Encounter (HOSPITAL_COMMUNITY): Payer: Self-pay | Admitting: Psychiatry

## 2019-11-14 ENCOUNTER — Telehealth (INDEPENDENT_AMBULATORY_CARE_PROVIDER_SITE_OTHER): Payer: Medicare Other | Admitting: Psychiatry

## 2019-11-14 ENCOUNTER — Other Ambulatory Visit: Payer: Self-pay

## 2019-11-14 DIAGNOSIS — F329 Major depressive disorder, single episode, unspecified: Secondary | ICD-10-CM

## 2019-11-14 DIAGNOSIS — F411 Generalized anxiety disorder: Secondary | ICD-10-CM | POA: Diagnosis not present

## 2019-11-14 MED ORDER — SERTRALINE HCL 50 MG PO TABS: 50.0000 mg | ORAL_TABLET | Freq: Every day | ORAL | 5 refills | Status: AC

## 2019-11-14 MED ORDER — BUPROPION HCL ER (XL) 300 MG PO TB24: 300.0000 mg | ORAL_TABLET | Freq: Every day | ORAL | 1 refills | Status: AC

## 2019-11-14 MED ORDER — TEMAZEPAM 30 MG PO CAPS
30.0000 mg | ORAL_CAPSULE | Freq: Every evening | ORAL | 2 refills | Status: DC | PRN
Start: 2019-11-14 — End: 2020-02-01
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
## 2019-11-16 ENCOUNTER — Encounter: Payer: Self-pay | Admitting: Pain Medicine

## 2019-11-16 NOTE — Progress Notes (Signed)
Patient: Samantha Macias  Service Category: E/M  Provider: Gaspar Cola, MD  DOB: 10-16-1960  DOS: 11/17/2019  Location: Office  MRN: [id number]  Setting: Ambulatory outpatient  Referring Provider: Tracie Harrier, MD  Type: Established Patient  Specialty: Interventional Pain Management  PCP: Tracie Harrier, MD  Location: Remote location  Delivery: TeleHealth     Virtual Encounter - Pain Management PROVIDER NOTE: Information contained herein reflects review and annotations entered in association with encounter. Interpretation of such information and data should be left to medically-trained personnel. Information provided to patient can be located elsewhere in the medical record under "Patient Instructions". Document created using STT-dictation technology, any transcriptional errors that may result from process are unintentional.    Contact & Pharmacy Preferred: [phone] Home: [phone] (home) Mobile: [phone] (mobile) E-mail: [email or web address]  [place], [place]. [place] [address] Phone: [phone] Fax: [phone]   Pre-screening  Samantha Macias offered "in-person" vs "virtual" encounter. She indicated preferring virtual for this encounter.   Reason COVID-19*  Social distancing based on CDC and AMA recommendations.   I contacted Samantha Macias on 11/17/2019 via telephone.      I clearly identified myself as Gaspar Cola, MD. I verified that I was speaking with the correct person using two identifiers (Name: Samantha Macias, and date of birth: 1961-05-22).  Consent I sought verbal advanced consent from Samantha Macias for virtual visit interactions. I informed Samantha Macias of possible security and privacy concerns, risks, and limitations associated with providing "not-in-person" medical evaluation and management services. I also informed Samantha Macias of the availability of "in-person" appointments.  Finally, I informed her that there would be a charge for the virtual visit and that she could be  personally, fully or partially, financially responsible for it. Samantha Macias expressed understanding and agreed to proceed.   Historic Elements   Samantha Macias is a 59 y.o. year old, female patient evaluated today after her last contact with our practice on 11/04/2019. Samantha Macias  has a past medical history of Abnormal gait, Allergy, Anxiety, Arthritis, Back pain, Bipolar disorder ([place]), CAD (coronary artery disease), Chronic kidney disease, Collagen vascular disease ([place]), Colon polyp, COPD (chronic obstructive pulmonary disease) ([place]), [place], Fibromyalgia, GERD (gastroesophageal reflux disease), Hyperlipidemia, Hypertension, IBS (irritable bowel syndrome), Kidney disease, Migraine, Numbness and tingling, Renal insufficiency, Spinal stenosis, Static encephalopathy, Thyroid nodule (07/2017), and TIA (transient ischemic attack). She also  has a past surgical history that includes Tubal ligation; Nasal sinus surgery; Colonoscopy with propofol (N/A, 04/01/2017); Esophagogastroduodenoscopy (egd) with propofol (N/A, 04/01/2017); and Mouth surgery. Samantha Macias has a current medication list which includes the following prescription(s): albuterol, bupropion, diurex, vitamin d, diclofenac sodium, tylenol pm extra strength, donepezil, hydrocodone-acetaminophen, hydrocodone-acetaminophen, losartan-hydrochlorothiazide, meloxicam, multiple vitamins-minerals, pantoprazole, pregabalin, sertraline, spiriva handihaler, temazepam, tizanidine, and anoro ellipta. She  reports that she has been smoking cigarettes. She has a 6.25 pack-year smoking history. She has never used smokeless tobacco. She reports current drug use. Drug: Cocaine. No history on file for alcohol use. Samantha Macias is allergic to contrast media [iodinated diagnostic agents], shellfish allergy, tetanus toxoid adsorbed, ace inhibitors, codeine, nitrofurantoin monohyd  macro, and red dye.   HPI  Today, she is being contacted for a post-procedure assessment.  According to the patient the day of the procedure she was experiencing absolutely no pain.  Afterwards, the pain slowly started coming back until 2 days after the procedure when she got up in  the middle of the night to go to the bathroom and as she was coming back to bed she ended up striking a fire safe with that same left foot.  Even though she initially said that she had broken her toes, she refers that she went to have them checked and x-rays showed no fractures but they are still black and blue and very swollen.  She also indicates that her pain is not quite as bad as it was before the procedure, but it is slowly getting there and she would like to have the procedure repeated again to give it a fair chance and to determine if it was really going to help.  Hopefully this time she will not end up having another trauma to the same leg.  Post-Procedure Evaluation  Procedure: Diagnostic left-sided L3, L4 lumbar sympathetic block #1 under fluoroscopic guidance and IV sedation Pre-procedure pain level: 9/10 Post-procedure: 4/10 (> 50% relief)  Sedation: Sedation provided.  Effectiveness during initial hour after procedure(Ultra-Short Term Relief): 100 %.  Local anesthetic used: Long-acting (4-6 hours) Effectiveness: Defined as any analgesic benefit obtained secondary to the administration of local anesthetics. This carries significant diagnostic value as to the etiological location, or anatomical origin, of the pain. Duration of benefit is expected to coincide with the duration of the local anesthetic used.  Effectiveness during initial 4-6 hours after procedure(Short-Term Relief): 100 %.  Long-term benefit: Defined as any relief past the pharmacologic duration of the local anesthetics.  Effectiveness past the initial 6 hours after procedure(Long-Term Relief): 0 % (patient broke 3rd and 4th toe on left foot 2  days after procedure and is unable to determine whether procedure helped.).  Current benefits: Defined as benefit that persist at this time.   Analgesia:  Unknown secondary to fractures of 2 toes on the affected left foot, 2 days after the diagnostic procedure Function: Back to baseline ROM: Back to baseline  Pharmacotherapy Assessment  Analgesic: None provided by our practice. NO OPIOIDS: UDS (08/07/17) (+) for Unreported Benzoylecgonine, a metabolite of cocaine; its presence    indicates use of this drug.  Source is most commonly illicit.   Monitoring: [employer] PMP: PDMP reviewed during this encounter.       Pharmacotherapy: No side-effects or adverse reactions reported. Compliance: No problems identified. Effectiveness: Clinically acceptable. Plan: Refer to "POC".  UDS:  Summary  Date Value Ref Range Status  03/17/2018 FINAL  Final    Comment:    ==================================================================== [facility]RE COMP DRUG ANALYSIS,UR ==================================================================== Test                             Result       Flag       Units Drug Present and Declared for Prescription Verification   Tramadol                       1825         EXPECTED   ng/mg creat   O-Desmethyltramadol            929          EXPECTED   ng/mg creat   N-Desmethyltramadol            1352         EXPECTED   ng/mg creat    Source of tramadol is a prescription medication.    O-desmethyltramadol and N-desmethyltramadol are expected    metabolites of tramadol.  Pregabalin                     PRESENT      EXPECTED   Tizanidine                     PRESENT      EXPECTED   Bupropion                      PRESENT      EXPECTED   Hydroxybupropion               PRESENT      EXPECTED    Hydroxybupropion is an expected metabolite of bupropion.   Mirtazapine                    PRESENT      EXPECTED   Quetiapine                     PRESENT      EXPECTED   Salicylate                      PRESENT      EXPECTED Drug Present not Declared for Prescription Verification   Benzoylecgonine                494          UNEXPECTED ng/mg creat    Benzoylecgonine is a metabolite of cocaine; its presence    indicates use of this drug.  Source is most commonly illicit, but    cocaine is present in some topical anesthetic solutions.   Acetaminophen                  PRESENT      UNEXPECTED   Diphenhydramine                PRESENT      UNEXPECTED Drug Absent but Declared for Prescription Verification   Alprazolam                     Not Detected UNEXPECTED ng/mg creat   Sertraline                     Not Detected UNEXPECTED   Diclofenac                     Not Detected UNEXPECTED    Topical diclofenac, as indicated in the declared medication list,    is not always detected even when used as directed.   Ibuprofen                      Not Detected UNEXPECTED    Ibuprofen, as indicated in the declared medication list, is not    always detected even when used as directed. ==================================================================== Test                      Result    Flag   Units      Ref Range   Creatinine              159              mg/dL      >=20 ==================================================================== Declared Medications:  The flagging and interpretation on this report are based on the  following declared medications.  Unexpected results may arise  from  inaccuracies in the declared medications.  **Note: The testing scope of this panel includes these medications:  Alprazolam (Xanax)  Bupropion (Wellbutrin)  Mirtazapine (Remeron)  Pregabalin (Lyrica)  Quetiapine (Seroquel)  Sertraline (Zoloft)  Tramadol (Ultram)  **Note: The testing scope of this panel does not include small to  moderate amounts of these reported medications:  Aspirin (Aspirin 81)  Ibuprofen  Tizanidine (Zanaflex)  Topical Diclofenac  **Note: The testing scope of this panel does not  include following  reported medications:  Albuterol  Donepezil (Aricept)  Formoterol (Perforomist)  Hydrochlorothiazide (Hyzaar)  Ipratropium  Losartan (Hyzaar)  Omeprazole (Prilosec)  Pantoprazole (Protonix)  Ramelteon (Rozerem)  Tiotropium (Spiriva)  Vitamin D ==================================================================== For clinical consultation, please call [phone]. ====================================================================     Laboratory Chemistry Profile   Renal Lab Results  Component Value Date   BUN 22 03/17/2018   CREATININE 1.35 (H) 03/17/2018   BCR 16 03/17/2018   GFRAA 51 (L) 03/17/2018   GFRNONAA 44 (L) 03/17/2018     Hepatic Lab Results  Component Value Date   AST 13 03/17/2018   ALT 15 12/24/2017   ALBUMIN 4.4 03/17/2018   ALKPHOS 148 (H) 03/17/2018   LIPASE 37 12/24/2017   AMMONIA 23 07/07/2015     Electrolytes Lab Results  Component Value Date   NA 141 03/17/2018   K 4.3 03/17/2018   CL 99 03/17/2018   CALCIUM 9.1 03/17/2018   MG 1.8 03/17/2018     Bone Lab Results  Component Value Date   25OHVITD1 41 03/17/2018   25OHVITD2 <1.0 03/17/2018   25OHVITD3 41 03/17/2018     Inflammation (CRP: Acute Phase) (ESR: Chronic Phase) Lab Results  Component Value Date   CRP 17 (H) 03/17/2018   ESRSEDRATE 28 03/17/2018       Note: Above Lab results reviewed.   Imaging  DG PAIN CLINIC C-ARM 1-60 MIN NO REPORT Fluoro was used, but no Radiologist interpretation will be provided.  Please refer to "NOTES" tab for provider progress note.  Assessment  The primary encounter diagnosis was Chronic pain syndrome. Diagnoses of Complex regional pain syndrome type 1 of left lower extremity and Chronic lower extremity pain (Secondary Area of Pain) (Bilateral) (L>R) were also pertinent to this visit.  Plan of Care  Problem-specific:  No problem-specific Assessment & Plan notes found for this encounter.  Samantha Macias  has a current medication list which includes the following long-term medication(s): albuterol, bupropion, tylenol pm extra strength, donepezil, losartan-hydrochlorothiazide, pantoprazole, pregabalin, sertraline, spiriva handihaler, and temazepam.  Pharmacotherapy (Medications Ordered): No orders of the defined types were placed in this encounter.  Orders:  Orders Placed This Encounter  Procedures  . LUMBAR SYMPATHETIC BLOCK    For sympathetically-mediated lower extremity pain.    Standing Status:   Future    Standing Expiration Date:   12/17/2019    Scheduling Instructions:     Purpose: Diagnostic     Laterality: Left-sided     Level(s): Lumbar sympathetic chain (L3, L4)     Sedation: Sedation recommended.     Scheduling Timeframe: ASAA    Order Specific Question:   Where will this procedure be performed?    Answer:   [facility]   Follow-up plan:   Return for Procedure (w/ sedation): (L) LSB #2.      Interventional treatment options:  Under consideration:   NOTE: (IODINE) SHELLFISH & CONTRAST ALLERGY!! (ANAPHYLACTIC)  Diagnostic midline LESI  Possible bilateral thoracic RFA  Diagnostic left IA  knee injection    Therapeutic/palliative (PRN):   Diagnostic bilateral cervical facet block #2  Diagnostic bilateral Thoracic T7, T8, T9, & T10 Facet Medial Branch block #2 (80/100/100)  Diagnostic/therapeutic left CESI #2 (100/100/100)  Palliative bilateral lumbar facet block #3  Palliative left lumbar facet RFA #2 (last done 05/19/2019)  Palliative right lumbar facet RFA #1 (last done 06/30/2019)       Recent Visits Date Type Provider Dept  11/03/19 Procedure visit Milinda Pointer, MD [facility]  10/27/19 Office Visit Milinda Pointer, MD [facility]  Showing recent visits within past 90 days and meeting all other requirements Today's Visits Date Type Provider Dept  11/17/19 Telemedicine Milinda Pointer, MD [facility]  Showing  today's visits and meeting all other requirements Future Appointments No visits were found meeting these conditions. Showing future appointments within next 90 days and meeting all other requirements  I discussed the assessment and treatment plan with the patient. The patient was provided an opportunity to ask questions and all were answered. The patient agreed with the plan and demonstrated an understanding of the instructions.  Patient advised to call back or seek an in-person evaluation if the symptoms or condition worsens.  Duration of encounter: 15 minutes.  Note by: Gaspar Cola, MD Date: 11/17/2019; Time: 3:50 PM

## 2019-11-17 ENCOUNTER — Other Ambulatory Visit: Payer: Self-pay

## 2019-11-17 ENCOUNTER — Ambulatory Visit: Payer: Medicare Other | Attending: Pain Medicine | Admitting: Pain Medicine

## 2019-11-17 DIAGNOSIS — G90522 Complex regional pain syndrome I of left lower limb: Secondary | ICD-10-CM | POA: Diagnosis not present

## 2019-11-17 DIAGNOSIS — M79604 Pain in right leg: Secondary | ICD-10-CM

## 2019-11-17 DIAGNOSIS — M79605 Pain in left leg: Secondary | ICD-10-CM

## 2019-11-17 DIAGNOSIS — G894 Chronic pain syndrome: Secondary | ICD-10-CM

## 2019-11-17 DIAGNOSIS — G8929 Other chronic pain: Secondary | ICD-10-CM

## 2019-11-17 NOTE — Patient Instructions (Signed)
____________________________________________________________________________________________  Preparing for Procedure with Sedation  Procedure appointments are limited to planned procedures: . No Prescription Refills. . No disability issues will be discussed. . No medication changes will be discussed.  Instructions: . Oral Intake: Do not eat or drink anything for at least 8 hours prior to your procedure. (Exception: Blood Pressure Medication. See below.) . Transportation: Unless otherwise stated by your physician, you may drive yourself after the procedure. . Blood Pressure Medicine: Do not forget to take your blood pressure medicine with a sip of water the morning of the procedure. If your Diastolic (lower reading)is above 100 mmHg, elective cases will be cancelled/rescheduled. . Blood thinners: These will need to be stopped for procedures. Notify our staff if you are taking any blood thinners. Depending on which one you take, there will be specific instructions on how and when to stop it. . Diabetics on insulin: Notify the staff so that you can be scheduled 1st case in the morning. If your diabetes requires high dose insulin, take only  of your normal insulin dose the morning of the procedure and notify the staff that you have done so. . Preventing infections: Shower with an antibacterial soap the morning of your procedure. . Build-up your immune system: Take 1000 mg of Vitamin C with every meal (3 times a day) the day prior to your procedure. . Antibiotics: Inform the staff if you have a condition or reason that requires you to take antibiotics before dental procedures. . Pregnancy: If you are pregnant, call and cancel the procedure. . Sickness: If you have a cold, fever, or any active infections, call and cancel the procedure. . Arrival: You must be in the facility at least 30 minutes prior to your scheduled procedure. . Children: Do not bring children with you. . Dress appropriately:  Bring dark clothing that you would not mind if they get stained. . Valuables: Do not bring any jewelry or valuables.  Reasons to call and reschedule or cancel your procedure: (Following these recommendations will minimize the risk of a serious complication.) . Surgeries: Avoid having procedures within 2 weeks of any surgery. (Avoid for 2 weeks before or after any surgery). . Flu Shots: Avoid having procedures within 2 weeks of a flu shots or . (Avoid for 2 weeks before or after immunizations). . Barium: Avoid having a procedure within 7-10 days after having had a radiological study involving the use of radiological contrast. (Myelograms, Barium swallow or enema study). . Heart attacks: Avoid any elective procedures or surgeries for the initial 6 months after a "Myocardial Infarction" (Heart Attack). . Blood thinners: It is imperative that you stop these medications before procedures. Let us know if you if you take any blood thinner.  . Infection: Avoid procedures during or within two weeks of an infection (including chest colds or gastrointestinal problems). Symptoms associated with infections include: Localized redness, fever, chills, night sweats or profuse sweating, burning sensation when voiding, cough, congestion, stuffiness, runny nose, sore throat, diarrhea, nausea, vomiting, cold or Flu symptoms, recent or current infections. It is specially important if the infection is over the area that we intend to treat. . Heart and lung problems: Symptoms that may suggest an active cardiopulmonary problem include: cough, chest pain, breathing difficulties or shortness of breath, dizziness, ankle swelling, uncontrolled high or unusually low blood pressure, and/or palpitations. If you are experiencing any of these symptoms, cancel your procedure and contact your primary care physician for an evaluation.  Remember:  Regular Business hours are:    Monday to Thursday 8:00 AM to 4:00 PM  Provider's  Schedule: Shahan Starks, MD:  Procedure days: Tuesday and Thursday 7:30 AM to 4:00 PM  Bilal Lateef, MD:  Procedure days: Monday and Wednesday 7:30 AM to 4:00 PM ____________________________________________________________________________________________    

## 2019-11-24 ENCOUNTER — Ambulatory Visit: Payer: Medicare Other | Admitting: Pain Medicine

## 2019-11-26 ENCOUNTER — Ambulatory Visit (HOSPITAL_BASED_OUTPATIENT_CLINIC_OR_DEPARTMENT_OTHER): Payer: Medicare Other | Admitting: Pain Medicine

## 2019-11-26 ENCOUNTER — Ambulatory Visit
Admission: RE | Admit: 2019-11-26 | Discharge: 2019-11-26 | Disposition: A | Discharge status: Home / Self Care | Payer: Medicare Other | Source: Ambulatory Visit | Attending: Pain Medicine | Admitting: Pain Medicine

## 2019-11-26 ENCOUNTER — Encounter: Payer: Self-pay | Admitting: Pain Medicine

## 2019-11-26 ENCOUNTER — Other Ambulatory Visit: Payer: Self-pay

## 2019-11-26 VITALS — BP 111/79 | HR 86 | Temp 97.1°F | Resp 20 | Ht 66.0 in | Wt 220.0 lb

## 2019-11-26 DIAGNOSIS — Z91013 Allergy to seafood: Secondary | ICD-10-CM | POA: Diagnosis present

## 2019-11-26 DIAGNOSIS — G90522 Complex regional pain syndrome I of left lower limb: Secondary | ICD-10-CM | POA: Insufficient documentation

## 2019-11-26 DIAGNOSIS — Z87892 Personal history of anaphylaxis: Secondary | ICD-10-CM | POA: Diagnosis present

## 2019-11-26 DIAGNOSIS — M79605 Pain in left leg: Secondary | ICD-10-CM

## 2019-11-26 DIAGNOSIS — G8929 Other chronic pain: Secondary | ICD-10-CM

## 2019-11-26 DIAGNOSIS — M79604 Pain in right leg: Secondary | ICD-10-CM

## 2019-11-26 DIAGNOSIS — Z91041 Radiographic dye allergy status: Secondary | ICD-10-CM

## 2019-11-26 MED ORDER — FENTANYL CITRATE (PF) 100 MCG/2ML IJ SOLN
25.0000 ug | INTRAMUSCULAR | Status: DC | PRN
  Administered 2019-11-26: 50 ug via INTRAVENOUS

## 2019-11-26 MED ORDER — BUPIVACAINE-EPINEPHRINE (PF) 0.25% -1:200000 IJ SOLN
10.0000 mL | Freq: Once | INTRAMUSCULAR | Status: AC
  Administered 2019-11-26: 7 mL

## 2019-11-26 MED ORDER — DEXAMETHASONE SODIUM PHOSPHATE 10 MG/ML IJ SOLN
10.0000 mg | Freq: Once | INTRAMUSCULAR | Status: AC
  Administered 2019-11-26: 10 mg

## 2019-11-26 MED ORDER — BUPIVACAINE-EPINEPHRINE (PF) 0.25% -1:200000 IJ SOLN
INTRAMUSCULAR | Status: AC
  Filled 2019-11-26: qty 30

## 2019-11-26 MED ORDER — MIDAZOLAM HCL 5 MG/5ML IJ SOLN
1.0000 mg | INTRAMUSCULAR | Status: DC | PRN
  Administered 2019-11-26 (×2): 1 mg via INTRAVENOUS
  Administered 2019-11-26: 2 mg via INTRAVENOUS

## 2019-11-26 MED ORDER — FENTANYL CITRATE (PF) 100 MCG/2ML IJ SOLN
INTRAMUSCULAR | Status: AC
  Filled 2019-11-26: qty 2

## 2019-11-26 MED ORDER — LACTATED RINGERS IV SOLN
1000.0000 mL | Freq: Once | INTRAVENOUS | Status: AC
  Administered 2019-11-26: 1000 mL via INTRAVENOUS

## 2019-11-26 MED ORDER — MIDAZOLAM HCL 5 MG/5ML IJ SOLN
INTRAMUSCULAR | Status: AC
  Filled 2019-11-26: qty 5

## 2019-11-26 MED ORDER — LIDOCAINE HCL 2 % IJ SOLN
20.0000 mL | Freq: Once | INTRAMUSCULAR | Status: AC
  Administered 2019-11-26: 400 mg

## 2019-11-26 MED ORDER — DEXAMETHASONE SODIUM PHOSPHATE 10 MG/ML IJ SOLN
INTRAMUSCULAR | Status: AC
  Filled 2019-11-26: qty 1

## 2019-11-26 NOTE — Patient Instructions (Signed)

## 2019-11-26 NOTE — Progress Notes (Signed)
PROVIDER NOTE: Information contained herein reflects review and annotations entered in association with encounter. Interpretation of such information and data should be left to medically-trained personnel. Information provided to patient can be located elsewhere in the medical record under "Patient Instructions". Document created using STT-dictation technology, any transcriptional errors that may result from process are unintentional.    Patient: Samantha Macias  Service Category: Procedure  Provider: Gaspar Cola, MD  DOB: [birth year]  DOS: 11/26/2019  Location: [facility]  MRN: [id number]  Setting: Ambulatory - outpatient  Referring Provider: Tracie Harrier, MD  Type: Established Patient  Specialty: Interventional Pain Management  PCP: Tracie Harrier, MD   Primary Reason for Visit: Interventional Pain Management Treatment. CC: Back Pain  Procedure:          Anesthesia, Analgesia, Anxiolysis:  Type: Diagnostic Lumbar Sympathetic Block #2  Region:Lumbosacral Level: L3, L4 Laterality: Left Paravertebral  Type: Moderate (Conscious) Sedation combined with Local Anesthesia Indication(s): Analgesia and Anxiety Route: Intravenous (IV) IV Access: Secured Sedation: Meaningful verbal contact was maintained at all times during the procedure  Local Anesthetic: Lidocaine 1-2%  Position: Prone with head of the table was raised to facilitate breathing.   Indications: 1. Chronic lower extremity pain (Secondary Area of Pain) (Bilateral) (L>R)   2. Complex regional pain syndrome type 1 of left lower extremity    History of allergy to radiographic contrast media    History of allergy to shellfish    History of anaphylaxis    Pain Score: Pre-procedure: 6 /10 Post-procedure: 0-No pain/10   Initial temperature: 87.4 left and 81.5 right  Final temperature: 90.1 left and 80.9 right   Pre-op Assessment:  Samantha Macias is a 59 y.o. (year old), female patient, seen today for  interventional treatment. She  has a past surgical history that includes Tubal ligation; Nasal sinus surgery; Colonoscopy with propofol (N/A, 04/01/2017); Esophagogastroduodenoscopy (egd) with propofol (N/A, 04/01/2017); and Mouth surgery. Samantha Macias has a current medication list which includes the following prescription(s): albuterol, bupropion, diurex, vitamin d, diclofenac sodium, tylenol pm extra strength, donepezil, hydrocodone-acetaminophen, losartan-hydrochlorothiazide, meloxicam, multiple vitamins-minerals, pantoprazole, pregabalin, sertraline, spiriva handihaler, temazepam, tizanidine, and anoro ellipta, and the following Facility-Administered Medications: fentanyl and midazolam. Her primarily concern today is the Back Pain  Initial Vital Signs:  Pulse/HCG Rate: 85ECG Heart Rate: 82 Temp: (!) 97 F (36.1 C) Resp: 20 BP: (!) 123/91 SpO2: 97 %  BMI: Estimated body mass index is 35.51 kg/m as calculated from the following:   Height as of this encounter: 5\' 6"  (1.676 m).   Weight as of this encounter: 220 lb (99.8 kg).  Risk Assessment: Allergies: Reviewed. She is allergic to contrast media [iodinated diagnostic agents], shellfish allergy, tetanus toxoid adsorbed, ace inhibitors, codeine, nitrofurantoin monohyd macro, and red dye.  Allergy Precautions: None required Coagulopathies: Reviewed. None identified.  Blood-thinner therapy: None at this time Active Infection(s): Reviewed. None identified. Samantha Macias is afebrile  Site Confirmation: Samantha Macias was asked to confirm the procedure and laterality before marking the site Procedure checklist: Completed Consent: Before the procedure and under the influence of no sedative(s), amnesic(s), or anxiolytics, the patient was informed of the treatment options, risks and possible complications. To fulfill our ethical and legal obligations, as recommended by the American Medical Association's Code of Ethics, I have informed the patient of my  clinical impression; the nature and purpose of the treatment or procedure; the risks, benefits, and possible complications of the intervention; the alternatives, including doing nothing; the risk(s) and  benefit(s) of the alternative treatment(s) or procedure(s); and the risk(s) and benefit(s) of doing nothing. The patient was provided information about the general risks and possible complications associated with the procedure. These may include, but are not limited to: failure to achieve desired goals, infection, bleeding, organ or nerve damage, allergic reactions, paralysis, and death. In addition, the patient was informed of those risks and complications associated to Spine-related procedures, such as failure to decrease pain; infection (i.e.: Meningitis, epidural or intraspinal abscess); bleeding (i.e.: epidural hematoma, subarachnoid hemorrhage, or any other type of intraspinal or peri-dural bleeding); organ or nerve damage (i.e.: Any type of peripheral nerve, nerve root, or spinal cord injury) with subsequent damage to sensory, motor, and/or autonomic systems, resulting in permanent pain, numbness, and/or weakness of one or several areas of the body; allergic reactions; (i.e.: anaphylactic reaction); and/or death. Furthermore, the patient was informed of those risks and complications associated with the medications. These include, but are not limited to: allergic reactions (i.e.: anaphylactic or anaphylactoid reaction(s)); adrenal axis suppression; blood sugar elevation that in diabetics may result in ketoacidosis or comma; water retention that in patients with history of congestive heart failure may result in shortness of breath, pulmonary edema, and decompensation with resultant heart failure; weight gain; swelling or edema; medication-induced neural toxicity; particulate matter embolism and blood vessel occlusion with resultant organ, and/or nervous system infarction; and/or aseptic necrosis of one or  more joints. Finally, the patient was informed that Medicine is not an exact science; therefore, there is also the possibility of unforeseen or unpredictable risks and/or possible complications that may result in a catastrophic outcome. The patient indicated having understood very clearly. We have given the patient no guarantees and we have made no promises. Enough time was given to the patient to ask questions, all of which were answered to the patient's satisfaction. Ms. Pyle has indicated that she wanted to continue with the procedure. Attestation: I, the ordering provider, attest that I have discussed with the patient the benefits, risks, side-effects, alternatives, likelihood of achieving goals, and potential problems during recovery for the procedure that I have provided informed consent. Date  Time: 11/26/2019  8:33 AM  Pre-Procedure Preparation:  Monitoring: As per clinic protocol. Respiration, ETCO2, SpO2, BP, heart rate and rhythm monitor placed and checked for adequate function Safety Precautions: Patient was assessed for positional comfort and pressure points before starting the procedure. Time-out: I initiated and conducted the "Time-out" before starting the procedure, as per protocol. The patient was asked to participate by confirming the accuracy of the "Time Out" information. Verification of the correct person, site, and procedure were performed and confirmed by me, the nursing staff, and the patient. "Time-out" conducted as per Joint Commission's Universal Protocol (UP.01.01.01). Time: 0923  Description of Procedure:          Target Area: For Lumbar Sympathetic Block(s), the target is the anterolateral aspect of the L3 & L4 vertebral bodies, where the lumbar sympathetic chain resides. Approach: Paravertebral, ipsilateral approach. Area Prepped: Entire Posterior Thoracolumbar Region DuraPrep (Iodine Povacrylex [0.7% available iodine] and Isopropyl Alcohol, 74% w/w) Safety  Precautions: Aspiration looking for blood return was conducted prior to all injections. At no point did we inject any substances, as a needle was being advanced. No attempts were made at seeking any paresthesias. Safe injection practices and needle disposal techniques used. Medications properly checked for expiration dates. SDV (single dose vial) medications used. Description of the Procedure: Protocol guidelines were followed. The patient was placed in position over  the procedure table. The target area was identified and the area prepped in the usual manner. Skin & deeper tissues infiltrated with local anesthetic. Appropriate amount of time allowed to pass for local anesthetics to take effect. The procedure needles were then advanced to the target area, the superior anterolateral border of the L3 vertebral body, under pulsed fluoroscopic guidance. Care was taken not to advance the tip of the needle past the anterior border of the vertebral body, on the lateral fluoroscopic view. Proper needle placement secured. Negative aspiration confirmed. Solution injected in intermittent fashion, asking for systemic symptoms every 0.5cc of injectate. The needles were then removed and the area cleansed, making sure to leave some of the prepping solution back to take advantage of its long term bactericidal properties.  Vitals:   11/26/19 0930 11/26/19 0937 11/26/19 0946 11/26/19 0957  BP: (!) 111/100 112/66 108/75 111/79  Pulse: 86     Resp: 14 12 13 20   Temp:  (!) 97.5 F (36.4 C)  (!) 97.1 F (36.2 C)  TempSrc:  Temporal  Temporal  SpO2: 93% 97% 97% 98%  Weight:      Height:        Start Time: 0923 hrs. End Time: 0929 hrs. Materials:  Needle(s) Type: Spinal Needle Gauge: 22G Length: 5-in Medication(s): Please see orders for medications and dosing details.  Imaging Guidance (Spinal):          Type of Imaging Technique: Fluoroscopy Guidance (Spinal) Indication(s): Assistance in needle guidance and  placement for procedures requiring needle placement in or near specific anatomical locations not easily accessible without such assistance. Exposure Time: Please see nurses notes. Contrast: Before injecting any contrast, we confirmed that the patient did not have an allergy to iodine, shellfish, or radiological contrast. Once satisfactory needle placement was completed at the desired level, radiological contrast was injected. Contrast injected under live fluoroscopy. No contrast complications. See chart for type and volume of contrast used. Fluoroscopic Guidance: I was personally present during the use of fluoroscopy. "Tunnel Vision Technique" used to obtain the best possible view of the target area. Parallax error corrected before commencing the procedure. "Direction-depth-direction" technique used to introduce the needle under continuous pulsed fluoroscopy. Once target was reached, antero-posterior, oblique, and lateral fluoroscopic projection used confirm needle placement in all planes. Images permanently stored in EMR. Interpretation: I personally interpreted the imaging intraoperatively. Adequate needle placement confirmed in multiple planes. Appropriate spread of contrast into desired area was observed. No evidence of afferent or efferent intravascular uptake. No intrathecal or subarachnoid spread observed. Permanent images saved into the patient's record.  Antibiotic Prophylaxis:   Anti-infectives (From admission, onward)   None     Indication(s): None identified  Post-operative Assessment:  Post-procedure Vital Signs:  Pulse/HCG Rate: 8679 Temp: (!) 97.1 F (36.2 C) (90.1 left and 80.9 right ) Resp: 20 BP: 111/79 SpO2: 98 %  EBL: None  Complications: No immediate post-treatment complications observed by team, or reported by patient.  Note: The patient tolerated the entire procedure well. A repeat set of vitals were taken after the procedure and the patient was kept under  observation following institutional policy, for this type of procedure. Post-procedural neurological assessment was performed, showing return to baseline, prior to discharge. The patient was provided with post-procedure discharge instructions, including a section on how to identify potential problems. Should any problems arise concerning this procedure, the patient was given instructions to immediately contact us, at any time, without hesitation. In any case, we plan to contact  the patient by telephone for a follow-up status report regarding this interventional procedure.  Comments:  No additional relevant information.  Plan of Care  Orders:  Orders Placed This Encounter  Procedures  . LUMBAR SYMPATHETIC BLOCK    For sympathetically-mediated lower extremity pain.    Scheduling Instructions:     Purpose: Diagnostic     Laterality: Left-sided     Level(s): Lumbar sympathetic chain (L3, L4)     Sedation: Sedation recommended.     Scheduling Timeframe: Today    Order Specific Question:   Where will this procedure be performed?    Answer:   [facility]  . DG PAIN [facility] C-ARM 1-60 MIN NO REPORT    Intraoperative interpretation by procedural physician at [facility].    Standing Status:   Standing    Number of Occurrences:   1    Order Specific Question:   Reason for exam:    Answer:   Assistance in needle guidance and placement for procedures requiring needle placement in or near specific anatomical locations not easily accessible without such assistance.  . Informed Consent Details: Physician/Practitioner Attestation; Transcribe to consent form and obtain patient signature    Provider Attestation: I, [place] Dossie Arbour, MD, (Pain Management Specialist), the physician/practitioner, attest that I have discussed with the patient the benefits, risks, side effects, alternatives, likelihood of achieving goals and potential problems during recovery for the procedure that I have  provided informed consent.    Scheduling Instructions:     Procedure: Diagnostic left lumbar sympathetic block #2     Indication/Reason: Left lower extremity pain/CRPS     Note: Always confirm laterality of pain with [name], before procedure.  . Provide equipment / supplies at bedside    Equipment required: Single use, disposable, "Block Tray"    Standing Status:   Standing    Number of Occurrences:   1    Order Specific Question:   Specify    Answer:   Block Tray  . Miscellanous precautions    Standing Status:   Standing    Number of Occurrences:   1  . Miscellanous precautions    NOTE: Although It is true that patients can have allergies to shellfish and that shellfish contain iodine, most shellfish  allergies are due to two protein allergens present in the shellfish: tropomyosins and parvalbumin. Not all patients with shellfish allergies are allergic to iodine. However, as a precaution, avoid using iodine containing products.    Standing Status:   Standing    Number of Occurrences:   1   Chronic Opioid Analgesic:  None provided by our practice. NO OPIOIDS: UDS (08/07/17) (+) for Unreported Benzoylecgonine, a metabolite of cocaine; its presence    indicates use of this drug.  Source is most commonly illicit.   Medications ordered for procedure: Meds ordered this encounter  Medications  . lidocaine (XYLOCAINE) 2 % (with pres) injection 400 mg  . lactated ringers infusion 1,000 mL  . midazolam (VERSED) 5 MG/5ML injection 1-2 mg    Make sure Flumazenil is available in the pyxis when using this medication. If oversedation occurs, administer 0.2 mg IV over 15 sec. If after 45 sec no response, administer 0.2 mg again over 1 min; may repeat at 1 min intervals; not to exceed 4 doses (1 mg)  . fentaNYL (SUBLIMAZE) injection 25-50 mcg    Make sure Narcan is available in the pyxis when using this medication. In the event of respiratory depression (RR<  8/min): Titrate NARCAN (naloxone) in  increments of 0.1 to 0.2 mg IV at 2-3 minute intervals, until desired degree of reversal.  . bupivacaine-epinephrine (MARCAINE W/ EPI) 0.25% -1:200000 injection 10 mL  . dexamethasone (DECADRON) injection 10 mg   Medications administered: We administered lidocaine, lactated ringers, midazolam, fentaNYL, bupivacaine-epinephrine, and dexamethasone.  See the medical record for exact dosing, route, and time of administration.  Follow-up plan:   Return in about 2 weeks (around 12/10/2019) for (PP), (VV).       Interventional treatment options:  Under consideration:   NOTE: (IODINE) SHELLFISH & CONTRAST ALLERGY!! (ANAPHYLACTIC)  Diagnostic midline LESI  Possible bilateral thoracic RFA  Diagnostic left IA knee injection    Therapeutic/palliative (PRN):   Diagnostic bilateral cervical facet block #2  Diagnostic bilateral Thoracic T7, T8, T9, & T10 Facet Medial Branch block #2 (80/100/100)  Diagnostic/therapeutic left CESI #2 (100/100/100)  Palliative bilateral lumbar facet block #3  Palliative left lumbar facet RFA #2 (last done 05/19/2019)  Palliative right lumbar facet RFA #1 (last done 06/30/2019)        Recent Visits Date Type Provider Dept  11/17/19 Telemedicine Milinda Pointer, MD [facility]  11/03/19 Procedure visit Milinda Pointer, MD [facility]  10/27/19 Office Visit Milinda Pointer, MD [facility]  Showing recent visits within past 90 days and meeting all other requirements Today's Visits Date Type Provider Dept  11/26/19 Procedure visit Milinda Pointer, MD [facility]  Showing today's visits and meeting all other requirements Future Appointments Date Type Provider Dept  12/10/19 Appointment Milinda Pointer, MD [facility]  Showing future appointments within next 90 days and meeting all other requirements  Disposition: Discharge home  Discharge (Date  Time): 11/26/2019; 1015 hrs.   Primary Care Physician:  Tracie Harrier, MD Location: [facility] Note by: Gaspar Cola, MD Date: 11/26/2019; Time: 10:25 AM  Disclaimer:  Medicine is not an exact science. The only guarantee in medicine is that nothing is guaranteed. It is important to note that the decision to proceed with this intervention was based on the information collected from the patient. The Data and conclusions were drawn from the patient's questionnaire, the interview, and the physical examination. Because the information was provided in large part by the patient, it cannot be guaranteed that it has not been purposely or unconsciously manipulated. Every effort has been made to obtain as much relevant data as possible for this evaluation. It is important to note that the conclusions that lead to this procedure are derived in large part from the available data. Always take into account that the treatment will also be dependent on availability of resources and existing treatment guidelines, considered by other Pain Management Practitioners as being common knowledge and practice, at the time of the intervention. For Medico-Legal purposes, it is also important to point out that variation in procedural techniques and pharmacological choices are the acceptable norm. The indications, contraindications, technique, and results of the above procedure should only be interpreted and judged by a Board-Certified Interventional Pain Specialist with extensive familiarity and expertise in the same exact procedure and technique.

## 2019-11-26 NOTE — Progress Notes (Signed)
Safety precautions to be maintained throughout the outpatient stay will include: orient to surroundings, keep bed in low position, maintain call bell within reach at all times, provide assistance with transfer out of bed and ambulation.  

## 2019-11-27 ENCOUNTER — Telehealth: Payer: Self-pay | Admitting: *Deleted

## 2019-11-27 NOTE — Telephone Encounter (Signed)
Left voicemail with patient re; procedure on yesterday.  Asked to please call if there are questions or concerns.

## 2019-12-08 ENCOUNTER — Encounter: Payer: Self-pay | Admitting: Pain Medicine

## 2019-12-09 ENCOUNTER — Encounter: Payer: Self-pay | Admitting: Pain Medicine

## 2019-12-09 NOTE — Progress Notes (Signed)
Patient: Samantha Macias  Service Category: E/M  Provider: Gaspar Cola, MD  DOB: 1961-03-11  DOS: 12/10/2019  Location: Office  MRN: [id number]  Setting: Ambulatory outpatient  Referring Provider: Tracie Harrier, MD  Type: Established Patient  Specialty: Interventional Pain Management  PCP: Tracie Harrier, MD  Location: Remote location  Delivery: TeleHealth     Virtual Encounter - Pain Management PROVIDER NOTE: Information contained herein reflects review and annotations entered in association with encounter. Interpretation of such information and data should be left to medically-trained personnel. Information provided to patient can be located elsewhere in the medical record under "Patient Instructions". Document created using STT-dictation technology, any transcriptional errors that may result from process are unintentional.    Contact & Pharmacy Preferred: [phone] Home: [phone] (home) Mobile: [phone] (mobile) E-mail: [email or web address]  [place], [address] [place] [address] Phone: [phone] Fax: [phone]   Pre-screening  Samantha Macias offered "in-person" vs "virtual" encounter. She indicated preferring virtual for this encounter.   Reason COVID-19*   Social distancing based on CDC and AMA recommendations.   I contacted Samantha Macias on 12/10/2019 via telephone.      I clearly identified myself as Gaspar Cola, MD. I verified that I was speaking with the correct person using two identifiers (Name: Samantha Macias, and date of birth: [birth year]).  Consent I sought verbal advanced consent from Samantha Macias for virtual visit interactions. I informed Samantha Macias of possible security and privacy concerns, risks, and limitations associated with providing "not-in-person" medical evaluation and management services. I also informed Samantha Macias of the availability of "in-person" appointments.  Finally, I informed her that there would be a charge for the virtual visit and that she could be  personally, fully or partially, financially responsible for it. Samantha Macias expressed understanding and agreed to proceed.   Historic Elements   Samantha Macias is a 59 y.o. year old, female patient evaluated today after her last contact with our practice on 11/27/2019. Samantha Macias  has a past medical history of Abnormal gait, Allergy, Anxiety, Arthritis, Back pain, Bipolar disorder ([place]), CAD (coronary artery disease), Chronic kidney disease, Collagen vascular disease ([place]), Colon polyp, COPD (chronic obstructive pulmonary disease) ([place]), [place], Fibromyalgia, GERD (gastroesophageal reflux disease), Hyperlipidemia, Hypertension, IBS (irritable bowel syndrome), Kidney disease, Migraine, Numbness and tingling, Renal insufficiency, Spinal stenosis, Static encephalopathy, Thyroid nodule (07/2017), and TIA (transient ischemic attack). She also  has a past surgical history that includes Tubal ligation; Nasal sinus surgery; Colonoscopy with propofol (N/A, 04/01/2017); Esophagogastroduodenoscopy (egd) with propofol (N/A, 04/01/2017); and Mouth surgery. Samantha Macias has a current medication list which includes the following prescription(s): albuterol, bupropion, diurex, vitamin d, diclofenac sodium, tylenol pm extra strength, donepezil, hydrocodone-acetaminophen, losartan-hydrochlorothiazide, meloxicam, multiple vitamins-minerals, pantoprazole, pregabalin, sertraline, spiriva handihaler, temazepam, tizanidine, and anoro ellipta. She  reports that she has been smoking cigarettes. She has a 6.25 pack-year smoking history. She has never used smokeless tobacco. She reports current drug use. Drug: Cocaine. No history on file for alcohol use. Samantha Macias is allergic to contrast media [iodinated diagnostic agents], shellfish allergy, tetanus toxoid adsorbed, ace inhibitors, codeine, nitrofurantoin monohyd macro, and red dye.    HPI  Today, she is being contacted for a post-procedure assessment.  According to the patient, this is second lumbar sympathetic block was a little different from the first 1.  This 1 provided her with 100% relief of all of her pain for approximately 6  to 12 hours.  It was not until 4:30 in the morning that she began to experience some cramps and the pain returned.  However, she has noticed a difference in terms of the frequency.  She has noticed that the frequency of the episodes has decreased.  However, the intensity has not.  She also noticed that her nails have again began to grow in her hands and to a certain extent in her feet.  Today I had a long conversation with this patient and I have talked to her about the possibility of doing a series of these lumbar sympathetic blocks to see what kind of response we get from them.  We plan to do them every 2 weeks and to compare the results and as long as we continue to see some improvement in your the frequency or the intensity of the pain we will continue with those blocks.  At that point where we reach a plateau where we see no further any improvement in frequency or intensity, then we will need to reassess our plan and consider other possibilities such as a sympathectomy or perhaps an implant.  She understood and accepted.  Post-Procedure Evaluation  Procedure: Diagnostic left lumbar sympathetic block (L3, L4) #2 under fluoroscopic guidance and IV sedation Pre-procedure pain level: 6/10 Post-procedure: 0/10 (100% relief)  Sedation: Sedation provided.  Effectiveness during initial hour after procedure(Ultra-Short Term Relief): 100 %.  Local anesthetic used: Long-acting (4-6 hours) Effectiveness: Defined as any analgesic benefit obtained secondary to the administration of local anesthetics. This carries significant diagnostic value as to the etiological location, or anatomical origin, of the pain. Duration of benefit is expected to coincide with the  duration of the local anesthetic used.  Effectiveness during initial 4-6 hours after procedure(Short-Term Relief): 100 %.  Long-term benefit: Defined as any relief past the pharmacologic duration of the local anesthetics.  Effectiveness past the initial 6 hours after procedure(Long-Term Relief): 0 % (patient states that this is the first time she has felt discouraged. the pain is still very severe but might not be occurring as often.).  Current benefits: Defined as benefit that persist at this time.   Analgesia:  Back to baseline, but with less frequent episodes. Function: Somewhat improved ROM: Somewhat improved  Pharmacotherapy Assessment  Analgesic: None provided by our practice. NO OPIOIDS: UDS (08/07/17) (+) for Unreported Benzoylecgonine, a metabolite of cocaine; its presence    indicates use of this drug.  Source is most commonly illicit.   Monitoring: [place] PMP: PDMP reviewed during this encounter.       Pharmacotherapy: No side-effects or adverse reactions reported. Compliance: No problems identified. Effectiveness: Clinically acceptable. Plan: Refer to "POC".  UDS:  Summary  Date Value Ref Range Status  03/17/2018 FINAL  Final    Comment:    ==================================================================== TOXAS[facility]RE COMP DRUG ANALYSIS,UR ==================================================================== Test                             Result       Flag       Units Drug Present and Declared for Prescription Verification   Tramadol                       1825         EXPECTED   ng/mg creat   O-Desmethyltramadol            929  EXPECTED   ng/mg creat   N-Desmethyltramadol            1352         EXPECTED   ng/mg creat    Source of tramadol is a prescription medication.    O-desmethyltramadol and N-desmethyltramadol are expected    metabolites of tramadol.   Pregabalin                     PRESENT      EXPECTED   Tizanidine                     PRESENT       EXPECTED   Bupropion                      PRESENT      EXPECTED   Hydroxybupropion               PRESENT      EXPECTED    Hydroxybupropion is an expected metabolite of bupropion.   Mirtazapine                    PRESENT      EXPECTED   Quetiapine                     PRESENT      EXPECTED   Salicylate                     PRESENT      EXPECTED Drug Present not Declared for Prescription Verification   Benzoylecgonine                494          UNEXPECTED ng/mg creat    Benzoylecgonine is a metabolite of cocaine; its presence    indicates use of this drug.  Source is most commonly illicit, but    cocaine is present in some topical anesthetic solutions.   Acetaminophen                  PRESENT      UNEXPECTED   Diphenhydramine                PRESENT      UNEXPECTED Drug Absent but Declared for Prescription Verification   Alprazolam                     Not Detected UNEXPECTED ng/mg creat   Sertraline                     Not Detected UNEXPECTED   Diclofenac                     Not Detected UNEXPECTED    Topical diclofenac, as indicated in the declared medication list,    is not always detected even when used as directed.   Ibuprofen                      Not Detected UNEXPECTED    Ibuprofen, as indicated in the declared medication list, is not    always detected even when used as directed. ==================================================================== Test                      Result    Flag   Units      Ref Range  Creatinine              159              mg/dL      >=20 ==================================================================== Declared Medications:  The flagging and interpretation on this report are based on the  following declared medications.  Unexpected results may arise from  inaccuracies in the declared medications.  **Note: The testing scope of this panel includes these medications:  Alprazolam (Xanax)  Bupropion (Wellbutrin)  Mirtazapine (Remeron)  Pregabalin  (Lyrica)  Quetiapine (Seroquel)  Sertraline (Zoloft)  Tramadol (Ultram)  **Note: The testing scope of this panel does not include small to  moderate amounts of these reported medications:  Aspirin (Aspirin 81)  Ibuprofen  Tizanidine (Zanaflex)  Topical Diclofenac  **Note: The testing scope of this panel does not include following  reported medications:  Albuterol  Donepezil (Aricept)  Formoterol (Perforomist)  Hydrochlorothiazide (Hyzaar)  Ipratropium  Losartan (Hyzaar)  Omeprazole (Prilosec)  Pantoprazole (Protonix)  Ramelteon (Rozerem)  Tiotropium (Spiriva)  Vitamin D ==================================================================== For clinical consultation, please call [phone]. ====================================================================     Laboratory Chemistry Profile   Renal Lab Results  Component Value Date   BUN 22 03/17/2018   CREATININE 1.35 (H) 03/17/2018   BCR 16 03/17/2018   GFRAA 51 (L) 03/17/2018   GFRNONAA 44 (L) 03/17/2018     Hepatic Lab Results  Component Value Date   AST 13 03/17/2018   ALT 15 12/24/2017   ALBUMIN 4.4 03/17/2018   ALKPHOS 148 (H) 03/17/2018   LIPASE 37 12/24/2017   AMMONIA 23 07/07/2015     Electrolytes Lab Results  Component Value Date   NA 141 03/17/2018   K 4.3 03/17/2018   CL 99 03/17/2018   CALCIUM 9.1 03/17/2018   MG 1.8 03/17/2018     Bone Lab Results  Component Value Date   25OHVITD1 41 03/17/2018   25OHVITD2 <1.0 03/17/2018   25OHVITD3 41 03/17/2018     Inflammation (CRP: Acute Phase) (ESR: Chronic Phase) Lab Results  Component Value Date   CRP 17 (H) 03/17/2018   ESRSEDRATE 28 03/17/2018       Note: Above Lab results reviewed.   Imaging  DG PAIN CLINIC C-ARM 1-60 MIN NO REPORT Fluoro was used, but no Radiologist interpretation will be provided.  Please refer to "NOTES" tab for provider progress note.  Assessment  The primary encounter diagnosis was Chronic pain  syndrome. Diagnoses of Complex regional pain syndrome type 1 of left lower extremity and Chronic lower extremity pain (Secondary Area of Pain) (Bilateral) (L>R) were also pertinent to this visit.  Plan of Care  Problem-specific:  No problem-specific Assessment & Plan notes found for this encounter.  Ms. Leylany Nored has a current medication list which includes the following long-term medication(s): albuterol, bupropion, tylenol pm extra strength, donepezil, losartan-hydrochlorothiazide, pantoprazole, pregabalin, sertraline, spiriva handihaler, and temazepam.  Pharmacotherapy (Medications Ordered): No orders of the defined types were placed in this encounter.  Orders:  Orders Placed This Encounter  Procedures   LUMBAR SYMPATHETIC BLOCK    For sympathetically-mediated lower extremity pain.    Standing Status:   Future    Standing Expiration Date:   01/09/2020    Scheduling Instructions:     Purpose: Therapeutic     Laterality: Left-sided     Level(s): Lumbar sympathetic chain (L3, L4)     Sedation: Sedation recommended.     Scheduling Timeframe: ASAP    Order Specific Question:   Where will this  procedure be performed?    Answer:   [facility]   Follow-up plan:   Return for Procedure (w/ sedation): (L) LSB #3.      Interventional treatment options:  Under consideration:   NOTE: (IODINE) SHELLFISH & CONTRAST ALLERGY!! (ANAPHYLACTIC)  Diagnostic midline LESI  Possible bilateral thoracic RFA  Diagnostic left IA knee injection    Therapeutic/palliative (PRN):   Diagnostic bilateral cervical facet block #2  Diagnostic bilateral Thoracic T7, T8, T9, & T10 Facet Medial Branch block #2 (80/100/100)  Diagnostic/therapeutic left CESI #2 (100/100/100)  Palliative bilateral lumbar facet block #3  Palliative left lumbar facet RFA #2 (last done 05/19/2019)  Palliative right lumbar facet RFA #1 (last done 06/30/2019)     Recent Visits Date Type Provider Dept  11/26/19  Procedure visit Milinda Pointer, MD [facility]  11/17/19 Telemedicine Milinda Pointer, MD [facility]  11/03/19 Procedure visit Milinda Pointer, MD [facility]  10/27/19 Office Visit Milinda Pointer, MD [facility]  Showing recent visits within past 90 days and meeting all other requirements Today's Visits Date Type Provider Dept  12/10/19 Telemedicine Milinda Pointer, MD [facility]  Showing today's visits and meeting all other requirements Future Appointments No visits were found meeting these conditions. Showing future appointments within next 90 days and meeting all other requirements  I discussed the assessment and treatment plan with the patient. The patient was provided an opportunity to ask questions and all were answered. The patient agreed with the plan and demonstrated an understanding of the instructions.  Patient advised to call back or seek an in-person evaluation if the symptoms or condition worsens.  Duration of encounter: 20 minutes.  Note by: Gaspar Cola, MD Date: 12/10/2019; Time: 3:36 PM

## 2019-12-10 ENCOUNTER — Ambulatory Visit: Payer: Medicare Other | Attending: Pain Medicine | Admitting: Pain Medicine

## 2019-12-10 ENCOUNTER — Other Ambulatory Visit: Payer: Self-pay

## 2019-12-10 DIAGNOSIS — G8929 Other chronic pain: Secondary | ICD-10-CM

## 2019-12-10 DIAGNOSIS — M79605 Pain in left leg: Secondary | ICD-10-CM | POA: Diagnosis not present

## 2019-12-10 DIAGNOSIS — M79604 Pain in right leg: Secondary | ICD-10-CM

## 2019-12-10 DIAGNOSIS — G90522 Complex regional pain syndrome I of left lower limb: Secondary | ICD-10-CM | POA: Diagnosis not present

## 2019-12-10 DIAGNOSIS — G894 Chronic pain syndrome: Secondary | ICD-10-CM

## 2019-12-10 NOTE — Patient Instructions (Signed)
____________________________________________________________________________________________  Preparing for Procedure with Sedation  Procedure appointments are limited to planned procedures: . No Prescription Refills. . No disability issues will be discussed. . No medication changes will be discussed.  Instructions: . Oral Intake: Do not eat or drink anything for at least 8 hours prior to your procedure. (Exception: Blood Pressure Medication. See below.) . Transportation: Unless otherwise stated by your physician, you may drive yourself after the procedure. . Blood Pressure Medicine: Do not forget to take your blood pressure medicine with a sip of water the morning of the procedure. If your Diastolic (lower reading)is above 100 mmHg, elective cases will be cancelled/rescheduled. . Blood thinners: These will need to be stopped for procedures. Notify our staff if you are taking any blood thinners. Depending on which one you take, there will be specific instructions on how and when to stop it. . Diabetics on insulin: Notify the staff so that you can be scheduled 1st case in the morning. If your diabetes requires high dose insulin, take only  of your normal insulin dose the morning of the procedure and notify the staff that you have done so. . Preventing infections: Shower with an antibacterial soap the morning of your procedure. . Build-up your immune system: Take 1000 mg of Vitamin C with every meal (3 times a day) the day prior to your procedure. . Antibiotics: Inform the staff if you have a condition or reason that requires you to take antibiotics before dental procedures. . Pregnancy: If you are pregnant, call and cancel the procedure. . Sickness: If you have a cold, fever, or any active infections, call and cancel the procedure. . Arrival: You must be in the facility at least 30 minutes prior to your scheduled procedure. . Children: Do not bring children with you. . Dress appropriately:  Bring dark clothing that you would not mind if they get stained. . Valuables: Do not bring any jewelry or valuables.  Reasons to call and reschedule or cancel your procedure: (Following these recommendations will minimize the risk of a serious complication.) . Surgeries: Avoid having procedures within 2 weeks of any surgery. (Avoid for 2 weeks before or after any surgery). . Flu Shots: Avoid having procedures within 2 weeks of a flu shots or . (Avoid for 2 weeks before or after immunizations). . Barium: Avoid having a procedure within 7-10 days after having had a radiological study involving the use of radiological contrast. (Myelograms, Barium swallow or enema study). . Heart attacks: Avoid any elective procedures or surgeries for the initial 6 months after a "Myocardial Infarction" (Heart Attack). . Blood thinners: It is imperative that you stop these medications before procedures. Let us know if you if you take any blood thinner.  . Infection: Avoid procedures during or within two weeks of an infection (including chest colds or gastrointestinal problems). Symptoms associated with infections include: Localized redness, fever, chills, night sweats or profuse sweating, burning sensation when voiding, cough, congestion, stuffiness, runny nose, sore throat, diarrhea, nausea, vomiting, cold or Flu symptoms, recent or current infections. It is specially important if the infection is over the area that we intend to treat. . Heart and lung problems: Symptoms that may suggest an active cardiopulmonary problem include: cough, chest pain, breathing difficulties or shortness of breath, dizziness, ankle swelling, uncontrolled high or unusually low blood pressure, and/or palpitations. If you are experiencing any of these symptoms, cancel your procedure and contact your primary care physician for an evaluation.  Remember:  Regular Business hours are:    Monday to Thursday 8:00 AM to 4:00 PM  Provider's  Schedule: Odessie Polzin, MD:  Procedure days: Tuesday and Thursday 7:30 AM to 4:00 PM  Bilal Lateef, MD:  Procedure days: Monday and Wednesday 7:30 AM to 4:00 PM ____________________________________________________________________________________________    

## 2019-12-16 ENCOUNTER — Other Ambulatory Visit: Payer: Self-pay

## 2019-12-16 ENCOUNTER — Telehealth (HOSPITAL_COMMUNITY): Payer: Medicare Other | Admitting: Psychiatry

## 2019-12-22 ENCOUNTER — Ambulatory Visit
Admission: RE | Admit: 2019-12-22 | Discharge: 2019-12-22 | Disposition: A | Discharge status: Home / Self Care | Payer: Medicare Other | Source: Ambulatory Visit | Attending: Pain Medicine | Admitting: Pain Medicine

## 2019-12-22 ENCOUNTER — Encounter: Payer: Self-pay | Admitting: Pain Medicine

## 2019-12-22 ENCOUNTER — Other Ambulatory Visit: Payer: Self-pay

## 2019-12-22 ENCOUNTER — Ambulatory Visit (HOSPITAL_BASED_OUTPATIENT_CLINIC_OR_DEPARTMENT_OTHER): Payer: Medicare Other | Admitting: Pain Medicine

## 2019-12-22 VITALS — BP 123/63 | HR 87 | Temp 96.5°F | Resp 18 | Ht 67.0 in | Wt 218.0 lb

## 2019-12-22 DIAGNOSIS — Z91013 Allergy to seafood: Secondary | ICD-10-CM

## 2019-12-22 DIAGNOSIS — M5136 Other intervertebral disc degeneration, lumbar region: Secondary | ICD-10-CM | POA: Insufficient documentation

## 2019-12-22 DIAGNOSIS — G90522 Complex regional pain syndrome I of left lower limb: Secondary | ICD-10-CM | POA: Diagnosis present

## 2019-12-22 DIAGNOSIS — M4807 Spinal stenosis, lumbosacral region: Secondary | ICD-10-CM | POA: Insufficient documentation

## 2019-12-22 DIAGNOSIS — Z87892 Personal history of anaphylaxis: Secondary | ICD-10-CM

## 2019-12-22 DIAGNOSIS — Z91041 Radiographic dye allergy status: Secondary | ICD-10-CM | POA: Insufficient documentation

## 2019-12-22 DIAGNOSIS — M79604 Pain in right leg: Secondary | ICD-10-CM | POA: Insufficient documentation

## 2019-12-22 DIAGNOSIS — M5127 Other intervertebral disc displacement, lumbosacral region: Secondary | ICD-10-CM | POA: Insufficient documentation

## 2019-12-22 DIAGNOSIS — M79605 Pain in left leg: Secondary | ICD-10-CM | POA: Insufficient documentation

## 2019-12-22 DIAGNOSIS — G8929 Other chronic pain: Secondary | ICD-10-CM | POA: Diagnosis present

## 2019-12-22 DIAGNOSIS — M48061 Spinal stenosis, lumbar region without neurogenic claudication: Secondary | ICD-10-CM | POA: Insufficient documentation

## 2019-12-22 MED ORDER — BUPIVACAINE-EPINEPHRINE (PF) 0.25% -1:200000 IJ SOLN
10.0000 mL | Freq: Once | INTRAMUSCULAR | Status: AC
  Administered 2019-12-22: 10 mL
  Filled 2019-12-22: qty 30

## 2019-12-22 MED ORDER — MIDAZOLAM HCL 5 MG/5ML IJ SOLN
1.0000 mg | INTRAMUSCULAR | Status: DC | PRN
  Administered 2019-12-22: 2 mg via INTRAVENOUS
  Filled 2019-12-22: qty 5

## 2019-12-22 MED ORDER — DEXAMETHASONE SODIUM PHOSPHATE 10 MG/ML IJ SOLN
10.0000 mg | Freq: Once | INTRAMUSCULAR | Status: AC
  Administered 2019-12-22: 10 mg
  Filled 2019-12-22: qty 1

## 2019-12-22 MED ORDER — LACTATED RINGERS IV SOLN
1000.0000 mL | Freq: Once | INTRAVENOUS | Status: AC
  Administered 2019-12-22: 1000 mL via INTRAVENOUS

## 2019-12-22 MED ORDER — LIDOCAINE HCL 2 % IJ SOLN
20.0000 mL | Freq: Once | INTRAMUSCULAR | Status: AC
  Administered 2019-12-22: 400 mg
  Filled 2019-12-22: qty 20

## 2019-12-22 MED ORDER — FENTANYL CITRATE (PF) 100 MCG/2ML IJ SOLN
25.0000 ug | INTRAMUSCULAR | Status: DC | PRN
  Administered 2019-12-22: 50 ug via INTRAVENOUS
  Filled 2019-12-22: qty 2

## 2019-12-22 NOTE — Progress Notes (Signed)
PROVIDER NOTE: Information contained herein reflects review and annotations entered in association with encounter. Interpretation of such information and data should be left to medically-trained personnel. Information provided to patient can be located elsewhere in the medical record under "Patient Instructions". Document created using STT-dictation technology, any transcriptional errors that may result from process are unintentional.    Patient: Samantha Macias  Service Category: Procedure  Provider: Gaspar Cola, MD  DOB: Jul 08, 1960  DOS: 12/22/2019  Location: [facility]  MRN: [id number]  Setting: Ambulatory - outpatient  Referring Provider: Tracie Harrier, MD  Type: Established Patient  Specialty: Interventional Pain Management  PCP: Tracie Harrier, MD   Primary Reason for Visit: Interventional Pain Management Treatment. CC: Foot Pain (left)  Procedure:          Anesthesia, Analgesia, Anxiolysis:  Type: Therapeutic Lumbar Sympathetic Block #3  Region:Lumbosacral Level: L3, L4 Laterality: Left-Sided Paravertebral  Type: Moderate (Conscious) Sedation combined with Local Anesthesia Indication(s): Analgesia and Anxiety Route: Intravenous (IV) IV Access: Secured Sedation: Meaningful verbal contact was maintained at all times during the procedure  Local Anesthetic: Lidocaine 1-2%  Position: Prone with head of the table was raised to facilitate breathing.   Indications: 1. Complex regional pain syndrome type 1 of left lower extremity   2. DDD (degenerative disc disease), lumbar   3. Chronic lower extremity pain (Secondary Area of Pain) (Bilateral) (L>R)   4. History of allergy to radiographic contrast media   5. History of allergy to shellfish   6. History of anaphylaxis    Pain Score: Pre-procedure: 5 /10 Post-procedure: 5 /10   Pre-op Assessment:  [name] is a 59 y.o. (year old), female patient, seen today for interventional treatment. She  has a  past surgical history that includes Tubal ligation; Nasal sinus surgery; Colonoscopy with propofol (N/A, 04/01/2017); Esophagogastroduodenoscopy (egd) with propofol (N/A, 04/01/2017); and Mouth surgery. [name] has a current medication list which includes the following prescription(s): albuterol, bupropion, diurex, vitamin d, diclofenac sodium, tylenol pm extra strength, donepezil, hydrocodone-acetaminophen, losartan-hydrochlorothiazide, meloxicam, multiple vitamins-minerals, pantoprazole, pregabalin, rybelsus, sertraline, spiriva handihaler, temazepam, tizanidine, and anoro ellipta, and the following Facility-Administered Medications: fentanyl and midazolam. Her primarily concern today is the Foot Pain (left)  Initial Vital Signs:  Pulse/HCG Rate: 87ECG Heart Rate: 76 (NSR) Temp: 97.8 F (36.6 C) Resp: 18 BP: 116/75 SpO2: 96 %  BMI: Estimated body mass index is 34.14 kg/m as calculated from the following:   Height as of this encounter: 5\' 7"  (1.702 m).   Weight as of this encounter: 218 lb (98.9 kg).  Risk Assessment: Allergies: Reviewed. She is allergic to contrast media [iodinated diagnostic agents], shellfish allergy, tetanus toxoid adsorbed, ace inhibitors, codeine, nitrofurantoin monohyd macro, and red dye.  Allergy Precautions: None required Coagulopathies: Reviewed. None identified.  Blood-thinner therapy: None at this time Active Infection(s): Reviewed. None identified. [name] is afebrile  Site Confirmation: [name] was asked to confirm the procedure and laterality before marking the site Procedure checklist: Completed Consent: Before the procedure and under the influence of no sedative(s), amnesic(s), or anxiolytics, the patient was informed of the treatment options, risks and possible complications. To fulfill our ethical and legal obligations, as recommended by the American Medical Association's Code of Ethics, I have informed the patient of my clinical impression; the  nature and purpose of the treatment or procedure; the risks, benefits, and possible complications of the intervention; the alternatives, including doing nothing; the risk(s) and benefit(s) of the alternative treatment(s) or procedure(s);  and the risk(s) and benefit(s) of doing nothing. The patient was provided information about the general risks and possible complications associated with the procedure. These may include, but are not limited to: failure to achieve desired goals, infection, bleeding, organ or nerve damage, allergic reactions, paralysis, and death. In addition, the patient was informed of those risks and complications associated to Spine-related procedures, such as failure to decrease pain; infection (i.e.: Meningitis, epidural or intraspinal abscess); bleeding (i.e.: epidural hematoma, subarachnoid hemorrhage, or any other type of intraspinal or peri-dural bleeding); organ or nerve damage (i.e.: Any type of peripheral nerve, nerve root, or spinal cord injury) with subsequent damage to sensory, motor, and/or autonomic systems, resulting in permanent pain, numbness, and/or weakness of one or several areas of the body; allergic reactions; (i.e.: anaphylactic reaction); and/or death. Furthermore, the patient was informed of those risks and complications associated with the medications. These include, but are not limited to: allergic reactions (i.e.: anaphylactic or anaphylactoid reaction(s)); adrenal axis suppression; blood sugar elevation that in diabetics may result in ketoacidosis or comma; water retention that in patients with history of congestive heart failure may result in shortness of breath, pulmonary edema, and decompensation with resultant heart failure; weight gain; swelling or edema; medication-induced neural toxicity; particulate matter embolism and blood vessel occlusion with resultant organ, and/or nervous system infarction; and/or aseptic necrosis of one or more joints. Finally, the  patient was informed that Medicine is not an exact science; therefore, there is also the possibility of unforeseen or unpredictable risks and/or possible complications that may result in a catastrophic outcome. The patient indicated having understood very clearly. We have given the patient no guarantees and we have made no promises. Enough time was given to the patient to ask questions, all of which were answered to the patient's satisfaction. Ms. Breisch has indicated that she wanted to continue with the procedure. Attestation: I, the ordering provider, attest that I have discussed with the patient the benefits, risks, side-effects, alternatives, likelihood of achieving goals, and potential problems during recovery for the procedure that I have provided informed consent. Date  Time: 12/22/2019  9:01 AM  Pre-Procedure Preparation:  Monitoring: As per clinic protocol. Respiration, ETCO2, SpO2, BP, heart rate and rhythm monitor placed and checked for adequate function Safety Precautions: Patient was assessed for positional comfort and pressure points before starting the procedure. Time-out: I initiated and conducted the "Time-out" before starting the procedure, as per protocol. The patient was asked to participate by confirming the accuracy of the "Time Out" information. Verification of the correct person, site, and procedure were performed and confirmed by me, the nursing staff, and the patient. "Time-out" conducted as per Joint Commission's Universal Protocol (UP.01.01.01). Time: 4782  Description of Procedure:          Target Area: For Lumbar Sympathetic Block(s), the target is the anterolateral aspect of the L3 & L4 vertebral bodies, where the lumbar sympathetic chain resides. Approach: Paravertebral, ipsilateral approach. Area Prepped: Entire Posterior Thoracolumbar Region DuraPrep (Iodine Povacrylex [0.7% available iodine] and Isopropyl Alcohol, 74% w/w) Safety Precautions: Aspiration looking for  blood return was conducted prior to all injections. At no point did we inject any substances, as a needle was being advanced. No attempts were made at seeking any paresthesias. Safe injection practices and needle disposal techniques used. Medications properly checked for expiration dates. SDV (single dose vial) medications used. Description of the Procedure: Protocol guidelines were followed. The patient was placed in position over the procedure table. The target area was  identified and the area prepped in the usual manner. Skin & deeper tissues infiltrated with local anesthetic. Appropriate amount of time allowed to pass for local anesthetics to take effect. The procedure needles were then advanced to the target area, the superior anterolateral border of the L3 vertebral body, under pulsed fluoroscopic guidance. Care was taken not to advance the tip of the needle past the anterior border of the vertebral body, on the lateral fluoroscopic view. Proper needle placement secured. Negative aspiration confirmed. Solution injected in intermittent fashion, asking for systemic symptoms every 0.5cc of injectate. The needles were then removed and the area cleansed, making sure to leave some of the prepping solution back to take advantage of its long term bactericidal properties. Vitals:   12/22/19 0942 12/22/19 0943 12/22/19 0948 12/22/19 0958  BP:  (!) 113/95 106/86 97/74  Pulse:      Resp:  14 (!) 9 15  Temp:      TempSrc:      SpO2: 93% 93% 91% 97%  Weight:      Height:        Start Time: 0938 hrs. End Time: 0948 hrs. Materials:  Needle(s) Type: Spinal Needle Gauge: 22G Length: 3.5-in Medication(s): Please see orders for medications and dosing details.  Imaging Guidance (Spinal):          Type of Imaging Technique: Fluoroscopy Guidance (Spinal) Indication(s): Assistance in needle guidance and placement for procedures requiring needle placement in or near specific anatomical locations not easily  accessible without such assistance. Exposure Time: Please see nurses notes. Contrast: Before injecting any contrast, we confirmed that the patient did not have an allergy to iodine, shellfish, or radiological contrast. Once satisfactory needle placement was completed at the desired level, radiological contrast was injected. Contrast injected under live fluoroscopy. No contrast complications. See chart for type and volume of contrast used. Fluoroscopic Guidance: I was personally present during the use of fluoroscopy. "Tunnel Vision Technique" used to obtain the best possible view of the target area. Parallax error corrected before commencing the procedure. "Direction-depth-direction" technique used to introduce the needle under continuous pulsed fluoroscopy. Once target was reached, antero-posterior, oblique, and lateral fluoroscopic projection used confirm needle placement in all planes. Images permanently stored in EMR. Interpretation: I personally interpreted the imaging intraoperatively. Adequate needle placement confirmed in multiple planes. Appropriate spread of contrast into desired area was observed. No evidence of afferent or efferent intravascular uptake. No intrathecal or subarachnoid spread observed. Permanent images saved into the patient's record.  Antibiotic Prophylaxis:   Anti-infectives (From admission, onward)   None     Indication(s): None identified  Post-operative Assessment:  Post-procedure Vital Signs:  Pulse/HCG Rate: 8776 Temp:  (right foot 86.9, left foot 88.7) Resp: 15 BP: 97/74 SpO2: 97 %  EBL: None  Complications: No immediate post-treatment complications observed by team, or reported by patient.  Note: The patient tolerated the entire procedure well. A repeat set of vitals were taken after the procedure and the patient was kept under observation following institutional policy, for this type of procedure. Post-procedural neurological assessment was performed,  showing return to baseline, prior to discharge. The patient was provided with post-procedure discharge instructions, including a section on how to identify potential problems. Should any problems arise concerning this procedure, the patient was given instructions to immediately contact us, at any time, without hesitation. In any case, we plan to contact the patient by telephone for a follow-up status report regarding this interventional procedure.  Comments:  No additional relevant  information.  Plan of Care  Orders:  Orders Placed This Encounter  Procedures  . LUMBAR SYMPATHETIC BLOCK    For sympathetically-mediated lower extremity pain.    Scheduling Instructions:     Purpose: Therapeutic     Laterality: Left-sided     Level(s): Lumbar sympathetic chain (L3, L4)     Sedation: Sedation recommended.     Scheduling Timeframe: Today    Order Specific Question:   Where will this procedure be performed?    Answer:   [facility]  . LUMBAR SYMPATHETIC BLOCK    For sympathetically-mediated lower extremity pain.    Standing Status:   Future    Standing Expiration Date:   01/21/2020    Scheduling Instructions:     Purpose: Therapeutic #4     Laterality: Left-sided     Level(s): Lumbar sympathetic chain (L3, L4)     Sedation: Sedation recommended.     Scheduling Timeframe: 2 weeks    Order Specific Question:   Where will this procedure be performed?    Answer:   [facility]  . DG PAIN CLIN[facility] C-ARM 1-60 MIN NO REPORT    Intraoperative interpretation by procedural physician at [place].    Standing Status:   Standing    Number of Occurrences:   1    Order Specific Question:   Reason for exam:    Answer:   Assistance in needle guidance and placement for procedures requiring needle placement in or near specific anatomical locations not easily accessible without such assistance.  . Informed Consent Details: Physician/Practitioner Attestation; Transcribe to consent  form and obtain patient signature    Provider Attestation: I, [place] Dossie Arbour, MD, (Pain Management Specialist), the physician/practitioner, attest that I have discussed with the patient the benefits, risks, side effects, alternatives, likelihood of achieving goals and potential problems during recovery for the procedure that I have provided informed consent.    Scheduling Instructions:     Procedure: Therapeutic left lumbar sympathetic block     Indication/Reason: Left lower extremity complex regional pain syndrome     Note: Always confirm laterality of pain with [name], before procedure.  . Provide equipment / supplies at bedside    Equipment required: Single use, disposable, "Block Tray"    Standing Status:   Standing    Number of Occurrences:   1    Order Specific Question:   Specify    Answer:   Block Tray  . Miscellanous precautions    Standing Status:   Standing    Number of Occurrences:   1  . Miscellanous precautions    NOTE: Although It is true that patients can have allergies to shellfish and that shellfish contain iodine, most shellfish  allergies are due to two protein allergens present in the shellfish: tropomyosins and parvalbumin. Not all patients with shellfish allergies are allergic to iodine. However, as a precaution, avoid using iodine containing products.    Standing Status:   Standing    Number of Occurrences:   1   Chronic Opioid Analgesic:  None provided by our practice. NO OPIOIDS: UDS (08/07/17) (+) for Unreported Benzoylecgonine, a metabolite of cocaine; its presence    indicates use of this drug.  Source is most commonly illicit.   Medications ordered for procedure: Meds ordered this encounter  Medications  . lidocaine (XYLOCAINE) 2 % (with pres) injection 400 mg  . lactated ringers infusion 1,000 mL  . midazolam (VERSED) 5 MG/5ML injection 1-2 mg  Make sure Flumazenil is available in the pyxis when using this medication. If oversedation occurs,  administer 0.2 mg IV over 15 sec. If after 45 sec no response, administer 0.2 mg again over 1 min; may repeat at 1 min intervals; not to exceed 4 doses (1 mg)  . fentaNYL (SUBLIMAZE) injection 25-50 mcg    Make sure Narcan is available in the pyxis when using this medication. In the event of respiratory depression (RR< 8/min): Titrate NARCAN (naloxone) in increments of 0.1 to 0.2 mg IV at 2-3 minute intervals, until desired degree of reversal.  . dexamethasone (DECADRON) injection 10 mg  . bupivacaine-epinephrine (MARCAINE W/ EPI) 0.25% -1:200000 injection 10 mL   Medications administered: We administered lidocaine, lactated ringers, midazolam, fentaNYL, dexamethasone, and bupivacaine-epinephrine.  See the medical record for exact dosing, route, and time of administration.  Follow-up plan:   Return in about 2 weeks (around 01/05/2020) for PP-eval + Procedure (w/ sedation): (L) LSB #4.       Interventional treatment options:  Under consideration:   NOTE: (IODINE) SHELLFISH & CONTRAST ALLERGY!! (ANAPHYLACTIC)  Diagnostic midline LESI  Possible bilateral thoracic RFA  Diagnostic left IA knee injection    Therapeutic/palliative (PRN):   Diagnostic bilateral cervical facet block #2  Diagnostic bilateral Thoracic T7, T8, T9, & T10 Facet Medial Branch block #2 (80/100/100)  Diagnostic/therapeutic left CESI #2 (100/100/100)  Palliative bilateral lumbar facet block #3  Palliative left lumbar facet RFA #2 (last done 05/19/2019)  Palliative right lumbar facet RFA #1 (last done 06/30/2019)      Recent Visits Date Type Provider Dept  12/10/19 Telemedicine Milinda Pointer, MD [facility]  11/26/19 Procedure visit Milinda Pointer, MD [facility]  11/17/19 Telemedicine Milinda Pointer, MD [facility]  11/03/19 Procedure visit Milinda Pointer, MD [facility]  10/27/19 Office Visit Milinda Pointer, MD [facility]  Showing recent visits  within past 90 days and meeting all other requirements Today's Visits Date Type Provider Dept  12/22/19 Procedure visit Milinda Pointer, MD [facility]  Showing today's visits and meeting all other requirements Future Appointments Date Type Provider Dept  01/05/20 Appointment Milinda Pointer, MD [facility]  Showing future appointments within next 90 days and meeting all other requirements  Disposition: Discharge home  Discharge (Date  Time): 12/22/2019; 1020 hrs.   Primary Care Physician: Tracie Harrier, MD Location: [facility] Note by: Gaspar Cola, MD Date: 12/22/2019; Time: 10:03 AM  Disclaimer:  Medicine is not an Chief Strategy Officer. The only guarantee in medicine is that nothing is guaranteed. It is important to note that the decision to proceed with this intervention was based on the information collected from the patient. The Data and conclusions were drawn from the patient's questionnaire, the interview, and the physical examination. Because the information was provided in large part by the patient, it cannot be guaranteed that it has not been purposely or unconsciously manipulated. Every effort has been made to obtain as much relevant data as possible for this evaluation. It is important to note that the conclusions that lead to this procedure are derived in large part from the available data. Always take into account that the treatment will also be dependent on availability of resources and existing treatment guidelines, considered by other Pain Management Practitioners as being common knowledge and practice, at the time of the intervention. For Medico-Legal purposes, it is also important to point out that variation in procedural techniques and pharmacological choices are  the acceptable norm. The indications, contraindications, technique, and results of the above procedure should only be interpreted and judged by a  Board-Certified Interventional Pain Specialist with extensive familiarity and expertise in the same exact procedure and technique.

## 2019-12-22 NOTE — Progress Notes (Signed)
Safety precautions to be maintained throughout the outpatient stay will include: orient to surroundings, keep bed in low position, maintain call bell within reach at all times, provide assistance with transfer out of bed and ambulation.  

## 2019-12-22 NOTE — Patient Instructions (Addendum)
____________________________________________________________________________________________  Post-Procedure Discharge Instructions  Instructions:  Apply ice:   Purpose: This will minimize any swelling and discomfort after procedure.   When: Day of procedure, as soon as you get home.  How: Fill a plastic sandwich bag with crushed ice. Cover it with a small towel and apply to injection site.  How long: (15 min on, 15 min off) Apply for 15 minutes then remove x 15 minutes.  Repeat sequence on day of procedure, until you go to bed.  Apply heat:   Purpose: To treat any soreness and discomfort from the procedure.  When: Starting the next day after the procedure.  How: Apply heat to procedure site starting the day following the procedure.  How long: May continue to repeat daily, until discomfort goes away.  Food intake: Start with clear liquids (like water) and advance to regular food, as tolerated.   Physical activities: Keep activities to a minimum for the first 8 hours after the procedure. After that, then as tolerated.  Driving: If you have received any sedation, be responsible and do not drive. You are not allowed to drive for 24 hours after having sedation.  Blood thinner: (Applies only to those taking blood thinners) You may restart your blood thinner 6 hours after your procedure.  Insulin: (Applies only to Diabetic patients taking insulin) As soon as you can eat, you may resume your normal dosing schedule.  Infection prevention: Keep procedure site clean and dry. Shower daily and clean area with soap and water.  Post-procedure Pain Diary: Extremely important that this be done correctly and accurately. Recorded information will be used to determine the next step in treatment. For the purpose of accuracy, follow these rules:  Evaluate only the area treated. Do not report or include pain from an untreated area. For the purpose of this evaluation, ignore all other areas of pain,  except for the treated area.  After your procedure, avoid taking a long nap and attempting to complete the pain diary after you wake up. Instead, set your alarm clock to go off every hour, on the hour, for the initial 8 hours after the procedure. Document the duration of the numbing medicine, and the relief you are getting from it.  Do not go to sleep and attempt to complete it later. It will not be accurate. If you received sedation, it is likely that you were given a medication that may cause amnesia. Because of this, completing the diary at a later time may cause the information to be inaccurate. This information is needed to plan your care.  Follow-up appointment: Keep your post-procedure follow-up evaluation appointment after the procedure (usually 2 weeks for most procedures, 6 weeks for radiofrequencies). DO NOT FORGET to bring you pain diary with you.   Expect: (What should I expect to see with my procedure?)  From numbing medicine (AKA: Local Anesthetics): Numbness or decrease in pain. You may also experience some weakness, which if present, could last for the duration of the local anesthetic.  Onset: Full effect within 15 minutes of injected.  Duration: It will depend on the type of local anesthetic used. On the average, 1 to 8 hours.   From steroids (Applies only if steroids were used): Decrease in swelling or inflammation. Once inflammation is improved, relief of the pain will follow.  Onset of benefits: Depends on the amount of swelling present. The more swelling, the longer it will take for the benefits to be seen. In some cases, up to 10 days.    Duration: Steroids will stay in the system x 2 weeks. Duration of benefits will depend on multiple posibilities including persistent irritating factors.  Side-effects: If present, they may typically last 2 weeks (the duration of the steroids).  Frequent: Cramps (if they occur, drink Gatorade and take over-the-counter Magnesium 450-500 mg  once to twice a day); water retention with temporary weight gain; increases in blood sugar; decreased immune system response; increased appetite.  Occasional: Facial flushing (red, warm cheeks); mood swings; menstrual changes.  Uncommon: Long-term decrease or suppression of natural hormones; bone thinning. (These are more common with higher doses or more frequent use. This is why we prefer that our patients avoid having any injection therapies in other practices.)   Very Rare: Severe mood changes; psychosis; aseptic necrosis.  From procedure: Some discomfort is to be expected once the numbing medicine wears off. This should be minimal if ice and heat are applied as instructed.  Call if: (When should I call?)  You experience numbness and weakness that gets worse with time, as opposed to wearing off.  New onset bowel or bladder incontinence. (Applies only to procedures done in the spine)  Emergency Numbers:  Durning business hours (Monday - Thursday, 8:00 AM - 4:00 PM) (Friday, 9:00 AM - 12:00 Noon): [phone]  After hours: [phone]  NOTE: If you are having a problem and are unable connect with, or to talk to a provider, then go to your nearest urgent care or emergency department. If the problem is serious and urgent, please call 911. ____________________________________________________________________________________________   ____________________________________________________________________________________________  Preparing for Procedure with Sedation  Procedure appointments are limited to planned procedures: . No Prescription Refills. . No disability issues will be discussed. . No medication changes will be discussed.  Instructions: . Oral Intake: Do not eat or drink anything for at least 8 hours prior to your procedure. (Exception: Blood Pressure Medication. See below.) . Transportation: Unless otherwise stated by your physician, you may drive yourself after the  procedure. . Blood Pressure Medicine: Do not forget to take your blood pressure medicine with a sip of water the morning of the procedure. If your Diastolic (lower reading)is above 100 mmHg, elective cases will be cancelled/rescheduled. . Blood thinners: These will need to be stopped for procedures. Notify our staff if you are taking any blood thinners. Depending on which one you take, there will be specific instructions on how and when to stop it. . Diabetics on insulin: Notify the staff so that you can be scheduled 1st case in the morning. If your diabetes requires high dose insulin, take only  of your normal insulin dose the morning of the procedure and notify the staff that you have done so. . Preventing infections: Shower with an antibacterial soap the morning of your procedure. . Build-up your immune system: Take 1000 mg of Vitamin C with every meal (3 times a day) the day prior to your procedure. . Antibiotics: Inform the staff if you have a condition or reason that requires you to take antibiotics before dental procedures. . Pregnancy: If you are pregnant, call and cancel the procedure. . Sickness: If you have a cold, fever, or any active infections, call and cancel the procedure. . Arrival: You must be in the facility at least 30 minutes prior to your scheduled procedure. . Children: Do not bring children with you. . Dress appropriately: Bring dark clothing that you would not mind if they get stained. . Valuables: Do not bring any jewelry or valuables.    Reasons to call and reschedule or cancel your procedure: (Following these recommendations will minimize the risk of a serious complication.) . Surgeries: Avoid having procedures within 2 weeks of any surgery. (Avoid for 2 weeks before or after any surgery). . Flu Shots: Avoid having procedures within 2 weeks of a flu shots or . (Avoid for 2 weeks before or after immunizations). . Barium: Avoid having a procedure within 7-10 days after  having had a radiological study involving the use of radiological contrast. (Myelograms, Barium swallow or enema study). . Heart attacks: Avoid any elective procedures or surgeries for the initial 6 months after a "Myocardial Infarction" (Heart Attack). . Blood thinners: It is imperative that you stop these medications before procedures. Let us know if you if you take any blood thinner.  . Infection: Avoid procedures during or within two weeks of an infection (including chest colds or gastrointestinal problems). Symptoms associated with infections include: Localized redness, fever, chills, night sweats or profuse sweating, burning sensation when voiding, cough, congestion, stuffiness, runny nose, sore throat, diarrhea, nausea, vomiting, cold or Flu symptoms, recent or current infections. It is specially important if the infection is over the area that we intend to treat. Marland Kitchen Heart and lung problems: Symptoms that may suggest an active cardiopulmonary problem include: cough, chest pain, breathing difficulties or shortness of breath, dizziness, ankle swelling, uncontrolled high or unusually low blood pressure, and/or palpitations. If you are experiencing any of these symptoms, cancel your procedure and contact your primary care physician for an evaluation.  Remember:  Regular Business hours are:  Monday to Thursday 8:00 AM to 4:00 PM  Provider's Schedule: Milinda Pointer, MD:  Procedure days: Tuesday and Thursday 7:30 AM to 4:00 PM  Gillis Santa, MD:  Procedure days: Monday and Wednesday 7:30 AM to 4:00 PM ____________________________________________________________________________________________   Pain Management Discharge Instructions  General Discharge Instructions :  If you need to reach your doctor call: Monday-Friday 8:00 am - 4:00 pm at [phone] or toll free [phone].  After clinic hours [phone] to have operator reach doctor.  Bring all of your medication bottles to all  your appointments in the pain clinic.  To cancel or reschedule your appointment with Pain Management please remember to call 24 hours in advance to avoid a fee.  Refer to the educational materials which you have been given on: General Risks, I had my Procedure. Discharge Instructions, Post Sedation.  Post Procedure Instructions:  The drugs you were given will stay in your system until tomorrow, so for the next 24 hours you should not drive, make any legal decisions or drink any alcoholic beverages.  You may eat anything you prefer, but it is better to start with liquids then soups and crackers, and gradually work up to solid foods.  Please notify your doctor immediately if you have any unusual bleeding, trouble breathing or pain that is not related to your normal pain.  Depending on the type of procedure that was done, some parts of your body may feel week and/or numb.  This usually clears up by tonight or the next day.  Walk with the use of an assistive device or accompanied by an adult for the 24 hours.  You may use ice on the affected area for the first 24 hours.  Put ice in a Ziploc bag and cover with a towel and place against area 15 minutes on 15 minutes off.  You may switch to heat after 24 hours.

## 2019-12-23 ENCOUNTER — Encounter: Payer: Self-pay | Admitting: Pain Medicine

## 2019-12-23 ENCOUNTER — Telehealth: Payer: Self-pay | Admitting: *Deleted

## 2019-12-23 NOTE — Telephone Encounter (Signed)
No problems post procedure. 

## 2020-01-05 ENCOUNTER — Encounter: Payer: Self-pay | Admitting: Pain Medicine

## 2020-01-05 ENCOUNTER — Telehealth: Payer: Self-pay | Admitting: *Deleted

## 2020-01-05 ENCOUNTER — Ambulatory Visit
Admission: RE | Admit: 2020-01-05 | Discharge: 2020-01-05 | Disposition: A | Discharge status: Home / Self Care | Payer: Medicare Other | Source: Ambulatory Visit | Attending: Pain Medicine | Admitting: Pain Medicine

## 2020-01-05 ENCOUNTER — Other Ambulatory Visit: Payer: Self-pay

## 2020-01-05 ENCOUNTER — Ambulatory Visit (HOSPITAL_BASED_OUTPATIENT_CLINIC_OR_DEPARTMENT_OTHER): Payer: Medicare Other | Admitting: Pain Medicine

## 2020-01-05 VITALS — BP 107/94 | HR 93 | Temp 97.4°F | Resp 16 | Ht 67.0 in | Wt 218.0 lb

## 2020-01-05 DIAGNOSIS — G90522 Complex regional pain syndrome I of left lower limb: Secondary | ICD-10-CM

## 2020-01-05 DIAGNOSIS — M79604 Pain in right leg: Secondary | ICD-10-CM | POA: Diagnosis present

## 2020-01-05 DIAGNOSIS — G8929 Other chronic pain: Secondary | ICD-10-CM

## 2020-01-05 DIAGNOSIS — M79605 Pain in left leg: Secondary | ICD-10-CM | POA: Insufficient documentation

## 2020-01-05 MED ORDER — FENTANYL CITRATE (PF) 100 MCG/2ML IJ SOLN
25.0000 ug | INTRAMUSCULAR | Status: DC | PRN
  Administered 2020-01-05: 100 ug via INTRAVENOUS
  Filled 2020-01-05: qty 2

## 2020-01-05 MED ORDER — LIDOCAINE HCL 2 % IJ SOLN
20.0000 mL | Freq: Once | INTRAMUSCULAR | Status: AC
  Administered 2020-01-05: 400 mg

## 2020-01-05 MED ORDER — MIDAZOLAM HCL 5 MG/5ML IJ SOLN
1.0000 mg | INTRAMUSCULAR | Status: DC | PRN
  Administered 2020-01-05: 3 mg via INTRAVENOUS
  Filled 2020-01-05: qty 5

## 2020-01-05 MED ORDER — LACTATED RINGERS IV SOLN
1000.0000 mL | Freq: Once | INTRAVENOUS | Status: AC
  Administered 2020-01-05: 1000 mL via INTRAVENOUS

## 2020-01-05 MED ORDER — BUPIVACAINE-EPINEPHRINE (PF) 0.25% -1:200000 IJ SOLN
10.0000 mL | Freq: Once | INTRAMUSCULAR | Status: AC
  Administered 2020-01-05: 9 mL
  Filled 2020-01-05: qty 30

## 2020-01-05 MED ORDER — DIPHENHYDRAMINE HCL 50 MG/ML IJ SOLN
12.5000 mg | Freq: Once | INTRAMUSCULAR | Status: AC
  Administered 2020-01-05: 12.5 mg via INTRAVENOUS
  Filled 2020-01-05: qty 1

## 2020-01-05 MED ORDER — DEXAMETHASONE SODIUM PHOSPHATE 10 MG/ML IJ SOLN
10.0000 mg | Freq: Once | INTRAMUSCULAR | Status: AC
  Administered 2020-01-05: 10 mg
  Filled 2020-01-05: qty 1

## 2020-01-05 NOTE — Patient Instructions (Addendum)

## 2020-01-05 NOTE — Progress Notes (Signed)
PROVIDER NOTE: Information contained herein reflects review and annotations entered in association with encounter. Interpretation of such information and data should be left to medically-trained personnel. Information provided to patient can be located elsewhere in the medical record under "Patient Instructions". Document created using STT-dictation technology, any transcriptional errors that may result from process are unintentional.    Patient: Samantha Macias  Service Category: Procedure  Provider: Gaspar Cola, MD  DOB: 1960-06-12  DOS: 01/05/2020  Location: [facility]  MRN: [id number]  Setting: Ambulatory - outpatient  Referring Provider: Tracie Harrier, MD  Type: Established Patient  Specialty: Interventional Pain Management  PCP: Tracie Harrier, MD   Primary Reason for Visit: Interventional Pain Management Treatment. CC: Foot Pain (left)  Procedure:          Anesthesia, Analgesia, Anxiolysis:  Type: Therapeutic Lumbar Sympathetic Block #3  Region:Lumbosacral Level: L3, L4 Laterality: Left Paravertebral  Type: Moderate (Conscious) Sedation combined with Local Anesthesia Indication(s): Analgesia and Anxiety Route: Intravenous (IV) IV Access: Secured Sedation: Meaningful verbal contact was maintained at all times during the procedure  Local Anesthetic: Lidocaine 1-2%  Position: Prone with head of the table was raised to facilitate breathing.   Indications: 1. Complex regional pain syndrome type 1 of left lower extremity   2. Chronic lower extremity pain (Secondary Area of Pain) (Bilateral) (L>R)    Pain Score: Pre-procedure: 8 /10 Post-procedure: 3 /10   Note: The initial temperature on the left foot was 90.5 F and at the time of discharge it was 84.4 F.  This is the treated side.  In the case of the control (the right side), the initial temperature was 90 F and the discharge temperature was 91 F.  Pre-op Assessment:  Ms. Godbee is a 59 y.o.  (year old), female patient, seen today for interventional treatment. She  has a past surgical history that includes Tubal ligation; Nasal sinus surgery; Colonoscopy with propofol (N/A, 04/01/2017); Esophagogastroduodenoscopy (egd) with propofol (N/A, 04/01/2017); and Mouth surgery. Ms. Doering has a current medication list which includes the following prescription(s): albuterol, bupropion, diurex, vitamin d, diclofenac sodium, tylenol pm extra strength, donepezil, hydrocodone-acetaminophen, losartan-hydrochlorothiazide, meloxicam, multiple vitamins-minerals, pantoprazole, pregabalin, rybelsus, sertraline, spiriva handihaler, temazepam, tizanidine, and anoro ellipta, and the following Facility-Administered Medications: fentanyl and midazolam. Her primarily concern today is the Foot Pain (left)  Initial Vital Signs:  Pulse/HCG Rate: 93ECG Heart Rate: 79 Temp: 97.7 F (36.5 C) Resp: 16 BP: 102/70 SpO2: 98 %  BMI: Estimated body mass index is 34.14 kg/m as calculated from the following:   Height as of this encounter: 5\' 7"  (1.702 m).   Weight as of this encounter: 218 lb (98.9 kg).  Risk Assessment: Allergies: Reviewed. She is allergic to contrast media [iodinated diagnostic agents], shellfish allergy, tetanus toxoid adsorbed, ace inhibitors, codeine, nitrofurantoin monohyd macro, and red dye.  Allergy Precautions: None required Coagulopathies: Reviewed. None identified.  Blood-thinner therapy: None at this time Active Infection(s): Reviewed. None identified. Ms. Authier is afebrile  Site Confirmation: Ms. Smart was asked to confirm the procedure and laterality before marking the site Procedure checklist: Completed Consent: Before the procedure and under the influence of no sedative(s), amnesic(s), or anxiolytics, the patient was informed of the treatment options, risks and possible complications. To fulfill our ethical and legal obligations, as recommended by the American Medical Association's  Code of Ethics, I have informed the patient of my clinical impression; the nature and purpose of the treatment or procedure; the risks, benefits, and possible complications  of the intervention; the alternatives, including doing nothing; the risk(s) and benefit(s) of the alternative treatment(s) or procedure(s); and the risk(s) and benefit(s) of doing nothing. The patient was provided information about the general risks and possible complications associated with the procedure. These may include, but are not limited to: failure to achieve desired goals, infection, bleeding, organ or nerve damage, allergic reactions, paralysis, and death. In addition, the patient was informed of those risks and complications associated to Spine-related procedures, such as failure to decrease pain; infection (i.e.: Meningitis, epidural or intraspinal abscess); bleeding (i.e.: epidural hematoma, subarachnoid hemorrhage, or any other type of intraspinal or peri-dural bleeding); organ or nerve damage (i.e.: Any type of peripheral nerve, nerve root, or spinal cord injury) with subsequent damage to sensory, motor, and/or autonomic systems, resulting in permanent pain, numbness, and/or weakness of one or several areas of the body; allergic reactions; (i.e.: anaphylactic reaction); and/or death. Furthermore, the patient was informed of those risks and complications associated with the medications. These include, but are not limited to: allergic reactions (i.e.: anaphylactic or anaphylactoid reaction(s)); adrenal axis suppression; blood sugar elevation that in diabetics may result in ketoacidosis or comma; water retention that in patients with history of congestive heart failure may result in shortness of breath, pulmonary edema, and decompensation with resultant heart failure; weight gain; swelling or edema; medication-induced neural toxicity; particulate matter embolism and blood vessel occlusion with resultant organ, and/or nervous system  infarction; and/or aseptic necrosis of one or more joints. Finally, the patient was informed that Medicine is not an exact science; therefore, there is also the possibility of unforeseen or unpredictable risks and/or possible complications that may result in a catastrophic outcome. The patient indicated having understood very clearly. We have given the patient no guarantees and we have made no promises. Enough time was given to the patient to ask questions, all of which were answered to the patient's satisfaction. Ms. Knoop has indicated that she wanted to continue with the procedure. Attestation: I, the ordering provider, attest that I have discussed with the patient the benefits, risks, side-effects, alternatives, likelihood of achieving goals, and potential problems during recovery for the procedure that I have provided informed consent. Date  Time: 01/05/2020  9:01 AM  Pre-Procedure Preparation:  Monitoring: As per clinic protocol. Respiration, ETCO2, SpO2, BP, heart rate and rhythm monitor placed and checked for adequate function Safety Precautions: Patient was assessed for positional comfort and pressure points before starting the procedure. Time-out: I initiated and conducted the "Time-out" before starting the procedure, as per protocol. The patient was asked to participate by confirming the accuracy of the "Time Out" information. Verification of the correct person, site, and procedure were performed and confirmed by me, the nursing staff, and the patient. "Time-out" conducted as per Joint Commission's Universal Protocol (UP.01.01.01). Time: 8416  Description of Procedure:          Target Area: For Lumbar Sympathetic Block(s), the target is the anterolateral aspect of the L3 & L4 vertebral bodies, where the lumbar sympathetic chain resides. Approach: Paravertebral, ipsilateral approach. Area Prepped: Entire Posterior Thoracolumbar Region DuraPrep (Iodine Povacrylex [0.7% available iodine] and  Isopropyl Alcohol, 74% w/w) Safety Precautions: Aspiration looking for blood return was conducted prior to all injections. At no point did we inject any substances, as a needle was being advanced. No attempts were made at seeking any paresthesias. Safe injection practices and needle disposal techniques used. Medications properly checked for expiration dates. SDV (single dose vial) medications used. Description of the Procedure:  Protocol guidelines were followed. The patient was placed in position over the procedure table. The target area was identified and the area prepped in the usual manner. Skin & deeper tissues infiltrated with local anesthetic. Appropriate amount of time allowed to pass for local anesthetics to take effect. The procedure needles were then advanced to the target area, the superior anterolateral border of the L3 vertebral body, under pulsed fluoroscopic guidance. Care was taken not to advance the tip of the needle past the anterior border of the vertebral body, on the lateral fluoroscopic view. Proper needle placement secured. Negative aspiration confirmed. Solution injected in intermittent fashion, asking for systemic symptoms every 0.5cc of injectate. The needles were then removed and the area cleansed, making sure to leave some of the prepping solution back to take advantage of its long term bactericidal properties. Vitals:   01/05/20 1001 01/05/20 1010 01/05/20 1020 01/05/20 1030  BP: 98/69 94/62 96/78  (!) 107/94  Pulse:      Resp: 16 16 16 16   Temp:  (!) 97.1 F (36.2 C)  (!) 97.4 F (36.3 C)  SpO2: 95% 93% 95% 98%  Weight:      Height:        Start Time: 0953 hrs. End Time: 1003 hrs. Materials:  Needle(s) Type: Spinal Needle Gauge: 22G Length: 7-in Medication(s): Please see orders for medications and dosing details.  Imaging Guidance (Spinal):          Type of Imaging Technique: Fluoroscopy Guidance (Spinal) Indication(s): Assistance in needle guidance and placement  for procedures requiring needle placement in or near specific anatomical locations not easily accessible without such assistance. Exposure Time: Please see nurses notes. Contrast: Before injecting any contrast, we confirmed that the patient did not have an allergy to iodine, shellfish, or radiological contrast. Once satisfactory needle placement was completed at the desired level, radiological contrast was injected. Contrast injected under live fluoroscopy. No contrast complications. See chart for type and volume of contrast used. Fluoroscopic Guidance: I was personally present during the use of fluoroscopy. "Tunnel Vision Technique" used to obtain the best possible view of the target area. Parallax error corrected before commencing the procedure. "Direction-depth-direction" technique used to introduce the needle under continuous pulsed fluoroscopy. Once target was reached, antero-posterior, oblique, and lateral fluoroscopic projection used confirm needle placement in all planes. Images permanently stored in EMR. Interpretation: I personally interpreted the imaging intraoperatively. Adequate needle placement confirmed in multiple planes. Appropriate spread of contrast into desired area was observed. No evidence of afferent or efferent intravascular uptake. No intrathecal or subarachnoid spread observed. Permanent images saved into the patient's record.  Antibiotic Prophylaxis:   Anti-infectives (From admission, onward)   None     Indication(s): None identified  Post-operative Assessment:  Post-procedure Vital Signs:  Pulse/HCG Rate: 9378 Temp:  (right foot 91.0   left foot 88.4) Resp: 16 BP: (!) 107/94 SpO2: 98 %  EBL: None  Complications: No immediate post-treatment complications observed by team, or reported by patient.  Note: The patient tolerated the entire procedure well. A repeat set of vitals were taken after the procedure and the patient was kept under observation following  institutional policy, for this type of procedure. Post-procedural neurological assessment was performed, showing return to baseline, prior to discharge. The patient was provided with post-procedure discharge instructions, including a section on how to identify potential problems. Should any problems arise concerning this procedure, the patient was given instructions to immediately contact us, at any time, without hesitation. In any case, we  plan to contact the patient by telephone for a follow-up status report regarding this interventional procedure.  Comments:  No additional relevant information.  Plan of Care  Orders:  Orders Placed This Encounter  Procedures  . LUMBAR SYMPATHETIC BLOCK    For sympathetically-mediated lower extremity pain.    Scheduling Instructions:     Purpose: Therapeutic     Laterality: Left-sided     Level(s): Lumbar sympathetic chain (L3, L4)     Sedation: Sedation recommended.     Scheduling Timeframe: Today    Order Specific Question:   Where will this procedure be performed?    Answer:   [facility]  . DG PAIN [facility] C-ARM 1-60 MIN NO REPORT    Intraoperative interpretation by procedural physician at [place].    Standing Status:   Standing    Number of Occurrences:   1    Order Specific Question:   Reason for exam:    Answer:   Assistance in needle guidance and placement for procedures requiring needle placement in or near specific anatomical locations not easily accessible without such assistance.  . Informed Consent Details: Physician/Practitioner Attestation; Transcribe to consent form and obtain patient signature    Provider Attestation: I, [place] Dossie Arbour, MD, (Pain Management Specialist), the physician/practitioner, attest that I have discussed with the patient the benefits, risks, side effects, alternatives, likelihood of achieving goals and potential problems during recovery for the procedure that I have provided informed  consent.    Scheduling Instructions:     Procedure: Left lumbar sympathetic block     Indication/Reason: Left lower extremity complex regional pain syndrome     Note: Always confirm laterality of pain with [name], before procedure.  . Provide equipment / supplies at bedside    Equipment required: Single use, disposable, "Block Tray"    Standing Status:   Standing    Number of Occurrences:   1    Order Specific Question:   Specify    Answer:   Block Tray   Chronic Opioid Analgesic:  None provided by our practice. NO OPIOIDS: UDS (08/07/17) (+) for Unreported Benzoylecgonine, a metabolite of cocaine; its presence    indicates use of this drug.  Source is most commonly illicit.   Medications ordered for procedure: Meds ordered this encounter  Medications  . lidocaine (XYLOCAINE) 2 % (with pres) injection 400 mg  . lactated ringers infusion 1,000 mL  . midazolam (VERSED) 5 MG/5ML injection 1-2 mg    Make sure Flumazenil is available in the pyxis when using this medication. If oversedation occurs, administer 0.2 mg IV over 15 sec. If after 45 sec no response, administer 0.2 mg again over 1 min; may repeat at 1 min intervals; not to exceed 4 doses (1 mg)  . fentaNYL (SUBLIMAZE) injection 25-50 mcg    Make sure Narcan is available in the pyxis when using this medication. In the event of respiratory depression (RR< 8/min): Titrate NARCAN (naloxone) in increments of 0.1 to 0.2 mg IV at 2-3 minute intervals, until desired degree of reversal.  . diphenhydrAMINE (BENADRYL) injection 12.5 mg  . dexamethasone (DECADRON) injection 10 mg  . bupivacaine-epinephrine (MARCAINE W/ EPI) 0.25% -1:200000 injection 10 mL   Medications administered: We administered lidocaine, lactated ringers, midazolam, fentaNYL, diphenhydrAMINE, dexamethasone, and bupivacaine-epinephrine.  See the medical record for exact dosing, route, and time of administration.  Follow-up plan:   Return in about 2 weeks (around  01/19/2020) for VV(26min), PP-(on procedure day).  Interventional treatment options:  Under consideration:   NOTE: (IODINE) SHELLFISH & CONTRAST ALLERGY!! (ANAPHYLACTIC)  Diagnostic midline LESI  Possible bilateral thoracic RFA  Diagnostic left IA knee injection    Therapeutic/palliative (PRN):   Diagnostic bilateral cervical facet block #2  Diagnostic bilateral Thoracic T7, T8, T9, & T10 Facet Medial Branch block #2 (80/100/100)  Diagnostic/therapeutic left CESI #2 (100/100/100)  Palliative bilateral lumbar facet block #3  Palliative left lumbar facet RFA #2 (last done 05/19/2019)  Palliative right lumbar facet RFA #1 (last done 06/30/2019)       Recent Visits Date Type Provider Dept  12/22/19 Procedure visit Milinda Pointer, MD [facility]  12/10/19 Telemedicine Milinda Pointer, MD [facility]  11/26/19 Procedure visit Milinda Pointer, MD [facility]  11/17/19 Telemedicine Milinda Pointer, MD [facility]  11/03/19 Procedure visit Milinda Pointer, MD [facility]  10/27/19 Office Visit Milinda Pointer, MD [facility]  Showing recent visits within past 90 days and meeting all other requirements Today's Visits Date Type Provider Dept  01/05/20 Procedure visit Milinda Pointer, MD [facility]  Showing today's visits and meeting all other requirements Future Appointments Date Type Provider Dept  01/27/20 Appointment Milinda Pointer, MD [facility]  Showing future appointments within next 90 days and meeting all other requirements  Disposition: Discharge home  Discharge (Date  Time): 01/05/2020; 1033 hrs.   Primary Care Physician: Tracie Harrier, MD Location: [facility] Note by: Gaspar Cola, MD Date: 01/05/2020; Time: 11:48 AM  Disclaimer:  Medicine is not an Chief Strategy Officer. The only guarantee in medicine is that nothing is guaranteed.  It is important to note that the decision to proceed with this intervention was based on the information collected from the patient. The Data and conclusions were drawn from the patient's questionnaire, the interview, and the physical examination. Because the information was provided in large part by the patient, it cannot be guaranteed that it has not been purposely or unconsciously manipulated. Every effort has been made to obtain as much relevant data as possible for this evaluation. It is important to note that the conclusions that lead to this procedure are derived in large part from the available data. Always take into account that the treatment will also be dependent on availability of resources and existing treatment guidelines, considered by other Pain Management Practitioners as being common knowledge and practice, at the time of the intervention. For Medico-Legal purposes, it is also important to point out that variation in procedural techniques and pharmacological choices are the acceptable norm. The indications, contraindications, technique, and results of the above procedure should only be interpreted and judged by a Board-Certified Interventional Pain Specialist with extensive familiarity and expertise in the same exact procedure and technique.

## 2020-01-06 ENCOUNTER — Telehealth: Payer: Self-pay

## 2020-01-06 NOTE — Telephone Encounter (Signed)
Called patient PP and she states she is doing OK . Instructed to call if needed.

## 2020-01-19 ENCOUNTER — Ambulatory Visit: Payer: Medicare Other | Admitting: Pain Medicine

## 2020-01-19 NOTE — Progress Notes (Deleted)
Canceled this morning by patient due to transportation.  No-show.

## 2020-01-20 ENCOUNTER — Telehealth: Payer: Medicare Other | Admitting: Psychiatry

## 2020-01-20 ENCOUNTER — Other Ambulatory Visit: Payer: Self-pay

## 2020-01-27 ENCOUNTER — Telehealth: Payer: Medicare Other | Admitting: Pain Medicine

## 2020-02-01 ENCOUNTER — Other Ambulatory Visit (HOSPITAL_COMMUNITY): Payer: Self-pay | Admitting: Psychiatry

## 2020-02-01 NOTE — Telephone Encounter (Signed)
Will fill, she was seen in Sat clinic, was supposed to have f/u in [place] office, please tell them

## 2020-02-02 ENCOUNTER — Ambulatory Visit: Payer: Medicare Other | Admitting: Pain Medicine

## 2020-02-09 ENCOUNTER — Ambulatory Visit: Payer: Medicare Other | Admitting: Pain Medicine

## 2020-02-16 ENCOUNTER — Ambulatory Visit: Payer: Medicare Other | Admitting: Pain Medicine

## 2020-02-16 ENCOUNTER — Ambulatory Visit (HOSPITAL_BASED_OUTPATIENT_CLINIC_OR_DEPARTMENT_OTHER): Payer: Medicare Other | Admitting: Pain Medicine

## 2020-02-16 ENCOUNTER — Encounter: Payer: Self-pay | Admitting: Pain Medicine

## 2020-02-16 ENCOUNTER — Other Ambulatory Visit: Payer: Self-pay

## 2020-02-16 ENCOUNTER — Ambulatory Visit
Admission: RE | Admit: 2020-02-16 | Discharge: 2020-02-16 | Disposition: A | Discharge status: Home / Self Care | Payer: Medicare Other | Source: Ambulatory Visit | Attending: Pain Medicine | Admitting: Pain Medicine

## 2020-02-16 VITALS — BP 101/84 | HR 96 | Temp 97.2°F | Resp 16 | Ht 67.0 in | Wt 208.0 lb

## 2020-02-16 DIAGNOSIS — G90522 Complex regional pain syndrome I of left lower limb: Secondary | ICD-10-CM

## 2020-02-16 DIAGNOSIS — M79605 Pain in left leg: Secondary | ICD-10-CM | POA: Diagnosis present

## 2020-02-16 DIAGNOSIS — M5136 Other intervertebral disc degeneration, lumbar region: Secondary | ICD-10-CM | POA: Diagnosis present

## 2020-02-16 DIAGNOSIS — G8929 Other chronic pain: Secondary | ICD-10-CM

## 2020-02-16 DIAGNOSIS — Z91041 Radiographic dye allergy status: Secondary | ICD-10-CM | POA: Diagnosis present

## 2020-02-16 DIAGNOSIS — Z87892 Personal history of anaphylaxis: Secondary | ICD-10-CM | POA: Insufficient documentation

## 2020-02-16 DIAGNOSIS — Z91013 Allergy to seafood: Secondary | ICD-10-CM | POA: Diagnosis present

## 2020-02-16 DIAGNOSIS — M79604 Pain in right leg: Secondary | ICD-10-CM | POA: Insufficient documentation

## 2020-02-16 MED ORDER — MIDAZOLAM HCL 5 MG/5ML IJ SOLN
1.0000 mg | INTRAMUSCULAR | Status: DC | PRN
  Administered 2020-02-16: 2 mg via INTRAVENOUS
  Administered 2020-02-16: 1 mg via INTRAVENOUS
  Filled 2020-02-16: qty 5

## 2020-02-16 MED ORDER — LIDOCAINE HCL 2 % IJ SOLN
20.0000 mL | Freq: Once | INTRAMUSCULAR | Status: AC
  Administered 2020-02-16: 400 mg
  Filled 2020-02-16: qty 10

## 2020-02-16 MED ORDER — FENTANYL CITRATE (PF) 100 MCG/2ML IJ SOLN
25.0000 ug | INTRAMUSCULAR | Status: AC | PRN
  Administered 2020-02-16 (×2): 50 ug via INTRAVENOUS
  Filled 2020-02-16: qty 2

## 2020-02-16 MED ORDER — BUPIVACAINE-EPINEPHRINE (PF) 0.25% -1:200000 IJ SOLN
10.0000 mL | Freq: Once | INTRAMUSCULAR | Status: AC
  Administered 2020-02-16: 10 mL
  Filled 2020-02-16: qty 30

## 2020-02-16 MED ORDER — DEXAMETHASONE SODIUM PHOSPHATE 10 MG/ML IJ SOLN
10.0000 mg | Freq: Once | INTRAMUSCULAR | Status: AC
  Administered 2020-02-16: 10 mg
  Filled 2020-02-16: qty 1

## 2020-02-16 MED ORDER — LACTATED RINGERS IV SOLN
1000.0000 mL | Freq: Once | INTRAVENOUS | Status: AC
  Administered 2020-02-16: 1000 mL via INTRAVENOUS

## 2020-02-16 NOTE — Patient Instructions (Signed)

## 2020-02-16 NOTE — Progress Notes (Signed)
PROVIDER NOTE: Information contained herein reflects review and annotations entered in association with encounter. Interpretation of such information and data should be left to medically-trained personnel. Information provided to patient can be located elsewhere in the medical record under "Patient Instructions". Document created using STT-dictation technology, any transcriptional errors that may result from process are unintentional.    Patient: Samantha Macias  Service Category: Procedure  Provider: Gaspar Cola, MD  DOB: 01-13-1961  DOS: 02/16/2020  Location: [facility]  MRN: [id number]  Setting: Ambulatory - outpatient  Referring Provider: Tracie Harrier, MD  Type: Established Patient  Specialty: Interventional Pain Management  PCP: Tracie Harrier, MD   Primary Reason for Visit: Interventional Pain Management Treatment. CC: Foot Pain (left)  Procedure:          Anesthesia, Analgesia, Anxiolysis:  Type: Diagnostic Lumbar Sympathetic Block          Region:Lumbosacral Level: L3, L4 Laterality: Left-Sided Paravertebral  Type: Moderate (Conscious) Sedation combined with Local Anesthesia Indication(s): Analgesia and Anxiety Route: Intravenous (IV) IV Access: Secured Sedation: Meaningful verbal contact was maintained at all times during the procedure  Local Anesthetic: Lidocaine 1-2%  Position: Prone with head of the table was raised to facilitate breathing.   Indications: 1. Complex regional pain syndrome type 1 of left lower extremity   2. 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 DDD (degenerative disc disease), lumbar   4. History of allergy to radiographic contrast media   5. History of allergy to shellfish   6. History of anaphylaxis    Pain Score: Pre-procedure: 2 /10 Post-procedure: 0-No pain/10   Pre-op Assessment:  Samantha Macias is a 59 y.o. (year old), female patient, seen today for interventional treatment.  She  has a past surgical history that includes Tubal ligation; Nasal sinus surgery; Colonoscopy with propofol (N/A, 04/01/2017); Esophagogastroduodenoscopy (egd) with propofol (N/A, 04/01/2017); and Mouth surgery. Samantha Macias has a current medication list which includes the following prescription(s): albuterol, bupropion, diurex, vitamin d, diclofenac sodium, tylenol pm extra strength, donepezil, hydrocodone-acetaminophen, losartan-hydrochlorothiazide, meloxicam, multiple vitamins-minerals, pantoprazole, rybelsus, sertraline, spiriva handihaler, temazepam, tizanidine, anoro ellipta, pregabalin, and rybelsus, and the following Facility-Administered Medications: midazolam. Her primarily concern today is the Foot Pain (left)  Initial Vital Signs:  Pulse/HCG Rate: (!) 101ECG Heart Rate: (!) 102 Temp: (!) 97.4 F (36.3 C) Resp: 18 BP: 109/84 SpO2: 97 %  BMI: Estimated body mass index is 32.58 kg/m as calculated from the following:   Height as of this encounter: 5\' 7"  (1.702 m).   Weight as of this encounter: 208 lb (94.3 kg).  Risk Assessment: Allergies: Reviewed. She is allergic to contrast media [iodinated diagnostic agents], shellfish allergy, tetanus toxoid adsorbed, ace inhibitors, codeine, nitrofurantoin monohyd macro, and red dye.  Allergy Precautions: None required Coagulopathies: Reviewed. None identified.  Blood-thinner therapy: None at this time Active Infection(s): Reviewed. None identified. Samantha Macias is afebrile  Site Confirmation: Samantha Macias was asked to confirm the procedure and laterality before marking the site Procedure checklist: Completed Consent: Before the procedure and under the influence of no sedative(s), amnesic(s), or anxiolytics, the patient was informed of the treatment options, risks and possible complications. To fulfill our ethical and legal obligations, as recommended by the American Medical Association's Code of Ethics, I have informed the patient of my clinical  impression; the nature and purpose of the treatment or procedure; the risks, benefits, and possible complications of the intervention; the alternatives, including doing nothing; the risk(s)  and benefit(s) of the alternative treatment(s) or procedure(s); and the risk(s) and benefit(s) of doing nothing. The patient was provided information about the general risks and possible complications associated with the procedure. These may include, but are not limited to: failure to achieve desired goals, infection, bleeding, organ or nerve damage, allergic reactions, paralysis, and death. In addition, the patient was informed of those risks and complications associated to Spine-related procedures, such as failure to decrease pain; infection (i.e.: Meningitis, epidural or intraspinal abscess); bleeding (i.e.: epidural hematoma, subarachnoid hemorrhage, or any other type of intraspinal or peri-dural bleeding); organ or nerve damage (i.e.: Any type of peripheral nerve, nerve root, or spinal cord injury) with subsequent damage to sensory, motor, and/or autonomic systems, resulting in permanent pain, numbness, and/or weakness of one or several areas of the body; allergic reactions; (i.e.: anaphylactic reaction); and/or death. Furthermore, the patient was informed of those risks and complications associated with the medications. These include, but are not limited to: allergic reactions (i.e.: anaphylactic or anaphylactoid reaction(s)); adrenal axis suppression; blood sugar elevation that in diabetics may result in ketoacidosis or comma; water retention that in patients with history of congestive heart failure may result in shortness of breath, pulmonary edema, and decompensation with resultant heart failure; weight gain; swelling or edema; medication-induced neural toxicity; particulate matter embolism and blood vessel occlusion with resultant organ, and/or nervous system infarction; and/or aseptic necrosis of one or more  joints. Finally, the patient was informed that Medicine is not an exact science; therefore, there is also the possibility of unforeseen or unpredictable risks and/or possible complications that may result in a catastrophic outcome. The patient indicated having understood very clearly. We have given the patient no guarantees and we have made no promises. Enough time was given to the patient to ask questions, all of which were answered to the patient's satisfaction. Samantha Macias has indicated that she wanted to continue with the procedure. Attestation: I, the ordering provider, attest that I have discussed with the patient the benefits, risks, side-effects, alternatives, likelihood of achieving goals, and potential problems during recovery for the procedure that I have provided informed consent. Date  Time: 02/16/2020  8:50 AM  Pre-Procedure Preparation:  Monitoring: As per clinic protocol. Respiration, ETCO2, SpO2, BP, heart rate and rhythm monitor placed and checked for adequate function Safety Precautions: Patient was assessed for positional comfort and pressure points before starting the procedure. Time-out: I initiated and conducted the "Time-out" before starting the procedure, as per protocol. The patient was asked to participate by confirming the accuracy of the "Time Out" information. Verification of the correct person, site, and procedure were performed and confirmed by me, the nursing staff, and the patient. "Time-out" conducted as per Joint Commission's Universal Protocol (UP.01.01.01). Time: 0934  Description of Procedure:          Target Area: For Lumbar Sympathetic Block(s), the target is the anterolateral aspect of the L3 & L4 vertebral bodies, where the lumbar sympathetic chain resides. Approach: Paravertebral, ipsilateral approach. Area Prepped: Entire Posterior Thoracolumbar Region DuraPrep (Iodine Povacrylex [0.7% available iodine] and Isopropyl Alcohol, 74% w/w) Safety Precautions:  Aspiration looking for blood return was conducted prior to all injections. At no point did we inject any substances, as a needle was being advanced. No attempts were made at seeking any paresthesias. Safe injection practices and needle disposal techniques used. Medications properly checked for expiration dates. SDV (single dose vial) medications used. Description of the Procedure: Protocol guidelines were followed. The patient was placed in position  over the procedure table. The target area was identified and the area prepped in the usual manner. Skin & deeper tissues infiltrated with local anesthetic. Appropriate amount of time allowed to pass for local anesthetics to take effect. The procedure needles were then advanced to the target area, the superior anterolateral border of the L3 vertebral body, under pulsed fluoroscopic guidance. Care was taken not to advance the tip of the needle past the anterior border of the vertebral body, on the lateral fluoroscopic view. Proper needle placement secured. Negative aspiration confirmed. Solution injected in intermittent fashion, asking for systemic symptoms every 0.5cc of injectate. The needles were then removed and the area cleansed, making sure to leave some of the prepping solution back to take advantage of its long term bactericidal properties. Vitals:   02/16/20 0943 02/16/20 0953 02/16/20 1003 02/16/20 1013  BP: 111/80 93/82 100/81 101/84  Pulse: 96     Resp: 16 20 17 16   Temp:  (!) 97.2 F (36.2 C)  (!) 97.2 F (36.2 C)  TempSrc:      SpO2: 95% 96% 98% 98%  Weight:      Height:        Start Time: 0934 hrs. End Time: 0942 hrs. Materials:  Needle(s) Type: Spinal Needle Gauge: 22G Length: 7-in Medication(s): Please see orders for medications and dosing details.  Imaging Guidance (Spinal):          Type of Imaging Technique: Fluoroscopy Guidance (Spinal) Indication(s): Assistance in needle guidance and placement for procedures requiring needle  placement in or near specific anatomical locations not easily accessible without such assistance. Exposure Time: Please see nurses notes. Contrast: Before injecting any contrast, we confirmed that the patient did not have an allergy to iodine, shellfish, or radiological contrast. Once satisfactory needle placement was completed at the desired level, radiological contrast was injected. Contrast injected under live fluoroscopy. No contrast complications. See chart for type and volume of contrast used. Fluoroscopic Guidance: I was personally present during the use of fluoroscopy. "Tunnel Vision Technique" used to obtain the best possible view of the target area. Parallax error corrected before commencing the procedure. "Direction-depth-direction" technique used to introduce the needle under continuous pulsed fluoroscopy. Once target was reached, antero-posterior, oblique, and lateral fluoroscopic projection used confirm needle placement in all planes. Images permanently stored in EMR. Interpretation: I personally interpreted the imaging intraoperatively. Adequate needle placement confirmed in multiple planes. Appropriate spread of contrast into desired area was observed. No evidence of afferent or efferent intravascular uptake. No intrathecal or subarachnoid spread observed. Permanent images saved into the patient's record.  Antibiotic Prophylaxis:   Anti-infectives (From admission, onward)   None     Indication(s): None identified  Post-operative Assessment:  Post-procedure Vital Signs:  Pulse/HCG Rate: 9694 Temp: (!) 97.2 F (36.2 C) Resp: 16 BP: 101/84 SpO2: 98 %  EBL: None  Complications: No immediate post-treatment complications observed by team, or reported by patient.  Note: The patient tolerated the entire procedure well. A repeat set of vitals were taken after the procedure and the patient was kept under observation following institutional policy, for this type of procedure.  Post-procedural neurological assessment was performed, showing return to baseline, prior to discharge. The patient was provided with post-procedure discharge instructions, including a section on how to identify potential problems. Should any problems arise concerning this procedure, the patient was given instructions to immediately contact us, at any time, without hesitation. In any case, we plan to contact the patient by telephone for a follow-up  status report regarding this interventional procedure.  Comments:  No additional relevant information.  Plan of Care  Orders:  Orders Placed This Encounter  Procedures  . LUMBAR SYMPATHETIC BLOCK    For sympathetically-mediated lower extremity pain.    Scheduling Instructions:     Purpose: Therapeutic     Laterality: Left-sided     Level(s): Lumbar sympathetic chain (L3, L4)     Sedation: Sedation recommended.     Scheduling Timeframe: Today    Order Specific Question:   Where will this procedure be performed?    Answer:   [facility]  . DG PA[facility] C-ARM 1-60 MIN NO REPORT    Intraoperative interpretation by procedural physician at [facility].    Standing Status:   Standing    Number of Occurrences:   1    Order Specific Question:   Reason for exam:    Answer:   Assistance in needle guidance and placement for procedures requiring needle placement in or near specific anatomical locations not easily accessible without such assistance.  . Provide equipment / supplies at bedside    "Block Tray" (Disposable  single use) Needle type: Spinal Amount/quantity: 2 Size: Medium (5-inch) Gauge: 22G    Standing Status:   Standing    Number of Occurrences:   1    Order Specific Question:   Specify    Answer:   Block Tray  . Informed Consent Details: Physician/Practitioner Attestation; Transcribe to consent form and obtain patient signature    Provider Attestation: I, [place] Dossie Arbour, MD, (Pain Management Specialist), the  physician/practitioner, attest that I have discussed with the patient the benefits, risks, side effects, alternatives, likelihood of achieving goals and potential problems during recovery for the procedure that I have provided informed consent.    Scheduling Instructions:     Procedure: Left-sided lumbar sympathetic block     Indication/Reason: Left lower extremity sympathetically mediated pain     Note: Always confirm laterality of pain with [name], before procedure.  . Miscellanous precautions    Standing Status:   Standing    Number of Occurrences:   1  . Miscellanous precautions    NOTE: Although It is true that patients can have allergies to shellfish and that shellfish contain iodine, most shellfish  allergies are due to two protein allergens present in the shellfish: tropomyosins and parvalbumin. Not all patients with shellfish allergies are allergic to iodine. However, as a precaution, avoid using iodine containing products.    Standing Status:   Standing    Number of Occurrences:   1   Chronic Opioid Analgesic:  None provided by our practice. NO OPIOIDS: UDS (08/07/17) (+) for Unreported Benzoylecgonine, a metabolite of cocaine; its presence    indicates use of this drug.  Source is most commonly illicit.   Medications ordered for procedure: Meds ordered this encounter  Medications  . lidocaine (XYLOCAINE) 2 % (with pres) injection 400 mg  . dexamethasone (DECADRON) injection 10 mg  . bupivacaine-epinephrine (MARCAINE W/ EPI) 0.25% -1:200000 injection 10 mL  . lactated ringers infusion 1,000 mL  . midazolam (VERSED) 5 MG/5ML injection 1-2 mg    Make sure Flumazenil is available in the pyxis when using this medication. If oversedation occurs, administer 0.2 mg IV over 15 sec. If after 45 sec no response, administer 0.2 mg again over 1 min; may repeat at 1 min intervals; not to exceed 4 doses (1 mg)  . fentaNYL (SUBLIMAZE) injection 25-50 mcg  Make sure Narcan is available in  the pyxis when using this medication. In the event of respiratory depression (RR< 8/min): Titrate NARCAN (naloxone) in increments of 0.1 to 0.2 mg IV at 2-3 minute intervals, until desired degree of reversal.   Medications administered: We administered lidocaine, dexamethasone, bupivacaine-epinephrine, lactated ringers, midazolam, and fentaNYL.  See the medical record for exact dosing, route, and time of administration.  Follow-up plan:   Return in about 2 weeks (around 03/01/2020) for (VV), (PP) Follow-up.       Interventional treatment options:  Under consideration:   NOTE: (IODINE) SHELLFISH & CONTRAST ALLERGY!! (ANAPHYLACTIC)  Diagnostic midline LESI  Possible bilateral thoracic RFA  Diagnostic left IA knee injection    Therapeutic/palliative (PRN):   Diagnostic bilateral cervical facet block #2  Diagnostic bilateral Thoracic T7, T8, T9, & T10 Facet Medial Branch block #2 (80/100/100)  Diagnostic/therapeutic left CESI #2 (100/100/100)  Palliative bilateral lumbar facet block #3  Palliative left lumbar facet RFA #2 (last done 05/19/2019)  Palliative right lumbar facet RFA #1 (last done 06/30/2019)        Recent Visits Date Type Provider Dept  01/05/20 Procedure visit Milinda Pointer, MD [facility]  12/22/19 Procedure visit Milinda Pointer, MD [facility]  12/10/19 Telemedicine Milinda Pointer, MD [facility]  11/26/19 Procedure visit Milinda Pointer, MD [facility]  Showing recent visits within past 90 days and meeting all other requirements Today's Visits Date Type Provider Dept  02/16/20 Procedure visit Milinda Pointer, MD [facility]  Showing today's visits and meeting all other requirements Future Appointments Date Type Provider Dept  03/08/20 Appointment Milinda Pointer, MD [facility]  03/16/20 Appointment Milinda Pointer, MD [facility]  Showing future appointments within next  90 days and meeting all other requirements  Disposition: Discharge home  Discharge (Date  Time): 02/16/2020; 1020 hrs.   Primary Care Physician: Tracie Harrier, MD Location: [facility] Note by: Gaspar Cola, MD Date: 02/16/2020; Time: 11:27 AM  Disclaimer:  Medicine is not an Chief Strategy Officer. The only guarantee in medicine is that nothing is guaranteed. It is important to note that the decision to proceed with this intervention was based on the information collected from the patient. The Data and conclusions were drawn from the patient's questionnaire, the interview, and the physical examination. Because the information was provided in large part by the patient, it cannot be guaranteed that it has not been purposely or unconsciously manipulated. Every effort has been made to obtain as much relevant data as possible for this evaluation. It is important to note that the conclusions that lead to this procedure are derived in large part from the available data. Always take into account that the treatment will also be dependent on availability of resources and existing treatment guidelines, considered by other Pain Management Practitioners as being common knowledge and practice, at the time of the intervention. For Medico-Legal purposes, it is also important to point out that variation in procedural techniques and pharmacological choices are the acceptable norm. The indications, contraindications, technique, and results of the above procedure should only be interpreted and judged by a Board-Certified Interventional Pain Specialist with extensive familiarity and expertise in the same exact procedure and technique.

## 2020-02-16 NOTE — Progress Notes (Signed)
Safety precautions to be maintained throughout the outpatient stay will include: orient to surroundings, keep bed in low position, maintain call bell within reach at all times, provide assistance with transfer out of bed and ambulation.  

## 2020-02-17 ENCOUNTER — Telehealth: Payer: Self-pay

## 2020-02-17 NOTE — Telephone Encounter (Signed)
Post procedure phone call. Patient states she is doing good.  

## 2020-02-19 ENCOUNTER — Telehealth: Payer: Medicare Other | Admitting: Pain Medicine

## 2020-02-23 ENCOUNTER — Ambulatory Visit: Payer: Medicare Other | Admitting: Pain Medicine

## 2020-03-03 ENCOUNTER — Encounter: Payer: Self-pay | Admitting: Pain Medicine

## 2020-03-08 ENCOUNTER — Ambulatory Visit: Payer: Medicare Other | Admitting: Pain Medicine

## 2020-03-16 ENCOUNTER — Telehealth: Payer: Medicare Other | Admitting: Pain Medicine

## 2020-03-29 ENCOUNTER — Encounter: Payer: Self-pay | Admitting: Pain Medicine

## 2020-03-29 ENCOUNTER — Other Ambulatory Visit: Payer: Self-pay

## 2020-03-29 ENCOUNTER — Ambulatory Visit
Admission: RE | Admit: 2020-03-29 | Discharge: 2020-03-29 | Disposition: A | Discharge status: Home / Self Care | Payer: Medicare Other | Source: Ambulatory Visit | Attending: Pain Medicine | Admitting: Pain Medicine

## 2020-03-29 ENCOUNTER — Ambulatory Visit (HOSPITAL_BASED_OUTPATIENT_CLINIC_OR_DEPARTMENT_OTHER): Payer: Medicare Other | Admitting: Pain Medicine

## 2020-03-29 ENCOUNTER — Telehealth: Payer: Self-pay | Admitting: Pain Medicine

## 2020-03-29 VITALS — BP 101/65 | HR 87 | Temp 97.4°F | Resp 16 | Ht 67.0 in | Wt 202.0 lb

## 2020-03-29 DIAGNOSIS — G8929 Other chronic pain: Secondary | ICD-10-CM

## 2020-03-29 DIAGNOSIS — Z91013 Allergy to seafood: Secondary | ICD-10-CM | POA: Insufficient documentation

## 2020-03-29 DIAGNOSIS — M4802 Spinal stenosis, cervical region: Secondary | ICD-10-CM | POA: Diagnosis present

## 2020-03-29 DIAGNOSIS — G90522 Complex regional pain syndrome I of left lower limb: Secondary | ICD-10-CM | POA: Diagnosis present

## 2020-03-29 DIAGNOSIS — M79641 Pain in right hand: Secondary | ICD-10-CM | POA: Diagnosis present

## 2020-03-29 DIAGNOSIS — M79642 Pain in left hand: Secondary | ICD-10-CM | POA: Insufficient documentation

## 2020-03-29 DIAGNOSIS — M542 Cervicalgia: Secondary | ICD-10-CM | POA: Diagnosis present

## 2020-03-29 DIAGNOSIS — M4310 Spondylolisthesis, site unspecified: Secondary | ICD-10-CM | POA: Diagnosis present

## 2020-03-29 DIAGNOSIS — G959 Disease of spinal cord, unspecified: Secondary | ICD-10-CM | POA: Insufficient documentation

## 2020-03-29 DIAGNOSIS — M5136 Other intervertebral disc degeneration, lumbar region: Secondary | ICD-10-CM | POA: Insufficient documentation

## 2020-03-29 DIAGNOSIS — M79604 Pain in right leg: Secondary | ICD-10-CM | POA: Insufficient documentation

## 2020-03-29 DIAGNOSIS — M4712 Other spondylosis with myelopathy, cervical region: Secondary | ICD-10-CM

## 2020-03-29 DIAGNOSIS — M79605 Pain in left leg: Secondary | ICD-10-CM | POA: Insufficient documentation

## 2020-03-29 DIAGNOSIS — Z87892 Personal history of anaphylaxis: Secondary | ICD-10-CM | POA: Insufficient documentation

## 2020-03-29 DIAGNOSIS — Z91041 Radiographic dye allergy status: Secondary | ICD-10-CM | POA: Insufficient documentation

## 2020-03-29 MED ORDER — LIDOCAINE HCL 2 % IJ SOLN
20.0000 mL | Freq: Once | INTRAMUSCULAR | Status: AC
  Administered 2020-03-29: 400 mg
  Filled 2020-03-29: qty 40

## 2020-03-29 MED ORDER — MIDAZOLAM HCL 5 MG/5ML IJ SOLN
1.0000 mg | INTRAMUSCULAR | Status: DC | PRN
  Administered 2020-03-29: 2 mg via INTRAVENOUS
  Filled 2020-03-29: qty 5

## 2020-03-29 MED ORDER — DEXAMETHASONE SODIUM PHOSPHATE 10 MG/ML IJ SOLN
10.0000 mg | Freq: Once | INTRAMUSCULAR | Status: AC
  Administered 2020-03-29: 10 mg
  Filled 2020-03-29: qty 1

## 2020-03-29 MED ORDER — FENTANYL CITRATE (PF) 100 MCG/2ML IJ SOLN
25.0000 ug | INTRAMUSCULAR | Status: DC | PRN
  Administered 2020-03-29: 50 ug via INTRAVENOUS
  Filled 2020-03-29: qty 2

## 2020-03-29 MED ORDER — BUPIVACAINE-EPINEPHRINE (PF) 0.25% -1:200000 IJ SOLN
10.0000 mL | Freq: Once | INTRAMUSCULAR | Status: AC
  Administered 2020-03-29: 10 mL
  Filled 2020-03-29: qty 30

## 2020-03-29 MED ORDER — LACTATED RINGERS IV SOLN
1000.0000 mL | Freq: Once | INTRAVENOUS | Status: AC
  Administered 2020-03-29: 1000 mL via INTRAVENOUS

## 2020-03-29 NOTE — Progress Notes (Signed)
PROVIDER NOTE: Information contained herein reflects review and annotations entered in association with encounter. Interpretation of such information and data should be left to medically-trained personnel. Information provided to patient can be located elsewhere in the medical record under "Patient Instructions". Document created using STT-dictation technology, any transcriptional errors that may result from process are unintentional.    Patient: Samantha Macias  Service Category: Procedure  Provider: Gaspar Cola, MD  DOB: [birth year]  DOS: 03/29/2020  Location: [facility]  MRN: [id number]  Setting: Ambulatory - outpatient  Referring Provider: Tracie Harrier, MD  Type: Established Patient  Specialty: Interventional Pain Management  PCP: Tracie Harrier, MD   Primary Reason for Visit: Interventional Pain Management Treatment. CC: Foot Pain (left)  Procedure:          Anesthesia, Analgesia, Anxiolysis:  Type: Therapeutic Lumbar Sympathetic Block #6  Region:Lumbosacral Level: L3, L4 Laterality: Left Paravertebral  Type: Moderate (Conscious) Sedation combined with Local Anesthesia Indication(s): Analgesia and Anxiety Route: Intravenous (IV) IV Access: Secured Sedation: Meaningful verbal contact was maintained at all times during the procedure  Local Anesthetic: Lidocaine 1-2%  Position: Prone with head of the table was raised to facilitate breathing.   Indications: 1. Chronic lower extremity pain (Secondary Area of Pain) (Bilateral) (L>R)   2. CRPS 1 (complex regional pain syndrome I) of lower limb (Left)   3. DDD (degenerative disc disease), lumbar   4. Grade 1 Retrolisthesis of L5/S1    Pain Score: Pre-procedure: 7 /10 Post-procedure: 0-No pain/10   Today is the last seen a series of 5 left-sided lumbar sympathetic blocks for the left foot CRPS.  This seems to have improved significantly and currently she describes her worst pain to be in between the  shoulder blades.  Further questioning revealed that this pain between the shoulder blades worsens when she moves her neck.  She also indicates having numbness and weakness of the right upper extremity, which in combination with the symptoms that she is having between the shoulder blades (rhomboids) would suggest the possibility of a right-sided C5 radiculopathy.  She has indicated that she wants this treated next and therefore we will need to plan on doing a cervical epidural steroid injection in the future.  Pre-op Assessment:  Samantha Macias is a 59 y.o. (year old), female patient, seen today for interventional treatment. She  has a past surgical history that includes Tubal ligation; Nasal sinus surgery; Colonoscopy with propofol (N/A, 04/01/2017); Esophagogastroduodenoscopy (egd) with propofol (N/A, 04/01/2017); and Mouth surgery. Samantha Macias has a current medication list which includes the following prescription(s): albuterol, alprazolam, bupropion, diurex, vitamin d, diclofenac sodium, tylenol pm extra strength, donepezil, hydrocodone-acetaminophen, hydrocodone-acetaminophen, losartan-hydrochlorothiazide, meloxicam, multiple vitamins-minerals, pantoprazole, rybelsus, rybelsus, sertraline, spiriva handihaler, temazepam, tizanidine, anoro ellipta, and pregabalin, and the following Facility-Administered Medications: fentanyl and midazolam. Her primarily concern today is the Foot Pain (left)  Initial Vital Signs:  Pulse/HCG Rate: 87ECG Heart Rate: 82 Temp: (!) 97.5 F (36.4 C) Resp: 16 BP: 107/76 SpO2: 97 %  BMI: Estimated body mass index is 31.64 kg/m as calculated from the following:   Height as of this encounter: 5\' 7"  (1.702 m).   Weight as of this encounter: 202 lb (91.6 kg).  Risk Assessment: Allergies: Reviewed. She is allergic to contrast media [iodinated diagnostic agents], shellfish allergy, tetanus toxoid adsorbed, ace inhibitors, codeine, nitrofurantoin monohyd macro, and red dye.   Allergy Precautions: None required Coagulopathies: Reviewed. None identified.  Blood-thinner therapy: None at this time Active Infection(s): Reviewed. None identified.  Ms. Hennick is afebrile  Site Confirmation: Samantha Macias was asked to confirm the procedure and laterality before marking the site Procedure checklist: Completed Consent: Before the procedure and under the influence of no sedative(s), amnesic(s), or anxiolytics, the patient was informed of the treatment options, risks and possible complications. To fulfill our ethical and legal obligations, as recommended by the American Medical Association's Code of Ethics, I have informed the patient of my clinical impression; the nature and purpose of the treatment or procedure; the risks, benefits, and possible complications of the intervention; the alternatives, including doing nothing; the risk(s) and benefit(s) of the alternative treatment(s) or procedure(s); and the risk(s) and benefit(s) of doing nothing. The patient was provided information about the general risks and possible complications associated with the procedure. These may include, but are not limited to: failure to achieve desired goals, infection, bleeding, organ or nerve damage, allergic reactions, paralysis, and death. In addition, the patient was informed of those risks and complications associated to Spine-related procedures, such as failure to decrease pain; infection (i.e.: Meningitis, epidural or intraspinal abscess); bleeding (i.e.: epidural hematoma, subarachnoid hemorrhage, or any other type of intraspinal or peri-dural bleeding); organ or nerve damage (i.e.: Any type of peripheral nerve, nerve root, or spinal cord injury) with subsequent damage to sensory, motor, and/or autonomic systems, resulting in permanent pain, numbness, and/or weakness of one or several areas of the body; allergic reactions; (i.e.: anaphylactic reaction); and/or death. Furthermore, the patient was  informed of those risks and complications associated with the medications. These include, but are not limited to: allergic reactions (i.e.: anaphylactic or anaphylactoid reaction(s)); adrenal axis suppression; blood sugar elevation that in diabetics may result in ketoacidosis or comma; water retention that in patients with history of congestive heart failure may result in shortness of breath, pulmonary edema, and decompensation with resultant heart failure; weight gain; swelling or edema; medication-induced neural toxicity; particulate matter embolism and blood vessel occlusion with resultant organ, and/or nervous system infarction; and/or aseptic necrosis of one or more joints. Finally, the patient was informed that Medicine is not an exact science; therefore, there is also the possibility of unforeseen or unpredictable risks and/or possible complications that may result in a catastrophic outcome. The patient indicated having understood very clearly. We have given the patient no guarantees and we have made no promises. Enough time was given to the patient to ask questions, all of which were answered to the patient's satisfaction. Ms. Pope has indicated that she wanted to continue with the procedure. Attestation: I, the ordering provider, attest that I have discussed with the patient the benefits, risks, side-effects, alternatives, likelihood of achieving goals, and potential problems during recovery for the procedure that I have provided informed consent. Date  Time: 03/29/2020  9:35 AM  Pre-Procedure Preparation:  Monitoring: As per clinic protocol. Respiration, ETCO2, SpO2, BP, heart rate and rhythm monitor placed and checked for adequate function Safety Precautions: Patient was assessed for positional comfort and pressure points before starting the procedure. Time-out: I initiated and conducted the "Time-out" before starting the procedure, as per protocol. The patient was asked to participate by  confirming the accuracy of the "Time Out" information. Verification of the correct person, site, and procedure were performed and confirmed by me, the nursing staff, and the patient. "Time-out" conducted as per Joint Commission's Universal Protocol (UP.01.01.01). Time: 1025  Description of Procedure:          Target Area: For Lumbar Sympathetic Block(s), the target is the anterolateral aspect of the  L3 & L4 vertebral bodies, where the lumbar sympathetic chain resides. Approach: Paravertebral, ipsilateral approach. Area Prepped: Entire Posterior Thoracolumbar Region DuraPrep (Iodine Povacrylex [0.7% available iodine] and Isopropyl Alcohol, 74% w/w) Safety Precautions: Aspiration looking for blood return was conducted prior to all injections. At no point did we inject any substances, as a needle was being advanced. No attempts were made at seeking any paresthesias. Safe injection practices and needle disposal techniques used. Medications properly checked for expiration dates. SDV (single dose vial) medications used. Description of the Procedure: Protocol guidelines were followed. The patient was placed in position over the procedure table. The target area was identified and the area prepped in the usual manner. Skin & deeper tissues infiltrated with local anesthetic. Appropriate amount of time allowed to pass for local anesthetics to take effect. The procedure needles were then advanced to the target area, the superior anterolateral border of the L3 vertebral body, under pulsed fluoroscopic guidance. Care was taken not to advance the tip of the needle past the anterior border of the vertebral body, on the lateral fluoroscopic view. Proper needle placement secured. Negative aspiration confirmed. Solution injected in intermittent fashion, asking for systemic symptoms every 0.5cc of injectate. The needles were then removed and the area cleansed, making sure to leave some of the prepping solution back to take  advantage of its long term bactericidal properties. Vitals:   03/29/20 1030 03/29/20 1040 03/29/20 1050 03/29/20 1102  BP: 108/75 (!) 95/59 100/61 101/65  Pulse:      Resp: 18 15 16 16   Temp:  98.1 F (36.7 C)  (!) 97.4 F (36.3 C)  SpO2: 97% 97% 96% 96%  Weight:      Height:        Start Time: 1025 hrs. End Time: 1030 hrs. Materials:  Needle(s) Type: Spinal Needle Gauge: 22G Length: 3.5-in Medication(s): Please see orders for medications and dosing details.  Imaging Guidance (Spinal):          Type of Imaging Technique: Fluoroscopy Guidance (Spinal) Indication(s): Assistance in needle guidance and placement for procedures requiring needle placement in or near specific anatomical locations not easily accessible without such assistance. Exposure Time: Please see nurses notes. Contrast: Before injecting any contrast, we confirmed that the patient did not have an allergy to iodine, shellfish, or radiological contrast. Once satisfactory needle placement was completed at the desired level, radiological contrast was injected. Contrast injected under live fluoroscopy. No contrast complications. See chart for type and volume of contrast used. Fluoroscopic Guidance: I was personally present during the use of fluoroscopy. "Tunnel Vision Technique" used to obtain the best possible view of the target area. Parallax error corrected before commencing the procedure. "Direction-depth-direction" technique used to introduce the needle under continuous pulsed fluoroscopy. Once target was reached, antero-posterior, oblique, and lateral fluoroscopic projection used confirm needle placement in all planes. Images permanently stored in EMR. Interpretation: I personally interpreted the imaging intraoperatively. Adequate needle placement confirmed in multiple planes. Appropriate spread of contrast into desired area was observed. No evidence of afferent or efferent intravascular uptake. No intrathecal or  subarachnoid spread observed. Permanent images saved into the patient's record.  Antibiotic Prophylaxis:   Anti-infectives (From admission, onward)   None     Indication(s): None identified  Post-operative Assessment:  Post-procedure Vital Signs:  Pulse/HCG Rate: 8780 Temp: (!) 97.4 F (36.3 C) (left foot 77.6  right foot 81.1) Resp: 16 BP: 101/65 SpO2: 96 %  EBL: None  Complications: No immediate post-treatment complications observed by team,  or reported by patient.  Note: The patient tolerated the entire procedure well. A repeat set of vitals were taken after the procedure and the patient was kept under observation following institutional policy, for this type of procedure. Post-procedural neurological assessment was performed, showing return to baseline, prior to discharge. The patient was provided with post-procedure discharge instructions, including a section on how to identify potential problems. Should any problems arise concerning this procedure, the patient was given instructions to immediately contact us, at any time, without hesitation. In any case, we plan to contact the patient by telephone for a follow-up status report regarding this interventional procedure.  Comments:  No additional relevant information.  Plan of Care  Orders:  Orders Placed This Encounter  Procedures  . LUMBAR SYMPATHETIC BLOCK    For sympathetically-mediated lower extremity pain.    Scheduling Instructions:     Purpose: Therapeutic     Laterality: Left-sided     Level(s): Lumbar sympathetic chain (L3, L4)     Sedation: Sedation recommended.     Scheduling Timeframe: Today    Order Specific Question:   Where will this procedure be performed?    Answer:   [facility]  . Cervical Epidural Injection    Level(s): C7-T1 Laterality: Right-sided Purpose: Diagnostic/Therapeutic Indication(s): Radiculitis and cervicalgia associater with cervical degenerative disc disease.    Standing  Status:   Future    Standing Expiration Date:   04/29/2020    Scheduling Instructions:     Procedure: Cervical Epidural Steroid Injection/Block     Sedation: With Sedation.     Timeframe: 3 weeks from now    Order Specific Question:   Where will this procedure be performed?    Answer:   [facility]    Comments:   by Dr. Dossie Arbour  . [facility] C-ARM 1-60 MIN NO REPORT    Intraoperative interpretation by procedural physician at [place].    Standing Status:   Standing    Number of Occurrences:   1    Order Specific Question:   Reason for exam:    Answer:   Assistance in needle guidance and placement for procedures requiring needle placement in or near specific anatomical locations not easily accessible without such assistance.  . Informed Consent Details: Physician/Practitioner Attestation; Transcribe to consent form and obtain patient signature    Provider Attestation: I, [place] Dossie Arbour, MD, (Pain Management Specialist), the physician/practitioner, attest that I have discussed with the patient the benefits, risks, side effects, alternatives, likelihood of achieving goals and potential problems during recovery for the procedure that I have provided informed consent.    Scheduling Instructions:     Note: Always confirm laterality of pain with [name], before procedure.    Order Specific Question:   Physician/Practitioner attestation of informed consent for procedure/surgical case    Answer:   I, the physician/practitioner, attest that I have discussed with the patient the benefits, risks, side effects, alternatives, likelihood of achieving goals and potential problems during recovery for the procedure that I have provided informed consent.    Order Specific Question:   Procedure    Answer:   Lumbar sympathetic block    Order Specific Question:   Physician/Practitioner performing the procedure    Answer:   Lerline Valdivia A. Dossie Arbour, MD    Order Specific Question:    Indication/Reason    Answer:   Lower extremity sympathetically mediated pain  . Provide equipment / supplies at bedside    "Block  Tray" (Disposable  single use) Needle type: SpinalSpinal Amount/quantity: 1 Size: Long (7-inch) Gauge: 22G    Standing Status:   Standing    Number of Occurrences:   1    Order Specific Question:   Specify    Answer:   Block Tray  . Check temperature    Place skin temperature probes in both lower extremities to monitor temperature changes from sympathetic blockade. Record baseline temperatures before block. Record final temperatures 10 to 15 minutes after block and notify physician of difference between the two.    Standing Status:   Standing    Number of Occurrences:   1   Chronic Opioid Analgesic:  None provided by our practice. NO OPIOIDS: UDS (08/07/17) (+) for Unreported Benzoylecgonine, a metabolite of cocaine; its presence    indicates use of this drug.  Source is most commonly illicit.   Medications ordered for procedure: Meds ordered this encounter  Medications  . lidocaine (XYLOCAINE) 2 % (with pres) injection 400 mg  . lactated ringers infusion 1,000 mL  . midazolam (VERSED) 5 MG/5ML injection 1-2 mg    Make sure Flumazenil is available in the pyxis when using this medication. If oversedation occurs, administer 0.2 mg IV over 15 sec. If after 45 sec no response, administer 0.2 mg again over 1 min; may repeat at 1 min intervals; not to exceed 4 doses (1 mg)  . fentaNYL (SUBLIMAZE) injection 25-50 mcg    Make sure Narcan is available in the pyxis when using this medication. In the event of respiratory depression (RR< 8/min): Titrate NARCAN (naloxone) in increments of 0.1 to 0.2 mg IV at 2-3 minute intervals, until desired degree of reversal.  . dexamethasone (DECADRON) injection 10 mg  . bupivacaine-epinephrine (MARCAINE W/ EPI) 0.25% -1:200000 injection 10 mL   Medications administered: We administered lidocaine, lactated ringers, midazolam,  fentaNYL, dexamethasone, and bupivacaine-epinephrine.  See the medical record for exact dosing, route, and time of administration.  Follow-up plan:   Return in about 2 weeks (around 04/12/2020) for (VV), (PP) Follow-up (+ R-CESI w/ sedation, 3wks from now).       Interventional Pending:      Under consideration:   NOTE: (IODINE) SHELLFISH & CONTRAST ALLERGY!! (ANAPHYLACTIC)  Diagnostic midline LESI  Possible bilateral thoracic RFA  Diagnostic left IA knee injection    Palliative treatment options:   Therapeutic/palliative left lumbar sympathetic block #7  Diagnostic bilateral cervical facet block #2  Diagnostic bilateral Thoracic T7, T8, T9, & T10 Facet Medial Branch block #2 (80/100/100)  Diagnostic/therapeutic left CESI #2 (100/100/100)  Palliative bilateral lumbar facet block #3  Palliative left lumbar facet RFA #2 (last done 05/19/2019)  Palliative right lumbar facet RFA #1 (last done 06/30/2019)     Recent Visits Date Type Provider Dept  02/16/20 Procedure visit Milinda Pointer, MD [facility]  01/05/20 Procedure visit Milinda Pointer, MD [facility]  Showing recent visits within past 90 days and meeting all other requirements Today's Visits Date Type Provider Dept  03/29/20 Procedure visit Milinda Pointer, MD [facility]  Showing today's visits and meeting all other requirements Future Appointments Date Type Provider Dept  04/12/20 Appointment Milinda Pointer, MD [facility]  04/19/20 Appointment Milinda Pointer, MD [facility]  Showing future appointments within next 90 days and meeting all other requirements  Disposition: Discharge home  Discharge (Date  Time): 03/29/2020; 1105 hrs.   Primary Care Physician: Tracie Harrier, MD Location: [facility] Note by: Beatriz Chancellor  Farrel Conners, MD Date: 03/29/2020; Time: 11:08 AM  Disclaimer:  Medicine is not an Chief Strategy Officer. The  only guarantee in medicine is that nothing is guaranteed. It is important to note that the decision to proceed with this intervention was based on the information collected from the patient. The Data and conclusions were drawn from the patient's questionnaire, the interview, and the physical examination. Because the information was provided in large part by the patient, it cannot be guaranteed that it has not been purposely or unconsciously manipulated. Every effort has been made to obtain as much relevant data as possible for this evaluation. It is important to note that the conclusions that lead to this procedure are derived in large part from the available data. Always take into account that the treatment will also be dependent on availability of resources and existing treatment guidelines, considered by other Pain Management Practitioners as being common knowledge and practice, at the time of the intervention. For Medico-Legal purposes, it is also important to point out that variation in procedural techniques and pharmacological choices are the acceptable norm. The indications, contraindications, technique, and results of the above procedure should only be interpreted and judged by a Board-Certified Interventional Pain Specialist with extensive familiarity and expertise in the same exact procedure and technique.

## 2020-03-29 NOTE — Telephone Encounter (Signed)
Patient called and she states that the bruising  at the IV site that was addressed at her procedure visit has gone past the marking that I made before discharge. No new places noted per patient. I informed her to continue with the ice pack if needed and that we would reassess tomorrow at the follow up call. She also states that her legs still feel a little wobbly. I assured her that this is normal and that it should subside by the morning. Instructed to be very careful when OOB due to weakness. Again informed her that we would reassess at her follow up call tomorrow.

## 2020-03-29 NOTE — Patient Instructions (Signed)

## 2020-03-29 NOTE — Telephone Encounter (Signed)
Patient states Anderson Malta marked her arm after IV pull and told her to call if the bruising got past the marks. She has 2 areas that are past the marks now. She would like a call please to discuss.

## 2020-03-30 ENCOUNTER — Telehealth: Payer: Self-pay

## 2020-03-30 NOTE — Telephone Encounter (Signed)
Post procedure phone call.  Patient states she is doing OK 

## 2020-04-11 ENCOUNTER — Encounter: Payer: Self-pay | Admitting: Pain Medicine

## 2020-04-11 NOTE — Progress Notes (Signed)
Patient: Samantha Macias  Service Category: E/M  Provider: Gaspar Cola, MD  DOB: 04/20/1961  DOS: 04/12/2020  Location: Office  MRN: [id number]  Setting: Ambulatory outpatient  Referring Provider: Tracie Harrier, MD  Type: Established Patient  Specialty: Interventional Pain Management  PCP: Tracie Harrier, MD  Location: Remote location  Delivery: TeleHealth     Virtual Encounter - Pain Management PROVIDER NOTE: Information contained herein reflects review and annotations entered in association with encounter. Interpretation of such information and data should be left to medically-trained personnel. Information provided to patient can be located elsewhere in the medical record under "Patient Instructions". Document created using STT-dictation technology, any transcriptional errors that may result from process are unintentional.    Contact & Pharmacy Preferred: [phone] Home: [phone] (home) Mobile: [phone] (mobile) E-mail: [email or web address]  [address], [place]. [place] [address] Phone: [phone] Fax: [phone]   Pre-screening  Ms. Samantha Macias offered "in-person" vs "virtual" encounter. She indicated preferring virtual for this encounter.   Reason COVID-19*  Social distancing based on CDC and AMA recommendations.   I contacted Samantha Macias on 04/12/2020 via telephone.      I clearly identified myself as Gaspar Cola, MD. I verified that I was speaking with the correct person using two identifiers (Name: Samantha Macias, and date of birth: 21-Nov-1960).  Consent I sought verbal advanced consent from Samantha Macias for virtual visit interactions. I informed Samantha Macias of possible security and privacy concerns, risks, and limitations associated with providing "not-in-person" medical evaluation and management services. I also informed Samantha Macias of the availability of "in-person" appointments.  Finally, I informed her that there would be a charge for the virtual visit and that she could be  personally, fully or partially, financially responsible for it. Samantha Macias expressed understanding and agreed to proceed.   Historic Elements   Samantha Macias is a 59 y.o. year old, female patient evaluated today after our last contact on 03/29/2020. Samantha Macias  has a past medical history of Abnormal gait, Allergy, Anxiety, Arthritis, Back pain, Bipolar disorder ([place]), CAD (coronary artery disease), Chronic kidney disease, Collagen vascular disease ([place]), Colon polyp, COPD (chronic obstructive pulmonary disease) ([place]), [place], Fibromyalgia, GERD (gastroesophageal reflux disease), Hyperlipidemia, Hypertension, IBS (irritable bowel syndrome), Kidney disease, Migraine, Numbness and tingling, Renal insufficiency, Spinal stenosis, Static encephalopathy, Thyroid nodule (07/2017), and TIA (transient ischemic attack). She also  has a past surgical history that includes Tubal ligation; Nasal sinus surgery; Colonoscopy with propofol (N/A, 04/01/2017); Esophagogastroduodenoscopy (egd) with propofol (N/A, 04/01/2017); and Mouth surgery. Samantha Macias has a current medication list which includes the following prescription(s): albuterol, alprazolam, bupropion, diurex, vitamin d, cyanocobalamin, diclofenac sodium, tylenol pm extra strength, donepezil, hydrocodone-acetaminophen, losartan-hydrochlorothiazide, meloxicam, multiple vitamins-minerals, pantoprazole, pregabalin, rybelsus, sertraline, spiriva handihaler, temazepam, tizanidine, anoro ellipta, and rybelsus. She  reports that she has been smoking cigarettes. She has a 6.25 pack-year smoking history. She has never used smokeless tobacco. She reports current drug use. Drug: Cocaine. No history on file for alcohol use. Samantha Macias is allergic to contrast media [iodinated diagnostic agents], shellfish allergy, tetanus toxoid adsorbed, ace inhibitors, codeine, nitrofurantoin  monohyd macro, and red dye.   HPI  Today, she is being contacted for a post-procedure assessment.  The patient indicated that the left LSB did provide her with 100% relief of the pain while she was in the recovery room, but when she stood up, she felt an electrical-like sensation going  down the leg.  This electrical sensation originated over the anterior portion of her thigh.  In talking about this particular symptom it would seem that it could be secondary to the fact that she ran out of her Lyrica.  Today we will go ahead and renew this medication and we also plan to transfer the nonopioid to her PCP.  According to the patient after that sudden and short lived paresthesia, she rated the pain relief at 75% and a continue as such for the initial 4 to 6 hours, after which it then improved to an 85% benefit that seems to be ongoing at this time.  At this point we have done a series of up to 6 LSB's with improvement in the patient's CRPS.  However, she still has that occasionally this pain will return.  Although the patient definitely has a sympathetically mediated component to this left lower extremity pain, she also does have findings on her lumbar MRI which could also contribute to her lower extremity symptoms.  Because of this, we had a long conversation today about possible future plans to treat this recurrent pain.  We have come to the conclusion that we need to explore the possibility of an implant such as a spinal cord stimulator.  Today I will go ahead and refer the patient to a medical psychology evaluation for implant.  Once she has that, if there are no contraindications, then we will move onto a possible trial.  The only concern that I have about this is that she also has problems in the cervical region for which I have her scheduled to return next Tuesday for a cervical epidural steroid injection.  Post-Procedure Evaluation  Procedure (03/29/2020): Therapeutic left L3, L4 lumbar sympathetic block #6  under fluoroscopic guidance and IV sedation Pre-procedure pain level: 7/10 Post-procedure: 0/10 (100% relief)  Sedation: Sedation provided.  Effectiveness during initial hour after procedure(Ultra-Short Term Relief): 75 %.  Local anesthetic used: Long-acting (4-6 hours) Effectiveness: Defined as any analgesic benefit obtained secondary to the administration of local anesthetics. This carries significant diagnostic value as to the etiological location, or anatomical origin, of the pain. Duration of benefit is expected to coincide with the duration of the local anesthetic used.  Effectiveness during initial 4-6 hours after procedure(Short-Term Relief): 75 %.  Long-term benefit: Defined as any relief past the pharmacologic duration of the local anesthetics.  Effectiveness past the initial 6 hours after procedure(Long-Term Relief): 85 % (ongoing).  Current benefits: Defined as benefit that persist at this time.   Analgesia:  >75% relief (85% ongoing benefit of the pain in the left lower extremity) Function: Samantha Macias reports improvement in function ROM: Samantha Macias reports improvement in ROM  Pharmacotherapy Assessment  Analgesic: None provided by our practice. NO OPIOIDS: UDS (08/07/17) (+) for Unreported Benzoylecgonine, a metabolite of cocaine; its presence    indicates use of this drug.  Source is most commonly illicit.   Monitoring: [place] PMP: PDMP reviewed during this encounter.       Pharmacotherapy: No side-effects or adverse reactions reported. Compliance: No problems identified. Effectiveness: Clinically acceptable. Plan: Refer to "POC".  UDS:  Summary  Date Value Ref Range Status  03/17/2018 FINAL  Final    Comment:    ==================================================================== TOXAS[facility]RE COMP DRUG ANALYSIS,UR ==================================================================== Test                             Result       Flag  Units Drug Present and Declared  for Prescription Verification   Tramadol                       1825         EXPECTED   ng/mg creat   O-Desmethyltramadol            929          EXPECTED   ng/mg creat   N-Desmethyltramadol            1352         EXPECTED   ng/mg creat    Source of tramadol is a prescription medication.    O-desmethyltramadol and N-desmethyltramadol are expected    metabolites of tramadol.   Pregabalin                     PRESENT      EXPECTED   Tizanidine                     PRESENT      EXPECTED   Bupropion                      PRESENT      EXPECTED   Hydroxybupropion               PRESENT      EXPECTED    Hydroxybupropion is an expected metabolite of bupropion.   Mirtazapine                    PRESENT      EXPECTED   Quetiapine                     PRESENT      EXPECTED   Salicylate                     PRESENT      EXPECTED Drug Present not Declared for Prescription Verification   Benzoylecgonine                494          UNEXPECTED ng/mg creat    Benzoylecgonine is a metabolite of cocaine; its presence    indicates use of this drug.  Source is most commonly illicit, but    cocaine is present in some topical anesthetic solutions.   Acetaminophen                  PRESENT      UNEXPECTED   Diphenhydramine                PRESENT      UNEXPECTED Drug Absent but Declared for Prescription Verification   Alprazolam                     Not Detected UNEXPECTED ng/mg creat   Sertraline                     Not Detected UNEXPECTED   Diclofenac                     Not Detected UNEXPECTED    Topical diclofenac, as indicated in the declared medication list,    is not always detected even when used as directed.   Ibuprofen  Not Detected UNEXPECTED    Ibuprofen, as indicated in the declared medication list, is not    always detected even when used as directed. ==================================================================== Test                      Result    Flag   Units      Ref Range    Creatinine              159              mg/dL      >=20 ==================================================================== Declared Medications:  The flagging and interpretation on this report are based on the  following declared medications.  Unexpected results may arise from  inaccuracies in the declared medications.  **Note: The testing scope of this panel includes these medications:  Alprazolam (Xanax)  Bupropion (Wellbutrin)  Mirtazapine (Remeron)  Pregabalin (Lyrica)  Quetiapine (Seroquel)  Sertraline (Zoloft)  Tramadol (Ultram)  **Note: The testing scope of this panel does not include small to  moderate amounts of these reported medications:  Aspirin (Aspirin 81)  Ibuprofen  Tizanidine (Zanaflex)  Topical Diclofenac  **Note: The testing scope of this panel does not include following  reported medications:  Albuterol  Donepezil (Aricept)  Formoterol (Perforomist)  Hydrochlorothiazide (Hyzaar)  Ipratropium  Losartan (Hyzaar)  Omeprazole (Prilosec)  Pantoprazole (Protonix)  Ramelteon (Rozerem)  Tiotropium (Spiriva)  Vitamin D ==================================================================== For clinical consultation, please call [phone]. ====================================================================     Laboratory Chemistry Profile   Renal Lab Results  Component Value Date   BUN 22 03/17/2018   CREATININE 1.35 (H) 03/17/2018   BCR 16 03/17/2018   GFRAA 51 (L) 03/17/2018   GFRNONAA 44 (L) 03/17/2018     Hepatic Lab Results  Component Value Date   AST 13 03/17/2018   ALT 15 12/24/2017   ALBUMIN 4.4 03/17/2018   ALKPHOS 148 (H) 03/17/2018   LIPASE 37 12/24/2017   AMMONIA 23 07/07/2015     Electrolytes Lab Results  Component Value Date   NA 141 03/17/2018   K 4.3 03/17/2018   CL 99 03/17/2018   CALCIUM 9.1 03/17/2018   MG 1.8 03/17/2018     Bone Lab Results  Component Value Date   25OHVITD1 41 03/17/2018   25OHVITD2 <1.0  03/17/2018   25OHVITD3 41 03/17/2018     Inflammation (CRP: Acute Phase) (ESR: Chronic Phase) Lab Results  Component Value Date   CRP 17 (H) 03/17/2018   ESRSEDRATE 28 03/17/2018       Note: Above Lab results reviewed.  Imaging  DG PAIN CLINIC C-ARM 1-60 MIN NO REPORT Fluoro was used, but no Radiologist interpretation will be provided.  Please refer to "NOTES" tab for provider progress note.  Assessment  The primary encounter diagnosis was Chronic pain syndrome. Diagnoses of Chronic low back pain (Primary Area of Pain) (Bilateral) (L>R) w/ sciatica, DDD (degenerative disc disease), lumbar, Grade 1 Retrolisthesis of L5/S1, Lumbar central spinal stenosis (L4-5), Lumbar lateral recess stenosis (L4-5) (Right), Chronic lower extremity pain (Secondary Area of Pain) (Bilateral) (L>R), CRPS 1 (complex regional pain syndrome I) of lower limb (Left), Neurogenic pain, Fibromyalgia syndrome, Abnormal MRI, lumbar spine (04/16/2019), and Abnormal MRI, cervical spine (04/16/2019) were also pertinent to this visit.  Plan of Care  Problem-specific:  No problem-specific Assessment & Plan notes found for this encounter.  Samantha Macias has a current medication list which includes the following long-term medication(s): albuterol, bupropion, tylenol pm extra strength, donepezil, losartan-hydrochlorothiazide,  pantoprazole, pregabalin, sertraline, spiriva handihaler, and temazepam.  Pharmacotherapy (Medications Ordered): Meds ordered this encounter  Medications  . pregabalin (LYRICA) 200 MG capsule    Sig: Take 1 capsule (200 mg total) by mouth 3 (three) times daily.    Dispense:  90 capsule    Refill:  5    Fill one day early if pharmacy is closed on scheduled refill date. May substitute for generic if available.   Orders:  Orders Placed This Encounter  Procedures  . Ambulatory referral to Psychology    Referral Priority:   Routine    Referral Type:   Psychiatric    Referral Reason:    Specialty Services Required    Requested Specialty:   Psychology    Number of Visits Requested:   1   Follow-up plan:   Return for scheduled encounter, Procedure (w/ sedation): (R) CESI #1.      Interventional Pending:      Under consideration:   NOTE: (IODINE) SHELLFISH & CONTRAST ALLERGY!! (ANAPHYLACTIC)  Diagnostic midline LESI  Possible bilateral thoracic RFA  Diagnostic left IA knee injection    Palliative treatment options:   Therapeutic/palliative left lumbar sympathetic block #7 (last done 03/29/2020: 100/25/75/85) Diagnostic bilateral cervical facet block #2  Diagnostic bilateral Thoracic T7, T8, T9, & T10 Facet Medial Branch block #2 (80/100/100)  Diagnostic/therapeutic left CESI #2 (100/100/100)  Palliative bilateral lumbar facet block #3  Palliative left lumbar facet RFA #2 (last done 05/19/2019)  Palliative right lumbar facet RFA #1 (last done 06/30/2019)      Recent Visits Date Type Provider Dept  03/29/20 Procedure visit Milinda Pointer, MD [facility]  02/16/20 Procedure visit Milinda Pointer, MD [facility]  Showing recent visits within past 90 days and meeting all other requirements Today's Visits Date Type Provider Dept  04/12/20 Telemedicine Milinda Pointer, MD [facility]  Showing today's visits and meeting all other requirements Future Appointments Date Type Provider Dept  04/19/20 Appointment Milinda Pointer, MD [facility]  Showing future appointments within next 90 days and meeting all other requirements  I discussed the assessment and treatment plan with the patient. The patient was provided an opportunity to ask questions and all were answered. The patient agreed with the plan and demonstrated an understanding of the instructions.  Patient advised to call back or seek an in-person evaluation if the symptoms or condition worsens.  Duration of encounter: 18 minutes.  Note by: Gaspar Cola,  MD Date: 04/12/2020; Time: 8:46 AM

## 2020-04-12 ENCOUNTER — Other Ambulatory Visit: Payer: Self-pay

## 2020-04-12 ENCOUNTER — Ambulatory Visit: Payer: Medicare Other | Attending: Pain Medicine | Admitting: Pain Medicine

## 2020-04-12 DIAGNOSIS — M79604 Pain in right leg: Secondary | ICD-10-CM

## 2020-04-12 DIAGNOSIS — M5442 Lumbago with sciatica, left side: Secondary | ICD-10-CM

## 2020-04-12 DIAGNOSIS — M4310 Spondylolisthesis, site unspecified: Secondary | ICD-10-CM | POA: Diagnosis not present

## 2020-04-12 DIAGNOSIS — M792 Neuralgia and neuritis, unspecified: Secondary | ICD-10-CM

## 2020-04-12 DIAGNOSIS — G8929 Other chronic pain: Secondary | ICD-10-CM

## 2020-04-12 DIAGNOSIS — R937 Abnormal findings on diagnostic imaging of other parts of musculoskeletal system: Secondary | ICD-10-CM

## 2020-04-12 DIAGNOSIS — M48061 Spinal stenosis, lumbar region without neurogenic claudication: Secondary | ICD-10-CM

## 2020-04-12 DIAGNOSIS — M5136 Other intervertebral disc degeneration, lumbar region: Secondary | ICD-10-CM

## 2020-04-12 DIAGNOSIS — G90522 Complex regional pain syndrome I of left lower limb: Secondary | ICD-10-CM

## 2020-04-12 DIAGNOSIS — M79605 Pain in left leg: Secondary | ICD-10-CM

## 2020-04-12 DIAGNOSIS — M797 Fibromyalgia: Secondary | ICD-10-CM

## 2020-04-12 DIAGNOSIS — G894 Chronic pain syndrome: Secondary | ICD-10-CM

## 2020-04-12 DIAGNOSIS — M5441 Lumbago with sciatica, right side: Secondary | ICD-10-CM

## 2020-04-12 MED ORDER — PREGABALIN 200 MG PO CAPS: 200.0000 mg | ORAL_CAPSULE | Freq: Three times a day (TID) | ORAL | 5 refills | Status: AC

## 2020-04-12 NOTE — Patient Instructions (Signed)
Spinal Cord Stimulator Implantation  A spinal cord stimulator is a small device that makes electrical signals and sends them through wires (leads) to nerves in the spinal cord. This stimulates the nerves and can help relieve long-term (chronic) pain in the back, legs, or arms. Implantation is a procedure to place this device under the skin. This procedure is done if a temporary spinal cord stimulator effectively reduces your pain during a trial period. A spinal cord stimulator may have an electrical pulse generator, a battery, and leads. It may also come with a small remote that you can control. Stimulators with a rechargeable battery may last up to 10 years. Stimulators with a non-rechargeable battery may last 2-5 years before being replaced. Tell a health care provider about:  Any allergies you have.  All medicines you are taking, including vitamins, herbs, eye drops, creams, and over-the-counter medicines.  Any problems you or family members have had with anesthetic medicines.  Any blood disorders you have.  Any surgeries you have had.  Any medical conditions you have.  Whether you are pregnant or may be pregnant. What are the risks? Generally, this is a safe procedure. However, problems may occur, including:  Infection.  Bleeding.  Allergic reactions to medicines, devices, or dyes.  Damage to the skin where the battery is placed, or damage to nerves, back muscles, or the spinal cord.  The device or battery failing or not working.  A lead moving out of place.  Inability to move (paralysis).  A pocket of clear fluid forming under the skin (seroma).  Spinal fluid leakage.  Numbness.  Inability to control when you urinate or have a bowel movement (incontinence). What happens before the procedure? Staying hydrated Follow instructions from your health care provider about hydration, which may include:  Up to 2 hours before the procedure - you may continue to drink clear  liquids, such as water, clear fruit juice, black coffee, and plain tea. Eating and drinking restrictions Follow instructions from your health care provider about eating and drinking, which may include:  8 hours before the procedure - stop eating heavy meals or foods such as meat, fried foods, or fatty foods.  6 hours before the procedure - stop eating light meals or foods, such as toast or cereal.  6 hours before the procedure - stop drinking milk or drinks that contain milk.  2 hours before the procedure - stop drinking clear liquids. Medicines  Ask your health care provider about: ? Changing or stopping your regular medicines. This is especially important if you are taking diabetes medicines or blood thinners. ? Taking medicines such as aspirin and ibuprofen. These medicines can thin your blood. Do not take these medicines unless your health care provider tells you to take them. ? Taking over-the-counter medicines, vitamins, herbs, and supplements. General instructions  You may be asked to shower with a germ-killing soap.  Do not use any products that contain nicotine or tobacco, such as cigarettes and e-cigarettes. If you need help quitting, ask your health care provider.  You will have blood tests and physical exams.  You may have chest X-rays.  You will have an electrocardiogram (ECG) to check the electrical patterns and rhythms of your heart.  Plan to have someone take you home from the hospital or clinic.  Plan to have a responsible adult care for you for at least 24 hours after you leave the hospital or clinic. This is important.  Ask your health care provider what steps will  be taken to help prevent infection. These may include: ? Removing hair at the surgery site. ? Washing skin with a germ-killing soap. ? Antibiotic medicine. What happens during the procedure?  An IV will be inserted into one of your veins.  You will be given one or more of the following: ? A  medicine to help you relax (sedative). ? A medicine to numb the area (local anesthetic). ? A medicine to make you fall asleep (general anesthetic).  Dye may be injected into your spinal canal (epidural space) to make it easier to see on X-rays. X-rays may be done during the procedure to help implant the stimulator.  A small incision will be made in your back.  A small piece of bone may be removed from your spine to make room for the device.  Leads will be placed near your spinal cord. X-rays may be done to make sure they are in the right place.  The leads will be tested with stimulation, and you will be asked to react to the tests. If you are under general anesthetic, you will be woken up for this part, and then given more medicine to make you fall asleep again.  The leads will all be joined to one lead that connects to the pulse generator (lead wire).  The lead wire will be placed under your skin so that it leads from your spine to your abdomen or buttocks.  A small incision will be made in your abdomen or buttocks.  The pulse generator will be placed in either your abdomen or buttocks and connected to the lead wire. A small pocket of skin will be formed around it.  Your incisions will be closed with stitches (sutures).  Your incisions will be covered with bandages (dressings). The procedure may vary among health care providers and hospitals. What happens after the procedure?  Your blood pressure, heart rate, breathing rate, and blood oxygen level will be monitored until you leave the hospital or clinic.  You will be given pain medicine as needed.  You will have to drink fluids.  Your pulse generator may be programmed.  Do not drive until your health care provider approves.  You may have to stay in the hospital after the procedure. Ask your health care provider how long you will stay. Summary  A spinal cord stimulator sends electrical pulses through the leads to the spinal  cord. This can relieve pain.  A spinal cord stimulator may have an electrical pulse generator, a battery, and a small remote that you can control.  Plan to have a responsible adult care for you for at least 24 hours after you leave the hospital or clinic. This information is not intended to replace advice given to you by your health care provider. Make sure you discuss any questions you have with your health care provider. Document Revised: 12/19/2018 Document Reviewed: 07/04/2017 Elsevier Patient Education  2020 [employer].

## 2020-04-19 ENCOUNTER — Ambulatory Visit
Admission: RE | Admit: 2020-04-19 | Discharge: 2020-04-19 | Disposition: A | Discharge status: Home / Self Care | Payer: Medicare Other | Source: Ambulatory Visit | Attending: Pain Medicine | Admitting: Pain Medicine

## 2020-04-19 ENCOUNTER — Ambulatory Visit (HOSPITAL_BASED_OUTPATIENT_CLINIC_OR_DEPARTMENT_OTHER): Payer: Medicare Other | Admitting: Pain Medicine

## 2020-04-19 ENCOUNTER — Other Ambulatory Visit: Payer: Self-pay

## 2020-04-19 ENCOUNTER — Encounter: Payer: Self-pay | Admitting: Pain Medicine

## 2020-04-19 VITALS — BP 100/73 | HR 94 | Temp 97.3°F | Resp 17 | Ht 66.0 in | Wt 200.0 lb

## 2020-04-19 DIAGNOSIS — M4802 Spinal stenosis, cervical region: Secondary | ICD-10-CM | POA: Diagnosis not present

## 2020-04-19 DIAGNOSIS — M542 Cervicalgia: Secondary | ICD-10-CM | POA: Insufficient documentation

## 2020-04-19 DIAGNOSIS — Z87892 Personal history of anaphylaxis: Secondary | ICD-10-CM | POA: Insufficient documentation

## 2020-04-19 DIAGNOSIS — Z91013 Allergy to seafood: Secondary | ICD-10-CM | POA: Diagnosis present

## 2020-04-19 DIAGNOSIS — G959 Disease of spinal cord, unspecified: Secondary | ICD-10-CM | POA: Insufficient documentation

## 2020-04-19 DIAGNOSIS — M4722 Other spondylosis with radiculopathy, cervical region: Secondary | ICD-10-CM | POA: Diagnosis present

## 2020-04-19 DIAGNOSIS — Z91041 Radiographic dye allergy status: Secondary | ICD-10-CM

## 2020-04-19 DIAGNOSIS — G8929 Other chronic pain: Secondary | ICD-10-CM | POA: Diagnosis present

## 2020-04-19 DIAGNOSIS — M4712 Other spondylosis with myelopathy, cervical region: Secondary | ICD-10-CM

## 2020-04-19 DIAGNOSIS — M549 Dorsalgia, unspecified: Secondary | ICD-10-CM

## 2020-04-19 DIAGNOSIS — M5030 Other cervical disc degeneration, unspecified cervical region: Secondary | ICD-10-CM | POA: Diagnosis present

## 2020-04-19 MED ORDER — SODIUM CHLORIDE (PF) 0.9 % IJ SOLN
INTRAMUSCULAR | Status: AC
  Filled 2020-04-19: qty 10

## 2020-04-19 MED ORDER — SODIUM CHLORIDE 0.9% FLUSH
1.0000 mL | Freq: Once | INTRAVENOUS | Status: AC
  Administered 2020-04-19: 1 mL

## 2020-04-19 MED ORDER — MIDAZOLAM HCL 5 MG/5ML IJ SOLN
1.0000 mg | INTRAMUSCULAR | Status: DC | PRN
  Administered 2020-04-19: 2 mg via INTRAVENOUS
  Filled 2020-04-19: qty 5

## 2020-04-19 MED ORDER — ROPIVACAINE HCL 2 MG/ML IJ SOLN
1.0000 mL | Freq: Once | INTRAMUSCULAR | Status: AC
  Administered 2020-04-19: 1 mL via EPIDURAL
  Filled 2020-04-19: qty 10

## 2020-04-19 MED ORDER — FENTANYL CITRATE (PF) 100 MCG/2ML IJ SOLN
25.0000 ug | INTRAMUSCULAR | Status: DC | PRN
  Administered 2020-04-19: 50 ug via INTRAVENOUS
  Filled 2020-04-19: qty 2

## 2020-04-19 MED ORDER — LIDOCAINE HCL 2 % IJ SOLN
20.0000 mL | Freq: Once | INTRAMUSCULAR | Status: AC
  Administered 2020-04-19: 400 mg
  Filled 2020-04-19: qty 40

## 2020-04-19 MED ORDER — DEXAMETHASONE SODIUM PHOSPHATE 10 MG/ML IJ SOLN
10.0000 mg | Freq: Once | INTRAMUSCULAR | Status: AC
  Administered 2020-04-19: 10 mg
  Filled 2020-04-19: qty 1

## 2020-04-19 MED ORDER — LACTATED RINGERS IV SOLN
1000.0000 mL | Freq: Once | INTRAVENOUS | Status: AC
  Administered 2020-04-19: 1000 mL via INTRAVENOUS

## 2020-04-19 NOTE — Patient Instructions (Signed)

## 2020-04-19 NOTE — Progress Notes (Signed)
Safety precautions to be maintained throughout the outpatient stay will include: orient to surroundings, keep bed in low position, maintain call bell within reach at all times, provide assistance with transfer out of bed and ambulation.  

## 2020-04-19 NOTE — Progress Notes (Signed)
PROVIDER NOTE: Information contained herein reflects review and annotations entered in association with encounter. Interpretation of such information and data should be left to medically-trained personnel. Information provided to patient can be located elsewhere in the medical record under "Patient Instructions". Document created using STT-dictation technology, any transcriptional errors that may result from process are unintentional.    Patient: Samantha Macias  Service Category: Procedure  Provider: Gaspar Cola, MD  DOB: 1961-03-17  DOS: 04/19/2020  Location: [facility]  MRN: [id number]  Setting: Ambulatory - outpatient  Referring Provider: Tracie Harrier, MD  Type: Established Patient  Specialty: Interventional Pain Management  PCP: Tracie Harrier, MD   Primary Reason for Visit: Interventional Pain Management Treatment. CC: Procedure  Procedure:          Anesthesia, Analgesia, Anxiolysis:  Type: Therapeutic, Inter-Laminar, Cervical Epidural Steroid Injection  #1  Region: Posterior Cervico-thoracic Region Level: C7-T1 Laterality: Right-Sided Paramedial  Type: Moderate (Conscious) Sedation combined with Local Anesthesia Indication(s): Analgesia and Anxiety Route: Intravenous (IV) IV Access: Secured Sedation: Meaningful verbal contact was maintained at all times during the procedure  Local Anesthetic: Lidocaine 1-2%  Position: Prone with head of the table was raised to facilitate breathing.   Indications: 1. Cervical central spinal stenosis   2. DDD (degenerative disc disease), cervical   3. Cervical foraminal stenosis   4. Cervical myelopathy ([place])   5. Osteoarthritis of spine with radiculopathy, cervical region   6. Cervical spondylosis with myelopathy   7. Cervicalgia (Bilateral)   8. Chronic upper back pain [facility]) (Bilateral) (R>L)    History of allergy to radiographic contrast media    History of allergy to shellfish     History of anaphylaxis    Pain Score: Pre-procedure: 6 /10 Post-procedure: 0-No pain/10   Pre-op H&P Assessment:  Ms. Werntz is a 59 y.o. (year old), female patient, seen today for interventional treatment. She  has a past surgical history that includes Tubal ligation; Nasal sinus surgery; Colonoscopy with propofol (N/A, 04/01/2017); Esophagogastroduodenoscopy (egd) with propofol (N/A, 04/01/2017); and Mouth surgery. Ms. Dildine has a current medication list which includes the following prescription(s): albuterol, alprazolam, bupropion, diurex, vitamin d, cyanocobalamin, diclofenac sodium, tylenol pm extra strength, donepezil, hydrocodone-acetaminophen, losartan-hydrochlorothiazide, meloxicam, multiple vitamins-minerals, pantoprazole, pregabalin, rybelsus, rybelsus, sertraline, spiriva handihaler, temazepam, tizanidine, and anoro ellipta, and the following Facility-Administered Medications: fentanyl and midazolam. Her primarily concern today is the Procedure  Initial Vital Signs:  Pulse/HCG Rate: 94ECG Heart Rate: 89 Temp: (!) 97.1 F (36.2 C) Resp: 18 BP: 94/71 SpO2: 96 %  BMI: Estimated body mass index is 32.28 kg/m as calculated from the following:   Height as of this encounter: 5\' 6"  (1.676 m).   Weight as of this encounter: 200 lb (90.7 kg).  Risk Assessment: Allergies: Reviewed. She is allergic to contrast media [iodinated diagnostic agents], shellfish allergy, tetanus toxoid adsorbed, ace inhibitors, codeine, nitrofurantoin monohyd macro, and red dye.  Allergy Precautions: None required Coagulopathies: Reviewed. None identified.  Blood-thinner therapy: None at this time Active Infection(s): Reviewed. None identified. Ms. Kilbride is afebrile  Site Confirmation: Ms. Hostler was asked to confirm the procedure and laterality before marking the site Procedure checklist: Completed Consent: Before the procedure and under the influence of no sedative(s), amnesic(s), or anxiolytics, the  patient was informed of the treatment options, risks and possible complications. To fulfill our ethical and legal obligations, as recommended by the American Medical Association's Code of Ethics, I have informed the patient of my clinical impression;  the nature and purpose of the treatment or procedure; the risks, benefits, and possible complications of the intervention; the alternatives, including doing nothing; the risk(s) and benefit(s) of the alternative treatment(s) or procedure(s); and the risk(s) and benefit(s) of doing nothing. The patient was provided information about the general risks and possible complications associated with the procedure. These may include, but are not limited to: failure to achieve desired goals, infection, bleeding, organ or nerve damage, allergic reactions, paralysis, and death. In addition, the patient was informed of those risks and complications associated to Spine-related procedures, such as failure to decrease pain; infection (i.e.: Meningitis, epidural or intraspinal abscess); bleeding (i.e.: epidural hematoma, subarachnoid hemorrhage, or any other type of intraspinal or peri-dural bleeding); organ or nerve damage (i.e.: Any type of peripheral nerve, nerve root, or spinal cord injury) with subsequent damage to sensory, motor, and/or autonomic systems, resulting in permanent pain, numbness, and/or weakness of one or several areas of the body; allergic reactions; (i.e.: anaphylactic reaction); and/or death. Furthermore, the patient was informed of those risks and complications associated with the medications. These include, but are not limited to: allergic reactions (i.e.: anaphylactic or anaphylactoid reaction(s)); adrenal axis suppression; blood sugar elevation that in diabetics may result in ketoacidosis or comma; water retention that in patients with history of congestive heart failure may result in shortness of breath, pulmonary edema, and decompensation with resultant  heart failure; weight gain; swelling or edema; medication-induced neural toxicity; particulate matter embolism and blood vessel occlusion with resultant organ, and/or nervous system infarction; and/or aseptic necrosis of one or more joints. Finally, the patient was informed that Medicine is not an exact science; therefore, there is also the possibility of unforeseen or unpredictable risks and/or possible complications that may result in a catastrophic outcome. The patient indicated having understood very clearly. We have given the patient no guarantees and we have made no promises. Enough time was given to the patient to ask questions, all of which were answered to the patient's satisfaction. Ms. Ivey has indicated that she wanted to continue with the procedure. Attestation: I, the ordering provider, attest that I have discussed with the patient the benefits, risks, side-effects, alternatives, likelihood of achieving goals, and potential problems during recovery for the procedure that I have provided informed consent. Date  Time: 04/19/2020 11:00 AM  Pre-Procedure Preparation:  Monitoring: As per clinic protocol. Respiration, ETCO2, SpO2, BP, heart rate and rhythm monitor placed and checked for adequate function Safety Precautions: Patient was assessed for positional comfort and pressure points before starting the procedure. Time-out: I initiated and conducted the "Time-out" before starting the procedure, as per protocol. The patient was asked to participate by confirming the accuracy of the "Time Out" information. Verification of the correct person, site, and procedure were performed and confirmed by me, the nursing staff, and the patient. "Time-out" conducted as per Joint Commission's Universal Protocol (UP.01.01.01). Time: 1201  Description of Procedure:          Target Area: For Epidural Steroid injections the target is the interlaminar space, initially targeting the lower border of the superior  vertebral body lamina. Approach: Paramedial approach. Area Prepped: Entire PosteriorCervical Region DuraPrep (Iodine Povacrylex [0.7% available iodine] and Isopropyl Alcohol, 74% w/w) Safety Precautions: Aspiration looking for blood return was conducted prior to all injections. At no point did we inject any substances, as a needle was being advanced. No attempts were made at seeking any paresthesias. Safe injection practices and needle disposal techniques used. Medications properly checked for expiration dates.  SDV (single dose vial) medications used. Description of the Procedure: Protocol guidelines were followed. The procedure needle was introduced through the skin, ipsilateral to the reported pain, and advanced to the target area. Bone was contacted and the needle walked caudad, until the lamina was cleared. The epidural space was identified using "loss-of-resistance technique" with 2-3 ml of PF-NaCl (0.9% NSS), in a 5cc LOR glass syringe. Vitals:   04/19/20 1209 04/19/20 1214 04/19/20 1224 04/19/20 1234  BP: 104/79 92/72 98/70  100/73  Pulse:      Resp: 12 16 17 17   Temp:  (!) 97.4 F (36.3 C)  (!) 97.3 F (36.3 C)  TempSrc:      SpO2: 92% 95% 95% 96%  Weight:      Height:        Start Time: 1201 hrs. End Time: 1208 hrs. Materials:  Needle(s) Type: Epidural needle Gauge: 17G Length: 3.5-in Medication(s): Please see orders for medications and dosing details.  Imaging Guidance (Spinal):          Type of Imaging Technique: Fluoroscopy Guidance (Spinal) Indication(s): Assistance in needle guidance and placement for procedures requiring needle placement in or near specific anatomical locations not easily accessible without such assistance. Exposure Time: Please see nurses notes. Contrast: None used. Fluoroscopic Guidance: I was personally present during the use of fluoroscopy. "Tunnel Vision Technique" used to obtain the best possible view of the target area. Parallax error corrected  before commencing the procedure. "Direction-depth-direction" technique used to introduce the needle under continuous pulsed fluoroscopy. Once target was reached, antero-posterior, oblique, and lateral fluoroscopic projection used confirm needle placement in all planes. Images permanently stored in EMR. Interpretation: No contrast injected. I personally interpreted the imaging intraoperatively. Adequate needle placement confirmed in multiple planes. Permanent images saved into the patient's record.  Antibiotic Prophylaxis:   Anti-infectives (From admission, onward)   None     Indication(s): None identified  Post-operative Assessment:  Post-procedure Vital Signs:  Pulse/HCG Rate: 9484 Temp: (!) 97.3 F (36.3 C) Resp: 17 BP: 100/73 SpO2: 96 %  EBL: None  Complications: No immediate post-treatment complications observed by team, or reported by patient.  Note: The patient tolerated the entire procedure well. A repeat set of vitals were taken after the procedure and the patient was kept under observation following institutional policy, for this type of procedure. Post-procedural neurological assessment was performed, showing return to baseline, prior to discharge. The patient was provided with post-procedure discharge instructions, including a section on how to identify potential problems. Should any problems arise concerning this procedure, the patient was given instructions to immediately contact us, at any time, without hesitation. In any case, we plan to contact the patient by telephone for a follow-up status report regarding this interventional procedure.  Comments:  No additional relevant information.  Plan of Care  Orders:  Orders Placed This Encounter  Procedures  . Cervical Epidural Injection    Procedure: Cervical Epidural Steroid Injection/Block Purpose: Therapeutic Indication(s): Radiculitis and cervicalgia associater with cervical degenerative disc disease.    Scheduling  Instructions:     Level(s): C7-T1     Laterality: Right-sided     Sedation: Patient's choice.     Timeframe: Today    Order Specific Question:   Where will this procedure be performed?    Answer:   [facility]    Comments:   by Dr. Dossie Arbour  . [facility] C-ARM 1-60 MIN NO REPORT    Intraoperative interpretation by procedural physician at [facility].  Standing Status:   Standing    Number of Occurrences:   1    Order Specific Question:   Reason for exam:    Answer:   Assistance in needle guidance and placement for procedures requiring needle placement in or near specific anatomical locations not easily accessible without such assistance.  . Informed Consent Details: Physician/Practitioner Attestation; Transcribe to consent form and obtain patient signature    Nursing Order: Transcribe to consent form and obtain patient signature. Note: Always confirm laterality of pain with [name], before procedure.    Order Specific Question:   Physician/Practitioner attestation of informed consent for procedure/surgical case    Answer:   I, the physician/practitioner, attest that I have discussed with the patient the benefits, risks, side effects, alternatives, likelihood of achieving goals and potential problems during recovery for the procedure that I have provided informed consent.    Order Specific Question:   Procedure    Answer:   Cervical Epidural Steroid Injection (CESI) under fluoroscopic guidance    Order Specific Question:   Physician/Practitioner performing the procedure    Answer:   Erendira Crabtree A. Dossie Arbour MD    Order Specific Question:   Indication/Reason    Answer:   Cervicalgia (Neck Pain) with or without Cervical Radiculopathy/Radiculitis (Arm/Shoulder Pain, Numbness, and/or weakness), secondary to Cervical and/or Cervicothoracic Degenerative Disc Disease (DDD), with or without Intervertebral Disc Displacement (IVD  . Provide equipment / supplies at bedside     "Epidural Tray" (Disposable  single use) Catheter: NOT required    Standing Status:   Standing    Number of Occurrences:   1    Order Specific Question:   Specify    Answer:   Epidural Tray  . Miscellanous precautions    Standing Status:   Standing    Number of Occurrences:   1  . Miscellanous precautions    NOTE: Although It is true that patients can have allergies to shellfish and that shellfish contain iodine, most shellfish  allergies are due to two protein allergens present in the shellfish: tropomyosins and parvalbumin. Not all patients with shellfish allergies are allergic to iodine. However, as a precaution, avoid using iodine containing products.    Standing Status:   Standing    Number of Occurrences:   1   Chronic Opioid Analgesic:  None provided by our practice. NO OPIOIDS: UDS (08/07/17) (+) for Unreported Benzoylecgonine, a metabolite of cocaine; its presence    indicates use of this drug.  Source is most commonly illicit.   Medications ordered for procedure: Meds ordered this encounter  Medications  . lidocaine (XYLOCAINE) 2 % (with pres) injection 400 mg  . lactated ringers infusion 1,000 mL  . midazolam (VERSED) 5 MG/5ML injection 1-2 mg    Make sure Flumazenil is available in the pyxis when using this medication. If oversedation occurs, administer 0.2 mg IV over 15 sec. If after 45 sec no response, administer 0.2 mg again over 1 min; may repeat at 1 min intervals; not to exceed 4 doses (1 mg)  . fentaNYL (SUBLIMAZE) injection 25-50 mcg    Make sure Narcan is available in the pyxis when using this medication. In the event of respiratory depression (RR< 8/min): Titrate NARCAN (naloxone) in increments of 0.1 to 0.2 mg IV at 2-3 minute intervals, until desired degree of reversal.  . sodium chloride flush (NS) 0.9 % injection 1 mL  . ropivacaine (PF) 2 mg/mL (0.2%) (NAROPIN) injection 1 mL  . dexamethasone (DECADRON) injection 10 mg  Medications administered: We  administered lidocaine, lactated ringers, midazolam, fentaNYL, sodium chloride flush, ropivacaine (PF) 2 mg/mL (0.2%), and dexamethasone.  See the medical record for exact dosing, route, and time of administration.  Follow-up plan:   Return in about 2 weeks (around 05/03/2020) for (F2F), (PP) Follow-up.       Interventional Pending:      Under consideration:   NOTE: (IODINE) SHELLFISH & CONTRAST ALLERGY!! (ANAPHYLACTIC)  Diagnostic midline LESI  Possible bilateral thoracic RFA  Diagnostic left IA knee injection    Palliative treatment options:   Therapeutic/palliative left lumbar sympathetic block #7 (last done 03/29/2020: 100/25/75/85) Diagnostic bilateral cervical facet block #2  Diagnostic bilateral Thoracic T7, T8, T9, & T10 Facet Medial Branch block #2 (80/100/100)  Diagnostic/therapeutic left CESI #2 (100/100/100)  Diagnostic/therapeutic right CESI #2  Palliative bilateral lumbar facet block #3  Palliative left lumbar facet RFA #2 (last done 05/19/2019)  Palliative right lumbar facet RFA #1 (last done 06/30/2019)     Recent Visits Date Type Provider Dept  04/12/20 Telemedicine Milinda Pointer, MD [facility]  03/29/20 Procedure visit Milinda Pointer, MD [facility]  02/16/20 Procedure visit Milinda Pointer, MD [facility]  Showing recent visits within past 90 days and meeting all other requirements Today's Visits Date Type Provider Dept  04/19/20 Procedure visit Milinda Pointer, MD [facility]  Showing today's visits and meeting all other requirements Future Appointments Date Type Provider Dept  05/04/20 Appointment Milinda Pointer, MD [facility]  Showing future appointments within next 90 days and meeting all other requirements  Disposition: Discharge home  Discharge (Date  Time): 04/19/2020; 1237 hrs.   Primary Care Physician: Tracie Harrier, MD Location: [facility]  Facility Note by: Gaspar Cola, MD Date: 04/19/2020; Time: 3:19 PM  Disclaimer:  Medicine is not an Chief Strategy Officer. The only guarantee in medicine is that nothing is guaranteed. It is important to note that the decision to proceed with this intervention was based on the information collected from the patient. The Data and conclusions were drawn from the patient's questionnaire, the interview, and the physical examination. Because the information was provided in large part by the patient, it cannot be guaranteed that it has not been purposely or unconsciously manipulated. Every effort has been made to obtain as much relevant data as possible for this evaluation. It is important to note that the conclusions that lead to this procedure are derived in large part from the available data. Always take into account that the treatment will also be dependent on availability of resources and existing treatment guidelines, considered by other Pain Management Practitioners as being common knowledge and practice, at the time of the intervention. For Medico-Legal purposes, it is also important to point out that variation in procedural techniques and pharmacological choices are the acceptable norm. The indications, contraindications, technique, and results of the above procedure should only be interpreted and judged by a Board-Certified Interventional Pain Specialist with extensive familiarity and expertise in the same exact procedure and technique.

## 2020-04-20 ENCOUNTER — Telehealth: Payer: Self-pay

## 2020-04-20 NOTE — Telephone Encounter (Signed)
Post procedure follow up phone call.  LM 

## 2020-05-04 ENCOUNTER — Encounter: Payer: Self-pay | Admitting: Pain Medicine

## 2020-05-04 ENCOUNTER — Other Ambulatory Visit: Payer: Self-pay

## 2020-05-04 ENCOUNTER — Ambulatory Visit: Payer: Medicare Other | Attending: Pain Medicine | Admitting: Pain Medicine

## 2020-05-04 DIAGNOSIS — G959 Disease of spinal cord, unspecified: Secondary | ICD-10-CM | POA: Diagnosis not present

## 2020-05-04 DIAGNOSIS — G894 Chronic pain syndrome: Secondary | ICD-10-CM | POA: Diagnosis not present

## 2020-05-04 DIAGNOSIS — M4802 Spinal stenosis, cervical region: Secondary | ICD-10-CM

## 2020-05-04 DIAGNOSIS — M542 Cervicalgia: Secondary | ICD-10-CM | POA: Diagnosis not present

## 2020-05-04 DIAGNOSIS — G8929 Other chronic pain: Secondary | ICD-10-CM

## 2020-05-04 DIAGNOSIS — M549 Dorsalgia, unspecified: Secondary | ICD-10-CM

## 2020-05-04 NOTE — Progress Notes (Signed)
Patient: Samantha Macias  Service Category: E/M  Provider: Gaspar Cola, MD  DOB: [birth year]  DOS: 05/04/2020  Location: Office  MRN: [id number]  Setting: Ambulatory outpatient  Referring Provider: Tracie Harrier, MD  Type: Established Patient  Specialty: Interventional Pain Management  PCP: Tracie Harrier, MD  Location: Remote location  Delivery: TeleHealth     Virtual Encounter - Pain Management PROVIDER NOTE: Information contained herein reflects review and annotations entered in association with encounter. Interpretation of such information and data should be left to medically-trained personnel. Information provided to patient can be located elsewhere in the medical record under "Patient Instructions". Document created using STT-dictation technology, any transcriptional errors that may result from process are unintentional.    Contact & Pharmacy Preferred: [phone] Home: [phone] (home) Mobile: [phone] (mobile) E-mail: [email or web address]  [place], [place]. [place] [address] Phone: [phone] Fax: [phone]   Pre-screening  [name] offered "in-person" vs "virtual" encounter. She indicated preferring virtual for this encounter.   Reason COVID-19*   Social distancing based on CDC and AMA recommendations.   I contacted [name] on 05/04/2020 via telephone.      I clearly identified myself as Gaspar Cola, MD. I verified that I was speaking with the correct person using two identifiers (Name: Samantha Macias, and date of birth: 1960-09-27).  Consent I sought verbal advanced consent from [name] for virtual visit interactions. I informed [name] of possible security and privacy concerns, risks, and limitations associated with providing "not-in-person" medical evaluation and management services. I also informed [name] of the availability of "in-person" appointments.  Finally, I informed her that there would be a charge for the virtual visit and that she could be  personally, fully or partially, financially responsible for it. [name] expressed understanding and agreed to proceed.   Historic Elements   Ms. Samantha Macias is a 59 y.o. year old, female patient evaluated today after our last contact on 04/19/2020. Samantha Macias  has a past medical history of Abnormal gait, Allergy, Anxiety, Arthritis, Back pain, Bipolar disorder ([place]), CAD (coronary artery disease), Chronic kidney disease, Collagen vascular disease ([place]), Colon polyp, COPD (chronic obstructive pulmonary disease) ([place]), Falls, Fibromyalgia, GERD (gastroesophageal reflux disease), Hyperlipidemia, Hypertension, IBS (irritable bowel syndrome), Kidney disease, Migraine, Numbness and tingling, Renal insufficiency, Spinal stenosis, Static encephalopathy, Thyroid nodule (07/2017), and TIA (transient ischemic attack). She also  has a past surgical history that includes Tubal ligation; Nasal sinus surgery; Colonoscopy with propofol (N/A, 04/01/2017); Esophagogastroduodenoscopy (egd) with propofol (N/A, 04/01/2017); and Mouth surgery. Samantha Macias has a current medication list which includes the following prescription(s): albuterol, alprazolam, bupropion, diurex, vitamin d, cyanocobalamin, diclofenac sodium, tylenol pm extra strength, donepezil, hydrocodone-acetaminophen, losartan-hydrochlorothiazide, meloxicam, multiple vitamins-minerals, pantoprazole, pregabalin, rybelsus, rybelsus, sertraline, spiriva handihaler, temazepam, tizanidine, and anoro ellipta. She  reports that she has been smoking cigarettes. She has a 6.25 pack-year smoking history. She has never used smokeless tobacco. She reports current drug use. Drug: Cocaine. No history on file for alcohol use. Samantha Macias is allergic to contrast media [iodinated diagnostic agents], shellfish allergy, tetanus toxoid adsorbed, ace inhibitors, codeine, nitrofurantoin  monohyd macro, and red dye.   HPI  Today, she is being contacted for a post-procedure assessment.  Post-Procedure Evaluation  Procedure (04/19/2020): Diagnostic/therapeutic right cervical ESI #2 under fluoroscopic guidance and IV sedation Pre-procedure pain level: 6/10 Post-procedure: 0/10 (100% relief)  Sedation: Sedation provided.  Effectiveness during initial hour  after procedure(Ultra-Short Term Relief): 70 %.  Local anesthetic used: Long-acting (4-6 hours) Effectiveness: Defined as any analgesic benefit obtained secondary to the administration of local anesthetics. This carries significant diagnostic value as to the etiological location, or anatomical origin, of the pain. Duration of benefit is expected to coincide with the duration of the local anesthetic used.  Effectiveness during initial 4-6 hours after procedure(Short-Term Relief): 70 %.  Long-term benefit: Defined as any relief past the pharmacologic duration of the local anesthetics.  Effectiveness past the initial 6 hours after procedure(Long-Term Relief): 0 %.  Current benefits: Defined as benefit that persist at this time.   Analgesia:  Back to baseline Function: Back to baseline ROM: Back to baseline  Pharmacotherapy Assessment  Analgesic: None provided by our practice. NO OPIOIDS: UDS (08/07/17) (+) for Unreported Benzoylecgonine, a metabolite of cocaine; its presence    indicates use of this drug.  Source is most commonly illicit.   Monitoring: [place] PMP: PDMP reviewed during this encounter.       Pharmacotherapy: No side-effects or adverse reactions reported. Compliance: No problems identified. Effectiveness: Clinically acceptable. Plan: Refer to "POC".  UDS:  Summary  Date Value Ref Range Status  03/17/2018 FINAL  Final    Comment:    ==================================================================== [facility]RE COMP DRUG ANALYSIS,UR ==================================================================== Test                              Result       Flag       Units Drug Present and Declared for Prescription Verification   Tramadol                       1825         EXPECTED   ng/mg creat   O-Desmethyltramadol            929          EXPECTED   ng/mg creat   N-Desmethyltramadol            1352         EXPECTED   ng/mg creat    Source of tramadol is a prescription medication.    O-desmethyltramadol and N-desmethyltramadol are expected    metabolites of tramadol.   Pregabalin                     PRESENT      EXPECTED   Tizanidine                     PRESENT      EXPECTED   Bupropion                      PRESENT      EXPECTED   Hydroxybupropion               PRESENT      EXPECTED    Hydroxybupropion is an expected metabolite of bupropion.   Mirtazapine                    PRESENT      EXPECTED   Quetiapine                     PRESENT      EXPECTED   Salicylate  PRESENT      EXPECTED Drug Present not Declared for Prescription Verification   Benzoylecgonine                494          UNEXPECTED ng/mg creat    Benzoylecgonine is a metabolite of cocaine; its presence    indicates use of this drug.  Source is most commonly illicit, but    cocaine is present in some topical anesthetic solutions.   Acetaminophen                  PRESENT      UNEXPECTED   Diphenhydramine                PRESENT      UNEXPECTED Drug Absent but Declared for Prescription Verification   Alprazolam                     Not Detected UNEXPECTED ng/mg creat   Sertraline                     Not Detected UNEXPECTED   Diclofenac                     Not Detected UNEXPECTED    Topical diclofenac, as indicated in the declared medication list,    is not always detected even when used as directed.   Ibuprofen                      Not Detected UNEXPECTED    Ibuprofen, as indicated in the declared medication list, is not    always detected even when used as  directed. ==================================================================== Test                      Result    Flag   Units      Ref Range   Creatinine              159              mg/dL      >=20 ==================================================================== Declared Medications:  The flagging and interpretation on this report are based on the  following declared medications.  Unexpected results may arise from  inaccuracies in the declared medications.  **Note: The testing scope of this panel includes these medications:  Alprazolam (Xanax)  Bupropion (Wellbutrin)  Mirtazapine (Remeron)  Pregabalin (Lyrica)  Quetiapine (Seroquel)  Sertraline (Zoloft)  Tramadol (Ultram)  **Note: The testing scope of this panel does not include small to  moderate amounts of these reported medications:  Aspirin (Aspirin 81)  Ibuprofen  Tizanidine (Zanaflex)  Topical Diclofenac  **Note: The testing scope of this panel does not include following  reported medications:  Albuterol  Donepezil (Aricept)  Formoterol (Perforomist)  Hydrochlorothiazide (Hyzaar)  Ipratropium  Losartan (Hyzaar)  Omeprazole (Prilosec)  Pantoprazole (Protonix)  Ramelteon (Rozerem)  Tiotropium (Spiriva)  Vitamin D ==================================================================== For clinical consultation, please call [phone]. ====================================================================     Laboratory Chemistry Profile   Renal Lab Results  Component Value Date   BUN 22 03/17/2018   CREATININE 1.35 (H) 03/17/2018   BCR 16 03/17/2018   GFRAA 51 (L) 03/17/2018   GFRNONAA 44 (L) 03/17/2018     Hepatic Lab Results  Component Value Date   AST 13 03/17/2018   ALT 15 12/24/2017   ALBUMIN 4.4 03/17/2018   ALKPHOS 148 (H) 03/17/2018   LIPASE  37 12/24/2017   AMMONIA 23 07/07/2015     Electrolytes Lab Results  Component Value Date   NA 141 03/17/2018   K 4.3 03/17/2018   CL 99  03/17/2018   CALCIUM 9.1 03/17/2018   MG 1.8 03/17/2018     Bone Lab Results  Component Value Date   25OHVITD1 41 03/17/2018   25OHVITD2 <1.0 03/17/2018   25OHVITD3 41 03/17/2018     Inflammation (CRP: Acute Phase) (ESR: Chronic Phase) Lab Results  Component Value Date   CRP 17 (H) 03/17/2018   ESRSEDRATE 28 03/17/2018       Note: Above Lab results reviewed.  Imaging  DG PAIN CLINIC C-ARM 1-60 MIN NO REPORT Fluoro was used, but no Radiologist interpretation will be provided.  Please refer to "NOTES" tab for provider progress note.  Assessment  The primary encounter diagnosis was Chronic pain syndrome. Diagnoses of Cervical central spinal stenosis, Cervical foraminal stenosis, Cervical myelopathy (HCC), Cervicalgia (Bilateral), and Chronic upper back pain [facility] Area of Pain) (Bilateral) (R>L) were also pertinent to this visit.  Plan of Care  Problem-specific:  No problem-specific Assessment & Plan notes found for this encounter.  Ms. Jasman Pfeifle has a current medication list which includes the following long-term medication(s): albuterol, bupropion, tylenol pm extra strength, donepezil, losartan-hydrochlorothiazide, pantoprazole, pregabalin, sertraline, spiriva handihaler, and temazepam.  Pharmacotherapy (Medications Ordered): No orders of the defined types were placed in this encounter.  Orders:  Orders Placed This Encounter  Procedures   Cervical Epidural Injection    Level(s): C7-T1 Laterality: TBD Purpose: Diagnostic/Therapeutic Indication(s): Radiculitis and cervicalgia associater with cervical degenerative disc disease.    Standing Status:   Future    Standing Expiration Date:   06/04/2020    Scheduling Instructions:     Procedure: Cervical Epidural Steroid Injection/Block     Sedation: Patient's choice.     Timeframe: As soon as schedule allows    Order Specific Question:   Where will this procedure be performed?    Answer:   [facility]     Comments:   by Dr. Dossie Arbour   MR CERVICAL SPINE WO CONTRAST    Patient presents with axial pain with possible radicular component.  In addition to any acute findings, please report on:  1. Facet (Zygapophyseal) joint DJD (Hypertrophy, space narrowing, subchondral sclerosis, and/or osteophyte formation) 2. DDD and/or IVDD (Loss of disc height, desiccation or "Black disc disease") 3. Pars defects 4. Spondylolisthesis, spondylosis, and/or spondyloarthropathies (include Degree/Grade of displacement in mm) 5. Vertebral body Fractures, including age (old, new/acute) 60. Modic Type Changes 7. Demineralization 8. Bone pathology 9. Central, Lateral Recess, and/or Foraminal Stenosis (include AP diameter of stenosis in mm) 10. Surgical changes (hardware type, status, and presence of fibrosis) NOTE: Please specify level(s) and laterality.    Standing Status:   Future    Standing Expiration Date:   06/04/2020    Scheduling Instructions:     Imaging must be done as soon as possible. Inform patient that order will expire within 30 days and I will not renew it.    Order Specific Question:   What is the patient's sedation requirement?    Answer:   No Sedation    Order Specific Question:   Does the patient have a pacemaker or implanted devices?    Answer:   No    Order Specific Question:   Preferred imaging location?    Answer:   [facility] (table limit-350lbs)    Order Specific Question:  Call Results- Best Contact Number?    Answer:   [phone] ([facility])    Order Specific Question:   Radiology Contrast Protocol - do NOT remove file path    Answer:   \[email or web address]   Follow-up plan:   Return for Procedure (w/ sedation): (R) CESI #3.      Interventional Pending:   Pending medical psychology evaluation for a lumbar spinal cord stimulator trial.  Referral sent done 04/12/2020.  The patient indicates having a virtual visit appointment in December  for that evaluation.  (Dr. Lyman Speller) Repeat MRI of the cervical spine as his symptoms have progressed in the right upper extremity and have not responded to a cervical epidural steroid injection x1. Today I will schedule the patient for a right cervical epidural steroid injection #3 in an attempt to see if we can get results similar to those that we attained with her very first cervical epidural done on 12/04/2018 which provided her with 100% relief of the pain in the left upper extremity symptoms.   Under consideration:   NOTE: (IODINE) SHELLFISH & CONTRAST ALLERGY!! (ANAPHYLACTIC)  Lumbar spinal cord stimulator trial     Palliative treatment options:   Therapeutic/palliative left lumbar sympathetic block #7 (last done 03/29/2020: 100/25/75/85) Diagnostic bilateral cervical facet block #2  Diagnostic bilateral Thoracic T7, T8, T9, & T10 Facet Medial Branch block #2 (80/100/100)  Diagnostic/therapeutic left CESI #2 (100/100/100)  Diagnostic/therapeutic right CESI #2  Palliative bilateral lumbar facet block #3  Palliative left lumbar facet RFA #2 (last done 05/19/2019)  Palliative right lumbar facet RFA #1 (last done 06/30/2019)     Recent Visits Date Type Provider Dept  04/19/20 Procedure visit Milinda Pointer, MD [facility]  04/12/20 Telemedicine Milinda Pointer, MD [facility]  03/29/20 Procedure visit Milinda Pointer, MD [facility]  02/16/20 Procedure visit Milinda Pointer, MD [facility]  Showing recent visits within past 90 days and meeting all other requirements Today's Visits Date Type Provider Dept  05/04/20 Telemedicine Milinda Pointer, MD [facility]  Showing today's visits and meeting all other requirements Future Appointments No visits were found meeting these conditions. Showing future appointments within next 90 days and meeting all other requirements  I discussed the assessment and treatment plan with the  patient. The patient was provided an opportunity to ask questions and all were answered. The patient agreed with the plan and demonstrated an understanding of the instructions.  Patient advised to call back or seek an in-person evaluation if the symptoms or condition worsens.  Duration of encounter: 30 minutes.  Note by: Gaspar Cola, MD Date: 05/04/2020; Time: 4:50 PM

## 2020-05-04 NOTE — Patient Instructions (Signed)
____________________________________________________________________________________________  Preparing for Procedure with Sedation  Procedure appointments are limited to planned procedures: . No Prescription Refills. . No disability issues will be discussed. . No medication changes will be discussed.  Instructions: . Oral Intake: Do not eat or drink anything for at least 8 hours prior to your procedure. (Exception: Blood Pressure Medication. See below.) . Transportation: Unless otherwise stated by your physician, you may drive yourself after the procedure. . Blood Pressure Medicine: Do not forget to take your blood pressure medicine with a sip of water the morning of the procedure. If your Diastolic (lower reading)is above 100 mmHg, elective cases will be cancelled/rescheduled. . Blood thinners: These will need to be stopped for procedures. Notify our staff if you are taking any blood thinners. Depending on which one you take, there will be specific instructions on how and when to stop it. . Diabetics on insulin: Notify the staff so that you can be scheduled 1st case in the morning. If your diabetes requires high dose insulin, take only  of your normal insulin dose the morning of the procedure and notify the staff that you have done so. . Preventing infections: Shower with an antibacterial soap the morning of your procedure. . Build-up your immune system: Take 1000 mg of Vitamin C with every meal (3 times a day) the day prior to your procedure. . Antibiotics: Inform the staff if you have a condition or reason that requires you to take antibiotics before dental procedures. . Pregnancy: If you are pregnant, call and cancel the procedure. . Sickness: If you have a cold, fever, or any active infections, call and cancel the procedure. . Arrival: You must be in the facility at least 30 minutes prior to your scheduled procedure. . Children: Do not bring children with you. . Dress appropriately:  Bring dark clothing that you would not mind if they get stained. . Valuables: Do not bring any jewelry or valuables.  Reasons to call and reschedule or cancel your procedure: (Following these recommendations will minimize the risk of a serious complication.) . Surgeries: Avoid having procedures within 2 weeks of any surgery. (Avoid for 2 weeks before or after any surgery). . Flu Shots: Avoid having procedures within 2 weeks of a flu shots or . (Avoid for 2 weeks before or after immunizations). . Barium: Avoid having a procedure within 7-10 days after having had a radiological study involving the use of radiological contrast. (Myelograms, Barium swallow or enema study). . Heart attacks: Avoid any elective procedures or surgeries for the initial 6 months after a "Myocardial Infarction" (Heart Attack). . Blood thinners: It is imperative that you stop these medications before procedures. Let us know if you if you take any blood thinner.  . Infection: Avoid procedures during or within two weeks of an infection (including chest colds or gastrointestinal problems). Symptoms associated with infections include: Localized redness, fever, chills, night sweats or profuse sweating, burning sensation when voiding, cough, congestion, stuffiness, runny nose, sore throat, diarrhea, nausea, vomiting, cold or Flu symptoms, recent or current infections. It is specially important if the infection is over the area that we intend to treat. . Heart and lung problems: Symptoms that may suggest an active cardiopulmonary problem include: cough, chest pain, breathing difficulties or shortness of breath, dizziness, ankle swelling, uncontrolled high or unusually low blood pressure, and/or palpitations. If you are experiencing any of these symptoms, cancel your procedure and contact your primary care physician for an evaluation.  Remember:  Regular Business hours are:    Monday to Thursday 8:00 AM to 4:00 PM  Provider's  Schedule: Bhavin Monjaraz, MD:  Procedure days: Tuesday and Thursday 7:30 AM to 4:00 PM  Bilal Lateef, MD:  Procedure days: Monday and Wednesday 7:30 AM to 4:00 PM ____________________________________________________________________________________________   ____________________________________________________________________________________________  General Risks and Possible Complications  Patient Responsibilities: It is important that you read this as it is part of your informed consent. It is our duty to inform you of the risks and possible complications associated with treatments offered to you. It is your responsibility as a patient to read this and to ask questions about anything that is not clear or that you believe was not covered in this document.  Patient's Rights: You have the right to refuse treatment. You also have the right to change your mind, even after initially having agreed to have the treatment done. However, under this last option, if you wait until the last second to change your mind, you may be charged for the materials used up to that point.  Introduction: Medicine is not an exact science. Everything in Medicine, including the lack of treatment(s), carries the potential for danger, harm, or loss (which is by definition: Risk). In Medicine, a complication is a secondary problem, condition, or disease that can aggravate an already existing one. All treatments carry the risk of possible complications. The fact that a side effects or complications occurs, does not imply that the treatment was conducted incorrectly. It must be clearly understood that these can happen even when everything is done following the highest safety standards.  No treatment: You can choose not to proceed with the proposed treatment alternative. The "PRO(s)" would include: avoiding the risk of complications associated with the therapy. The "CON(s)" would include: not getting any of the treatment  benefits. These benefits fall under one of three categories: diagnostic; therapeutic; and/or palliative. Diagnostic benefits include: getting information which can ultimately lead to improvement of the disease or symptom(s). Therapeutic benefits are those associated with the successful treatment of the disease. Finally, palliative benefits are those related to the decrease of the primary symptoms, without necessarily curing the condition (example: decreasing the pain from a flare-up of a chronic condition, such as incurable terminal cancer).  General Risks and Complications: These are associated to most interventional treatments. They can occur alone, or in combination. They fall under one of the following six (6) categories: no benefit or worsening of symptoms; bleeding; infection; nerve damage; allergic reactions; and/or death. 1. No benefits or worsening of symptoms: In Medicine there are no guarantees, only probabilities. No healthcare provider can ever guarantee that a medical treatment will work, they can only state the probability that it may. Furthermore, there is always the possibility that the condition may worsen, either directly, or indirectly, as a consequence of the treatment. 2. Bleeding: This is more common if the patient is taking a blood thinner, either prescription or over the counter (example: Goody Powders, Fish oil, Aspirin, Garlic, etc.), or if suffering a condition associated with impaired coagulation (example: Hemophilia, cirrhosis of the liver, low platelet counts, etc.). However, even if you do not have one on these, it can still happen. If you have any of these conditions, or take one of these drugs, make sure to notify your treating physician. 3. Infection: This is more common in patients with a compromised immune system, either due to disease (example: diabetes, cancer, human immunodeficiency virus [HIV], etc.), or due to medications or treatments (example: therapies used to treat  cancer and   rheumatological diseases). However, even if you do not have one on these, it can still happen. If you have any of these conditions, or take one of these drugs, make sure to notify your treating physician. 4. Nerve Damage: This is more common when the treatment is an invasive one, but it can also happen with the use of medications, such as those used in the treatment of cancer. The damage can occur to small secondary nerves, or to large primary ones, such as those in the spinal cord and brain. This damage may be temporary or permanent and it may lead to impairments that can range from temporary numbness to permanent paralysis and/or brain death. 5. Allergic Reactions: Any time a substance or material comes in contact with our body, there is the possibility of an allergic reaction. These can range from a mild skin rash (contact dermatitis) to a severe systemic reaction (anaphylactic reaction), which can result in death. 6. Death: In general, any medical intervention can result in death, most of the time due to an unforeseen complication. ____________________________________________________________________________________________   

## 2020-06-21 ENCOUNTER — Encounter: Payer: Self-pay | Admitting: Pain Medicine

## 2020-06-22 ENCOUNTER — Other Ambulatory Visit: Payer: Self-pay

## 2020-06-22 ENCOUNTER — Ambulatory Visit: Payer: Medicare Other | Attending: Pain Medicine | Admitting: Pain Medicine

## 2020-06-22 DIAGNOSIS — M47816 Spondylosis without myelopathy or radiculopathy, lumbar region: Secondary | ICD-10-CM

## 2020-06-22 DIAGNOSIS — M5136 Other intervertebral disc degeneration, lumbar region: Secondary | ICD-10-CM

## 2020-06-22 DIAGNOSIS — M48061 Spinal stenosis, lumbar region without neurogenic claudication: Secondary | ICD-10-CM

## 2020-06-22 DIAGNOSIS — M4310 Spondylolisthesis, site unspecified: Secondary | ICD-10-CM | POA: Diagnosis not present

## 2020-06-22 DIAGNOSIS — M4807 Spinal stenosis, lumbosacral region: Secondary | ICD-10-CM

## 2020-06-22 DIAGNOSIS — G894 Chronic pain syndrome: Secondary | ICD-10-CM

## 2020-06-22 DIAGNOSIS — M5127 Other intervertebral disc displacement, lumbosacral region: Secondary | ICD-10-CM | POA: Diagnosis not present

## 2020-06-22 DIAGNOSIS — R937 Abnormal findings on diagnostic imaging of other parts of musculoskeletal system: Secondary | ICD-10-CM

## 2020-06-22 DIAGNOSIS — M51369 Other intervertebral disc degeneration, lumbar region without mention of lumbar back pain or lower extremity pain: Secondary | ICD-10-CM

## 2020-06-22 DIAGNOSIS — M5134 Other intervertebral disc degeneration, thoracic region: Secondary | ICD-10-CM

## 2020-06-22 NOTE — Progress Notes (Signed)
Patient: Samantha Macias  Service Category: E/M  Provider: Gaspar Cola, MD  DOB: [birth year]  DOS: 06/22/2020  Location: Office  MRN: [id number]  Setting: Ambulatory outpatient  Referring Provider: Tracie Harrier, MD  Type: Established Patient  Specialty: Interventional Pain Management  PCP: Tracie Harrier, MD  Location: Remote location  Delivery: TeleHealth     Virtual Encounter - Pain Management PROVIDER NOTE: Information contained herein reflects review and annotations entered in association with encounter. Interpretation of such information and data should be left to medically-trained personnel. Information provided to patient can be located elsewhere in the medical record under "Patient Instructions". Document created using STT-dictation technology, any transcriptional errors that may result from process are unintentional.    Contact & Pharmacy Preferred: [phone] Home: [phone] (home) Mobile: [phone] (mobile) E-mail: [email or web address]  [facility], [address] [place] [address] Phone: [phone] Fax: [phone]   Pre-screening  Samantha Macias offered "in-person" vs "virtual" encounter. She indicated preferring virtual for this encounter.   Reason COVID-19*  Social distancing based on CDC and AMA recommendations.   I contacted Samantha Macias on 06/22/2020 via telephone.      I clearly identified myself as Gaspar Cola, MD. I verified that I was speaking with the correct person using two identifiers (Name: Samantha Macias, and date of birth: [birth year]).  Consent I sought verbal advanced consent from Samantha Macias for virtual visit interactions. I informed Samantha Macias of possible security and privacy concerns, risks, and limitations associated with providing "not-in-person" medical evaluation and management services. I also informed Samantha Macias of the availability of "in-person" appointments.  Finally, I informed her that there would be a charge for the virtual visit and that she could be  personally, fully or partially, financially responsible for it. Samantha Macias expressed understanding and agreed to proceed.   Historic Elements   Samantha Macias is a 60 y.o. year old, female patient evaluated today after our last contact on 04/19/2020. Samantha Macias  has a past medical history of Abnormal gait, Allergy, Anxiety, Arthritis, Back pain, Bipolar disorder ([place]), CAD (coronary artery disease), Chronic kidney disease, Collagen vascular disease ([place]), Colon polyp, COPD (chronic obstructive pulmonary disease) ([place]), Falls, Fibromyalgia, GERD (gastroesophageal reflux disease), Hyperlipidemia, Hypertension, IBS (irritable bowel syndrome), Kidney disease, Migraine, Numbness and tingling, Renal insufficiency, Spinal stenosis, Static encephalopathy, Thyroid nodule (07/2017), and TIA (transient ischemic attack). She also  has a past surgical history that includes Tubal ligation; Nasal sinus surgery; Colonoscopy with propofol (N/A, 04/01/2017); Esophagogastroduodenoscopy (egd) with propofol (N/A, 04/01/2017); and Mouth surgery. Samantha Macias has a current medication list which includes the following prescription(s): albuterol, alprazolam, bupropion, diurex, vitamin d, cyanocobalamin, diclofenac sodium, tylenol pm extra strength, donepezil, hydrocodone-acetaminophen, losartan-hydrochlorothiazide, meloxicam, multiple vitamins-minerals, pantoprazole, pregabalin, rybelsus, rybelsus, sertraline, spiriva handihaler, temazepam, tizanidine, and anoro ellipta. She  reports that she has been smoking cigarettes. She has a 6.25 pack-year smoking history. She has never used smokeless tobacco. She reports current drug use. Drug: Cocaine. No history on file for alcohol use. Samantha Macias is allergic to contrast media [iodinated diagnostic agents], shellfish allergy, tetanus toxoid adsorbed, ace inhibitors, codeine, nitrofurantoin  monohyd macro, and red dye.   HPI  Today, she is being contacted for follow-up evaluation after medical psychology evaluation for spinal cord stimulator trial.  According to the evaluation by Dr. Lyman Speller, she is cleared for the trial, but needs to be evaluated by him again during the trial.  To  assess whether or not clearance for implantation is warranted.  Today I had a very long conversation with the patient regarding the spinal cord stimulator trial.  The first thing that the patient wanted to know was the reason for the medical psychology evaluation.  I went ahead and explained that to the patient to the best of my abilities.  I did not proceeded to explain to the patient the process of doing the trial and I walked her through the procedure and process.  I also went ahead and answered all the patient's questions to the best of my abilities.  Today I will be requesting approval for a bilateral lumbar spinal cord stimulator trial implant, as well as a thoracic CT scan.  The purpose of this preoperative scan is to evaluate the intraspinal canal within the thoracic region were the neurostimulator electrodes would be placed.  In essence, we are looking to see if there are any possible problems or contraindications such as severe thoracic spinal stenosis.  Pharmacotherapy Assessment  Analgesic: None provided by our practice. NO OPIOIDS: UDS (08/07/17) (+) for Unreported Benzoylecgonine, a metabolite of cocaine; its presence    indicates use of this drug.  Source is most commonly illicit.   Monitoring: [place] PMP: PDMP reviewed during this encounter.       Pharmacotherapy: No side-effects or adverse reactions reported. Compliance: No problems identified. Effectiveness: Clinically acceptable. Plan: Refer to "POC".  UDS:  Summary  Date Value Ref Range Status  03/17/2018 FINAL  Final    Comment:    ==================================================================== [facility] COMP DRUG  ANALYSIS,UR ==================================================================== Test                             Result       Flag       Units Drug Present and Declared for Prescription Verification   Tramadol                       1825         EXPECTED   ng/mg creat   O-Desmethyltramadol            929          EXPECTED   ng/mg creat   N-Desmethyltramadol            1352         EXPECTED   ng/mg creat    Source of tramadol is a prescription medication.    O-desmethyltramadol and N-desmethyltramadol are expected    metabolites of tramadol.   Pregabalin                     PRESENT      EXPECTED   Tizanidine                     PRESENT      EXPECTED   Bupropion                      PRESENT      EXPECTED   Hydroxybupropion               PRESENT      EXPECTED    Hydroxybupropion is an expected metabolite of bupropion.   Mirtazapine                    PRESENT      EXPECTED   Quetiapine  PRESENT      EXPECTED   Salicylate                     PRESENT      EXPECTED Drug Present not Declared for Prescription Verification   Benzoylecgonine                494          UNEXPECTED ng/mg creat    Benzoylecgonine is a metabolite of cocaine; its presence    indicates use of this drug.  Source is most commonly illicit, but    cocaine is present in some topical anesthetic solutions.   Acetaminophen                  PRESENT      UNEXPECTED   Diphenhydramine                PRESENT      UNEXPECTED Drug Absent but Declared for Prescription Verification   Alprazolam                     Not Detected UNEXPECTED ng/mg creat   Sertraline                     Not Detected UNEXPECTED   Diclofenac                     Not Detected UNEXPECTED    Topical diclofenac, as indicated in the declared medication list,    is not always detected even when used as directed.   Ibuprofen                      Not Detected UNEXPECTED    Ibuprofen, as indicated in the declared medication list, is not    always  detected even when used as directed. ==================================================================== Test                      Result    Flag   Units      Ref Range   Creatinine              159              mg/dL      >=20 ==================================================================== Declared Medications:  The flagging and interpretation on this report are based on the  following declared medications.  Unexpected results may arise from  inaccuracies in the declared medications.  **Note: The testing scope of this panel includes these medications:  Alprazolam (Xanax)  Bupropion (Wellbutrin)  Mirtazapine (Remeron)  Pregabalin (Lyrica)  Quetiapine (Seroquel)  Sertraline (Zoloft)  Tramadol (Ultram)  **Note: The testing scope of this panel does not include small to  moderate amounts of these reported medications:  Aspirin (Aspirin 81)  Ibuprofen  Tizanidine (Zanaflex)  Topical Diclofenac  **Note: The testing scope of this panel does not include following  reported medications:  Albuterol  Donepezil (Aricept)  Formoterol (Perforomist)  Hydrochlorothiazide (Hyzaar)  Ipratropium  Losartan (Hyzaar)  Omeprazole (Prilosec)  Pantoprazole (Protonix)  Ramelteon (Rozerem)  Tiotropium (Spiriva)  Vitamin D ==================================================================== For clinical consultation, please call [phone]. ====================================================================     Laboratory Chemistry Profile   Renal Lab Results  Component Value Date   BUN 22 03/17/2018   CREATININE 1.35 (H) 03/17/2018   BCR 16 03/17/2018   GFRAA 51 (L) 03/17/2018   GFRNONAA 44 (L) 03/17/2018     Hepatic  Lab Results  Component Value Date   AST 13 03/17/2018   ALT 15 12/24/2017   ALBUMIN 4.4 03/17/2018   ALKPHOS 148 (H) 03/17/2018   LIPASE 37 12/24/2017   AMMONIA 23 07/07/2015     Electrolytes Lab Results  Component Value Date   NA 141 03/17/2018   K  4.3 03/17/2018   CL 99 03/17/2018   CALCIUM 9.1 03/17/2018   MG 1.8 03/17/2018     Bone Lab Results  Component Value Date   25OHVITD1 41 03/17/2018   25OHVITD2 <1.0 03/17/2018   25OHVITD3 41 03/17/2018     Inflammation (CRP: Acute Phase) (ESR: Chronic Phase) Lab Results  Component Value Date   CRP 17 (H) 03/17/2018   ESRSEDRATE 28 03/17/2018       Note: Above Lab results reviewed.  Imaging  DG PAIN CLINIC C-ARM 1-60 MIN NO REPORT Fluoro was used, but no Radiologist interpretation will be provided.  Please refer to "NOTES" tab for provider progress note.  Assessment  The primary encounter diagnosis was Chronic pain syndrome. 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  Plan of Care  Problem-specific:  No problem-specific Assessment & Plan notes found for this encounter.  Samantha Macias has a current medication list which includes the following long-term medication(s): albuterol, bupropion, tylenol pm extra strength, donepezil, losartan-hydrochlorothiazide, pantoprazole, pregabalin, sertraline, spiriva handihaler, and temazepam.  Pharmacotherapy (Medications Ordered): No orders of the defined types were placed in this encounter.  Orders:  Orders Placed This Encounter  Procedures  . [facility] representative to notify them of the scheduled case and to make sure they will be available to provide required equipment.    Standing Status:   Future    Standing Expiration Date:   12/20/2020    Scheduling Instructions:     Side: Bilateral      Level: Lumbar     Device: Medtronic     Sedation: With sedation     Timeframe: As soon as pre-approved    Order Specific Question:   Where will this procedure be performed?    Answer:   [facility]  . CT THORACIC SPINE WO CONTRAST    Preoperative evaluation looking for areas of thoracic central spinal stenosis that may prevent the implant of percutaneous spinal cord neurostimulator leads.    Standing Status:   Future    Standing Expiration Date:   07/23/2020    Order Specific Question:   Is patient pregnant?    Answer:   No    Order Specific Question:   Preferred imaging location?    Answer:   [facility]    Order Specific Question:   Call Results- Best Contact Number?    Answer:   [phone] ([facility])    Order Specific Question:   Radiology Contrast Protocol - do NOT remove file path    Answer:   [email or web address]   Follow-up plan:   Return for (40-min), Procedure (w/ sedation): (B) Thoracolumbar SCS trial implant.      Interventional Pending:   Pending medical psychology evaluation for a lumbar spinal cord stimulator trial.  Referral sent done 04/12/2020.  The patient indicates having a  virtual visit appointment in December for that evaluation.  (Dr. Lyman Speller) Repeat MRI of the cervical spine as his symptoms have progressed in the right upper extremity and have not responded to a cervical epidural steroid injection x1. Today I will schedule the patient for a right cervical epidural steroid injection #3 in an attempt to see if we can get results similar to those that we attained with her very first cervical epidural done on 12/04/2018 which provided her with 100% relief of the pain in the left upper extremity symptoms.   Under consideration:   NOTE: (IODINE) SHELLFISH & CONTRAST ALLERGY!! (ANAPHYLACTIC)  Lumbar spinal cord stimulator trial     Palliative treatment options:   Therapeutic/palliative left lumbar sympathetic block #7  (last done 03/29/2020: 100/25/75/85) Diagnostic bilateral cervical facet block #2  Diagnostic bilateral Thoracic T7, T8, T9, & T10 Facet Medial Branch block #2 (80/100/100)  Diagnostic/therapeutic left CESI #2 (100/100/100)  Diagnostic/therapeutic right CESI #2  Palliative bilateral lumbar facet block #3  Palliative left lumbar facet RFA #2 (last done 05/19/2019)  Palliative right lumbar facet RFA #1 (last done 06/30/2019)     Recent Visits Date Type Provider Dept  05/04/20 Telemedicine Milinda Pointer, [facility]  04/19/20 Procedure visit Milinda Pointer, MD [facility]  04/12/20 Telemedicine Milinda Pointer, MD [facility]  03/29/20 Procedure visit Milinda Pointer, MD [facility]  Showing recent visits within past 90 days and meeting all other requirements Today's Visits Date Type Provider Dept  06/22/20 Telemedicine Milinda Pointer, MD [facility]  Showing today's visits and meeting all other requirements Future Appointments No visits were found meeting these conditions. Showing future appointments within next 90 days and meeting all other requirements  I discussed the assessment and treatment plan with the patient. The patient was provided an opportunity to ask questions and all were answered. The patient agreed with the plan and demonstrated an understanding of the instructions.  Patient advised to call back or seek an in-person evaluation if the symptoms or condition worsens.  Duration of encounter: 25 minutes.  Note by: Gaspar Cola, MD Date: 06/22/2020; Time: 4:58 PM

## 2020-06-22 NOTE — Patient Instructions (Addendum)
Spinal Cord Stimulator Implantation  A spinal cord stimulator is a small device that makes electrical signals and sends them through wires (leads) to nerves in the spinal cord. This stimulates the nerves and can help relieve long-term (chronic) pain in the back, legs, or arms. Implantation is a procedure to place this device under the skin. This procedure is done if a temporary spinal cord stimulator effectively reduces your pain during a trial period. A spinal cord stimulator may have an electrical pulse generator, a battery, and leads. It may also come with a small remote that you can control. Stimulators with a rechargeable battery may last up to 10 years. Stimulators with a non-rechargeable battery may last 2-5 years before being replaced. Tell a health care provider about:  Any allergies you have.  All medicines you are taking, including vitamins, herbs, eye drops, creams, and over-the-counter medicines.  Any problems you or family members have had with anesthetic medicines.  Any blood disorders you have.  Any surgeries you have had.  Any medical conditions you have.  Whether you are pregnant or may be pregnant. What are the risks? Generally, this is a safe procedure. However, problems may occur, including:  Infection.  Bleeding.  Allergic reactions to medicines, devices, or dyes.  Damage to the skin where the battery is placed, or damage to nerves, back muscles, or the spinal cord.  The device or battery failing or not working.  A lead moving out of place.  Inability to move (paralysis).  A pocket of clear fluid forming under the skin (seroma).  Spinal fluid leakage.  Numbness.  Inability to control when you urinate or have a bowel movement (incontinence). What happens before the procedure? Staying hydrated Follow instructions from your health care provider about hydration, which may include:  Up to 2 hours before the procedure - you may continue to drink clear  liquids, such as water, clear fruit juice, black coffee, and plain tea. Eating and drinking restrictions Follow instructions from your health care provider about eating and drinking, which may include:  8 hours before the procedure - stop eating heavy meals or foods such as meat, fried foods, or fatty foods.  6 hours before the procedure - stop eating light meals or foods, such as toast or cereal.  6 hours before the procedure - stop drinking milk or drinks that contain milk.  2 hours before the procedure - stop drinking clear liquids. Medicines  Ask your health care provider about: ? Changing or stopping your regular medicines. This is especially important if you are taking diabetes medicines or blood thinners. ? Taking medicines such as aspirin and ibuprofen. These medicines can thin your blood. Do not take these medicines unless your health care provider tells you to take them. ? Taking over-the-counter medicines, vitamins, herbs, and supplements. General instructions  You may be asked to shower with a germ-killing soap.  Do not use any products that contain nicotine or tobacco, such as cigarettes and e-cigarettes. If you need help quitting, ask your health care provider.  You will have blood tests and physical exams.  You may have chest X-rays.  You will have an electrocardiogram (ECG) to check the electrical patterns and rhythms of your heart.  Plan to have someone take you home from the hospital or clinic.  Plan to have a responsible adult care for you for at least 24 hours after you leave the hospital or clinic. This is important.  Ask your health care provider what steps will  be taken to help prevent infection. These may include: ? Removing hair at the surgery site. ? Washing skin with a germ-killing soap. ? Antibiotic medicine. What happens during the procedure?  An IV will be inserted into one of your veins.  You will be given one or more of the following: ? A  medicine to help you relax (sedative). ? A medicine to numb the area (local anesthetic). ? A medicine to make you fall asleep (general anesthetic).  Dye may be injected into your spinal canal (epidural space) to make it easier to see on X-rays. X-rays may be done during the procedure to help implant the stimulator.  A small incision will be made in your back.  A small piece of bone may be removed from your spine to make room for the device.  Leads will be placed near your spinal cord. X-rays may be done to make sure they are in the right place.  The leads will be tested with stimulation, and you will be asked to react to the tests. If you are under general anesthetic, you will be woken up for this part, and then given more medicine to make you fall asleep again.  The leads will all be joined to one lead that connects to the pulse generator (lead wire).  The lead wire will be placed under your skin so that it leads from your spine to your abdomen or buttocks.  A small incision will be made in your abdomen or buttocks.  The pulse generator will be placed in either your abdomen or buttocks and connected to the lead wire. A small pocket of skin will be formed around it.  Your incisions will be closed with stitches (sutures).  Your incisions will be covered with bandages (dressings). The procedure may vary among health care providers and hospitals. What happens after the procedure?  Your blood pressure, heart rate, breathing rate, and blood oxygen level will be monitored until you leave the hospital or clinic.  You will be given pain medicine as needed.  You will have to drink fluids.  Your pulse generator may be programmed.  Do not drive until your health care provider approves.  You may have to stay in the hospital after the procedure. Ask your health care provider how long you will stay. Summary  A spinal cord stimulator sends electrical pulses through the leads to the spinal  cord. This can relieve pain.  A spinal cord stimulator may have an electrical pulse generator, a battery, and a small remote that you can control.  Plan to have a responsible adult care for you for at least 24 hours after you leave the hospital or clinic. This information is not intended to replace advice given to you by your health care provider. Make sure you discuss any questions you have with your health care provider. Document Revised: 12/19/2018 Document Reviewed: 07/04/2017 Elsevier Patient Education  2021 [place].   ____________________________________________________________________________________________  General Risks and Possible Complications  Patient Responsibilities: It is important that you read this as it is part of your informed consent. It is our duty to inform you of the risks and possible complications associated with treatments offered to you. It is your responsibility as a patient to read this and to ask questions about anything that is not clear or that you believe was not covered in this document.  Patient's Rights: You have the right to refuse treatment. You also have the right to change your mind, even after initially  having agreed to have the treatment done. However, under this last option, if you wait until the last second to change your mind, you may be charged for the materials used up to that point.  Introduction: Medicine is not an Chief Strategy Officer. Everything in Medicine, including the lack of treatment(s), carries the potential for danger, harm, or loss (which is by definition: Risk). In Medicine, a complication is a secondary problem, condition, or disease that can aggravate an already existing one. All treatments carry the risk of possible complications. The fact that a side effects or complications occurs, does not imply that the treatment was conducted incorrectly. It must be clearly understood that these can happen even when everything is done following  the highest safety standards.  No treatment: You can choose not to proceed with the proposed treatment alternative. The "PRO(s)" would include: avoiding the risk of complications associated with the therapy. The "CON(s)" would include: not getting any of the treatment benefits. These benefits fall under one of three categories: diagnostic; therapeutic; and/or palliative. Diagnostic benefits include: getting information which can ultimately lead to improvement of the disease or symptom(s). Therapeutic benefits are those associated with the successful treatment of the disease. Finally, palliative benefits are those related to the decrease of the primary symptoms, without necessarily curing the condition (example: decreasing the pain from a flare-up of a chronic condition, such as incurable terminal cancer).  General Risks and Complications: These are associated to most interventional treatments. They can occur alone, or in combination. They fall under one of the following six (6) categories: no benefit or worsening of symptoms; bleeding; infection; nerve damage; allergic reactions; and/or death. 1. No benefits or worsening of symptoms: In Medicine there are no guarantees, only probabilities. No healthcare provider can ever guarantee that a medical treatment will work, they can only state the probability that it may. Furthermore, there is always the possibility that the condition may worsen, either directly, or indirectly, as a consequence of the treatment. 2. Bleeding: This is more common if the patient is taking a blood thinner, either prescription or over the counter (example: Goody Powders, Fish oil, Aspirin, Garlic, etc.), or if suffering a condition associated with impaired coagulation (example: Hemophilia, cirrhosis of the liver, low platelet counts, etc.). However, even if you do not have one on these, it can still happen. If you have any of these conditions, or take one of these drugs, make sure to  notify your treating physician. 3. Infection: This is more common in patients with a compromised immune system, either due to disease (example: diabetes, cancer, human immunodeficiency virus [HIV], etc.), or due to medications or treatments (example: therapies used to treat cancer and rheumatological diseases). However, even if you do not have one on these, it can still happen. If you have any of these conditions, or take one of these drugs, make sure to notify your treating physician. 4. Nerve Damage: This is more common when the treatment is an invasive one, but it can also happen with the use of medications, such as those used in the treatment of cancer. The damage can occur to small secondary nerves, or to large primary ones, such as those in the spinal cord and brain. This damage may be temporary or permanent and it may lead to impairments that can range from temporary numbness to permanent paralysis and/or brain death. 5. Allergic Reactions: Any time a substance or material comes in contact with our body, there is the possibility of an allergic reaction. These  can range from a mild skin rash (contact dermatitis) to a severe systemic reaction (anaphylactic reaction), which can result in death. 6. Death: In general, any medical intervention can result in death, most of the time due to an unforeseen complication. ____________________________________________________________________________________________  ____________________________________________________________________________________________  Preparing for Procedure with Sedation  Procedure appointments are limited to planned procedures: . No Prescription Refills. . No disability issues will be discussed. . No medication changes will be discussed.  Instructions: . Oral Intake: Do not eat or drink anything for at least 8 hours prior to your procedure. (Exception: Blood Pressure Medication. See below.) . Transportation: Unless otherwise  stated by your physician, you may drive yourself after the procedure. . Blood Pressure Medicine: Do not forget to take your blood pressure medicine with a sip of water the morning of the procedure. If your Diastolic (lower reading)is above 100 mmHg, elective cases will be cancelled/rescheduled. . Blood thinners: These will need to be stopped for procedures. Notify our staff if you are taking any blood thinners. Depending on which one you take, there will be specific instructions on how and when to stop it. . Diabetics on insulin: Notify the staff so that you can be scheduled 1st case in the morning. If your diabetes requires high dose insulin, take only  of your normal insulin dose the morning of the procedure and notify the staff that you have done so. . Preventing infections: Shower with an antibacterial soap the morning of your procedure. . Build-up your immune system: Take 1000 mg of Vitamin C with every meal (3 times a day) the day prior to your procedure. Marland Kitchen Antibiotics: Inform the staff if you have a condition or reason that requires you to take antibiotics before dental procedures. . Pregnancy: If you are pregnant, call and cancel the procedure. . Sickness: If you have a cold, fever, or any active infections, call and cancel the procedure. . Arrival: You must be in the facility at least 30 minutes prior to your scheduled procedure. . Children: Do not bring children with you. . Dress appropriately: Bring dark clothing that you would not mind if they get stained. . Valuables: Do not bring any jewelry or valuables.  Reasons to call and reschedule or cancel your procedure: (Following these recommendations will minimize the risk of a serious complication.) . Surgeries: Avoid having procedures within 2 weeks of any surgery. (Avoid for 2 weeks before or after any surgery). . Flu Shots: Avoid having procedures within 2 weeks of a flu shots or . (Avoid for 2 weeks before or after  immunizations). . Barium: Avoid having a procedure within 7-10 days after having had a radiological study involving the use of radiological contrast. (Myelograms, Barium swallow or enema study). . Heart attacks: Avoid any elective procedures or surgeries for the initial 6 months after a "Myocardial Infarction" (Heart Attack). . Blood thinners: It is imperative that you stop these medications before procedures. Let us know if you if you take any blood thinner.  . Infection: Avoid procedures during or within two weeks of an infection (including chest colds or gastrointestinal problems). Symptoms associated with infections include: Localized redness, fever, chills, night sweats or profuse sweating, burning sensation when voiding, cough, congestion, stuffiness, runny nose, sore throat, diarrhea, nausea, vomiting, cold or Flu symptoms, recent or current infections. It is specially important if the infection is over the area that we intend to treat. Marland Kitchen Heart and lung problems: Symptoms that may suggest an active cardiopulmonary problem include: cough, chest pain, breathing  difficulties or shortness of breath, dizziness, ankle swelling, uncontrolled high or unusually low blood pressure, and/or palpitations. If you are experiencing any of these symptoms, cancel your procedure and contact your primary care physician for an evaluation.  Remember:  Regular Business hours are:  Monday to Thursday 8:00 AM to 4:00 PM  Provider's Schedule: Milinda Pointer, MD:  Procedure days: Tuesday and Thursday 7:30 AM to 4:00 PM  Gillis Santa, MD:  Procedure days: Monday and Wednesday 7:30 AM to 4:00 PM ____________________________________________________________________________________________

## 2020-07-12 ENCOUNTER — Ambulatory Visit
Admission: RE | Admit: 2020-07-12 | Discharge: 2020-07-12 | Disposition: A | Discharge status: Home / Self Care | Payer: Medicare Other | Source: Ambulatory Visit | Attending: Pain Medicine | Admitting: Pain Medicine

## 2020-07-12 ENCOUNTER — Other Ambulatory Visit: Payer: Self-pay

## 2020-07-12 DIAGNOSIS — M5134 Other intervertebral disc degeneration, thoracic region: Secondary | ICD-10-CM | POA: Insufficient documentation

## 2020-07-13 ENCOUNTER — Encounter: Payer: Self-pay | Admitting: Pain Medicine

## 2020-08-13 NOTE — Progress Notes (Signed)
PROVIDER NOTE: Information contained herein reflects review and annotations entered in association with encounter. Interpretation of such information and data should be left to medically-trained personnel. Information provided to patient can be located elsewhere in the medical record under "Patient Instructions". Document created using STT-dictation technology, any transcriptional errors that may result from process are unintentional.    Patient: Samantha Macias  Service Category: Procedure  Provider: Gaspar Cola, MD  DOB: Jan 23, 1961  DOS: 08/18/2020  Location: [facility]  MRN: [id number]  Setting: Ambulatory - outpatient  Referring Provider: Milinda Pointer, MD  Type: Established Patient  Specialty: Interventional Pain Management  PCP: Tracie Harrier, MD   Primary Reason for Admission: Surgical management of chronic pain condition.  Procedure:  Anesthesia, Analgesia, Anxiolysis:  Type: Trial Spinal Cord Neurostimulator Implant (Percutaneous, interlaminar, posterior epidural placement) Purpose: To determine if a permanent implant may be effective in controlling some or all of Samantha Macias's chronic pain symptoms.  Region: Lumbar Level: L1-2 Laterality: Bilateral Paramedial  Type: Moderate (Conscious) Sedation combined with Local Anesthesia Indication(s): Analgesia and Anxiety Route: Intravenous (IV) IV Access: Secured Sedation: Meaningful verbal contact was maintained at all times during the procedure  Local Anesthetic: Lidocaine 1-2%   Indications: 1. Chronic pain syndrome   2. Chronic low back pain (1ry area of Pain) (Bilateral) (L>R) w/ sciatica   3. Chronic lower extremity pain (2ry area of Pain) (Bilateral) (L>R)   4. CRPS 1 (complex regional pain syndrome I) of lower limb (Left)   5. DDD (degenerative disc disease), lumbar   6. Grade 1 Retrolisthesis of L5/S1   7. 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 Lumbar central  spinal stenosis (L4-5)   9. 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 (year old), female patient, seen today for interventional treatment. She  has a past surgical history that includes Tubal ligation; Nasal sinus surgery; Colonoscopy with propofol (N/A, 04/01/2017); Esophagogastroduodenoscopy (egd) with propofol (N/A, 04/01/2017); and Mouth surgery.  Initial Vital Signs:  Pulse/EKG Rate: 90ECG Heart Rate: 93 Temp: (!) 97.3 F (36.3 C) Resp: 18 BP: 118/78 SpO2: 97 %  BMI: Estimated body mass index is 27.98 kg/m as calculated from the following:   Height as of this encounter: 5\' 8"  (1.727 m).   Weight as of this encounter: 184 lb (83.5 kg).  Risk Assessment: Allergies: Reviewed. She is allergic to contrast media [iodinated diagnostic agents], shellfish allergy, tetanus toxoid adsorbed, ace inhibitors, codeine, nitrofurantoin monohyd macro, and red dye.  Allergy Precautions: None required Coagulopathies: Reviewed. None identified.  Blood-thinner therapy: None at this time Active Infection(s): Reviewed. None identified. Samantha Macias is afebrile  Site Confirmation: Samantha Macias was asked to confirm the procedure and laterality before marking the site, which she did. Procedure checklist: Completed Consent: Before the procedure and under the influence of no sedative(s), amnesic(s), or anxiolytics, the patient was informed of the treatment options, risks and possible complications. To fulfill our ethical and legal obligations, as recommended by the American Medical Association's Code of Ethics, I have informed the patient of my clinical impression; the nature and purpose of the treatment or procedure; the risks, benefits, and possible complications of the intervention;  the alternatives, including doing nothing; the risk(s)  and benefit(s) of the alternative treatment(s) or procedure(s); and the risk(s) and benefit(s) of doing nothing.  Samantha Macias was provided with information about the general risks and possible complications associated with most interventional procedures. These include, but are not limited to: failure to achieve desired goals, infection, bleeding, organ or nerve damage, allergic reactions, paralysis, and/or death.  In addition, she was informed of those risks and possible complications associated to this particular procedure, which include, but are not limited to: damage to the implant; failure to decrease pain; local, systemic, or serious CNS infections, intraspinal abscess with possible cord compression and paralysis, or life-threatening such as meningitis; intrathecal and/or epidural bleeding with formation of hematoma with possible spinal cord compression and permanent paralysis; organ damage; nerve injury or damage with subsequent sensory, motor, and/or autonomic system dysfunction, resulting in transient or permanent pain, numbness, and/or weakness of one or several areas of the body; allergic reactions, either minor or major life-threatening, such as anaphylactic or anaphylactoid reactions.  Furthermore, Samantha Macias was informed of those risks and complications associated with the medications. These include, but are not limited to: allergic reactions (i.e.: anaphylactic or anaphylactoid reactions); arrhythmia;  Hypotension/hypertension; cardiovascular collapse; respiratory depression and/or shortness of breath; swelling or edema; medication-induced neural toxicity; particulate matter embolism and blood vessel occlusion with resultant organ, and/or nervous system infarction and permanent paralysis.  Finally, she was informed that Medicine is not an exact science; therefore, there is also the possibility of unforeseen or unpredictable risks and/or  possible complications that may result in a catastrophic outcome. The patient indicated having understood very clearly. We have given the patient no guarantees and we have made no promises. Enough time was given to the patient to ask questions, all of which were answered to the patient's satisfaction. Ms. Val has indicated that she wanted to continue with the procedure. Attestation: I, the ordering provider, attest that I have discussed with the patient the benefits, risks, side-effects, alternatives, likelihood of achieving goals, and potential problems during recovery for the procedure that I have provided informed consent. Date   Time: 08/18/2020  7:54 AM  Pre-Procedure Preparation:  Monitoring: As per clinic protocol. Respiration, ETCO2, SpO2, BP, heart rate and rhythm monitor placed and checked for adequate function Safety Precautions: Patient was assessed for positional comfort and pressure points before starting the procedure. Time-out: I initiated and conducted the "Time-out" before starting the procedure, as per protocol. The patient was asked to participate by confirming the accuracy of the "Time Out" information. Verification of the correct person, site, and procedure were performed and confirmed by me, the nursing staff, and the patient. "Time-out" conducted as per Joint Commission's Universal Protocol (UP.01.01.01). Time: 0835  Description of Procedure Process:   Position: Prone Target Area: Posterior epidural space Approach: Posterior percutaneous, paramedial, interlaminar approach Area Prepped: Bilateral thoraco-lumbar Region Prepping solution: ChloraPrep (2% chlorhexidine gluconate and 70% isopropyl alcohol) Safety Precautions: Safe injection practices and needle disposal techniques used. Medications properly checked for expiration dates. SDV (single dose vial) medications used. Aspiration looking for blood return and/or CSF was conducted prior to all injections. At no point did I  inject any substances, as a needle was being advanced. No attempts were made at seeking any paresthesias.  Description of the Procedure: Availability of a responsible, adult driver, and NPO status confirmed. Informed consent was obtained after having discussed risks and possible complications. An IV was started. The patient was then taken to the fluoroscopy suite, where the patient was placed in position for  the procedure, over the fluoroscopy table. The patient was then monitored in the usual manner. Fluoroscopy was manipulated to obtain the best possible view of the target. Parallex error was corrected before commencing the procedure. Once a clear view of the target had been obtained, the skin and deeper tissues over the procedure site were infiltrated using lidocaine, loaded in a 10 cc luer-loc syringe with a 0.5 inch, 25-G needle. The introducer needle(s) was/were then inserted through the skin and deeper tissues. A paramidline approach was used to enter the posterior epidural space at a 30 angle, using Loss-of-resistance Technique with 3 ml of PF-NaCl (0.9% NSS). Correct needle placement was confirmed in the antero-posterior and lateral fluoroscopic views. The lead was gently introduced and manipulated under real-time fluoroscopy, constantly assessing for pain, discomfort, or paresthesias, until the tip rested at the desired level. Both sides were done in identical fashion. Electrode placement was tested until appropriate coverage was attained. Once the patient confirmed that the stimulation was over the desired area, the lead(s) was/were secured in place and the introducer needles removed. This was done under real-time fluoroscopy while observing the electrode tip to avoid unintended migration. The area was covered with a non-occlusive dressing and the patient transported to recovery for further programming.  Vitals:   08/18/20 0947 08/18/20 0958 08/18/20 1007 08/18/20 1017  BP: 126/86 (!) 127/91   (!) 121/91  Pulse:      Resp: 16 (!) 22 18 17   Temp: (!) 97.5 F (36.4 C)   97.6 F (36.4 C)  TempSrc: Temporal   Temporal  SpO2: 100% 100% 99% 100%  Weight:      Height:       Start Time: 0835 hrs. End Time: 0935 hrs.  Neurostimulator Details:   Lead(s):  Brand: Medtronic Epidural Access Level:  T12-L1 T12-L1  Lead implant:  Bilateral   No. of Electrodes/Lead:  8 8  Laterality:  Left Right  Top electrode location:  T8 T8  Bottom electrode location:  T10 T10  Model No.: [id number] Same  Length: 60 cm Same  Lot No.: [id number]  MRI compatibility:  Yes Yes  External Neurostimulator    Model No.: [id number]   Serial No.: [id number] N    Imaging Guidance (Spinal):          Type of Imaging Technique: Fluoroscopy Guidance (Spinal) Indication(s): Assistance in needle guidance and placement for procedures requiring needle placement in or near specific anatomical locations not easily accessible without such assistance. Exposure Time: Please see nurses notes. Contrast: None used. Fluoroscopic Guidance: I was personally present during the use of fluoroscopy. "Tunnel Vision Technique" used to obtain the best possible view of the target area. Parallax error corrected before commencing the procedure. "Direction-depth-direction" technique used to introduce the needle under continuous pulsed fluoroscopy. Once target was reached, antero-posterior, oblique, and lateral fluoroscopic projection used confirm needle placement in all planes. Images permanently stored in EMR.            Interpretation: No contrast injected. I personally interpreted the imaging intraoperatively. Adequate needle placement confirmed in multiple planes. Permanent images saved into the patient's record.  Antibiotic Prophylaxis:   Anti-infectives (From admission, onward)   Start     Dose/Rate Route Frequency Ordered Stop   08/18/20 0800  ceFAZolin (ANCEF) IVPB 1 g/50 mL premix        1 g 100 mL/hr over 30  Minutes Intravenous  Once 08/18/20 0752     08/18/20 0000  cephALEXin (KEFLEX) 500 MG capsule  Note to Pharmacy: Do not place medication on "Automatic Refill".   500 mg Oral 3 times daily 08/18/20 1057 08/25/20 2359     Indication(s): None identified  Post-operative Assessment:  Post-procedure Vital Signs:  Pulse/HCG Rate: 9081 Temp: 97.6 F (36.4 C) Resp: 17 BP: (!) 121/91 SpO2: 474 %  Complications: No immediate post-treatment complications observed by team, or reported by patient.  Note: The patient tolerated the entire procedure well. A repeat set of vitals were taken after the procedure and the patient was kept under observation following institutional policy, for this type of procedure. Post-procedural neurological assessment was performed, showing return to baseline, prior to discharge. The patient was provided with post-procedure discharge instructions, including a section on how to identify potential problems. Should any problems arise concerning this procedure, the patient was given instructions to immediately contact us, at any time, without hesitation. In any case, we plan to contact the patient by telephone for a follow-up status report regarding this interventional procedure.  Comments:  No additional relevant information.  Plan of Care  Orders:  Orders Placed This Encounter  Procedures   [facility] representative to make sure they are available to provide required equipment.    Scheduling Instructions:     Side: Bilateral     Level: Lumbar     Device: Medtronic     Sedation: With sedation     Timeframe: Today    Order Specific Question:   Where will this procedure be performed?    Answer:   [facility]   DG PAIN [facility] C-ARM 1-60 MIN NO REPORT    Intraoperative interpretation by procedural physician at [facility].    Standing Status:   Standing    Number of Occurrences:   1    Order Specific Question:    Reason for exam:    Answer:   Assistance in needle guidance and placement for procedures requiring needle placement in or near specific anatomical locations not easily accessible without such assistance.   Informed Consent Details: Physician/Practitioner Attestation; Transcribe to consent form and obtain patient signature    Note: Always confirm laterality of pain with [name], before procedure. Transcribe to consent form and obtain patient signature.    Scheduling Instructions:     CPT Code: PERCUTANEOUS IMPLANTATION OF NEUROSTIMULATOR ELECTRODE ARRAY, EPIDURAL (25956)     ICD-10-CM Support Codes:    Order Specific Question:   Physician/Practitioner attestation of informed consent for procedure/surgical case    Answer:   I, the physician/practitioner, attest that I have discussed with the patient the benefits, risks, side effects, alternatives, likelihood of achieving goals and potential problems during recovery for the procedure that I have provided informed consent.    Order Specific Question:   Procedure    Answer:   Neurostimulator Trial    Order Specific Question:   Physician/Practitioner performing the procedure    Answer:   Aava Deland A. Dossie Arbour, MD    Order Specific Question:   Indication/Reason    Answer:   Regional Chronic Pain Syndrome   Provide equipment / supplies at bedside    Epidural Tray (x1) Sterile towel pack (x2) Small streight hemostat (x2) Large hemostat (x1) Sutures (cutting needle) 0-silk (x1) Needle holder (x1) Scissors (Mayo) (x1) 3/4" Steri-strip pack (x2) 4x4 Gauze Pack (x1) Large sterile transparent dressings (Tegaderm) (x1)    Standing Status:   Standing    Number of Occurrences:   1    Order  Specific Question:   Specify    Answer:   Epidural Tray & Minor Surgery Tray   Follow-up    Post-procedure Phone Call: Call patient tomorrow for routine early follow-up evaluation.  Return Appointment Timeframe: Approximately 6-7 days, to remove Trial  leads.    Standing Status:   Standing    Number of Occurrences:   1    Order Specific Question:   Specify    Answer:   Schedule a return appointment for post-procedure evaluation. In addition arrange for patient to receive a follow-up phone call tomorrow to assess post-procedure status.   Miscellanous precautions    Standing Status:   Standing    Number of Occurrences:   1   Miscellanous precautions    NOTE: Although It is true that patients can have allergies to shellfish and that shellfish contain iodine, most shellfish  allergies are due to two protein allergens present in the shellfish: tropomyosins and parvalbumin. Not all patients with shellfish allergies are allergic to iodine. However, as a precaution, avoid using iodine containing products.    Standing Status:   Standing    Number of Occurrences:   1   Chronic Opioid Analgesic:  None provided by our practice. NO OPIOIDS: UDS (08/07/17) (+) for Unreported Benzoylecgonine, a metabolite of cocaine; its presence    indicates use of this drug.  Source is most commonly illicit.   Medications administered: We administered lidocaine, lactated ringers, midazolam, fentaNYL, ropivacaine (PF) 2 mg/mL (0.2%), and sodium chloride flush.  See the medical record for exact dosing, route, and time of administration.  Follow-up plan:   Return in about 1 week (around 08/25/2020) for on afternoon of procedure day, (F2F), (PPE) for removal of SCS trial leads.       Interventional Pending:   Pending medical psychology evaluation for a lumbar spinal cord stimulator trial.  Referral sent done 04/12/2020.  The patient indicates having a virtual visit appointment in December for that evaluation.  (Dr. Lyman Speller) Repeat MRI of the cervical spine as his symptoms have progressed in the right upper extremity and have not responded to a cervical epidural steroid injection x1. Today I will schedule the patient for a right cervical epidural steroid injection #3 in  an attempt to see if we can get results similar to those that we attained with her very first cervical epidural done on 12/04/2018 which provided her with 100% relief of the pain in the left upper extremity symptoms.   Under consideration:   NOTE: (IODINE) SHELLFISH & CONTRAST ALLERGY!! (ANAPHYLACTIC)  Lumbar spinal cord stimulator trial     Palliative treatment options:   Therapeutic/palliative left lumbar sympathetic block #7 (last done 03/29/2020: 100/25/75/85) Diagnostic bilateral cervical facet block #2  Diagnostic bilateral Thoracic T7, T8, T9, & T10 Facet Medial Branch block #2 (80/100/100)  Diagnostic/therapeutic left CESI #2 (100/100/100)  Diagnostic/therapeutic right CESI #2  Palliative bilateral lumbar facet block #3  Palliative left lumbar facet RFA #2 (last done 05/19/2019)  Palliative right lumbar facet RFA #1 (last done 06/30/2019)      Recent Visits Date Type Provider Dept  06/22/20 Telemedicine Milinda Pointer, MD [facility]  Showing recent visits within past 90 days and meeting all other requirements Today's Visits Date Type Provider Dept  08/18/20 Procedure visit Milinda Pointer, MD [facility]  Showing today's visits and meeting all other requirements Future Appointments Date Type Provider Dept  08/25/20 Appointment Milinda Pointer, MD [facility]  Showing future appointments within next 90 days  and meeting all other requirements  Disposition: Discharge home  Discharge (Date   Time): 08/18/2020; 1032 hrs.   Primary Care Physician: Tracie Harrier, MD Location: [facility] Note by: Gaspar Cola, MD Date: 08/18/2020; Time: 10:57 AM

## 2020-08-18 ENCOUNTER — Ambulatory Visit
Admission: RE | Admit: 2020-08-18 | Discharge: 2020-08-18 | Disposition: A | Discharge status: Home / Self Care | Payer: Medicare Other | Source: Ambulatory Visit | Attending: Pain Medicine | Admitting: Pain Medicine

## 2020-08-18 ENCOUNTER — Other Ambulatory Visit: Payer: Self-pay

## 2020-08-18 ENCOUNTER — Ambulatory Visit (HOSPITAL_BASED_OUTPATIENT_CLINIC_OR_DEPARTMENT_OTHER): Payer: Medicare Other | Admitting: Pain Medicine

## 2020-08-18 ENCOUNTER — Encounter: Payer: Self-pay | Admitting: Pain Medicine

## 2020-08-18 ENCOUNTER — Telehealth: Payer: Self-pay

## 2020-08-18 VITALS — BP 121/91 | HR 90 | Temp 97.6°F | Resp 17 | Ht 68.0 in | Wt 184.0 lb

## 2020-08-18 DIAGNOSIS — M4800 Spinal stenosis, site unspecified: Secondary | ICD-10-CM

## 2020-08-18 DIAGNOSIS — M47816 Spondylosis without myelopathy or radiculopathy, lumbar region: Secondary | ICD-10-CM

## 2020-08-18 DIAGNOSIS — R937 Abnormal findings on diagnostic imaging of other parts of musculoskeletal system: Secondary | ICD-10-CM

## 2020-08-18 DIAGNOSIS — Z91041 Radiographic dye allergy status: Secondary | ICD-10-CM

## 2020-08-18 DIAGNOSIS — M5127 Other intervertebral disc displacement, lumbosacral region: Secondary | ICD-10-CM | POA: Insufficient documentation

## 2020-08-18 DIAGNOSIS — M79604 Pain in right leg: Secondary | ICD-10-CM | POA: Insufficient documentation

## 2020-08-18 DIAGNOSIS — G894 Chronic pain syndrome: Secondary | ICD-10-CM

## 2020-08-18 DIAGNOSIS — M4310 Spondylolisthesis, site unspecified: Secondary | ICD-10-CM | POA: Insufficient documentation

## 2020-08-18 DIAGNOSIS — G8929 Other chronic pain: Secondary | ICD-10-CM | POA: Diagnosis present

## 2020-08-18 DIAGNOSIS — G90522 Complex regional pain syndrome I of left lower limb: Secondary | ICD-10-CM | POA: Diagnosis present

## 2020-08-18 DIAGNOSIS — M5134 Other intervertebral disc degeneration, thoracic region: Secondary | ICD-10-CM | POA: Insufficient documentation

## 2020-08-18 DIAGNOSIS — Z91013 Allergy to seafood: Secondary | ICD-10-CM | POA: Insufficient documentation

## 2020-08-18 DIAGNOSIS — M4316 Spondylolisthesis, lumbar region: Secondary | ICD-10-CM | POA: Diagnosis not present

## 2020-08-18 DIAGNOSIS — M5136 Other intervertebral disc degeneration, lumbar region: Secondary | ICD-10-CM | POA: Diagnosis not present

## 2020-08-18 DIAGNOSIS — M48061 Spinal stenosis, lumbar region without neurogenic claudication: Secondary | ICD-10-CM | POA: Insufficient documentation

## 2020-08-18 DIAGNOSIS — M79605 Pain in left leg: Secondary | ICD-10-CM | POA: Insufficient documentation

## 2020-08-18 DIAGNOSIS — M51369 Other intervertebral disc degeneration, lumbar region without mention of lumbar back pain or lower extremity pain: Secondary | ICD-10-CM

## 2020-08-18 DIAGNOSIS — M5441 Lumbago with sciatica, right side: Secondary | ICD-10-CM | POA: Diagnosis present

## 2020-08-18 DIAGNOSIS — M5442 Lumbago with sciatica, left side: Secondary | ICD-10-CM | POA: Insufficient documentation

## 2020-08-18 DIAGNOSIS — Z87892 Personal history of anaphylaxis: Secondary | ICD-10-CM

## 2020-08-18 DIAGNOSIS — Z9689 Presence of other specified functional implants: Secondary | ICD-10-CM | POA: Insufficient documentation

## 2020-08-18 DIAGNOSIS — M4807 Spinal stenosis, lumbosacral region: Secondary | ICD-10-CM | POA: Insufficient documentation

## 2020-08-18 MED ORDER — MIDAZOLAM HCL 5 MG/5ML IJ SOLN
1.0000 mg | INTRAMUSCULAR | Status: DC | PRN
  Administered 2020-08-18: 1 mg via INTRAVENOUS
  Filled 2020-08-18: qty 5

## 2020-08-18 MED ORDER — LIDOCAINE HCL 2 % IJ SOLN
20.0000 mL | Freq: Once | INTRAMUSCULAR | Status: AC
  Administered 2020-08-18: 400 mg
  Filled 2020-08-18: qty 40

## 2020-08-18 MED ORDER — FENTANYL CITRATE (PF) 100 MCG/2ML IJ SOLN
25.0000 ug | INTRAMUSCULAR | Status: DC | PRN
  Administered 2020-08-18: 75 ug via INTRAVENOUS
  Filled 2020-08-18: qty 2

## 2020-08-18 MED ORDER — SODIUM CHLORIDE 0.9% FLUSH
10.0000 mL | Freq: Once | INTRAVENOUS | Status: AC
  Administered 2020-08-18: 10 mL

## 2020-08-18 MED ORDER — CEFAZOLIN SODIUM-DEXTROSE 1-4 GM/50ML-% IV SOLN: 1.0000 g | Freq: Once | INTRAVENOUS | Status: DC

## 2020-08-18 MED ORDER — LACTATED RINGERS IV SOLN
1000.0000 mL | Freq: Once | INTRAVENOUS | Status: AC
  Administered 2020-08-18: 1000 mL via INTRAVENOUS

## 2020-08-18 MED ORDER — CEPHALEXIN 500 MG PO CAPS: 500.0000 mg | ORAL_CAPSULE | Freq: Three times a day (TID) | ORAL | 0 refills | Status: AC

## 2020-08-18 MED ORDER — ROPIVACAINE HCL 2 MG/ML IJ SOLN
20.0000 mL | Freq: Once | INTRAMUSCULAR | Status: AC
  Administered 2020-08-18: 10 mL
  Filled 2020-08-18: qty 20

## 2020-08-18 NOTE — Patient Instructions (Signed)
____________________________________________________________________________________________  Post-Procedure Discharge Instructions  Instructions:  Apply ice:   Purpose: This will minimize any swelling and discomfort after procedure.   When: Day of procedure, as soon as you get home.  How: Fill a plastic sandwich bag with crushed ice. Cover it with a small towel and apply to injection site.  How long: (15 min on, 15 min off) Apply for 15 minutes then remove x 15 minutes.  Repeat sequence on day of procedure, until you go to bed.  Apply heat:   Purpose: To treat any soreness and discomfort from the procedure.  When: Starting the next day after the procedure.  How: Apply heat to procedure site starting the day following the procedure.  How long: May continue to repeat daily, until discomfort goes away.  Food intake: Start with clear liquids (like water) and advance to regular food, as tolerated.   Physical activities: Keep activities to a minimum for the first 8 hours after the procedure. After that, then as tolerated.  Driving: If you have received any sedation, be responsible and do not drive. You are not allowed to drive for 24 hours after having sedation.  Blood thinner: (Applies only to those taking blood thinners) You may restart your blood thinner 6 hours after your procedure.  Insulin: (Applies only to Diabetic patients taking insulin) As soon as you can eat, you may resume your normal dosing schedule.  Infection prevention: Keep procedure site clean and dry. Shower daily and clean area with soap and water.  Post-procedure Pain Diary: Extremely important that this be done correctly and accurately. Recorded information will be used to determine the next step in treatment. For the purpose of accuracy, follow these rules:  Evaluate only the area treated. Do not report or include pain from an untreated area. For the purpose of this evaluation, ignore all other areas of pain,  except for the treated area.  After your procedure, avoid taking a long nap and attempting to complete the pain diary after you wake up. Instead, set your alarm clock to go off every hour, on the hour, for the initial 8 hours after the procedure. Document the duration of the numbing medicine, and the relief you are getting from it.  Do not go to sleep and attempt to complete it later. It will not be accurate. If you received sedation, it is likely that you were given a medication that may cause amnesia. Because of this, completing the diary at a later time may cause the information to be inaccurate. This information is needed to plan your care.  Follow-up appointment: Keep your post-procedure follow-up evaluation appointment after the procedure (usually 2 weeks for most procedures, 6 weeks for radiofrequencies). DO NOT FORGET to bring you pain diary with you.   Expect: (What should I expect to see with my procedure?)  From numbing medicine (AKA: Local Anesthetics): Numbness or decrease in pain. You may also experience some weakness, which if present, could last for the duration of the local anesthetic.  Onset: Full effect within 15 minutes of injected.  Duration: It will depend on the type of local anesthetic used. On the average, 1 to 8 hours.   From steroids (Applies only if steroids were used): Decrease in swelling or inflammation. Once inflammation is improved, relief of the pain will follow.  Onset of benefits: Depends on the amount of swelling present. The more swelling, the longer it will take for the benefits to be seen. In some cases, up to 10 days.    Duration: Steroids will stay in the system x 2 weeks. Duration of benefits will depend on multiple posibilities including persistent irritating factors.  Side-effects: If present, they may typically last 2 weeks (the duration of the steroids).  Frequent: Cramps (if they occur, drink Gatorade and take over-the-counter Magnesium 450-500 mg  once to twice a day); water retention with temporary weight gain; increases in blood sugar; decreased immune system response; increased appetite.  Occasional: Facial flushing (red, warm cheeks); mood swings; menstrual changes.  Uncommon: Long-term decrease or suppression of natural hormones; bone thinning. (These are more common with higher doses or more frequent use. This is why we prefer that our patients avoid having any injection therapies in other practices.)   Very Rare: Severe mood changes; psychosis; aseptic necrosis.  From procedure: Some discomfort is to be expected once the numbing medicine wears off. This should be minimal if ice and heat are applied as instructed.  Call if: (When should I call?)  You experience numbness and weakness that gets worse with time, as opposed to wearing off.  New onset bowel or bladder incontinence. (Applies only to procedures done in the spine)  Emergency Numbers:  Durning business hours (Monday - Thursday, 8:00 AM - 4:00 PM) (Friday, 9:00 AM - 12:00 Noon): [phone]  After hours: [phone]  NOTE: If you are having a problem and are unable connect with, or to talk to a provider, then go to your nearest urgent care or emergency department. If the problem is serious and urgent, please call 911. ____________________________________________________________________________________________    Spinal Cord Stimulation Trial Information A spinal cord stimulation trial is a test to see whether a spinal cord stimulator reduces your pain. A spinal cord stimulator is a small device that is inserted (implanted) in your back. The stimulator has small wires (leads) that connect it to your spinal cord. The stimulator sends electrical pulses through the leads to the spinal cord. This can relieve pain. Settings for the stimulator can be adjusted with a remote device to find the best pain control. Your health care provider may suggest a spinal cord  stimulation trial if other treatments for long-lasting (chronic) pain have not worked for you. Spinal cord stimulation may be used to manage pain that is caused by:  Coronary artery disease or peripheral vascular disease.  Failed back surgery.  Phantom limb sensation.  Peripheral neuropathy.  Complex regional pain syndrome.  Other syndromes that involve chronic pain. For the trial, the stimulator is attached to your back instead of inserted under the skin. A trial period is usually 3-5 days, but this can vary among health care providers. After your trial period, you and your health care provider will discuss whether a permanent spinal cord stimulator is an option for you. The permanent stimulator may be an option depending on:  Whether the stimulator reduces your pain during the trial.  Whether the stimulator fits into your lifestyle.  Whether the cost of the stimulator is covered by your insurance. What are the risks? Generally, a spinal cord stimulation trial is safe. However, problems can occur, including:  Bleeding or pain at the insertion site of the leads.  Infection at the insertion site or around the leads.  Allergic reactions to medicines, devices, or dyes.  Damage to the skin, nerves, back muscles, or spinal cord where the leads are placed.  Inability to move the legs (paralysis).  Numbness in the legs.  Inability to control when you urinate or have a bowel movement (incontinence).  Spinal fluid leakage. How is a spinal cord stimulator placed for a trial? For a trial period, the stimulator is placed on your skin, not under it. Only the leads that connect the stimulator to the spinal cord are implanted under your skin. The exact location of the stimulator depends on where you have pain. There are two types of surgery for implanting the leads:  Noninvasive surgery. In this type of surgery, a small incision is made and needles are used to place the leads under your  skin.  Open surgery. In this type of surgery, a larger incision is made, and the leads are implanted directly into your back.   How should I care for myself during the trial period? Activity  Return to your normal activities as told by your health care provider. Ask your health care provider what activities are safe for you.  Do not lift anything that is heavier than 10 lb (4.5 kg), or the limit that you are told. General instructions  Follow your health care provider's specific instructions about how to take care of your spinal cord stimulator and your incision.  Make sure you write down the following information so that you can share this information with your health care provider: ? Your responses to the stimulator. Describe these as told by your health care provider. ? Your pain level throughout the day. ? The amount and kind of pain medicine that you take.  Take over-the-counter and prescription medicines only as told by your health care provider.  Do not take baths, swim, or use a hot tub until your health care provider approves.  Tell all health care providers who provide care for you that you have a spinal cord stimulator. This is important information that could affect the medical treatment that you receive.  Keep all follow-up visits as told by your health care provider. This is important. When should I seek medical care? During the trial, seek medical care if:  You have redness, swelling, or pain around your incision.  You have fluid or blood coming from your incision.  Your incision feels warm to the touch.  You have pus or a bad smell coming from your incision.  The bandage (dressing) that covers your incision comes off. Get help right away if:  Your pain gets worse.  The stimulator leads come out.  You develop numbness or weakness in your legs, or you have difficulty walking.  You have problems urinating or having a bowel movement.  You have a  fever.  You have symptoms that last for more than 2-3 days.  Your symptoms suddenly get worse. Summary  A spinal cord stimulator is a small device that sends electrical pulses to your spinal cord. This can relieve pain caused by many different health conditions.  Before a permanent stimulator is placed, you will have a trial using a temporary stimulator that is not implanted under your skin. This helps determine if a stimulator will reduce your pain.  For the trial, only the leads that connect the stimulator to the spinal cord are implanted under your skin.  During the trial period, make sure you write down information about your pain and your responses to the stimulator so that you can share this information with your health care provider.  Keep all follow-up visits as told by your health care provider. This is important. Contact your health care provider if you have symptoms that indicate a problem. This information is not intended to replace advice given to  you by your health care provider. Make sure you discuss any questions you have with your health care provider. Document Revised: 02/13/2019 Document Reviewed: 06/25/2018 Elsevier Patient Education  2021 [employer].

## 2020-08-18 NOTE — Telephone Encounter (Signed)
Please review the progress notes on 06/22/2020 2 15:40, Pt called in very upset that its states "She is a current drug user of Cocaine" 4 Line from the bottom.Marland Kitchen She wants it removed from her chart and would like to speak to someone about this issue.

## 2020-08-18 NOTE — Progress Notes (Signed)
Safety precautions to be maintained throughout the outpatient stay will include: orient to surroundings, keep bed in low position, maintain call bell within reach at all times, provide assistance with transfer out of bed and ambulation.  

## 2020-08-19 ENCOUNTER — Telehealth: Payer: Self-pay | Admitting: Pain Medicine

## 2020-08-19 NOTE — Telephone Encounter (Signed)
Spoke with patient and she states that she is on the phone with the [employer] rep at this time.  Instructed her to call us back for any further questions or concerns.

## 2020-08-25 ENCOUNTER — Encounter: Payer: Self-pay | Admitting: Pain Medicine

## 2020-08-25 ENCOUNTER — Ambulatory Visit: Payer: Medicare Other | Attending: Pain Medicine | Admitting: Pain Medicine

## 2020-08-25 ENCOUNTER — Other Ambulatory Visit: Payer: Self-pay

## 2020-08-25 VITALS — BP 131/98 | HR 78 | Temp 97.2°F | Resp 16 | Ht 68.0 in | Wt 182.0 lb

## 2020-08-25 DIAGNOSIS — D1779 Benign lipomatous neoplasm of other sites: Secondary | ICD-10-CM

## 2020-08-25 DIAGNOSIS — M79604 Pain in right leg: Secondary | ICD-10-CM | POA: Diagnosis present

## 2020-08-25 DIAGNOSIS — M5136 Other intervertebral disc degeneration, lumbar region: Secondary | ICD-10-CM

## 2020-08-25 DIAGNOSIS — G90522 Complex regional pain syndrome I of left lower limb: Secondary | ICD-10-CM | POA: Diagnosis present

## 2020-08-25 DIAGNOSIS — M4807 Spinal stenosis, lumbosacral region: Secondary | ICD-10-CM

## 2020-08-25 DIAGNOSIS — M47816 Spondylosis without myelopathy or radiculopathy, lumbar region: Secondary | ICD-10-CM | POA: Diagnosis present

## 2020-08-25 DIAGNOSIS — M5127 Other intervertebral disc displacement, lumbosacral region: Secondary | ICD-10-CM | POA: Diagnosis present

## 2020-08-25 DIAGNOSIS — M488X7 Other specified spondylopathies, lumbosacral region: Secondary | ICD-10-CM

## 2020-08-25 DIAGNOSIS — M79605 Pain in left leg: Secondary | ICD-10-CM | POA: Diagnosis present

## 2020-08-25 DIAGNOSIS — G894 Chronic pain syndrome: Secondary | ICD-10-CM

## 2020-08-25 DIAGNOSIS — M5442 Lumbago with sciatica, left side: Secondary | ICD-10-CM | POA: Insufficient documentation

## 2020-08-25 DIAGNOSIS — M5441 Lumbago with sciatica, right side: Secondary | ICD-10-CM | POA: Diagnosis present

## 2020-08-25 DIAGNOSIS — R937 Abnormal findings on diagnostic imaging of other parts of musculoskeletal system: Secondary | ICD-10-CM

## 2020-08-25 DIAGNOSIS — E882 Lipomatosis, not elsewhere classified: Secondary | ICD-10-CM

## 2020-08-25 DIAGNOSIS — G8929 Other chronic pain: Secondary | ICD-10-CM

## 2020-08-25 DIAGNOSIS — Z9689 Presence of other specified functional implants: Secondary | ICD-10-CM | POA: Diagnosis not present

## 2020-08-25 DIAGNOSIS — M48061 Spinal stenosis, lumbar region without neurogenic claudication: Secondary | ICD-10-CM | POA: Diagnosis present

## 2020-08-25 DIAGNOSIS — M4310 Spondylolisthesis, site unspecified: Secondary | ICD-10-CM

## 2020-08-25 DIAGNOSIS — M51369 Other intervertebral disc degeneration, lumbar region without mention of lumbar back pain or lower extremity pain: Secondary | ICD-10-CM

## 2020-08-25 NOTE — Progress Notes (Signed)
Safety precautions to be maintained throughout the outpatient stay will include: orient to surroundings, keep bed in low position, maintain call bell within reach at all times, provide assistance with transfer out of bed and ambulation.  

## 2020-08-25 NOTE — Progress Notes (Signed)
PROVIDER NOTE: Information contained herein reflects review and annotations entered in association with encounter. Interpretation of such information and data should be left to medically-trained personnel. Information provided to patient can be located elsewhere in the medical record under "Patient Instructions". Document created using STT-dictation technology, any transcriptional errors that may result from process are unintentional.    Patient: Samantha Macias  Service Category: Procedure  Provider: Gaspar Cola, MD  DOB: 06-16-1960  DOS: 08/25/2020  Location: [facility]  MRN: [id number]  Setting: Ambulatory - outpatient  Referring Provider: Tracie Harrier, MD  Type: Established Patient  Specialty: Interventional Pain Management  PCP: Tracie Harrier, MD   Primary Reason for Admission: Surgical management of chronic pain condition.  Procedure:  Anesthesia, Analgesia, Anxiolysis:  Type: Removal of Trial Neurostimulator Leads Purpose: End-of-trial Region: Lumbar Laterality: Bilateral   None required   Indications: 1. S/P insertion of spinal cord stimulator   2. CRPS 1 (complex regional pain syndrome I) of lower limb (Left)   3. DDD (degenerative disc disease), lumbar   4. Grade 1 Retrolisthesis of L5/S1   5. 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 Chronic low back pain (1ry area of Pain) (Bilateral) (L>R) w/ sciatica   7. Chronic lower extremity pain (2ry area of Pain) (Bilateral) (L>R)   8. Lumbar facet hypertrophy (Bilateral)   9. Lumbosacral foraminal stenosis (Left: L2, L5) (Bilateral: L4)   10. 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 Lumbar central spinal stenosis (L4-5)   12. Lumbar foraminal annular tear (Left: L2-3, L4-5)   13. Abnormal MRI, lumbar spine (04/16/2019)   14. Lumbar foraminal stenosis   15. Epidural lipomatosis    Pain Score: Pre-procedure: 1 /10 Post-procedure: 1 /10    Neuromodulation Implant Therapy Assessment  Epidural Neurostimulator implant: Side-effects or Adverse reactions: None reported.  She indicated that depending on how she moved she would occasionally get a "zapping sensation" on her left flank Effectiveness: The patient refers having attained 100% relief of her lower extremity pain and at least 75% relief of her lower back pain.  She also indicated being able to do more, have better range of motion, sit up straight, walk straight, and overall she feels it was perfect and she wants to move onto the permanent implant as soon as possible. Plan: Removal of trial leads today          Pre-op Assessment:  Ms. Crunkleton is a 60 y.o. (year old), female patient, seen today for removal of neurostimulator trial lead(s). She  has a past surgical history that includes Tubal ligation; Nasal sinus surgery; Colonoscopy with propofol (N/A, 04/01/2017); Esophagogastroduodenoscopy (egd) with propofol (N/A, 04/01/2017); and Mouth surgery.  Initial Vital Signs:  Pulse: 78  Temp: (!) 97.2 F (36.2 C) Resp: 16 BP: (!) 131/98 SpO2: 98 %  BMI: Estimated body mass index is 27.67 kg/m as calculated from the following:   Height as of this encounter: 5' 8" (1.727 m).   Weight as of this encounter: 182 lb (82.6 kg).  Risk Assessment: Allergies: Reviewed. She is allergic to contrast media [iodinated diagnostic agents], shellfish allergy, tetanus toxoid adsorbed, ace inhibitors, codeine, nitrofurantoin monohyd macro, and red dye.  Allergy Precautions: None required Coagulopathies: Reviewed. None identified.  Blood-thinner therapy: None at this time Active Infection(s): Reviewed. None identified. Ms. Uhls is afebrile  Site Confirmation: Ms. Gilroy was asked to confirm the procedure and laterality before marking the site, which she did. Procedure checklist: Completed Consent:  Ms. Goshorn consents to removal of trial leads. Attestation: I, the ordering provider,  attest that I have discussed with the patient the risks and potential problems associated with procedure. Date  Time: 08/25/2020  1:44 PM  Pre-Procedure Preparation:  Monitoring: As per clinic protocol. Respiration, ETCO2, SpO2, BP, heart rate and rhythm monitor placed and checked for adequate function Safety Precautions: Patient was assessed for positional comfort and pressure points before starting the procedure. Time-out: I initiated and conducted the "Time-out" before starting the procedure, as per protocol. The patient was asked to participate by confirming the accuracy of the "Time Out" information. Verification of the correct person, site, and procedure were performed and confirmed by me, the nursing staff, and the patient. "Time-out" conducted as per Joint Commission's Universal Protocol (UP.01.01.01).  Description of Procedure Process:   Position: Sitting Area Prepped: Around implant device site Prepping solution: ChloraPrep (2% chlorhexidine gluconate and 70% isopropyl alcohol) Safety Precautions: Sterile technique used  Description of the Procedure: Availability of a responsible, adult driver, and NPO status confirmed. Informed consent was obtained after having discussed risks and possible complications. An IV was started. The patient was then taken to the fluoroscopy suite, where the patient was placed in position for the procedure, over the fluoroscopy table. The patient was then monitored in the usual manner. The bandages were removed and the surgical area was prepped using a broad-spectrum topical antiseptic. Meaningful verbal contact was maintained with the patient at all times. The stitches were then removed and the patient asked to mildly flex the spine. Using constant tension over the leads, the electrodes were removed in their entirety, without any apparent complication. The patient tolerated the entire procedure well. Following the performance of this procedure, the patient was  kept under observation until the discharge criteria were met. The patient was sent home in stable condition. The patient was provided with discharge instructions, including a section on how to identify potential problems. Should any problems arise concerning this procedure, the patient was given instructions to contact us, without hesitation. In any case, we will contact the patient by telephone for a follow-up status report regarding this interventional procedure.  Vitals:   08/25/20 1344 08/25/20 1345  BP:  (!) 131/98  Pulse:  78  Resp:  16  Temp: (!) 97.2 F (36.2 C)   SpO2:  98%  Weight: 182 lb (82.6 kg)   Height: 5' 8" (1.727 m)    Materials: Sterile suture removal kit; Sterile gloves; Sterile gauze; Band-aid  Medication(s): N/A  Post-operative Assessment:  Post-procedure Vital Signs:  Pulse/HCG Rate: 78  Temp: (!) 97.2 F (36.2 C) Resp: 16 BP: (!) 131/98 SpO2: 98 %  Complications: No immediate post-treatment complications observed by team, or reported by patient.  Note: The patient tolerated the entire procedure well. A repeat set of vitals were taken after the procedure and the patient was kept under observation following institutional policy, for this type of procedure. Post-procedural neurological assessment was performed, showing return to baseline, prior to discharge. The patient was provided with post-procedure discharge instructions, including a section on how to identify potential problems. Should any problems arise concerning this procedure, the patient was given instructions to immediately contact us, at any time, without hesitation. In any case, we plan to contact the patient by telephone for a follow-up status report regarding this interventional procedure.  Comments:  No additional relevant information.  Plan of Care  Orders:  Orders Placed This Encounter  Procedures  . Suture removal kit  Please have suture removal kit available in exam room.    Standing  Status:   Standing    Number of Occurrences:   1  . Ambulatory referral to Neurosurgery    Referral Priority:   Routine    Referral Type:   Surgical    Referral Reason:   Specialty Services Required    Referred to Provider:   Deetta Perla, MD    Number of Visits Requested:   1  . Provide equipment / supplies at bedside    "Suture Removal Kit" (Disposable  single use)    Standing Status:   Standing    Number of Occurrences:   1    Order Specific Question:   Specify    Answer:   Suture Removal Kit  . Lumbar spinal cord simulator lead removal    Have suture removal kit available in room. Other equipment: Alcohol Prep; sterile gloves; sterile scissors (suture removal kit); Band-Aid; 4x4 gauze.    Scheduling Instructions:     Procedure: Lumbar spinal cord simulator lead removal     Laterality: N/A     Sedation: No Sedation     Timeframe:  Today   Chronic Opioid Analgesic:  None provided by our practice. NO OPIOIDS: UDS (08/07/17) (+) for Unreported Benzoylecgonine, a metabolite of cocaine; its presence    indicates use of this drug.  Source is most commonly illicit.   Medications administered: Aleysha Meckler. Memon had no medications administered during this visit.  See the medical record for exact dosing, route, and time of administration.  Follow-up plan:   Return in about 6 weeks (around 10/09/2020) for (F2F), (MM).       Interventional Therapies  Risk  Complexity Considerations:   NOTE: (IODINE) SHELLFISH & CONTRAST ALLERGY!! (ANAPHYLACTIC)    Planned  Pending:   Pending further evaluation   Under consideration:      Completed:   Diagnostic bilateral lumbar spinal cord stimulator trial (08/18/2020)  Therapeutic/palliative left L3, L4 lumbar sympathetic block x6 (03/29/2020) (100/25/75/85) Diagnostic bilateral cervical facet MBB x1 (03/12/2019) (90/90/25 x2 weeks/0)  Diagnostic bilateral Thoracic T7, T8, T9, & T10 Facet MBB x1 (01/08/2019) (80/100/100)  Diagnostic/therapeutic  left CESI x1 (12/04/2018) (100/100/100)  Diagnostic/therapeutic right CESI x1 (04/19/2020) (70/70/0/0)  Diagnostic bilateral lumbar facet block x2 (04/14/2019) (100/100/100 x 2 days/0)  Palliative left lumbar facet RFA x1 (05/19/2019) (100/100/80/> 75)  Palliative right lumbar facet RFA x1 (06/30/2019) (100/100/80/> 75)    Therapeutic  Palliative (PRN) options:   Therapeutic/palliative left lumbar sympathetic block #7  Diagnostic bilateral cervical facet block #2  Diagnostic bilateral Thoracic T7, T8, T9, & T10 Facet MBB #2  Therapeutic left CESI #2  Therapeutic right CESI #2  Palliative bilateral lumbar facet MBB #3  Palliative left lumbar facet RFA #2  Palliative right lumbar facet RFA #1     Recent Visits Date Type Provider Dept  08/18/20 Procedure visit Milinda Pointer, MD [facility]  06/22/20 Telemedicine Milinda Pointer, MD [facility]  Showing recent visits within past 90 days and meeting all other requirements Today's Visits Date Type Provider Dept  08/25/20 Office Visit Milinda Pointer, MD [facility]  Showing today's visits and meeting all other requirements Future Appointments No visits were found meeting these conditions. Showing future appointments within next 90 days and meeting all other requirements  Disposition: Discharge home  Discharge (Date  Time): 08/25/2020; 1358 hrs.   Primary Care Physician: Tracie Harrier, MD Location: [facility] Note by: Beatriz Chancellor  Farrel Conners, MD Date: 08/25/2020; Time: 2:08 PM

## 2020-08-25 NOTE — Patient Instructions (Signed)
Clean area on back at least twice a day.

## 2020-10-05 ENCOUNTER — Encounter: Payer: Self-pay | Admitting: Pain Medicine

## 2020-10-05 ENCOUNTER — Other Ambulatory Visit: Payer: Self-pay | Admitting: Neurosurgery

## 2020-10-06 ENCOUNTER — Other Ambulatory Visit: Payer: Self-pay

## 2020-10-06 ENCOUNTER — Ambulatory Visit: Payer: Medicare Other | Attending: Pain Medicine | Admitting: Pain Medicine

## 2020-10-06 DIAGNOSIS — M4722 Other spondylosis with radiculopathy, cervical region: Secondary | ICD-10-CM

## 2020-10-06 DIAGNOSIS — M4712 Other spondylosis with myelopathy, cervical region: Secondary | ICD-10-CM | POA: Diagnosis not present

## 2020-10-06 DIAGNOSIS — G959 Disease of spinal cord, unspecified: Secondary | ICD-10-CM | POA: Diagnosis not present

## 2020-10-06 DIAGNOSIS — M5030 Other cervical disc degeneration, unspecified cervical region: Secondary | ICD-10-CM

## 2020-10-06 DIAGNOSIS — M542 Cervicalgia: Secondary | ICD-10-CM

## 2020-10-06 DIAGNOSIS — M4802 Spinal stenosis, cervical region: Secondary | ICD-10-CM | POA: Diagnosis not present

## 2020-10-06 NOTE — Patient Instructions (Signed)
____________________________________________________________________________________________  Preparing for Procedure with Sedation  Procedure appointments are limited to planned procedures: . No Prescription Refills. . No disability issues will be discussed. . No medication changes will be discussed.  Instructions: . Oral Intake: Do not eat or drink anything for at least 8 hours prior to your procedure. (Exception: Blood Pressure Medication. See below.) . Transportation: Unless otherwise stated by your physician, you may drive yourself after the procedure. . Blood Pressure Medicine: Do not forget to take your blood pressure medicine with a sip of water the morning of the procedure. If your Diastolic (lower reading)is above 100 mmHg, elective cases will be cancelled/rescheduled. . Blood thinners: These will need to be stopped for procedures. Notify our staff if you are taking any blood thinners. Depending on which one you take, there will be specific instructions on how and when to stop it. . Diabetics on insulin: Notify the staff so that you can be scheduled 1st case in the morning. If your diabetes requires high dose insulin, take only  of your normal insulin dose the morning of the procedure and notify the staff that you have done so. . Preventing infections: Shower with an antibacterial soap the morning of your procedure. . Build-up your immune system: Take 1000 mg of Vitamin C with every meal (3 times a day) the day prior to your procedure. . Antibiotics: Inform the staff if you have a condition or reason that requires you to take antibiotics before dental procedures. . Pregnancy: If you are pregnant, call and cancel the procedure. . Sickness: If you have a cold, fever, or any active infections, call and cancel the procedure. . Arrival: You must be in the facility at least 30 minutes prior to your scheduled procedure. . Children: Do not bring children with you. . Dress appropriately:  Bring dark clothing that you would not mind if they get stained. . Valuables: Do not bring any jewelry or valuables.  Reasons to call and reschedule or cancel your procedure: (Following these recommendations will minimize the risk of a serious complication.) . Surgeries: Avoid having procedures within 2 weeks of any surgery. (Avoid for 2 weeks before or after any surgery). . Flu Shots: Avoid having procedures within 2 weeks of a flu shots or . (Avoid for 2 weeks before or after immunizations). . Barium: Avoid having a procedure within 7-10 days after having had a radiological study involving the use of radiological contrast. (Myelograms, Barium swallow or enema study). . Heart attacks: Avoid any elective procedures or surgeries for the initial 6 months after a "Myocardial Infarction" (Heart Attack). . Blood thinners: It is imperative that you stop these medications before procedures. Let us know if you if you take any blood thinner.  . Infection: Avoid procedures during or within two weeks of an infection (including chest colds or gastrointestinal problems). Symptoms associated with infections include: Localized redness, fever, chills, night sweats or profuse sweating, burning sensation when voiding, cough, congestion, stuffiness, runny nose, sore throat, diarrhea, nausea, vomiting, cold or Flu symptoms, recent or current infections. It is specially important if the infection is over the area that we intend to treat. . Heart and lung problems: Symptoms that may suggest an active cardiopulmonary problem include: cough, chest pain, breathing difficulties or shortness of breath, dizziness, ankle swelling, uncontrolled high or unusually low blood pressure, and/or palpitations. If you are experiencing any of these symptoms, cancel your procedure and contact your primary care physician for an evaluation.  Remember:  Regular Business hours are:    Monday to Thursday 8:00 AM to 4:00 PM  Provider's  Schedule: Brynlie Daza, MD:  Procedure days: Tuesday and Thursday 7:30 AM to 4:00 PM  Bilal Lateef, MD:  Procedure days: Monday and Wednesday 7:30 AM to 4:00 PM ____________________________________________________________________________________________   ____________________________________________________________________________________________  General Risks and Possible Complications  Patient Responsibilities: It is important that you read this as it is part of your informed consent. It is our duty to inform you of the risks and possible complications associated with treatments offered to you. It is your responsibility as a patient to read this and to ask questions about anything that is not clear or that you believe was not covered in this document.  Patient's Rights: You have the right to refuse treatment. You also have the right to change your mind, even after initially having agreed to have the treatment done. However, under this last option, if you wait until the last second to change your mind, you may be charged for the materials used up to that point.  Introduction: Medicine is not an exact science. Everything in Medicine, including the lack of treatment(s), carries the potential for danger, harm, or loss (which is by definition: Risk). In Medicine, a complication is a secondary problem, condition, or disease that can aggravate an already existing one. All treatments carry the risk of possible complications. The fact that a side effects or complications occurs, does not imply that the treatment was conducted incorrectly. It must be clearly understood that these can happen even when everything is done following the highest safety standards.  No treatment: You can choose not to proceed with the proposed treatment alternative. The "PRO(s)" would include: avoiding the risk of complications associated with the therapy. The "CON(s)" would include: not getting any of the treatment  benefits. These benefits fall under one of three categories: diagnostic; therapeutic; and/or palliative. Diagnostic benefits include: getting information which can ultimately lead to improvement of the disease or symptom(s). Therapeutic benefits are those associated with the successful treatment of the disease. Finally, palliative benefits are those related to the decrease of the primary symptoms, without necessarily curing the condition (example: decreasing the pain from a flare-up of a chronic condition, such as incurable terminal cancer).  General Risks and Complications: These are associated to most interventional treatments. They can occur alone, or in combination. They fall under one of the following six (6) categories: no benefit or worsening of symptoms; bleeding; infection; nerve damage; allergic reactions; and/or death. 1. No benefits or worsening of symptoms: In Medicine there are no guarantees, only probabilities. No healthcare provider can ever guarantee that a medical treatment will work, they can only state the probability that it may. Furthermore, there is always the possibility that the condition may worsen, either directly, or indirectly, as a consequence of the treatment. 2. Bleeding: This is more common if the patient is taking a blood thinner, either prescription or over the counter (example: Goody Powders, Fish oil, Aspirin, Garlic, etc.), or if suffering a condition associated with impaired coagulation (example: Hemophilia, cirrhosis of the liver, low platelet counts, etc.). However, even if you do not have one on these, it can still happen. If you have any of these conditions, or take one of these drugs, make sure to notify your treating physician. 3. Infection: This is more common in patients with a compromised immune system, either due to disease (example: diabetes, cancer, human immunodeficiency virus [HIV], etc.), or due to medications or treatments (example: therapies used to treat  cancer and   rheumatological diseases). However, even if you do not have one on these, it can still happen. If you have any of these conditions, or take one of these drugs, make sure to notify your treating physician. 4. Nerve Damage: This is more common when the treatment is an invasive one, but it can also happen with the use of medications, such as those used in the treatment of cancer. The damage can occur to small secondary nerves, or to large primary ones, such as those in the spinal cord and brain. This damage may be temporary or permanent and it may lead to impairments that can range from temporary numbness to permanent paralysis and/or brain death. 5. Allergic Reactions: Any time a substance or material comes in contact with our body, there is the possibility of an allergic reaction. These can range from a mild skin rash (contact dermatitis) to a severe systemic reaction (anaphylactic reaction), which can result in death. 6. Death: In general, any medical intervention can result in death, most of the time due to an unforeseen complication. ____________________________________________________________________________________________   

## 2020-10-06 NOTE — Progress Notes (Signed)
Patient: Samantha Macias  Service Category: E/M  Provider: Gaspar Cola, MD  DOB: [birth year]  DOS: 10/06/2020  Location: Office  MRN: [id number]  Setting: Ambulatory outpatient  Referring Provider: Tracie Harrier, MD  Type: Established Patient  Specialty: Interventional Pain Management  PCP: Samantha Harrier, MD  Location: Remote location  Delivery: TeleHealth     Virtual Encounter - Pain Management PROVIDER NOTE: Information contained herein reflects review and annotations entered in association with encounter. Interpretation of such information and data should be left to medically-trained personnel. Information provided to patient can be located elsewhere in the medical record under "Patient Instructions". Document created using STT-dictation technology, any transcriptional errors that may result from process are unintentional.    Contact & Pharmacy Preferred: [phone] Home: [phone] (home) Mobile: [phone] (mobile) E-mail: [email or web address]  [place], [address] [place] [address] Phone: [phone] Fax: [phone]   Pre-screening  Samantha Macias offered "in-person" vs "virtual" encounter. She indicated preferring virtual for this encounter.   Reason COVID-19*  Social distancing based on CDC and AMA recommendations.   I contacted Samantha Macias on 10/06/2020 via telephone.      I clearly identified myself as Samantha Cola, MD. I verified that I was speaking with the correct person using two identifiers (Name: [name], and date of birth: [birth year]).  Consent I sought verbal advanced consent from Samantha Macias for virtual visit interactions. I informed Samantha Macias of possible security and privacy concerns, risks, and limitations associated with providing "not-in-person" medical evaluation and management services. I also informed Samantha Macias of the availability of "in-person" appointments.  Finally, I informed her that there would be a charge for the virtual visit and that she could be  personally, fully or partially, financially responsible for it. Samantha Macias expressed understanding and agreed to proceed.   Historic Elements   Samantha Macias is a 60 y.o. year old, female patient evaluated today after our last contact on 08/25/2020. Samantha Macias  has a past medical history of Abnormal gait, Allergy, Anxiety, Arthritis, Back pain, Bipolar disorder ([place]), CAD (coronary artery disease), Chronic kidney disease, Collagen vascular disease ([place]), Colon polyp, COPD (chronic obstructive pulmonary disease) ([place]), Falls, Fibromyalgia, GERD (gastroesophageal reflux disease), Hyperlipidemia, Hypertension, IBS (irritable bowel syndrome), Kidney disease, Migraine, Numbness and tingling, Renal insufficiency, Spinal stenosis, Static encephalopathy, Thyroid nodule (07/2017), and TIA (transient ischemic attack). She also  has a past surgical history that includes Tubal ligation; Nasal sinus surgery; Colonoscopy with propofol (N/A, 04/01/2017); Esophagogastroduodenoscopy (egd) with propofol (N/A, 04/01/2017); and Mouth surgery. Samantha Macias has a current medication list which includes the following prescription(s): albuterol, alprazolam, bupropion, diurex, vitamin d, cyanocobalamin, diclofenac sodium, tylenol pm extra strength, donepezil, hydrocodone-acetaminophen, losartan-hydrochlorothiazide, meloxicam, multiple vitamins-minerals, pantoprazole, pregabalin, rybelsus, rybelsus, sertraline, spiriva handihaler, temazepam, tizanidine, and anoro ellipta. She  reports that she has been smoking cigarettes. She has a 6.25 pack-year smoking history. She has never used smokeless tobacco. She reports current drug use. Drug: Cocaine. No history on file for alcohol use. Samantha Macias is allergic to contrast media [iodinated diagnostic agents], shellfish allergy, tetanus toxoid adsorbed, ace inhibitors, codeine, nitrofurantoin  monohyd macro, and red dye.   HPI  Today, she is being contacted for worsening of previously known (established) problem.  The patient indicates currently having an exacerbation of her neck pain shoulder pain and upper extremity pain and numbness on the right side.  She says that the pain has gotten  to the point where he has significantly decrease her ability to do her activities of daily living.  Today I have reviewed her 2020 cervical MRI and she has significant problems in the cervical region, but she confirms today that all of her problems have been worsening and currently they are a lot worse than when she did that MRI in 2020.  For this reason, I will go ahead and order a repeat MRI and I will schedule her to return for a therapeutic right-sided cervical epidural steroid injection #2 under fluoroscopic guidance and IV sedation.  This plan was shared with the patient who understood and agreed.  She also indicates that she already has an appointment to have her thoracolumbar spinal cord stimulator implanted.  She is back again on oral antibiotics due to another spider bite.  She refers that the site where we did the trial temporary spinal cord stimulator leads still has some scabs.  I reminded the patient that she can get a face-to-face evaluation appointment so that I can take a look at all this.  She denies any fever tenderness or any other evidence of infection.  Pharmacotherapy Assessment  Analgesic: None provided by our practice. NO OPIOIDS: UDS (08/07/17) (+) for Unreported Benzoylecgonine, a metabolite of cocaine; its presence    indicates use of this drug.  Source is most commonly illicit.   Monitoring: Samantha Macias PMP: PDMP not reviewed this encounter.       Pharmacotherapy: No side-effects or adverse reactions reported. Compliance: No problems identified. Effectiveness: Clinically acceptable. Plan: Refer to "POC".  UDS:  Summary  Date Value Ref Range Status  03/17/2018 FINAL  Final     Comment:    ==================================================================== [facility] COMP DRUG ANALYSIS,UR ==================================================================== Test                             Result       Flag       Units Drug Present and Declared for Prescription Verification   Tramadol                       1825         EXPECTED   ng/mg creat   O-Desmethyltramadol            929          EXPECTED   ng/mg creat   N-Desmethyltramadol            1352         EXPECTED   ng/mg creat    Source of tramadol is a prescription medication.    O-desmethyltramadol and N-desmethyltramadol are expected    metabolites of tramadol.   Pregabalin                     PRESENT      EXPECTED   Tizanidine                     PRESENT      EXPECTED   Bupropion                      PRESENT      EXPECTED   Hydroxybupropion               PRESENT      EXPECTED    Hydroxybupropion is an expected metabolite of bupropion.   Mirtazapine  PRESENT      EXPECTED   Quetiapine                     PRESENT      EXPECTED   Salicylate                     PRESENT      EXPECTED Drug Present not Declared for Prescription Verification   Benzoylecgonine                494          UNEXPECTED ng/mg creat    Benzoylecgonine is a metabolite of cocaine; its presence    indicates use of this drug.  Source is most commonly illicit, but    cocaine is present in some topical anesthetic solutions.   Acetaminophen                  PRESENT      UNEXPECTED   Diphenhydramine                PRESENT      UNEXPECTED Drug Absent but Declared for Prescription Verification   Alprazolam                     Not Detected UNEXPECTED ng/mg creat   Sertraline                     Not Detected UNEXPECTED   Diclofenac                     Not Detected UNEXPECTED    Topical diclofenac, as indicated in the declared medication list,    is not always detected even when used as directed.   Ibuprofen                       Not Detected UNEXPECTED    Ibuprofen, as indicated in the declared medication list, is not    always detected even when used as directed. ==================================================================== Test                      Result    Flag   Units      Ref Range   Creatinine              159              mg/dL      >=20 ==================================================================== Declared Medications:  The flagging and interpretation on this report are based on the  following declared medications.  Unexpected results may arise from  inaccuracies in the declared medications.  **Note: The testing scope of this panel includes these medications:  Alprazolam (Xanax)  Bupropion (Wellbutrin)  Mirtazapine (Remeron)  Pregabalin (Lyrica)  Quetiapine (Seroquel)  Sertraline (Zoloft)  Tramadol (Ultram)  **Note: The testing scope of this panel does not include small to  moderate amounts of these reported medications:  Aspirin (Aspirin 81)  Ibuprofen  Tizanidine (Zanaflex)  Topical Diclofenac  **Note: The testing scope of this panel does not include following  reported medications:  Albuterol  Donepezil (Aricept)  Formoterol (Perforomist)  Hydrochlorothiazide (Hyzaar)  Ipratropium  Losartan (Hyzaar)  Omeprazole (Prilosec)  Pantoprazole (Protonix)  Ramelteon (Rozerem)  Tiotropium (Spiriva)  Vitamin D ==================================================================== For clinical consultation, please call [phone]. ====================================================================     Laboratory Chemistry Profile   Renal Lab Results  Component Value Date   BUN  22 03/17/2018   CREATININE 1.35 (H) 03/17/2018   BCR 16 03/17/2018   GFRAA 51 (L) 03/17/2018   GFRNONAA 44 (L) 03/17/2018     Hepatic Lab Results  Component Value Date   AST 13 03/17/2018   ALT 15 12/24/2017   ALBUMIN 4.4 03/17/2018   ALKPHOS 148 (H) 03/17/2018   LIPASE 37 12/24/2017    AMMONIA 23 07/07/2015     Electrolytes Lab Results  Component Value Date   NA 141 03/17/2018   K 4.3 03/17/2018   CL 99 03/17/2018   CALCIUM 9.1 03/17/2018   MG 1.8 03/17/2018     Bone Lab Results  Component Value Date   25OHVITD1 41 03/17/2018   25OHVITD2 <1.0 03/17/2018   25OHVITD3 41 03/17/2018     Inflammation (CRP: Acute Phase) (ESR: Chronic Phase) Lab Results  Component Value Date   CRP 17 (H) 03/17/2018   ESRSEDRATE 28 03/17/2018       Note: Above Lab results reviewed.  Imaging  DG PAIN CLINIC C-ARM 1-60 MIN NO REPORT Fluoro was used, but no Radiologist interpretation will be provided.  Please refer to "NOTES" tab for provider progress note.  Assessment  The primary encounter diagnosis was Cervical central spinal stenosis. Diagnoses of Cervical foraminal stenosis, Cervical myelopathy (HCC), Osteoarthritis of spine with radiculopathy, cervical region, Cervical spondylosis with myelopathy, Cervicalgia (Bilateral), and DDD (degenerative disc disease), cervical were also pertinent to this visit.  Plan of Care  Problem-specific:  No problem-specific Assessment & Plan notes found for this encounter.  Ms. Justiss Gerbino has a current medication list which includes the following long-term medication(s): albuterol, bupropion, tylenol pm extra strength, donepezil, losartan-hydrochlorothiazide, pantoprazole, pregabalin, sertraline, spiriva handihaler, and temazepam.  Pharmacotherapy (Medications Ordered): No orders of the defined types were placed in this encounter.  Orders:  Orders Placed This Encounter  Procedures  . Cervical Epidural Injection    Level(s): C7-T1 Laterality: Right-sided Purpose: Diagnostic/Therapeutic Indication(s): Radiculitis and cervicalgia associater with cervical degenerative disc disease.    Standing Status:   Future    Standing Expiration Date:   11/06/2020    Scheduling Instructions:     Procedure: Cervical Epidural Steroid  Injection/Block     Sedation: Patient's choice.     Timeframe: As soon as schedule allows    Order Specific Question:   Where will this procedure be performed?    Answer:   [facility]    Comments:   by Dr. Dossie Arbour  . MR CERVICAL SPINE WO CONTRAST    Worsening of symptoms from 2020 MRI Patient presents with axial pain with possible radicular component.  In addition to any acute findings, please report on:  1. Facet (Zygapophyseal) joint DJD (Hypertrophy, space narrowing, subchondral sclerosis, and/or osteophyte formation) 2. DDD and/or IVDD (Loss of disc height, desiccation or "Black disc disease") 3. Pars defects 4. Spondylolisthesis, spondylosis, and/or spondyloarthropathies (include Degree/Grade of displacement in mm) 5. Vertebral body Fractures, including age (old, new/acute) 74. Modic Type Changes 7. Demineralization 8. Bone pathology 9. Central, Lateral Recess, and/or Foraminal Stenosis (include AP diameter of stenosis in mm) 10. Surgical changes (hardware type, status, and presence of fibrosis) NOTE: Please specify level(s) and laterality.    Standing Status:   Future    Standing Expiration Date:   11/06/2020    Scheduling Instructions:     Imaging must be done as soon as possible. Inform patient that order will expire within 30 days and I will not renew it.    Order Specific Question:  What is the patient's sedation requirement?    Answer:   No Sedation    Order Specific Question:   Does the patient have a pacemaker or implanted devices?    Answer:   No    Order Specific Question:   Preferred imaging location?    Answer:   [facility] (table limit-350lbs)    Order Specific Question:   Call Results- Best Contact Number?    Answer:   [phone] ([facility])    Order Specific Question:   Radiology Contrast Protocol - do NOT remove file path    Answer:   [email or web address]   Follow-up plan:   Return for Procedure (w/  sedation): (R) CESI #2.      Interventional Therapies  Risk  Complexity Considerations:   NOTE: (IODINE) SHELLFISH & CONTRAST ALLERGY!! (ANAPHYLACTIC)    Planned  Pending:   Pending further evaluation   Under consideration:      Completed:   Diagnostic bilateral lumbar spinal cord stimulator trial (08/18/2020)  Therapeutic/palliative left L3, L4 lumbar sympathetic block x6 (03/29/2020) (100/25/75/85) Diagnostic bilateral cervical facet MBB x1 (03/12/2019) (90/90/25 x2 weeks/0)  Diagnostic bilateral Thoracic T7, T8, T9, & T10 Facet MBB x1 (01/08/2019) (80/100/100)  Diagnostic/therapeutic left CESI x1 (12/04/2018) (100/100/100)  Diagnostic/therapeutic right CESI x1 (04/19/2020) (70/70/0/0)  Diagnostic bilateral lumbar facet block x2 (04/14/2019) (100/100/100 x 2 days/0)  Palliative left lumbar facet RFA x1 (05/19/2019) (100/100/80/> 75)  Palliative right lumbar facet RFA x1 (06/30/2019) (100/100/80/> 75)    Therapeutic  Palliative (PRN) options:   Therapeutic/palliative left lumbar sympathetic block #7  Diagnostic bilateral cervical facet block #2  Diagnostic bilateral Thoracic T7, T8, T9, & T10 Facet MBB #2  Therapeutic left CESI #2  Therapeutic right CESI #2  Palliative bilateral lumbar facet MBB #3  Palliative left lumbar facet RFA #2  Palliative right lumbar facet RFA #1     Recent Visits Date Type Provider Dept  08/25/20 Office Visit Milinda Pointer, MD [facility]  08/18/20 Procedure visit Milinda Pointer, MD [facility]  Showing recent visits within past 90 days and meeting all other requirements Today's Visits Date Type Provider Dept  10/06/20 Telemedicine Milinda Pointer, MD [facility]  Showing today's visits and meeting all other requirements Future Appointments No visits were found meeting these conditions. Showing future appointments within next 90 days and meeting all other requirements  I discussed the assessment and  treatment plan with the patient. The patient was provided an opportunity to ask questions and all were answered. The patient agreed with the plan and demonstrated an understanding of the instructions.  Patient advised to call back or seek an in-person evaluation if the symptoms or condition worsens.  Duration of encounter: 18 minutes.  Note by: Samantha Cola, MD Date: 10/06/2020; Time: 5:08 PM

## 2020-10-12 ENCOUNTER — Other Ambulatory Visit: Payer: Self-pay

## 2020-10-12 ENCOUNTER — Other Ambulatory Visit
Admission: RE | Admit: 2020-10-12 | Discharge: 2020-10-12 | Disposition: A | Discharge status: Home / Self Care | Payer: Medicare Other | Source: Ambulatory Visit | Attending: Neurosurgery | Admitting: Neurosurgery

## 2020-10-12 DIAGNOSIS — Z01818 Encounter for other preprocedural examination: Secondary | ICD-10-CM | POA: Diagnosis present

## 2020-10-12 DIAGNOSIS — Z01810 Encounter for preprocedural cardiovascular examination: Secondary | ICD-10-CM | POA: Diagnosis not present

## 2020-10-12 LAB — URINALYSIS, ROUTINE W REFLEX MICROSCOPIC
Bilirubin Urine: NEGATIVE
Glucose, UA: NEGATIVE mg/dL
Hgb urine dipstick: NEGATIVE
Ketones, ur: NEGATIVE mg/dL
Nitrite: NEGATIVE
Protein, ur: NEGATIVE mg/dL
Specific Gravity, Urine: 1.008 (ref 1.005–1.030)
pH: 7 (ref 5.0–8.0)

## 2020-10-12 LAB — BASIC METABOLIC PANEL
Anion gap: 11 (ref 5–15)
BUN: 12 mg/dL (ref 6–20)
CO2: 26 mmol/L (ref 22–32)
Calcium: 8.4 mg/dL — ABNORMAL LOW (ref 8.9–10.3)
Chloride: 98 mmol/L (ref 98–111)
Creatinine, Ser: 0.97 mg/dL (ref 0.44–1.00)
GFR, Estimated: >60 mL/min (ref >60)
Glucose, Bld: 94 mg/dL (ref 70–99)
Potassium: 3.4 mmol/L — ABNORMAL LOW (ref 3.5–5.1)
Sodium: 135 mmol/L (ref 135–145)

## 2020-10-12 LAB — CBC
HCT: 40 % (ref 36.0–46.0)
Hemoglobin: 14.1 g/dL (ref 12.0–15.0)
MCH: 30.5 pg (ref 26.0–34.0)
MCHC: 35.3 g/dL (ref 30.0–36.0)
MCV: 86.4 fL (ref 80.0–100.0)
Platelets: 203 10*3/uL (ref 150–400)
RBC: 4.63 MIL/uL (ref 3.87–5.11)
RDW: 12.6 % (ref 11.5–15.5)
WBC: 8.3 10*3/uL (ref 4.0–10.5)
nRBC: 0 % (ref 0.0–0.2)

## 2020-10-12 LAB — TYPE AND SCREEN
ABO/RH(D): B POS
Antibody Screen: NEGATIVE

## 2020-10-12 LAB — APTT: aPTT: 27 seconds (ref 24–36)

## 2020-10-12 LAB — SURGICAL PCR SCREEN
MRSA, PCR: NEGATIVE
Staphylococcus aureus: NEGATIVE

## 2020-10-12 LAB — PROTIME-INR
INR: 0.9 (ref 0.8–1.2)
Prothrombin Time: 12.2 seconds (ref 11.4–15.2)

## 2020-10-12 NOTE — Patient Instructions (Addendum)
Your procedure is scheduled on: Monday Oct 17, 2020. Report to Day Surgery inside [facility] 2nd floor (stop by admissions desk first before getting on elevator). To find out your arrival time please call [phone] between 1PM - 3PM on Friday Oct 14, 2020.  Remember: Instructions that are not followed completely may result in serious medical risk,  up to and including death, or upon the discretion of your surgeon and anesthesiologist your  surgery may need to be rescheduled.     _X__ 1. Do not eat food after midnight the night before your procedure.                 No chewing gum or hard candies. You may drink clear liquids up to 2 hours                 before you are scheduled to arrive for your surgery- DO not drink clear                 liquids within 2 hours of the start of your surgery.                 Clear Liquids include:  water, apple juice without pulp, clear Gatorade, G2 or                  Gatorade Zero (avoid Red/Purple/Blue), Black Coffee or Tea (Do not add                 anything to coffee or tea)..  __X__2.  On the morning of surgery brush your teeth with toothpaste and water, you                may rinse your mouth with mouthwash if you wish.  Do not swallow any toothpaste of mouthwash.     _X__ 3.  No Alcohol for 24 hours before or after surgery.   _X__ 4.  Do Not Smoke or use e-cigarettes For 24 Hours Prior to Your Surgery.                 Do not use any chewable tobacco products for at least 6 hours prior to                 Surgery.  _X__  5.  Do not use any recreational drugs (marijuana, cocaine, heroin, ecstasy, MDMA or other)                For at least one week prior to your surgery.  Combination of these drugs with anesthesia                May have life threatening results.  __X__6.  Notify your doctor if there is any change in your medical condition      (cold, fever, infections).     Do not wear jewelry, make-up, hairpins,  clips or nail polish. Do not wear lotions, powders, or perfumes. You may wear deodorant. Do not shave 48 hours prior to surgery. Men may shave face and neck. Do not bring valuables to the hospital.    [facility] is not responsible for any belongings or valuables.  Contacts, dentures or bridgework may not be worn into surgery. Leave your suitcase in the car. After surgery it may be brought to your room. For patients admitted to the hospital, discharge time is determined by your treatment team.   Patients discharged the day of surgery will not be allowed to drive home.  Make arrangements for someone to be with you for the first 24 hours of your Same Day Discharge.   __X__ Take these medicines the morning of surgery with A SIP OF WATER:      1. buPROPion (WELLBUTRIN XL) 300 MG   2. pantoprazole (PROTONIX) 40 MG   3. pregabalin (LYRICA) 200 MG  4. sertraline (ZOLOFT) 50 MG     ____ Fleet Enema (as directed)   __X__ Use CHG Soap (or wipes) as directed  ____ Use Benzoyl Peroxide Gel as instructed  __X__ Use inhalers on the day of surgery  albuterol (PROVENTIL HFA;VENTOLIN HFA) 108 (90 Base) MCG/ACT inhaler  umeclidinium-vilanterol (ANORO ELLIPTA) 62.5-25 MCG/INH AEPB  ____ Stop metformin 2 days prior to surgery    ____ Take 1/2 of usual insulin dose the night before surgery. No insulin the morning          of surgery.   ____ Call your PCP, cardiologist, or Pulmonologist if taking Coumadin/Plavix/aspirin and ask when to stop before your surgery.   __X__ One Week prior to surgery- Stop Anti-inflammatories such as Ibuprofen, Aleve, Advil, Motrin, meloxicam (MOBIC), diclofenac, etodolac, ketorolac, Toradol, Daypro, piroxicam, Goody's or BC powders. OK TO USE TYLENOL IF NEEDED   __X__ Stop supplements until after surgery. Multiple Vitamins-Minerals, cyanocobalamin 1000 MCG, Cholecalciferol (VITAMIN D), and Caffeine-Magnesium Salicylate    ____ Bring C-Pap to the hospital.     If you have any questions regarding your pre-procedure instructions,  Please call Pre-admit Testing at [phone].

## 2020-10-12 NOTE — Patient Instructions (Signed)
Your procedure is scheduled on: Monday Oct 17, 2020. Report to Day Surgery inside [facility] 2nd floor (stop by admissions desk first before getting on elevator). To find out your arrival time please call [phone] between 1PM - 3PM on Friday Oct 14, 2020.  Remember: Instructions that are not followed completely may result in serious medical risk,  up to and including death, or upon the discretion of your surgeon and anesthesiologist your  surgery may need to be rescheduled.     _X__ 1. Do not eat food after midnight the night before your procedure.                 No chewing gum or hard candies. You may drink clear liquids up to 2 hours                 before you are scheduled to arrive for your surgery- DO not drink clear                 liquids within 2 hours of the start of your surgery.                 Clear Liquids include:  water, apple juice without pulp, clear Gatorade, G2 or                  Gatorade Zero (avoid Red/Purple/Blue), Black Coffee or Tea (Do not add                 anything to coffee or tea).  __X__2.  On the morning of surgery brush your teeth with toothpaste and water, you                may rinse your mouth with mouthwash if you wish.  Do not swallow any toothpaste of mouthwash.     _X__ 3.  No Alcohol for 24 hours before or after surgery.   _X__ 4.  Do Not Smoke or use e-cigarettes For 24 Hours Prior to Your Surgery.                 Do not use any chewable tobacco products for at least 6 hours prior to                 Surgery.  _X__  5.  Do not use any recreational drugs (marijuana, cocaine, heroin, ecstasy, MDMA or other)                For at least one week prior to your surgery.  Combination of these drugs with anesthesia                May have life threatening results.  __X__6.  Notify your doctor if there is any change in your medical condition      (cold, fever, infections).     Do not wear jewelry, make-up, hairpins,  clips or nail polish. Do not wear lotions, powders, or perfumes. You may wear deodorant. Do not shave 48 hours prior to surgery. Men may shave face and neck. Do not bring valuables to the hospital.    [facility] is not responsible for any belongings or valuables.  Contacts, dentures or bridgework may not be worn into surgery. Leave your suitcase in the car. After surgery it may be brought to your room. For patients admitted to the hospital, discharge time is determined by your treatment team.   Patients discharged the day of surgery will not be allowed to drive home.  Make arrangements for someone to be with you for the first 24 hours of your Same Day Discharge.   __X__ Take these medicines the morning of surgery with A SIP OF WATER:    1. ALPRAZolam (XANAX) 0.5 MG  2. buPROPion (WELLBUTRIN XL) 300 MG  3. pregabalin (LYRICA) 200 MG  4. pantoprazole (PROTONIX) 40 MG  5. sertraline (ZOLOFT) 50 MG tablet  6.  ____ Fleet Enema (as directed)   __X__ Use CHG Soap (or wipes) as directed  ____ Use Benzoyl Peroxide Gel as instructed  __X__ Use inhalers on the day of surgery  albuterol (PROVENTIL HFA;VENTOLIN HFA) 108 (90 Base) MCG/ACT inhaler  SPIRIVA HANDIHALER 18 MCG inhalation   umeclidinium-vilanterol (ANORO ELLIPTA) 62.5-25 MCG/INH AEPB ____ Stop metformin 2 days prior to surgery    ____ Take 1/2 of usual insulin dose the night before surgery. No insulin the morning          of surgery.   ____ Call your PCP, cardiologist, or Pulmonologist if taking Coumadin/Plavix/aspirin and ask when to stop before your surgery.   __X__ One Week prior to surgery- Stop Anti-inflammatories such as Ibuprofen, Aleve, Advil, Motrin, meloxicam (MOBIC), diclofenac, etodolac, ketorolac, Toradol, Daypro, piroxicam, Goody's or BC powders. OK TO USE TYLENOL IF NEEDED   __X__ Stop supplements until after surgery. Multiple Vitamins-Minerals, cyanocobalamin 1000 MCG, Cholecalciferol (VITAMIN D and  Caffeine-Magnesium Salicylate   ____ Bring C-Pap to the hospital.    If you have any questions regarding your pre-procedure instructions,  Please call Pre-admit Testing at [phone].

## 2020-10-14 ENCOUNTER — Emergency Department: Payer: Medicare Other

## 2020-10-14 ENCOUNTER — Emergency Department
Admission: EM | Admit: 2020-10-14 | Discharge: 2020-10-14 | Disposition: A | Discharge status: Home / Self Care | Payer: Medicare Other | Attending: Emergency Medicine | Admitting: Emergency Medicine

## 2020-10-14 ENCOUNTER — Encounter: Payer: Self-pay | Admitting: Emergency Medicine

## 2020-10-14 ENCOUNTER — Other Ambulatory Visit: Payer: Self-pay

## 2020-10-14 DIAGNOSIS — Z79899 Other long term (current) drug therapy: Secondary | ICD-10-CM | POA: Diagnosis not present

## 2020-10-14 DIAGNOSIS — W19XXXA Unspecified fall, initial encounter: Secondary | ICD-10-CM

## 2020-10-14 DIAGNOSIS — S52501A Unspecified fracture of the lower end of right radius, initial encounter for closed fracture: Secondary | ICD-10-CM | POA: Diagnosis not present

## 2020-10-14 DIAGNOSIS — I129 Hypertensive chronic kidney disease with stage 1 through stage 4 chronic kidney disease, or unspecified chronic kidney disease: Secondary | ICD-10-CM | POA: Insufficient documentation

## 2020-10-14 DIAGNOSIS — J449 Chronic obstructive pulmonary disease, unspecified: Secondary | ICD-10-CM | POA: Diagnosis not present

## 2020-10-14 DIAGNOSIS — W109XXA Fall (on) (from) unspecified stairs and steps, initial encounter: Secondary | ICD-10-CM | POA: Insufficient documentation

## 2020-10-14 DIAGNOSIS — Y92009 Unspecified place in unspecified non-institutional (private) residence as the place of occurrence of the external cause: Secondary | ICD-10-CM | POA: Insufficient documentation

## 2020-10-14 DIAGNOSIS — N183 Chronic kidney disease, stage 3 unspecified: Secondary | ICD-10-CM | POA: Diagnosis not present

## 2020-10-14 DIAGNOSIS — F0390 Unspecified dementia without behavioral disturbance: Secondary | ICD-10-CM | POA: Insufficient documentation

## 2020-10-14 DIAGNOSIS — S0181XA Laceration without foreign body of other part of head, initial encounter: Secondary | ICD-10-CM | POA: Diagnosis not present

## 2020-10-14 DIAGNOSIS — Y9301 Activity, walking, marching and hiking: Secondary | ICD-10-CM | POA: Diagnosis not present

## 2020-10-14 DIAGNOSIS — I2510 Atherosclerotic heart disease of native coronary artery without angina pectoris: Secondary | ICD-10-CM | POA: Insufficient documentation

## 2020-10-14 DIAGNOSIS — F17210 Nicotine dependence, cigarettes, uncomplicated: Secondary | ICD-10-CM | POA: Insufficient documentation

## 2020-10-14 DIAGNOSIS — S6991XA Unspecified injury of right wrist, hand and finger(s), initial encounter: Secondary | ICD-10-CM | POA: Diagnosis present

## 2020-10-14 LAB — CBC WITH DIFFERENTIAL/PLATELET
Abs Immature Granulocytes: 0.02 10*3/uL (ref 0.00–0.07)
Basophils Absolute: 0.1 10*3/uL (ref 0.0–0.1)
Basophils Relative: 1 %
Eosinophils Absolute: 0.2 10*3/uL (ref 0.0–0.5)
Eosinophils Relative: 3 %
HCT: 41 % (ref 36.0–46.0)
Hemoglobin: 14.2 g/dL (ref 12.0–15.0)
Immature Granulocytes: 0 %
Lymphocytes Relative: 43 %
Lymphs Abs: 3 10*3/uL (ref 0.7–4.0)
MCH: 30.1 pg (ref 26.0–34.0)
MCHC: 34.6 g/dL (ref 30.0–36.0)
MCV: 86.9 fL (ref 80.0–100.0)
Monocytes Absolute: 0.8 10*3/uL (ref 0.1–1.0)
Monocytes Relative: 11 %
Neutro Abs: 3 10*3/uL (ref 1.7–7.7)
Neutrophils Relative %: 42 %
Platelets: 234 10*3/uL (ref 150–400)
RBC: 4.72 MIL/uL (ref 3.87–5.11)
RDW: 12.6 % (ref 11.5–15.5)
WBC: 7 10*3/uL (ref 4.0–10.5)
nRBC: 0 % (ref 0.0–0.2)

## 2020-10-14 LAB — BASIC METABOLIC PANEL
Anion gap: 11 (ref 5–15)
BUN: 19 mg/dL (ref 6–20)
CO2: 26 mmol/L (ref 22–32)
Calcium: 9.1 mg/dL (ref 8.9–10.3)
Chloride: 96 mmol/L — ABNORMAL LOW (ref 98–111)
Creatinine, Ser: 1.27 mg/dL — ABNORMAL HIGH (ref 0.44–1.00)
GFR, Estimated: 49 mL/min — ABNORMAL LOW (ref >60)
Glucose, Bld: 94 mg/dL (ref 70–99)
Potassium: 3.4 mmol/L — ABNORMAL LOW (ref 3.5–5.1)
Sodium: 133 mmol/L — ABNORMAL LOW (ref 135–145)

## 2020-10-14 MED ORDER — SODIUM CHLORIDE 0.9 % IV BOLUS
1000.0000 mL | Freq: Once | INTRAVENOUS | Status: AC
  Administered 2020-10-14: 1000 mL via INTRAVENOUS

## 2020-10-14 MED ORDER — ONDANSETRON HCL 4 MG PO TABS: 4.0000 mg | ORAL_TABLET | Freq: Three times a day (TID) | ORAL | 0 refills | Status: AC | PRN

## 2020-10-14 MED ORDER — FENTANYL CITRATE (PF) 100 MCG/2ML IJ SOLN
50.0000 ug | Freq: Once | INTRAMUSCULAR | Status: AC
  Administered 2020-10-14: 50 ug via INTRAVENOUS
  Filled 2020-10-14: qty 2

## 2020-10-14 MED ORDER — LIDOCAINE HCL (PF) 1 % IJ SOLN
5.0000 mL | Freq: Once | INTRAMUSCULAR | Status: AC
  Administered 2020-10-14: 5 mL via INTRADERMAL
  Filled 2020-10-14: qty 5

## 2020-10-14 MED ORDER — BUPIVACAINE HCL (PF) 0.5 % IJ SOLN
20.0000 mL | Freq: Once | INTRAMUSCULAR | Status: AC
  Administered 2020-10-14: 20 mL
  Filled 2020-10-14: qty 30

## 2020-10-14 MED ORDER — OXYCODONE HCL 5 MG PO TABS: 5.0000 mg | ORAL_TABLET | Freq: Three times a day (TID) | ORAL | 0 refills | Status: DC | PRN

## 2020-10-14 NOTE — ED Provider Notes (Signed)
[facility] Emergency Department Provider Note  ____________________________________________   Event Date/Time   First MD Initiated Contact with Patient 10/14/20 1107     (approximate)  I have reviewed the triage vital signs and the nursing notes.   HISTORY  Chief Complaint Fall    HPI Samantha Macias is a 60 y.o. female with history of bipolar disorder, coronary disease, chronic pain and fibromyalgia, here with right wrist pain after mechanical fall.  The patient states she was walking in her daughter's house.  She is currently living with her.  She has a history of recurrent falls and states she just loses her balance.  She tripped on the last stair, falling forward.  She struck her head but did not lose consciousness.  She sustained an injury to her right wrist.  Reports 10 out of 10 aching, throbbing, right wrist pain since the fall.  Denies any alleviating factors.  No numbness or weakness.  No other areas of pain other than mild pain around the laceration of her right forehead.  No other complaints.        Past Medical History:  Diagnosis Date  . Abnormal gait   . Allergy   . Anxiety   . Arthritis   . Back pain   . Bipolar disorder ([place])   . CAD (coronary artery disease)   . Chronic kidney disease   . Collagen vascular disease ([place])   . Colon polyp   . COPD (chronic obstructive pulmonary disease) ([place])   . Falls   . Fibromyalgia   . GERD (gastroesophageal reflux disease)    erosive gastritis per EGD 07/2017  . Hyperlipidemia   . Hypertension   . IBS (irritable bowel syndrome)   . Kidney disease   . Migraine   . Numbness and tingling    hand and legs  . Renal insufficiency   . Spinal stenosis   . Static encephalopathy   . Thyroid nodule 07/2017   several nodules being followed by Dr. Gabriel Carina  . TIA (transient ischemic attack)     Patient Active Problem List   Diagnosis Date Noted  . Epidural lipomatosis 08/25/2020  . S/P  insertion of spinal cord stimulator 08/18/2020  . Lumbar lateral recess stenosis (L4-5) (Right) 12/22/2019  . Lumbosacral foraminal stenosis (Left: L2, L5) (Bilateral: L4) 12/22/2019  . Lumbar foraminal annular tear (Left: L2-3, L4-5) 12/22/2019  . IVDD (Displacement of intervertebral disc) of lumbosacral region 12/22/2019  . CRPS 1 (complex regional pain syndrome I) of lower limb (Left) 10/27/2019  . Broken foot 07/28/2019  . Neurogenic pain 07/16/2019  . Chronic midline thoracic back pain 07/16/2019  . Malaise and fatigue 05/21/2019  . Chronic midline low back pain with left-sided sciatica 05/21/2019  . Panic anxiety syndrome 05/21/2019  . Spondylosis without myelopathy or radiculopathy, lumbosacral region 05/19/2019  . History of allergy to radiographic contrast media 05/19/2019  . History of anaphylaxis 05/19/2019  . Chronic low back pain (Bilateral) (L>R) w/o sciatica 04/27/2019  . Cervicalgia (Bilateral) 02/26/2019  . History of allergy to shellfish 02/26/2019  . Spondylosis without myelopathy or radiculopathy, thoracic region 01/08/2019  . Dementia ([place]) 01/02/2019  . SLE (systemic lupus erythematosus related syndrome) ([place]) 01/02/2019  . Vitamin B 12 deficiency 01/02/2019  . Thoracic facet syndrome 12/18/2018  . Other specified spondylopathies, lumbosacral region ([place]) 11/12/2018  . Preop testing 10/30/2018  . Swelling of both hands 09/10/2018  . Hallucinations, visual 07/03/2018  . Imbalance 07/03/2018  . Cigarette nicotine dependence  with nicotine-induced disorder 07/03/2018  . Memory loss or impairment 07/03/2018  . Abnormal MRI, cervical spine (04/16/2019) 04/03/2018  . Abnormal MRI, lumbar spine (04/16/2019) 04/03/2018  . DDD (degenerative disc disease), lumbar 04/03/2018  . Lumbar facet hypertrophy (Multilevel) (Bilateral) 04/03/2018  . Lumbar facet syndrome (Bilateral) 04/03/2018  . Cervical facet hypertrophy (Bilateral) 04/03/2018  . Cervical facet syndrome  (Bilateral) 04/03/2018  . Cervical foraminal stenosis 04/03/2018  . Lumbar foraminal stenosis (Left: L2, L5) (Bilateral: L4) 04/03/2018  . Cervical central spinal stenosis 04/03/2018  . Lumbar central spinal stenosis (L4-5) 04/03/2018  . Anterolisthesis 04/03/2018  . Grade 1 Retrolisthesis of L5/S1 04/03/2018  . Abnormal findings on diagnostic imaging of other parts of musculoskeletal system 04/03/2018  . Spondylosis without myelopathy or radiculopathy, cervical region 04/03/2018  . Chest pain 03/17/2018  . Chronic low back pain (1ry area of Pain) (Bilateral) (L>R) w/ sciatica 03/17/2018  . Chronic lower extremity pain (2ry area of Pain) (Bilateral) (L>R) 03/17/2018  . Chronic upper back pain (3ry area of Pain) (Bilateral) (R>L) 03/17/2018  . Chronic hand pain (Fourth Area of Pain) (Bilateral) (R>L) 03/17/2018  . Chronic knee pain (Left) 03/17/2018  . Pharmacologic therapy 03/17/2018  . Disorder of skeletal system 03/17/2018  . Problems influencing health status 03/17/2018  . Long term current use of opiate analgesic 03/17/2018  . Chronic hip pain (Left) 02/19/2018  . Cervical spondylosis with myelopathy 07/02/2017  . Cervical myelopathy ([place]) 06/27/2017  . Lightheadedness 03/04/2017  . Sleeping difficulty 03/04/2017  . Mild cognitive disorder 11/21/2016  . Mild cognitive impairment, so stated 11/21/2016  . Cardiac syncope 11/16/2016  . Precordial pain 11/16/2016  . COPD, moderate ([place]) 10/09/2016  . Anterior chest wall pain 09/19/2016  . Tobacco user 05/09/2016  . Insomnia secondary to depression with anxiety 04/17/2016  . Loss of memory 04/11/2016  . DDD (degenerative disc disease), cervical 03/29/2016  . Vitamin D deficiency 03/28/2016  . HLD (hyperlipidemia) 03/28/2016  . Anxiety 03/28/2016  . Connective tissue disease ([place]) 02/17/2016  . Arthralgia of multiple joints 02/17/2016  . Thyromegaly 08/18/2015  . Thyroid cyst 08/18/2015  . Osteoarthritis of both hands  08/01/2015  . Benign essential HTN 07/01/2015  . Hypertension, benign 07/01/2015  . Hyperparathyroidism, secondary renal ([place]) 12/03/2014  . Allergic rhinitis 12/01/2014  . Anemia in chronic illness 12/01/2014  . GAD (generalized anxiety disorder) 12/01/2014  . Bipolar affective disorder, currently depressed, mild ([place]) 12/01/2014  . Atherosclerosis of left carotid artery 12/01/2014  . Bilateral carpal tunnel syndrome 12/01/2014  . CKD (chronic kidney disease) stage 3, GFR 30-59 ml/min (HCC) 12/01/2014  . Insomnia, persistent 12/01/2014  . Chronic pain syndrome 12/01/2014  . CN (constipation) 12/01/2014  . Kidney cysts 12/01/2014  . Cervical osteoarthritis 12/01/2014  . Dyslipidemia 12/01/2014  . Fibromyalgia syndrome 12/01/2014  . Abnormal gait 12/01/2014  . Gastro-esophageal reflux disease without esophagitis 12/01/2014  . History of colon polyps 12/01/2014  . History of syncope 12/01/2014  . H/O transient cerebral ischemia 12/01/2014  . HPV test positive 12/01/2014  . Recurrent falls 12/01/2014  . Calculus of kidney 12/01/2014  . Lumbar radiculopathy 12/01/2014  . OCD (obsessive compulsive disorder) 12/01/2014  . Overweight 12/01/2014  . PTSD (post-traumatic stress disorder) 12/01/2014  . Trochanteric bursitis of hip (Bilateral) 12/01/2014  . Type III hyperlipoproteinemia 02/19/2014    Past Surgical History:  Procedure Laterality Date  . COLONOSCOPY WITH PROPOFOL N/A 04/01/2017   Procedure: COLONOSCOPY WITH PROPOFOL;  Surgeon: Lollie Sails, MD;  Location[facility] ENDOSCOPY;  Service: Endoscopy;  Laterality: N/A;  . DENTAL SURGERY  2019  . ESOPHAGOGASTRODUODENOSCOPY (EGD) WITH PROPOFOL N/A 04/01/2017   Procedure: ESOPHAGOGASTRODUODENOSCOPY (EGD) WITH PROPOFOL;  Surgeon: Lollie Sails, MD;  Location[facility] ENDOSCOPY;  Service: Endoscopy;  Laterality: N/A;  . MOUTH SURGERY    . NASAL SINUS SURGERY    . TUBAL LIGATION      Prior to Admission medications    Medication Sig Start Date End Date Taking? Authorizing Provider  ondansetron (ZOFRAN) 4 MG tablet Take 1 tablet (4 mg total) by mouth every 8 (eight) hours as needed for nausea or vomiting. 10/14/20 10/14/21 Yes Duffy Bruce, MD  oxyCODONE (ROXICODONE) 5 MG immediate release tablet Take 1 tablet (5 mg total) by mouth every 8 (eight) hours as needed for breakthrough pain (in addition to your usual medications). 10/14/20 10/14/21 Yes Duffy Bruce, MD  albuterol (PROVENTIL HFA;VENTOLIN HFA) 108 (90 Base) MCG/ACT inhaler Inhale 2 puffs into the lungs every 6 (six) hours as needed for wheezing or shortness of breath.    [provider]  ALPRAZolam Duanne Moron) 0.5 MG tablet Take 0.5 mg by mouth 2 (two) times daily as needed. 02/16/20   [provider]  buPROPion (WELLBUTRIN XL) 300 MG 24 hr tablet Take 1 tablet (300 mg total) by mouth daily. 11/14/19   Cloria Spring, MD  Caffeine-Magnesium Salicylate ([place]) XX123456 MG TABS Take by mouth daily. Patient not taking: Reported on 10/12/2020    [provider]  cephALEXin (KEFLEX) 500 MG capsule Take 500 mg by mouth 3 (three) times daily.    [provider]  Cholecalciferol (VITAMIN D) 50 MCG (2000 UT) CAPS Take by mouth daily.    [provider]  cyanocobalamin 1000 MCG tablet Take by mouth. 03/11/20 03/11/21  [provider]  diclofenac sodium (VOLTAREN) 1 % GEL Apply 4 g topically 4 (four) times daily as needed. Patient taking differently: Apply 4 g topically 4 (four) times daily as needed. For pain 03/27/16   Karamalegos, Devonne Doughty, DO  diphenhydramine-acetaminophen (TYLENOL PM EXTRA STRENGTH) 25-500 MG TABS tablet Take 2 tablets by mouth at bedtime as needed.    [provider]  donepezil (ARICEPT) 10 MG tablet Take 10 mg by mouth at bedtime.    [provider]  HYDROcodone-acetaminophen (NORCO/VICODIN) 5-325 MG tablet Take 1 tablet by mouth 3 (three) times daily as needed.  07/30/19    [provider]  losartan-hydrochlorothiazide (HYZAAR) 100-12.5 MG tablet Take 1 tablet by mouth daily. 12/01/15   Steele Sizer, MD  Melatonin 5 MG CHEW Chew by mouth.    [provider]  meloxicam (MOBIC) 15 MG tablet TAKE 1 TABLET BY MOUTH ONCE DAILY WITH FOOD 10/29/19   [provider]  Multiple Vitamins-Minerals (CENTRUM SILVER ADULT 50+ PO) Take 1 tablet by mouth daily. Patient not taking: Reported on 10/12/2020    [provider]  pantoprazole (PROTONIX) 40 MG tablet Take 40 mg by mouth daily. 07/15/17   [provider]  pregabalin (LYRICA) 200 MG capsule Take 1 capsule (200 mg total) by mouth 3 (three) times daily. 04/12/20 10/09/20  Milinda Pointer, MD  RYBELSUS 14 MG TABS Take 1 tablet by mouth every morning. 02/09/20   [provider]  Semaglutide (RYBELSUS) 14 MG TABS Take 1 capsule by mouth daily.  03/09/20 03/09/21  [provider]  sertraline (ZOLOFT) 50 MG tablet Take 1 tablet (50 mg total) by mouth daily. 11/14/19   Cloria Spring, MD  SPIRIVA HANDIHALER 18 MCG inhalation capsule Place 1  puff into inhaler and inhale daily. 05/23/17   [provider]  temazepam (RESTORIL) 30 MG capsule TAKE 1 CAPSULE BY MOUTH AT BEDTIME AS NEEDED SLEEP Patient not taking: Reported on 10/12/2020 02/01/20   Cloria Spring, MD  tiZANidine (ZANAFLEX) 4 MG capsule Take 4 mg by mouth 3 (three) times daily as needed for muscle spasms.    [provider]  umeclidinium-vilanterol (ANORO ELLIPTA) 62.5-25 MCG/INH AEPB Inhale 1 puff into the lungs as needed. 07/18/18   [provider]    Allergies Contrast media [iodinated diagnostic agents], Shellfish allergy, Tetanus toxoid adsorbed, Ace inhibitors, Codeine, Nitrofurantoin monohyd macro, and Red dye  Family History  Problem Relation Age of Onset  . Congestive Heart Failure Father   . Alcohol abuse Father   . Heart disease Father   . Heart attack Father   . Peripheral  vascular disease Mother   . Anxiety disorder Mother   . Depression Mother   . Thyroid disease Mother   . Heart disease Mother   . Mental illness Mother   . Bipolar disorder Mother     Social History Social History   Tobacco Use  . Smoking status: Current Every Day Smoker    Packs/day: 0.25    Years: 25.00    Pack years: 6.25    Types: Cigarettes  . Smokeless tobacco: Never Used  . Tobacco comment: 3 cig/day  Vaping Use  . Vaping Use: Never used  Substance Use Topics  . Alcohol use: Yes    Alcohol/week: 2.0 standard drinks    Types: 2 Glasses of wine per week    Comment: occasionally   . Drug use: Yes    Types: Marijuana    Comment: negative UDS on 07/31/2017    Review of Systems  Review of Systems  Constitutional: Negative for chills and fever.  HENT: Negative for sore throat.   Respiratory: Negative for shortness of breath.   Cardiovascular: Negative for chest pain.  Gastrointestinal: Negative for abdominal pain.  Genitourinary: Negative for flank pain.  Musculoskeletal: Positive for arthralgias and gait problem. Negative for neck pain.  Skin: Positive for wound. Negative for rash.  Allergic/Immunologic: Negative for immunocompromised state.  Neurological: Negative for weakness and numbness.  Hematological: Does not bruise/bleed easily.  All other systems reviewed and are negative.    ____________________________________________  PHYSICAL EXAM:      VITAL SIGNS: ED Triage Vitals  Enc Vitals Group     BP 10/14/20 1059 (!) 106/93     Pulse Rate 10/14/20 1059 86     Resp 10/14/20 1059 16     Temp 10/14/20 1059 (!) 97.4 F (36.3 C)     Temp Source 10/14/20 1059 Oral     SpO2 10/14/20 1059 97 %     Weight 10/14/20 1101 186 lb (84.4 kg)     Height 10/14/20 1101 5\' 6"  (1.676 m)     Head Circumference --      Peak Flow --      Pain Score 10/14/20 1100 9     Pain Loc --      Pain Edu? --      Excl. in [place]? --      Physical Exam Vitals and nursing note  reviewed.  Constitutional:      General: She is not in acute distress.    Appearance: She is well-developed.  HENT:     Head: Normocephalic and atraumatic.     Comments: Approximately 1 cm linear, superficial, healing laceration  to the right upper forehead.  No active bleeding. Eyes:     Conjunctiva/sclera: Conjunctivae normal.  Cardiovascular:     Rate and Rhythm: Normal rate and regular rhythm.     Heart sounds: Normal heart sounds.  Pulmonary:     Effort: Pulmonary effort is normal. No respiratory distress.     Breath sounds: No wheezing.  Abdominal:     General: There is no distension.  Musculoskeletal:     Cervical back: Neck supple.     Comments: Mild deformity to the right wrist with significant tenderness throughout the distal radius and ulna.  No open wounds.  Mild volar angulation.  Distal strength and sensation is fully intact.  Interossei, wrist extension and flexion fully intact.  Normal sensation to light touch and pinprick.  2+ radial pulse.  Skin:    General: Skin is warm.     Capillary Refill: Capillary refill takes less than 2 seconds.     Findings: No rash.  Neurological:     Mental Status: She is alert and oriented to person, place, and time.     Motor: No abnormal muscle tone.       ____________________________________________   LABS (all labs ordered are listed, but only abnormal results are displayed)  Labs Reviewed  BASIC METABOLIC PANEL - Abnormal; Notable for the following components:      Result Value   Sodium 133 (*)    Potassium 3.4 (*)    Chloride 96 (*)    Creatinine, Ser 1.27 (*)    GFR, Estimated 49 (*)    All other components within normal limits  CBC WITH DIFFERENTIAL/PLATELET  URINALYSIS, COMPLETE (UACMP) WITH MICROSCOPIC    ____________________________________________  EKG:  ________________________________________  RADIOLOGY All imaging, including plain films, CT scans, and ultrasounds, independently reviewed by me, and  interpretations confirmed via formal radiology reads.  ED MD interpretation:   CT head/C-spine: Chronic changes, forehead laceration and contusion, no intracranial abnormality.  No acute cervical spine injury. X-ray radius: Distal radius and ulnar styloid fracture  Official radiology report(s): DG Wrist Complete Right  Result Date: 10/14/2020 CLINICAL DATA:  Fall last night, deformity. EXAM: RIGHT WRIST - COMPLETE 3+ VIEW COMPARISON:  None. FINDINGS: Slightly displaced/comminuted fracture of the distal RIGHT radius, with at least some degree of impaction at the fracture site and loss of the normal volar alignment at the radiocarpal joint space. Additional slightly displaced fracture at the base of the ulnar styloid process. Carpal bones appear intact and normally aligned. IMPRESSION: 1. Distal radius fracture with associated loss of the normal volar alignment at the radiocarpal joint space. 2. Slightly displaced fracture at the base of the ulnar styloid process. Electronically Signed   By: Bary RichardStan  Maynard M.D.   On: 10/14/2020 11:43   CT Head Wo Contrast  Result Date: 10/14/2020 CLINICAL DATA:  Head trauma.  Fall with head injury. EXAM: CT HEAD WITHOUT CONTRAST CT CERVICAL SPINE WITHOUT CONTRAST TECHNIQUE: Multidetector CT imaging of the head and cervical spine was performed following the standard protocol without intravenous contrast. Multiplanar CT image reconstructions of the cervical spine were also generated. COMPARISON:  Head CT dated 07/07/2015 FINDINGS: CT HEAD FINDINGS Brain: Ventricles are stable in size and configuration. There is no mass, hemorrhage, edema or other evidence of acute parenchymal abnormality. No extra-axial hemorrhage. Vascular: Chronic calcified atherosclerotic changes of the large vessels at the skull base. No unexpected hyperdense vessel. Skull: Normal. Negative for fracture or focal lesion. Sinuses/Orbits: No acute finding. Other: Focal scalp edema/laceration  overlying the  RIGHT frontal bone. No underlying fracture. CT CERVICAL SPINE FINDINGS Alignment: Mild levoscoliosis. Straightening of the normal cervical spine lordosis. No evidence of acute vertebral body subluxation. Skull base and vertebrae: No fracture line or displaced fracture fragment is seen. Soft tissues and spinal canal: No prevertebral fluid or swelling. No visible canal hematoma. Disc levels: Degenerative spondylosis at the C5-6 and C6-7 levels, mild to moderate in degree, but no more than mild central canal stenosis at any level. Additional degenerative hypertrophy of the uncovertebral and facet joints causing moderate to severe RIGHT neural foramen stenosis at C2-3, moderate to severe RIGHT neural foramen stenosis at C3-4, severe LEFT neural foramen stenosis at C4-5 and moderate bilateral neural foramen stenoses at C6-7. Upper chest: No acute findings. Other: Bilateral carotid atherosclerosis. IMPRESSION: 1. Focal scalp edema/laceration overlying the RIGHT frontal bone. No underlying fracture. 2. No acute intracranial abnormality. No intracranial mass, hemorrhage or edema. No skull fracture. 3. No fracture or acute subluxation within the cervical spine. 4. Degenerative changes of the cervical spine, as described above. Suspect associated nerve root impingement at 1 or more levels. Corresponding neural foramen stenoses were better demonstrated on earlier MRI of 05/17/2017. 5. Carotid atherosclerosis. Electronically Signed   By: Franki Cabot M.D.   On: 10/14/2020 11:50   CT Cervical Spine Wo Contrast  Result Date: 10/14/2020 CLINICAL DATA:  Head trauma.  Fall with head injury. EXAM: CT HEAD WITHOUT CONTRAST CT CERVICAL SPINE WITHOUT CONTRAST TECHNIQUE: Multidetector CT imaging of the head and cervical spine was performed following the standard protocol without intravenous contrast. Multiplanar CT image reconstructions of the cervical spine were also generated. COMPARISON:  Head CT dated 07/07/2015 FINDINGS: CT  HEAD FINDINGS Brain: Ventricles are stable in size and configuration. There is no mass, hemorrhage, edema or other evidence of acute parenchymal abnormality. No extra-axial hemorrhage. Vascular: Chronic calcified atherosclerotic changes of the large vessels at the skull base. No unexpected hyperdense vessel. Skull: Normal. Negative for fracture or focal lesion. Sinuses/Orbits: No acute finding. Other: Focal scalp edema/laceration overlying the RIGHT frontal bone. No underlying fracture. CT CERVICAL SPINE FINDINGS Alignment: Mild levoscoliosis. Straightening of the normal cervical spine lordosis. No evidence of acute vertebral body subluxation. Skull base and vertebrae: No fracture line or displaced fracture fragment is seen. Soft tissues and spinal canal: No prevertebral fluid or swelling. No visible canal hematoma. Disc levels: Degenerative spondylosis at the C5-6 and C6-7 levels, mild to moderate in degree, but no more than mild central canal stenosis at any level. Additional degenerative hypertrophy of the uncovertebral and facet joints causing moderate to severe RIGHT neural foramen stenosis at C2-3, moderate to severe RIGHT neural foramen stenosis at C3-4, severe LEFT neural foramen stenosis at C4-5 and moderate bilateral neural foramen stenoses at C6-7. Upper chest: No acute findings. Other: Bilateral carotid atherosclerosis. IMPRESSION: 1. Focal scalp edema/laceration overlying the RIGHT frontal bone. No underlying fracture. 2. No acute intracranial abnormality. No intracranial mass, hemorrhage or edema. No skull fracture. 3. No fracture or acute subluxation within the cervical spine. 4. Degenerative changes of the cervical spine, as described above. Suspect associated nerve root impingement at 1 or more levels. Corresponding neural foramen stenoses were better demonstrated on earlier MRI of 05/17/2017. 5. Carotid atherosclerosis. Electronically Signed   By: Franki Cabot M.D.   On: 10/14/2020 11:50     ____________________________________________  PROCEDURES   Procedure(s) performed (including Critical Care):  .Ortho Injury Treatment  Date/Time: 10/14/2020 12:49 PM Performed by: Duffy Bruce, MD Authorized by:  Duffy Bruce, MD   Consent:    Consent obtained:  Verbal   Consent given by:  Patient   Risks discussed:  Fracture, irreducible dislocation, nerve damage, restricted joint movement, vascular damage, stiffness and recurrent dislocation   Alternatives discussed:  Alternative treatmentInjury location: wrist Pre-procedure neurovascular assessment: neurovascularly intact Pre-procedure neurological function: normal Pre-procedure range of motion: reduced Anesthesia: hematoma block  Anesthesia: Local anesthesia used: yes Local Anesthetic: bupivacaine 0.5% without epinephrine Immobilization: splint Splint type: volar short arm Splint Applied by: ED Provider and ED Tech Supplies used: Ortho-Glass Post-procedure neurovascular assessment: post-procedure neurovascularly intact Post-procedure distal perfusion: normal Post-procedure neurological function: normal Post-procedure range of motion: improved     ____________________________________________  INITIAL IMPRESSION / MDM / ASSESSMENT AND PLAN / ED COURSE  As part of my medical decision making, I reviewed the following data within the [place] notes reviewed and incorporated, Old chart reviewed, Notes from prior ED visits, and [place] Controlled Substance Database       *Samantha Macias was evaluated in Emergency Department on 10/14/2020 for the symptoms described in the history of present illness. She was evaluated in the context of the global COVID-19 pandemic, which necessitated consideration that the patient might be at risk for infection with the SARS-CoV-2 virus that causes COVID-19. Institutional protocols and algorithms that pertain to the evaluation of patients at risk for  COVID-19 are in a state of rapid change based on information released by regulatory bodies including the CDC and federal and state organizations. These policies and algorithms were followed during the patient's care in the ED.  Some ED evaluations and interventions may be delayed as a result of limited staffing during the pandemic.*     Medical Decision Making: 60 year old female here with right wrist pain and superficial forehead laceration after mechanical fall.  Her head laceration is already healing.  She refuses tetanus as she is allergic.  Wound appears clean and well approximated and is already healing.  Will advise local antibiotic ointment and wound care.  Regarding her wrist, plain films show distal radius and ulna fracture with very mild displacement.  Case was discussed with Dr. Rudene Christians.  He recommends volar splint and follow-up in clinic today.  Patient is neurovascularly intact distally.  Compartments are soft.  After discussion of pain management options, hematoma block performed and patient splinted in a volar wrist splint.  Tolerated well.  Remains neurovascularly intact.  Discharged with orthopedic follow-up.  ____________________________________________  FINAL CLINICAL IMPRESSION(S) / ED DIAGNOSES  Final diagnoses:  Fall, initial encounter  Closed fracture of distal end of right radius, unspecified fracture morphology, initial encounter  Laceration of forehead, initial encounter     MEDICATIONS GIVEN DURING THIS VISIT:  Medications  sodium chloride 0.9 % bolus 1,000 mL (1,000 mLs Intravenous New Bag/Given 10/14/20 1157)  fentaNYL (SUBLIMAZE) injection 50 mcg (50 mcg Intravenous Given 10/14/20 1157)  bupivacaine (MARCAINE) 0.5 % injection 20 mL (20 mLs Infiltration Given by Other 10/14/20 1232)  lidocaine (PF) (XYLOCAINE) 1 % injection 5 mL (5 mLs Intradermal Given by Other 10/14/20 1232)     ED Discharge Orders         Ordered    ondansetron (ZOFRAN) 4 MG tablet  Every 8  hours PRN        10/14/20 1252    oxyCODONE (ROXICODONE) 5 MG immediate release tablet  Every 8 hours PRN        10/14/20 1252  Note:  This document was prepared using Dragon voice recognition software and may include unintentional dictation errors.   Duffy Bruce, MD 10/14/20 1253

## 2020-10-14 NOTE — Discharge Instructions (Addendum)
Go directly to Dr. Theodore Demark office for evaluation  Elevate your wrist and apply ice as often as able  Take your usual medications. I've prescribed a small amount of additional medication as needed, but you will need to discuss additional doses with your pain management specialist.

## 2020-10-14 NOTE — ED Notes (Signed)
Pt taken to xray 

## 2020-10-14 NOTE — ED Notes (Signed)
MSE read to pt, pt verbalized understanding of MSE. Pt unable to sign MSE due to being right handed and pt has obvious deformity to her right wrist.

## 2020-10-14 NOTE — ED Triage Notes (Signed)
Pt to ED from [facility]. Pt states that she was staying at her daughters house last night and was walking down the steps, pt missed the last step and slipped and fell, hitting her head on the shoe storage cabinet. Pt has dried blood on her head and has a deformity to her right wrist. Positive pulses present, pt has sensation in right arm, cap refill less than 3 seconds, color and temp is WNL. Pt is able to move all fingers.

## 2020-10-17 ENCOUNTER — Ambulatory Visit: Admission: RE | Admit: 2020-10-17 | Payer: Medicare Other | Source: Home / Self Care | Admitting: Neurosurgery

## 2020-10-17 ENCOUNTER — Encounter: Admission: RE | Payer: Self-pay | Source: Home / Self Care

## 2020-10-17 SURGERY — INSERTION, SPINAL CORD STIMULATOR, LUMBAR
Anesthesia: General

## 2020-10-21 ENCOUNTER — Ambulatory Visit: Payer: Medicare Other

## 2020-10-26 ENCOUNTER — Other Ambulatory Visit: Payer: Self-pay | Admitting: Orthopedic Surgery

## 2020-10-26 MED ORDER — CEFAZOLIN SODIUM-DEXTROSE 2-4 GM/100ML-% IV SOLN
2.0000 g | INTRAVENOUS | Status: AC
  Administered 2020-10-27: 2 g via INTRAVENOUS

## 2020-10-27 ENCOUNTER — Ambulatory Visit: Payer: Medicare Other

## 2020-10-27 ENCOUNTER — Ambulatory Visit
Admission: RE | Admit: 2020-10-27 | Discharge: 2020-10-27 | Disposition: A | Discharge status: Home / Self Care | Payer: Medicare Other | Attending: Orthopedic Surgery | Admitting: Orthopedic Surgery

## 2020-10-27 ENCOUNTER — Ambulatory Visit: Payer: Medicare Other | Admitting: Anesthesiology

## 2020-10-27 ENCOUNTER — Encounter
Admission: RE | Disposition: A | Discharge status: Home / Self Care | Payer: Self-pay | Source: Home / Self Care | Attending: Orthopedic Surgery

## 2020-10-27 DIAGNOSIS — Z885 Allergy status to narcotic agent status: Secondary | ICD-10-CM | POA: Insufficient documentation

## 2020-10-27 DIAGNOSIS — Z8379 Family history of other diseases of the digestive system: Secondary | ICD-10-CM | POA: Insufficient documentation

## 2020-10-27 DIAGNOSIS — Z881 Allergy status to other antibiotic agents status: Secondary | ICD-10-CM | POA: Diagnosis not present

## 2020-10-27 DIAGNOSIS — J439 Emphysema, unspecified: Secondary | ICD-10-CM | POA: Diagnosis not present

## 2020-10-27 DIAGNOSIS — K219 Gastro-esophageal reflux disease without esophagitis: Secondary | ICD-10-CM | POA: Diagnosis not present

## 2020-10-27 DIAGNOSIS — Z807 Family history of other malignant neoplasms of lymphoid, hematopoietic and related tissues: Secondary | ICD-10-CM | POA: Insufficient documentation

## 2020-10-27 DIAGNOSIS — Z818 Family history of other mental and behavioral disorders: Secondary | ICD-10-CM | POA: Insufficient documentation

## 2020-10-27 DIAGNOSIS — Z888 Allergy status to other drugs, medicaments and biological substances status: Secondary | ICD-10-CM | POA: Insufficient documentation

## 2020-10-27 DIAGNOSIS — Z8673 Personal history of transient ischemic attack (TIA), and cerebral infarction without residual deficits: Secondary | ICD-10-CM | POA: Insufficient documentation

## 2020-10-27 DIAGNOSIS — Z7951 Long term (current) use of inhaled steroids: Secondary | ICD-10-CM | POA: Diagnosis not present

## 2020-10-27 DIAGNOSIS — Z9889 Other specified postprocedural states: Secondary | ICD-10-CM

## 2020-10-27 DIAGNOSIS — Z8489 Family history of other specified conditions: Secondary | ICD-10-CM | POA: Insufficient documentation

## 2020-10-27 DIAGNOSIS — Z8781 Personal history of (healed) traumatic fracture: Secondary | ICD-10-CM

## 2020-10-27 DIAGNOSIS — W19XXXA Unspecified fall, initial encounter: Secondary | ICD-10-CM | POA: Insufficient documentation

## 2020-10-27 DIAGNOSIS — Z8349 Family history of other endocrine, nutritional and metabolic diseases: Secondary | ICD-10-CM | POA: Insufficient documentation

## 2020-10-27 DIAGNOSIS — I129 Hypertensive chronic kidney disease with stage 1 through stage 4 chronic kidney disease, or unspecified chronic kidney disease: Secondary | ICD-10-CM | POA: Diagnosis not present

## 2020-10-27 DIAGNOSIS — Z91041 Radiographic dye allergy status: Secondary | ICD-10-CM | POA: Diagnosis not present

## 2020-10-27 DIAGNOSIS — N2581 Secondary hyperparathyroidism of renal origin: Secondary | ICD-10-CM | POA: Diagnosis not present

## 2020-10-27 DIAGNOSIS — Z8249 Family history of ischemic heart disease and other diseases of the circulatory system: Secondary | ICD-10-CM | POA: Insufficient documentation

## 2020-10-27 DIAGNOSIS — Z7982 Long term (current) use of aspirin: Secondary | ICD-10-CM | POA: Diagnosis not present

## 2020-10-27 DIAGNOSIS — G5601 Carpal tunnel syndrome, right upper limb: Secondary | ICD-10-CM | POA: Insufficient documentation

## 2020-10-27 DIAGNOSIS — N189 Chronic kidney disease, unspecified: Secondary | ICD-10-CM | POA: Insufficient documentation

## 2020-10-27 DIAGNOSIS — F17210 Nicotine dependence, cigarettes, uncomplicated: Secondary | ICD-10-CM | POA: Insufficient documentation

## 2020-10-27 DIAGNOSIS — Z91013 Allergy to seafood: Secondary | ICD-10-CM | POA: Insufficient documentation

## 2020-10-27 DIAGNOSIS — Z79899 Other long term (current) drug therapy: Secondary | ICD-10-CM | POA: Diagnosis not present

## 2020-10-27 DIAGNOSIS — Z823 Family history of stroke: Secondary | ICD-10-CM | POA: Diagnosis not present

## 2020-10-27 DIAGNOSIS — S52551A Other extraarticular fracture of lower end of right radius, initial encounter for closed fracture: Secondary | ICD-10-CM | POA: Diagnosis present

## 2020-10-27 HISTORY — PX: CARPAL TUNNEL RELEASE: SHX101

## 2020-10-27 HISTORY — PX: OPEN REDUCTION INTERNAL FIXATION (ORIF) DISTAL RADIAL FRACTURE: SHX5989

## 2020-10-27 SURGERY — OPEN REDUCTION INTERNAL FIXATION (ORIF) DISTAL RADIUS FRACTURE
Anesthesia: General | Laterality: Right

## 2020-10-27 MED ORDER — ONDANSETRON HCL 4 MG/2ML IJ SOLN
INTRAMUSCULAR | Status: AC
  Filled 2020-10-27: qty 2

## 2020-10-27 MED ORDER — MIDAZOLAM HCL 2 MG/2ML IJ SOLN
INTRAMUSCULAR | Status: DC | PRN
  Administered 2020-10-27 (×3): 1 mg via INTRAVENOUS

## 2020-10-27 MED ORDER — DROPERIDOL 2.5 MG/ML IJ SOLN
0.6250 mg | Freq: Once | INTRAMUSCULAR | Status: DC | PRN
Start: 2020-10-27 — End: 2020-10-27
  Filled 2020-10-27: qty 2

## 2020-10-27 MED ORDER — FENTANYL CITRATE (PF) 100 MCG/2ML IJ SOLN
INTRAMUSCULAR | Status: AC
  Administered 2020-10-27: 50 ug via INTRAVENOUS
  Filled 2020-10-27: qty 2

## 2020-10-27 MED ORDER — ORAL CARE MOUTH RINSE: 15.0000 mL | Freq: Once | OROMUCOSAL | Status: DC

## 2020-10-27 MED ORDER — HYDROCODONE-ACETAMINOPHEN 5-325 MG PO TABS: 1.0000 | ORAL_TABLET | Freq: Four times a day (QID) | ORAL | 0 refills | Status: DC | PRN

## 2020-10-27 MED ORDER — LIDOCAINE HCL (PF) 2 % IJ SOLN
INTRAMUSCULAR | Status: AC
  Filled 2020-10-27: qty 2

## 2020-10-27 MED ORDER — FENTANYL CITRATE (PF) 100 MCG/2ML IJ SOLN
INTRAMUSCULAR | Status: DC | PRN
  Administered 2020-10-27: 50 ug via INTRAVENOUS

## 2020-10-27 MED ORDER — KETOROLAC TROMETHAMINE 30 MG/ML IJ SOLN: 30.0000 mg | Freq: Once | INTRAMUSCULAR | Status: AC | PRN

## 2020-10-27 MED ORDER — PROPOFOL 10 MG/ML IV BOLUS
INTRAVENOUS | Status: AC
  Filled 2020-10-27: qty 20

## 2020-10-27 MED ORDER — PHENYLEPHRINE HCL (PRESSORS) 10 MG/ML IV SOLN
INTRAVENOUS | Status: DC | PRN
  Administered 2020-10-27 (×2): 100 ug via INTRAVENOUS

## 2020-10-27 MED ORDER — MIDAZOLAM HCL 2 MG/2ML IJ SOLN
INTRAMUSCULAR | Status: AC
  Filled 2020-10-27: qty 2

## 2020-10-27 MED ORDER — PROPOFOL 10 MG/ML IV BOLUS
INTRAVENOUS | Status: DC | PRN
  Administered 2020-10-27: 130 mg via INTRAVENOUS

## 2020-10-27 MED ORDER — CHLORHEXIDINE GLUCONATE 0.12 % MT SOLN: 15.0000 mL | Freq: Once | OROMUCOSAL | Status: DC

## 2020-10-27 MED ORDER — DEXAMETHASONE SODIUM PHOSPHATE 10 MG/ML IJ SOLN
INTRAMUSCULAR | Status: AC
  Filled 2020-10-27: qty 1

## 2020-10-27 MED ORDER — EPHEDRINE 5 MG/ML INJ
INTRAVENOUS | Status: AC
  Filled 2020-10-27: qty 10

## 2020-10-27 MED ORDER — EPHEDRINE SULFATE 50 MG/ML IJ SOLN
INTRAMUSCULAR | Status: DC | PRN
  Administered 2020-10-27: 10 mg via INTRAVENOUS
  Administered 2020-10-27: 5 mg via INTRAVENOUS
  Administered 2020-10-27: 10 mg via INTRAVENOUS

## 2020-10-27 MED ORDER — LIDOCAINE HCL (CARDIAC) PF 100 MG/5ML IV SOSY
PREFILLED_SYRINGE | INTRAVENOUS | Status: DC | PRN
  Administered 2020-10-27: 40 mg via INTRAVENOUS

## 2020-10-27 MED ORDER — BUPIVACAINE HCL 0.5 % IJ SOLN
INTRAMUSCULAR | Status: DC | PRN
  Administered 2020-10-27: 10 mL

## 2020-10-27 MED ORDER — KETOROLAC TROMETHAMINE 30 MG/ML IJ SOLN
INTRAMUSCULAR | Status: AC
  Administered 2020-10-27: 30 mg via INTRAVENOUS
  Filled 2020-10-27: qty 1

## 2020-10-27 MED ORDER — PROMETHAZINE HCL 25 MG/ML IJ SOLN
6.2500 mg | INTRAMUSCULAR | Status: DC | PRN
Start: 2020-10-27 — End: 2020-10-27

## 2020-10-27 MED ORDER — HYDROCODONE-ACETAMINOPHEN 5-325 MG PO TABS
ORAL_TABLET | ORAL | Status: AC
  Filled 2020-10-27: qty 1

## 2020-10-27 MED ORDER — HYDROCODONE-ACETAMINOPHEN 5-325 MG PO TABS
1.0000 | ORAL_TABLET | Freq: Four times a day (QID) | ORAL | Status: DC | PRN
  Administered 2020-10-27: 1 via ORAL

## 2020-10-27 MED ORDER — PHENYLEPHRINE HCL (PRESSORS) 10 MG/ML IV SOLN
INTRAVENOUS | Status: AC
  Filled 2020-10-27: qty 1

## 2020-10-27 MED ORDER — FENTANYL CITRATE (PF) 100 MCG/2ML IJ SOLN
25.0000 ug | INTRAMUSCULAR | Status: DC | PRN
  Administered 2020-10-27: 50 ug via INTRAVENOUS

## 2020-10-27 MED ORDER — CEFAZOLIN SODIUM-DEXTROSE 2-4 GM/100ML-% IV SOLN
INTRAVENOUS | Status: AC
  Filled 2020-10-27: qty 100

## 2020-10-27 MED ORDER — FENTANYL CITRATE (PF) 100 MCG/2ML IJ SOLN
INTRAMUSCULAR | Status: AC
  Filled 2020-10-27: qty 2

## 2020-10-27 MED ORDER — LACTATED RINGERS IV SOLN: INTRAVENOUS | Status: DC

## 2020-10-27 MED ORDER — HYDROMORPHONE HCL 1 MG/ML IJ SOLN
0.2500 mg | INTRAMUSCULAR | Status: DC | PRN
  Administered 2020-10-27: 0.25 mg via INTRAVENOUS

## 2020-10-27 MED ORDER — HYDROMORPHONE HCL 1 MG/ML IJ SOLN
INTRAMUSCULAR | Status: AC
  Administered 2020-10-27: 0.25 mg via INTRAVENOUS
  Filled 2020-10-27: qty 1

## 2020-10-27 MED ORDER — BUPIVACAINE HCL (PF) 0.5 % IJ SOLN
INTRAMUSCULAR | Status: AC
  Filled 2020-10-27: qty 30

## 2020-10-27 SURGICAL SUPPLY — 47 items
APL PRP STRL LF DISP 70% ISPRP (MISCELLANEOUS) ×1
BIT DRILL 2 FAST STEP (BIT) ×1 IMPLANT
BIT DRILL 2.5X4 QC (BIT) ×1 IMPLANT
BNDG ELASTIC 3X5.8 VLCR STR LF (GAUZE/BANDAGES/DRESSINGS) ×2 IMPLANT
BNDG ELASTIC 4X5.8 VLCR STR LF (GAUZE/BANDAGES/DRESSINGS) ×2 IMPLANT
CANISTER SUCT 1200ML W/VALVE (MISCELLANEOUS) ×2 IMPLANT
CHLORAPREP W/TINT 26 (MISCELLANEOUS) ×2 IMPLANT
COVER WAND RF STERILE (DRAPES) ×2 IMPLANT
CUFF TOURN SGL QUICK 18X4 (TOURNIQUET CUFF) IMPLANT
DRAPE FLUOR MINI C-ARM 54X84 (DRAPES) ×2 IMPLANT
ELECT CAUTERY NDL 2.0 MIC (NEEDLE) IMPLANT
ELECT CAUTERY NEEDLE 2.0 MIC (NEEDLE) IMPLANT
ELECT REM PT RETURN 9FT ADLT (ELECTROSURGICAL) ×2
ELECTRODE REM PT RTRN 9FT ADLT (ELECTROSURGICAL) ×1 IMPLANT
GAUZE SPONGE 4X4 12PLY STRL (GAUZE/BANDAGES/DRESSINGS) ×2 IMPLANT
GAUZE XEROFORM 1X8 LF (GAUZE/BANDAGES/DRESSINGS) ×4 IMPLANT
GLOVE SURG SYN 9.0  PF PI (GLOVE) ×1
GLOVE SURG SYN 9.0 PF PI (GLOVE) ×1 IMPLANT
GOWN SRG 2XL LVL 4 RGLN SLV (GOWNS) ×1 IMPLANT
GOWN STRL NON-REIN 2XL LVL4 (GOWNS) ×2
GOWN STRL REUS W/ TWL LRG LVL3 (GOWN DISPOSABLE) ×1 IMPLANT
GOWN STRL REUS W/TWL LRG LVL3 (GOWN DISPOSABLE) ×2
K-WIRE 1.6 (WIRE) ×2
K-WIRE FX5X1.6XNS BN SS (WIRE) ×1
KIT TURNOVER KIT A (KITS) ×2 IMPLANT
KWIRE FX5X1.6XNS BN SS (WIRE) IMPLANT
MANIFOLD NEPTUNE II (INSTRUMENTS) ×2 IMPLANT
NDL FILTER BLUNT 18X1 1/2 (NEEDLE) ×1 IMPLANT
NEEDLE FILTER BLUNT 18X 1/2SAF (NEEDLE) ×1
NEEDLE FILTER BLUNT 18X1 1/2 (NEEDLE) ×1 IMPLANT
NS IRRIG 500ML POUR BTL (IV SOLUTION) ×2 IMPLANT
PACK EXTREMITY ARMC (MISCELLANEOUS) ×2 IMPLANT
PAD CAST CTTN 4X4 STRL (SOFTGOODS) ×2 IMPLANT
PADDING CAST COTTON 4X4 STRL (SOFTGOODS) ×4
PEG SUBCHONDRAL SMOOTH 2.0X14 (Peg) ×1 IMPLANT
PEG SUBCHONDRAL SMOOTH 2.0X16 (Peg) ×1 IMPLANT
PEG SUBCHONDRAL SMOOTH 2.0X18 (Peg) ×1 IMPLANT
PEG SUBCHONDRAL SMOOTH 2.0X20 (Peg) ×3 IMPLANT
PLATE SHORT 21.6X48.9 NRRW RT (Plate) ×1 IMPLANT
SCALPEL PROTECTED #15 DISP (BLADE) ×4 IMPLANT
SCREW CORT 3.5X10 LNG (Screw) ×3 IMPLANT
SPLINT CAST 1 STEP 3X12 (MISCELLANEOUS) ×2 IMPLANT
SUT ETHILON 4-0 (SUTURE) ×2
SUT ETHILON 4-0 FS2 18XMFL BLK (SUTURE) ×1
SUT VICRYL 3-0 27IN (SUTURE) ×2 IMPLANT
SUTURE ETHLN 4-0 FS2 18XMF BLK (SUTURE) ×1 IMPLANT
SYR 3ML LL SCALE MARK (SYRINGE) ×2 IMPLANT

## 2020-10-27 NOTE — Op Note (Signed)
10/27/2020  3:02 PM  PATIENT:  Samantha Macias  60 y.o. female  PRE-OPERATIVE DIAGNOSIS:  Other closed extra-articular fracture of distal end of right radius, initial encounter S52.551A Acute carpal tunnel syndrome  POST-OPERATIVE DIAGNOSIS:  Other closed extra-articular fracture of distal end of right radius, initial encounter S52.551A Acute carpal tunnel syndrome  PROCEDURE:  Procedure(s): OPEN REDUCTION INTERNAL FIXATION (ORIF) DISTAL RADIAL FRACTURE (Right) CARPAL TUNNEL RELEASE (Right)  SURGEON: Laurene Footman, MD  ASSISTANTS: None  ANESTHESIA:   general  EBL:  Total I/O In: 700 [I.V.:600; IV Piggyback:100] Out: 5 [Blood:5]  BLOOD ADMINISTERED:none  DRAINS: none   LOCAL MEDICATIONS USED:  MARCAINE     SPECIMEN:  No Specimen  DISPOSITION OF SPECIMEN:  N/A  COUNTS:  YES  TOURNIQUET:  * Missing tourniquet times found for documented tourniquets in log: [id number] *  IMPLANTS: [employer] short narrow right plate with multiple smooth pegs and screws  DICTATION: .Dragon Dictation patient was brought to the operating room and after adequate anesthesia was obtained the right arm was prepped and draped in usual sterile fashion with a tourniquet by the upper arm.  After patient identification and timeout procedure were completed tourniquet was raised.  Fingertrap traction was applied with 8 pounds of traction off the end of the table.  Volar incision was made centered over the FCR tendon with the tendon sheath incised the tendon retracted radially to protect the radial artery and veins.  The pronator was elevated off the distal and proximal fragments and with traction near anatomic alignment was obtained with slight flexion of the wrist.  The plate was applied with a K wire holding it in position and checking position distal first technique was utilized with the distal peg holes filled using smooth pegs followed by bring the plate down to the bone which gave anatomic  reduction in AP and lateral planes.  310 mm cortical screws to use to hold the plate down and traction was removed under fluoroscopic views the fracture was stable.  Next the carpal tunnel was release was performed through approximately 2 cm incision in line with the ring metacarpal skin and subcutaneous tissue spread there was some thenar musculature overlying the transcarpal ligament was elevated small incision made and release carried out proximally and distally and so there is no compression visible on the nerve.  The wounds were then thoroughly irrigated 10 cc of half percent Sensorcaine were infiltrated the area of the carpal tunnel incision the tourniquet was let down the distal radius incision was closed using 3-0 Vicryl subcutaneously 4-0 nylon for the skin simple interrupted 4-0 nylon skin sutures for the carpal tunnel release followed by dressing of Xeroform 4 x 4 web roll volar splint and Ace wrap.  PLAN OF CARE: Discharge to home after PACU  PATIENT DISPOSITION:  PACU - hemodynamically stable.

## 2020-10-27 NOTE — H&P (Signed)
Chief Complaint  Patient presents with  . Wrist Injury  RIGHT WRIST INJURY; DOI: 10/14/2020   History of Present Illness:   Keauna Brasel is a 60 y.o. female that presents 10 days after sustaining a right distal radius fracture on 10/14/20. The patient presents unaccompanied.   Patient was initially seen in the ED on 10/14/20 after a fall at her daughter's house. X-rays at that time revealed a right distal radius fracture with dorsal angulation. Patient was advised to follow-up with orthopedics. She states that she sent a MyChart message, but no one responded. Patient was seen by Dr Ginette Pitman on 10/19/20, at which time a referral was placed to orthopedics. Patient was initially to be seen last week but states that her aide did not bring her to her appointment. She presents today with complaint of significant continued pain in the right wrist as well as some numbness and tingling, specifically over the right thumb. She has been wearing the splint from the ED as directed. She has been taking the oxycodone though she states she is allergic to codeine and has to take benadryl each time she takes the oxycodone.  She is right hand dominant. She does not work at this time and is disabled.   Of note, patient was scheduled to undergo placement of a spinal stimulator for chronic back pain by Dr Lacinda Axon prior to her fall and subsequent wrist fracture. She also states that she has a history of carpal tunnel in the right wrist and has worn braces at night in the past. She asks if it would be possible to have the ORIF of the wrist at the same time as placement of the spinal stimulator.  Past Medical, Surgical, Family, Social History, Allergies, Medications:   Past Medical History:  Past Medical History:  Diagnosis Date  . Abnormal gait  . Anemia  . Anxiety  . Bipolar disorder (CMS-HCC)  . Chest pain  . Colon polyps  . Connective tissue disease (CMS-HCC)  . DDD (degenerative disc disease)  . Dyslipidemia   . Emphysema of lung (CMS-HCC)  . Emphysema, unspecified (CMS-HCC)  . Fibromyalgia  . Fractured spine  . Fractured sternum  . History of cocaine use  . HPV in female  . Hyperparathyroidism, secondary renal (CMS-HCC)  . Hypertension  . Hypertension  . Insomnia  . Low back pain  . Lumbar radiculopathy  . Migraines  . Multinodular goiter  . Nasal ulcer  . OCD (obsessive compulsive disorder)  . Oral ulcer  . Osteoarthritis  . PTSD (post-traumatic stress disorder)  . Renal insufficiency  Cysts  . Skin ulcer (CMS-HCC)  . TIA (transient ischemic attack)  . Trochanteric bursitis of both hips   Past Surgical History:  Past Surgical History:  Procedure Laterality Date  . COLONOSCOPY 04/01/2017  Hyperplastic colon polyp/PHx Colon polyps/ 5 yr. MUS  . Deviated septum and polyp repair 1993  . EGD 04/01/2017  GERD no repeat MUS  . TOOTH EXTRACTION 1990  wisdom teeth x4  . TUBAL LIGATION 1995   Current Medications:  Current Outpatient Medications  Medication Sig Dispense Refill  . acetaminophen/diphenhydramine (TYLENOL PM ORAL) Take by mouth as needed.  Marland Kitchen albuterol 90 mcg/actuation inhaler INHALE 2 PUFFS INTO THE LUNGS EVERY 6 HOURS AS NEEDED FOR WHEEZING OR SHORTNESS OF BREATH 17 g 2  . ALPRAZolam (XANAX) 0.5 MG tablet Take 1 tablet (0.5 mg total) by mouth once daily as needed for Sleep 30 tablet 1  . ANORO ELLIPTA 62.5-25 mcg/actuation inhaler INHALE  1 INHALATION INTO THE LUNGS ONCE DAILY 60 each 5  . buPROPion (WELLBUTRIN XL) 300 MG XL tablet TAKE 1 TABLET BY MOUTH ONCE EVERY MORNING 90 tablet 1  . donepeziL (ARICEPT) 10 MG tablet TAKE 1 TABLET BY MOUTH AT BEDTIME 30 tablet 0  . doxepin (SINEQUAN) 10 MG capsule TAKE 1 CAPSULE BY MOUTH AT BEDTIME 90 capsule 1  . losartan-hydrochlorothiazide (HYZAAR) 100-12.5 mg tablet TAKE 1 TABLET BY MOUTH ONCE DAILY 90 tablet 1  . melatonin 5 mg Tab Take 5 mg by mouth as needed  . meloxicam (MOBIC) 15 MG tablet TAKE 1 TABLET BY MOUTH ONCE  DAILY WITH FOOD 30 tablet 5  . ondansetron (ZOFRAN) 4 MG tablet Take 4 mg by mouth every 12 (twelve) hours as needed  . oxyCODONE (ROXICODONE) 5 MG immediate release tablet Take 1 tablet (5 mg total) by mouth 3 (three) times daily as needed for Pain for up to 15 days 45 tablet 0  . pantoprazole (PROTONIX) 40 MG DR tablet TAKE 1 TABLET BY MOUTH TWICE DAILY BEFORE MEALS 180 tablet 1  . pregabalin (LYRICA) 200 MG capsule Take 1 capsule (200 mg total) by mouth 3 (three) times daily 90 capsule 3  . QUEtiapine (SEROQUEL) 100 MG tablet 2 tabs po qhs 180 tablet 1  . ramelteon (ROZEREM) 8 mg tablet TAKE 1 TABLET BY MOUTH AT BEDTIME 30 tablet 5  . sertraline (ZOLOFT) 50 MG tablet Take 1 tablet (50 mg total) by mouth once daily 90 tablet 1  . tiZANidine (ZANAFLEX) 4 MG tablet TAKE 1 TABLET BY MOUTH 3 TIMES DAILY AS NEEDED 90 tablet 5  . albuterol (PROVENTIL) 2.5 mg /3 mL (0.083 %) nebulizer solution Take 3 mLs (2.5 mg total) by nebulization every 4 (four) hours as needed for Wheezing (Patient not taking: Reported on 10/24/2020) 75 mL 5  . aspirin 325 MG tablet Take 650 mg by mouth nightly  . aspirin 81 MG EC tablet Take 81 mg by mouth once daily.  Marland Kitchen atorvastatin (LIPITOR) 10 MG tablet TAKE 1 TABLET BY MOUTH ONCE DAILY (Patient not taking: Reported on 10/24/2020) 90 tablet 1  . cyanocobalamin (VITAMIN B-12) 1000 MCG tablet Take 1 tablet (1,000 mcg total) by mouth once daily 90 tablet 3  . diclofenac (VOLTAREN) 1 % topical gel Apply topically 4 (four) times daily 100 g 0  . ezetimibe (ZETIA) 10 mg tablet TAKE 1 TABLET BY MOUTH ONCE DAILY (Patient not taking: Reported on 10/24/2020) 90 tablet 1  . folic acid/multivit-min/lutein (CENTRUM SILVER ORAL) Take by mouth  . formoterol (PERFOROMIST) 20 mcg/2 mL nebulizer solution Take 20 mcg by nebulization 2 (two) times daily. (Patient not taking: Reported on 10/24/2020)  . lidocaine (LMX) 4 % cream Apply topically as needed 30 g 0  . mirtazapine (REMERON) 15 MG tablet  TAKE 1 TABLET BY MOUTH AT BEDTIME (Patient not taking: Reported on 10/24/2020) 90 tablet 1  . nebulizers Misc Use. (Patient not taking: Reported on 10/24/2020)  . semaglutide (RYBELSUS) 14 mg Tab Take 1 capsule by mouth once daily (Patient not taking: Reported on 10/24/2020) 90 tablet 3  . SPIRIVA WITH HANDIHALER 18 mcg inhalation capsule INAHLE CONTENTS OF 1 CAPSULE ONCE DAILY (Patient not taking: Reported on 10/24/2020) 30 capsule 5   No current facility-administered medications for this visit.   Allergies:  Allergies  Allergen Reactions  . Codeine Hives  . Iodinated Contrast Media Anaphylaxis  . Shellfish Containing Products Anaphylaxis, Swelling, Nausea And Vomiting and Shortness Of Breath  Tongue and  membrane swelling, sick to stomach  Swollen lips Tongue and membrane swelling, sick to stomach  . Tetanus Toxoid Adsorbed Other (See Comments), Nausea And Vomiting and Rash  . Ace Inhibitors Other (See Comments) and Cough  cough cough  . Nitrofurantoin Macrocrystal Nausea  . Nitrofurantoin Monohyd/M-Cryst Nausea  . Red Dye Itching   Social History:  Social History   Socioeconomic History  . Marital status: Married  Tobacco Use  . Smoking status: Current Some Day Smoker  Packs/day: 0.10  Years: 36.00  Pack years: 3.60  Types: Cigarettes  . Smokeless tobacco: Never Used  . Tobacco comment: 2/cigs per day, trying to quit, using patch  Vaping Use  . Vaping Use: Never used  Substance and Sexual Activity  . Alcohol use: Yes  Comment: rare  . Drug use: Yes  Comment: rarely, marjaunia  . Sexual activity: Never   Family History:  Family History  Problem Relation Age of Onset  . Thyroid disease Mother  . Bipolar disorder Mother  . Myocardial Infarction (Heart attack) Father  . Alcohol abuse Father  . Hepatitis C Sister  . Liver disease Sister  . Thyroid disease Sister  . Bipolar disorder Sister  . Hodgkin's lymphoma Brother  NON  . No Known Problems Maternal Grandmother   . No Known Problems Maternal Grandfather  . Stroke Paternal Grandmother  COD  . No Known Problems Paternal Grandfather  . Obesity Sister  . Drug abuse Sister  . No Known Problems Son  . No Known Problems Daughter   Review of Systems:   A 10+ ROS was performed, reviewed, and the pertinent orthopaedic findings are documented in the HPI.   Physical Examination:   BP 120/76 (BP Location: Left upper arm, Patient Position: Sitting, BP Cuff Size: Large Adult)  Ht 167.6 cm (5\' 6" )  Wt 83.7 kg (184 lb 8 oz)  LMP (LMP Unknown)  BMI 29.78 kg/m   The patient is a well-developed, well-nourished Female in no acute distress. The patient is alert and oriented to person, place and time.  Cardiovascular: regular rate and rhythm, no m/r/g Respiratory: lungs clear to auscultation bilaterally except for scattered rhonchi The patient is able to flex and extend the right elbow.  Patient presents in the a right wrist splint. Splint left in place. Eccymosis noted over exposed proximal forearm.  Range of motion deferred due to injury.  Patient is able to make the thumbs up sign, and initiate the okay sign and criss-crossing of the 2nd and 3rd digits, though splint prevents completion of movements. Sensation is intact to pinprick over the radial, median, and ulnar nerve distributions, though decreased to pinprick over the volar aspect of the thumb. Capillary refill < 2 seconds.  Tests Performed/Reviewed:  X-rays  3 views of the right wrist were obtained. Images reveal extra-articular distal radius fracture with dorsal angulation noted. Minimally displaced ulnar styloid fracture also noted.  I personally reviewed and visualized the imaging studies if available. I additionally personally interpreted any radiographs taken during today's visit.  Impression:   ICD-10-CM  1. Other closed extra-articular fracture of distal end of right radius, initial encounter S52.551A   Plan:   Right distal radius  fracture with dorsal angulation sustained 10/14/20 Given patient is right handed, and the angulation of the fracture, patient would likely benefit from an ORIF of the right wrist. Brief discussion of post-operative course was held with patient. Patient would like to proceed with surgery with Dr Rudene Christians, if he feels surgical intervention is warranted.  Advised it was unlikely that patient would be able to undergo the placement of the spinal stimulator at the same time as the ORIF of the distal radius. Patient understands and would like to proceed anyway.  Follow-up with Rachelle Hora, PA-C post-operatively. Minette Brine our office with any questions or concerns. Follow up as indicated, or sooner should any new problems arise, if conditions worsen, or if they are otherwise concerned.   Gwenlyn Fudge, PA-C [facility] [address] [place], [place] [address] Phone: [phone]  This note was generated in part with voice recognition software and I apologize for any typographical errors that were not detected and corrected.   Electronically signed by Gwenlyn Fudge, PA at 10/24/2020 11:16 PM EDT  Patient also has carpal tunnel syndrome, carpal tunnel release added to procedure Reviewed  H+P. No changes noted.

## 2020-10-27 NOTE — Transfer of Care (Signed)
Immediate Anesthesia Transfer of Care Note  Patient: Samantha Macias  Procedure(s) Performed: OPEN REDUCTION INTERNAL FIXATION (ORIF) DISTAL RADIAL FRACTURE (Right ) CARPAL TUNNEL RELEASE (Right )  Patient Location: PACU  Anesthesia Type:General  Level of Consciousness: sedated  Airway & Oxygen Therapy: Patient Spontanous Breathing and Patient connected to face mask oxygen  Post-op Assessment: Report given to RN and Post -op Vital signs reviewed and stable  Post vital signs: stable  Last Vitals:  Vitals Value Taken Time  BP 106/70   Temp    Pulse 81 10/27/20 1501  Resp 12 10/27/20 1501  SpO2 96 % 10/27/20 1501  Vitals shown include unvalidated device data.  Last Pain:  Vitals:   10/27/20 1247  TempSrc: Temporal  PainSc: 7          Complications: No complications documented.

## 2020-10-27 NOTE — Anesthesia Preprocedure Evaluation (Addendum)
Anesthesia Evaluation  Patient identified by MRN, date of birth, ID band Patient awake    Reviewed: Allergy & Precautions, H&P , NPO status , reviewed documented beta blocker date and time   Airway Mallampati: II  TM Distance: >3 FB Neck ROM: full    Dental  (+) Poor Dentition, Chipped, Missing   Pulmonary COPD, Current Smoker and Patient abstained from smoking.,    Pulmonary exam normal        Cardiovascular hypertension, + CAD  Normal cardiovascular exam     Neuro/Psych  Headaches, PSYCHIATRIC DISORDERS Anxiety Depression Bipolar Disorder Dementia TIA Neuromuscular disease    GI/Hepatic GERD  Medicated and Controlled,  Endo/Other    Renal/GU Renal disease     Musculoskeletal  (+) Arthritis , Fibromyalgia -  Abdominal   Peds  Hematology  (+) Blood dyscrasia, anemia ,   Anesthesia Other Findings Past Medical History: No date: Abnormal gait No date: Allergy No date: Anxiety No date: Arthritis No date: Back pain No date: Bipolar disorder (HCC) No date: CAD (coronary artery disease) No date: Chronic kidney disease No date: Collagen vascular disease (HCC) No date: Colon polyp No date: COPD (chronic obstructive pulmonary disease) (HCC) No date: Falls No date: Fibromyalgia No date: GERD (gastroesophageal reflux disease)     Comment:  erosive gastritis per EGD 07/2017 No date: Hyperlipidemia No date: Hypertension No date: IBS (irritable bowel syndrome) No date: Kidney disease No date: Migraine No date: Numbness and tingling     Comment:  hand and legs No date: Renal insufficiency No date: Spinal stenosis 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 07/2017: Thyroid nodule     Comment:  several nodules being followed by Dr. Gabriel Carina No date: TIA (transient ischemic attack) Past Surgical History: 04/01/2017: COLONOSCOPY WITH PROPOFOL; N/A     Comment:  Procedure: COLONOSCOPY WITH PROPOFOL;  Surgeon:               Lollie Sails, MD;  [facility] ENDOSCOPY;                Service: Endoscopy;  Laterality: N/A; 2019: DENTAL SURGERY 04/01/2017: ESOPHAGOGASTRODUODENOSCOPY (EGD) WITH PROPOFOL; N/A     Comment:  Procedure: ESOPHAGOGASTRODUODENOSCOPY (EGD) WITH               PROPOFOL;  Surgeon: Lollie Sails, MD;  Location:               [facility] ENDOSCOPY;  Service: Endoscopy;  Laterality: N/A; No date: MOUTH SURGERY No date: NASAL SINUS SURGERY No date: TUBAL LIGATION BMI    Body Mass Index: 30.02 kg/m     Reproductive/Obstetrics                            Anesthesia Physical Anesthesia Plan  ASA: III  Anesthesia Plan: General, General ETT and General LMA   Post-op Pain Management:    Induction: Intravenous  PONV Risk Score and Plan: 2 and Ondansetron, Dexamethasone, Midazolam and Treatment may vary due to age or medical condition  Airway Management Planned: LMA and Oral ETT  Additional Equipment:   Intra-op Plan:   Post-operative Plan: Extubation in OR  Informed Consent: I have reviewed the patients History and Physical, chart, labs and discussed the procedure including the risks, benefits and alternatives for the proposed anesthesia with the patient or authorized representative who has indicated his/her understanding and acceptance.     Dental Advisory Given  Plan Discussed with: CRNA  Anesthesia Plan  Comments: (LMA vs ETT as required by surgery)       Anesthesia Quick Evaluation

## 2020-10-27 NOTE — Anesthesia Procedure Notes (Signed)
Procedure Name: LMA Insertion Date/Time: 10/27/2020 2:02 PM Performed by: Aline Brochure, CRNA Pre-anesthesia Checklist: Patient identified, Patient being monitored, Timeout performed, Emergency Drugs available and Suction available Patient Re-evaluated:Patient Re-evaluated prior to induction Oxygen Delivery Method: Circle system utilized Preoxygenation: Pre-oxygenation with 100% oxygen Induction Type: IV induction Ventilation: Mask ventilation without difficulty LMA: LMA inserted LMA Size: 3.5 Tube type: Oral Number of attempts: 1 Placement Confirmation: positive ETCO2 and breath sounds checked- equal and bilateral Tube secured with: Tape Dental Injury: Teeth and Oropharynx as per pre-operative assessment

## 2020-10-27 NOTE — Discharge Instructions (Addendum)
Keep arm elevated is much as you can through the weekend. Ice to the back a hand and wrist through the weekend to help with swelling and pain Work on finger motion is much as you can Keep splint and dressing clean and dry Pain medicine as directed  AMBULATORY SURGERY  DISCHARGE INSTRUCTIONS   1) The drugs that you were given will stay in your system until tomorrow so for the next 24 hours you should not:  A) Drive an automobile B) Make any legal decisions C) Drink any alcoholic beverage   2) You may resume regular meals tomorrow.  Today it is better to start with liquids and gradually work up to solid foods.  You may eat anything you prefer, but it is better to start with liquids, then soup and crackers, and gradually work up to solid foods.   3) Please notify your doctor immediately if you have any unusual bleeding, trouble breathing, redness and pain at the surgery site, drainage, fever, or pain not relieved by medication.    4) Additional Instructions:    Please contact your physician with any problems or Same Day Surgery at [phone], Monday through Friday 6 am to 4 pm, or [place] at [facility] number at [phone].

## 2020-10-27 NOTE — Progress Notes (Signed)
Patient with chronic pain, states pain level "always"  7/10. While in pacu, received 141mcq fentanyl IV, 30mg  toradol IV and .5 Dilaudid IV as ordered.  Upon transfer to post-op pain level down to 5-7/10 Awake/alert x4.

## 2020-10-28 ENCOUNTER — Encounter: Payer: Self-pay | Admitting: Orthopedic Surgery

## 2020-10-28 ENCOUNTER — Other Ambulatory Visit: Payer: Self-pay | Admitting: Pain Medicine

## 2020-10-28 DIAGNOSIS — M792 Neuralgia and neuritis, unspecified: Secondary | ICD-10-CM

## 2020-10-28 DIAGNOSIS — M797 Fibromyalgia: Secondary | ICD-10-CM

## 2020-11-01 ENCOUNTER — Telehealth: Payer: Self-pay | Admitting: Pain Medicine

## 2020-11-01 NOTE — Telephone Encounter (Signed)
Pharmacy notified that all non narcotic scripts have been sent to the PCP.  Pharmacy states they will reach out to the PCP.

## 2020-11-02 DIAGNOSIS — F0390 Unspecified dementia without behavioral disturbance: Secondary | ICD-10-CM

## 2020-11-02 HISTORY — DX: Unspecified dementia, unspecified severity, without behavioral disturbance, psychotic disturbance, mood disturbance, and anxiety: F03.90

## 2020-11-03 NOTE — Anesthesia Postprocedure Evaluation (Signed)
Anesthesia Post Note  Patient: Samantha Macias  Procedure(s) Performed: OPEN REDUCTION INTERNAL FIXATION (ORIF) DISTAL RADIAL FRACTURE (Right ) CARPAL TUNNEL RELEASE (Right )  Patient location during evaluation: PACU Anesthesia Type: General Level of consciousness: awake and alert Pain management: pain level controlled Vital Signs Assessment: post-procedure vital signs reviewed and stable Respiratory status: spontaneous breathing, nonlabored ventilation and respiratory function stable Cardiovascular status: blood pressure returned to baseline and stable Postop Assessment: no apparent nausea or vomiting Anesthetic complications: no   No complications documented.   Last Vitals:  Vitals:   10/27/20 1607 10/27/20 1640  BP: 136/88 131/85  Pulse: 82 77  Resp: 16 16  Temp: (!) 36.1 C   SpO2: 95% 96%    Last Pain:  Vitals:   10/27/20 1640  TempSrc:   PainSc: 5                  Wake Conlee Harvie Heck

## 2020-11-04 ENCOUNTER — Other Ambulatory Visit: Payer: Self-pay | Admitting: Neurosurgery

## 2020-11-08 ENCOUNTER — Other Ambulatory Visit: Payer: Self-pay

## 2020-11-08 ENCOUNTER — Ambulatory Visit
Admission: RE | Admit: 2020-11-08 | Discharge: 2020-11-08 | Disposition: A | Discharge status: Home / Self Care | Payer: Medicare Other | Source: Ambulatory Visit | Attending: Pain Medicine | Admitting: Pain Medicine

## 2020-11-08 ENCOUNTER — Ambulatory Visit (HOSPITAL_BASED_OUTPATIENT_CLINIC_OR_DEPARTMENT_OTHER): Payer: Medicare Other | Admitting: Pain Medicine

## 2020-11-08 ENCOUNTER — Encounter: Payer: Self-pay | Admitting: Pain Medicine

## 2020-11-08 VITALS — BP 123/82 | HR 72 | Temp 97.2°F | Resp 14 | Ht 68.0 in | Wt 180.0 lb

## 2020-11-08 DIAGNOSIS — M4802 Spinal stenosis, cervical region: Secondary | ICD-10-CM | POA: Insufficient documentation

## 2020-11-08 DIAGNOSIS — M5030 Other cervical disc degeneration, unspecified cervical region: Secondary | ICD-10-CM | POA: Diagnosis present

## 2020-11-08 DIAGNOSIS — M4712 Other spondylosis with myelopathy, cervical region: Secondary | ICD-10-CM

## 2020-11-08 DIAGNOSIS — G959 Disease of spinal cord, unspecified: Secondary | ICD-10-CM | POA: Insufficient documentation

## 2020-11-08 DIAGNOSIS — M47812 Spondylosis without myelopathy or radiculopathy, cervical region: Secondary | ICD-10-CM | POA: Diagnosis present

## 2020-11-08 DIAGNOSIS — M542 Cervicalgia: Secondary | ICD-10-CM | POA: Insufficient documentation

## 2020-11-08 DIAGNOSIS — Z87892 Personal history of anaphylaxis: Secondary | ICD-10-CM | POA: Diagnosis present

## 2020-11-08 DIAGNOSIS — Z91041 Radiographic dye allergy status: Secondary | ICD-10-CM

## 2020-11-08 DIAGNOSIS — Z91013 Allergy to seafood: Secondary | ICD-10-CM | POA: Insufficient documentation

## 2020-11-08 MED ORDER — SODIUM CHLORIDE 0.9% FLUSH
1.0000 mL | Freq: Once | INTRAVENOUS | Status: AC
  Administered 2020-11-08: 1 mL

## 2020-11-08 MED ORDER — MIDAZOLAM HCL 5 MG/5ML IJ SOLN
1.0000 mg | INTRAMUSCULAR | Status: DC | PRN
  Administered 2020-11-08: 1 mg via INTRAVENOUS
  Filled 2020-11-08: qty 5

## 2020-11-08 MED ORDER — DEXAMETHASONE SODIUM PHOSPHATE 10 MG/ML IJ SOLN
10.0000 mg | Freq: Once | INTRAMUSCULAR | Status: AC
  Administered 2020-11-08: 10 mg
  Filled 2020-11-08: qty 1

## 2020-11-08 MED ORDER — ROPIVACAINE HCL 2 MG/ML IJ SOLN
INTRAMUSCULAR | Status: AC
  Filled 2020-11-08: qty 20

## 2020-11-08 MED ORDER — ROPIVACAINE HCL 2 MG/ML IJ SOLN
1.0000 mL | Freq: Once | INTRAMUSCULAR | Status: AC
  Administered 2020-11-08: 1 mL via EPIDURAL

## 2020-11-08 MED ORDER — LIDOCAINE HCL 2 % IJ SOLN
20.0000 mL | Freq: Once | INTRAMUSCULAR | Status: AC
  Administered 2020-11-08: 400 mg
  Filled 2020-11-08: qty 40

## 2020-11-08 MED ORDER — SODIUM CHLORIDE (PF) 0.9 % IJ SOLN
INTRAMUSCULAR | Status: AC
  Filled 2020-11-08: qty 10

## 2020-11-08 MED ORDER — DIPHENHYDRAMINE HCL 50 MG/ML IJ SOLN
12.5000 mg | Freq: Once | INTRAMUSCULAR | Status: AC
  Administered 2020-11-08: 12.5 mg via INTRAVENOUS
  Filled 2020-11-08: qty 1

## 2020-11-08 MED ORDER — LACTATED RINGERS IV SOLN
1000.0000 mL | Freq: Once | INTRAVENOUS | Status: AC
  Administered 2020-11-08: 1000 mL via INTRAVENOUS

## 2020-11-08 NOTE — Progress Notes (Signed)
PROVIDER NOTE: Information contained herein reflects review and annotations entered in association with encounter. Interpretation of such information and data should be left to medically-trained personnel. Information provided to patient can be located elsewhere in the medical record under "Patient Instructions". Document created using STT-dictation technology, any transcriptional errors that may result from process are unintentional.    Patient: Samantha Macias  Service Category: Procedure  Provider: Gaspar Cola, MD  DOB: [birth year]  DOS: 11/08/2020  Location: [facility]  MRN: [id number]  Setting: Ambulatory - outpatient  Referring Provider: Tracie Harrier, MD  Type: Established Patient  Specialty: Interventional Pain Management  PCP: Tracie Harrier, MD   Primary Reason for Visit: Interventional Pain Management Treatment. CC: Neck Pain  Procedure:          Anesthesia, Analgesia, Anxiolysis:  Type: Diagnostic, Inter-Laminar, Cervical Epidural Steroid Injection  #2  Region: Posterior Cervico-thoracic Region Level: C7-T1 Laterality: Right-Sided Paramedial  Type: Moderate (Conscious) Sedation combined with Local Anesthesia Indication(s): Analgesia and Anxiety Route: Intravenous (IV) IV Access: Secured Sedation: Meaningful verbal contact was maintained at all times during the procedure  Local Anesthetic: Lidocaine 1-2%  Position: Prone with head of the table was raised to facilitate breathing.   Indications: 1. DDD (degenerative disc disease), cervical   2. Cervical central spinal stenosis   3. Cervical facet hypertrophy (Bilateral)   4. Cervical foraminal stenosis   5. Cervical myelopathy (HCC)   6. Cervical spondylosis with myelopathy   7. 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 (year old), female patient, seen today for interventional treatment. She  has a past surgical history that includes Tubal ligation; Nasal sinus surgery; Colonoscopy with propofol (N/A, 04/01/2017); Esophagogastroduodenoscopy (egd) with propofol (N/A, 04/01/2017); Mouth surgery; Dental surgery (2019); Open reduction internal fixation (orif) distal radial fracture (Right, 10/27/2020); and Carpal tunnel release (Right, 10/27/2020). Samantha Macias has a current medication list which includes the following prescription(s): albuterol, alprazolam, bupropion, diurex, cephalexin, vitamin d, cyanocobalamin, diclofenac sodium, tylenol pm extra strength, donepezil, hydrocodone-acetaminophen, losartan-hydrochlorothiazide, melatonin, meloxicam, multiple vitamins-minerals, ondansetron, oxycodone, pantoprazole, rybelsus, rybelsus, sertraline, spiriva handihaler, temazepam, tizanidine, anoro ellipta, and pregabalin, and the following Facility-Administered Medications: midazolam. Her primarily concern today is the Neck Pain  Initial Vital Signs:  Pulse/HCG Rate: 72ECG Heart Rate: 63 Temp: (!) 97.1 F (36.2 C) Resp: 16 BP: 111/83 SpO2: 99 %  BMI: Estimated body mass index is 27.37 kg/m as calculated from the following:   Height as of this encounter: 5\' 8"  (1.727 m).   Weight as of this encounter: 180 lb (81.6 kg).  Risk Assessment: Allergies: Reviewed. She is allergic to contrast media [iodinated diagnostic agents], shellfish allergy, tetanus toxoid adsorbed, ace inhibitors, codeine, nitrofurantoin monohyd macro, and red dye.  Allergy Precautions: None required Coagulopathies: Reviewed. None identified.  Blood-thinner therapy: None at this time Active Infection(s): Reviewed. None identified. Samantha Macias is afebrile  Site Confirmation: Samantha Macias was asked to confirm the procedure and laterality before marking the site Procedure checklist: Completed Consent:  Before the procedure and under the influence of no sedative(s), amnesic(s), or anxiolytics, the patient was informed of the treatment options, risks and possible complications. To fulfill our ethical and legal obligations, as recommended by the American Medical Association's Code of Ethics, I have informed  the patient of my clinical impression; the nature and purpose of the treatment or procedure; the risks, benefits, and possible complications of the intervention; the alternatives, including doing nothing; the risk(s) and benefit(s) of the alternative treatment(s) or procedure(s); and the risk(s) and benefit(s) of doing nothing. The patient was provided information about the general risks and possible complications associated with the procedure. These may include, but are not limited to: failure to achieve desired goals, infection, bleeding, organ or nerve damage, allergic reactions, paralysis, and death. In addition, the patient was informed of those risks and complications associated to Spine-related procedures, such as failure to decrease pain; infection (i.e.: Meningitis, epidural or intraspinal abscess); bleeding (i.e.: epidural hematoma, subarachnoid hemorrhage, or any other type of intraspinal or peri-dural bleeding); organ or nerve damage (i.e.: Any type of peripheral nerve, nerve root, or spinal cord injury) with subsequent damage to sensory, motor, and/or autonomic systems, resulting in permanent pain, numbness, and/or weakness of one or several areas of the body; allergic reactions; (i.e.: anaphylactic reaction); and/or death. Furthermore, the patient was informed of those risks and complications associated with the medications. These include, but are not limited to: allergic reactions (i.e.: anaphylactic or anaphylactoid reaction(s)); adrenal axis suppression; blood sugar elevation that in diabetics may result in ketoacidosis or comma; water retention that in patients with history of congestive heart  failure may result in shortness of breath, pulmonary edema, and decompensation with resultant heart failure; weight gain; swelling or edema; medication-induced neural toxicity; particulate matter embolism and blood vessel occlusion with resultant organ, and/or nervous system infarction; and/or aseptic necrosis of one or more joints. Finally, the patient was informed that Medicine is not an exact science; therefore, there is also the possibility of unforeseen or unpredictable risks and/or possible complications that may result in a catastrophic outcome. The patient indicated having understood very clearly. We have given the patient no guarantees and we have made no promises. Enough time was given to the patient to ask questions, all of which were answered to the patient's satisfaction. Ms. Wirick has indicated that she wanted to continue with the procedure. Attestation: I, the ordering provider, attest that I have discussed with the patient the benefits, risks, side-effects, alternatives, likelihood of achieving goals, and potential problems during recovery for the procedure that I have provided informed consent. Date  Time: 11/08/2020  9:32 AM  Pre-Procedure Preparation:  Monitoring: As per clinic protocol. Respiration, ETCO2, SpO2, BP, heart rate and rhythm monitor placed and checked for adequate function Safety Precautions: Patient was assessed for positional comfort and pressure points before starting the procedure. Time-out: I initiated and conducted the "Time-out" before starting the procedure, as per protocol. The patient was asked to participate by confirming the accuracy of the "Time Out" information. Verification of the correct person, site, and procedure were performed and confirmed by me, the nursing staff, and the patient. "Time-out" conducted as per Joint Commission's Universal Protocol (UP.01.01.01). Time: 1008  Description of Procedure:          Target Area: For Epidural Steroid injections  the target is the interlaminar space, initially targeting the lower border of the superior vertebral body lamina. Approach: Paramedial approach. Area Prepped: Entire PosteriorCervical Region DuraPrep (Iodine Povacrylex [0.7% available iodine] and Isopropyl Alcohol, 74% w/w) Safety Precautions: Aspiration looking for blood return was conducted prior to all injections. At no point did we inject any substances, as a needle was being advanced. No attempts were made at seeking any paresthesias. Safe injection practices and needle disposal techniques  used. Medications properly checked for expiration dates. SDV (single dose vial) medications used. Description of the Procedure: Protocol guidelines were followed. The procedure needle was introduced through the skin, ipsilateral to the reported pain, and advanced to the target area. Bone was contacted and the needle walked caudad, until the lamina was cleared. The epidural space was identified using "loss-of-resistance technique" with 2-3 ml of PF-NaCl (0.9% NSS), in a 5cc LOR glass syringe. Vitals:   11/08/20 1015 11/08/20 1016 11/08/20 1026 11/08/20 1037  BP: (!) 112/92 115/84 120/81 123/82  Pulse:      Resp: 17 17 15 14   Temp:  (!) 97.1 F (36.2 C)  (!) 97.2 F (36.2 C)  SpO2: 99% 99% 99% 99%  Weight:      Height:        Start Time: 1008 hrs. End Time: 1015 hrs. Materials:  Needle(s) Type: Epidural needle Gauge: 17G Length: 3.5-in Medication(s): Please see orders for medications and dosing details.  Imaging Guidance (Spinal):          Type of Imaging Technique: Fluoroscopy Guidance (Spinal) Indication(s): Assistance in needle guidance and placement for procedures requiring needle placement in or near specific anatomical locations not easily accessible without such assistance. Exposure Time: Please see nurses notes. Contrast: None used. Fluoroscopic Guidance: I was personally present during the use of fluoroscopy. "Tunnel Vision Technique"  used to obtain the best possible view of the target area. Parallax error corrected before commencing the procedure. "Direction-depth-direction" technique used to introduce the needle under continuous pulsed fluoroscopy. Once target was reached, antero-posterior, oblique, and lateral fluoroscopic projection used confirm needle placement in all planes. Images permanently stored in EMR. Interpretation: No contrast injected. I personally interpreted the imaging intraoperatively. Adequate needle placement confirmed in multiple planes. Permanent images saved into the patient's record.  Antibiotic Prophylaxis:   Anti-infectives (From admission, onward)   None     Indication(s): None identified  Post-operative Assessment:  Post-procedure Vital Signs:  Pulse/HCG Rate: 7270 Temp: (!) 97.2 F (36.2 C) Resp: 14 BP: 123/82 SpO2: 99 %  EBL: None  Complications: No immediate post-treatment complications observed by team, or reported by patient.  Note: The patient tolerated the entire procedure well. A repeat set of vitals were taken after the procedure and the patient was kept under observation following institutional policy, for this type of procedure. Post-procedural neurological assessment was performed, showing return to baseline, prior to discharge. The patient was provided with post-procedure discharge instructions, including a section on how to identify potential problems. Should any problems arise concerning this procedure, the patient was given instructions to immediately contact us, at any time, without hesitation. In any case, we plan to contact the patient by telephone for a follow-up status report regarding this interventional procedure.  Comments:  No additional relevant information.  Plan of Care  Orders:  Orders Placed This Encounter  Procedures  . Cervical Epidural Injection    Procedure: Cervical Epidural Steroid Injection/Block Purpose: Therapeutic Indication(s): Radiculitis  and cervicalgia associater with cervical degenerative disc disease.    Scheduling Instructions:     Level(s): C7-T1     Laterality: Right-sided     Sedation: Patient's choice.     Timeframe: Today    Order Specific Question:   Where will this procedure be performed?    Answer:   [facility]    Comments:   by Dr. Dossie Arbour  . [facility] C-ARM 1-60 MIN NO REPORT    Intraoperative interpretation by procedural physician at [facility].    Standing Status:   Standing    Number of Occurrences:   1    Order Specific Question:   Reason for exam:    Answer:   Assistance in needle guidance and placement for procedures requiring needle placement in or near specific anatomical locations not easily accessible without such assistance.  . Informed Consent Details: Physician/Practitioner Attestation; Transcribe to consent form and obtain patient signature    Nursing Order: Transcribe to consent form and obtain patient signature. Note: Always confirm laterality of pain with [name], before procedure.    Order Specific Question:   Physician/Practitioner attestation of informed consent for procedure/surgical case    Answer:   I, the physician/practitioner, attest that I have discussed with the patient the benefits, risks, side effects, alternatives, likelihood of achieving goals and potential problems during recovery for the procedure that I have provided informed consent.    Order Specific Question:   Procedure    Answer:   Cervical Epidural Steroid Injection (CESI) under fluoroscopic guidance    Order Specific Question:   Physician/Practitioner performing the procedure    Answer:   Nathanial Arrighi A. Dossie Arbour MD    Order Specific Question:   Indication/Reason    Answer:   Cervicalgia (Neck Pain) with or without Cervical Radiculopathy/Radiculitis (Arm/Shoulder Pain, Numbness, and/or weakness), secondary to Cervical and/or Cervicothoracic Degenerative Disc Disease (DDD), with or without  Intervertebral Disc Displacement (IVD  . Provide equipment / supplies at bedside    "Epidural Tray" (Disposable  single use) Catheter: NOT required    Standing Status:   Standing    Number of Occurrences:   1    Order Specific Question:   Specify    Answer:   Epidural Tray  . Miscellanous precautions    Standing Status:   Standing    Number of Occurrences:   1  . Miscellanous precautions    NOTE: Although It is true that patients can have allergies to shellfish and that shellfish contain iodine, most shellfish  allergies are due to two protein allergens present in the shellfish: tropomyosins and parvalbumin. Not all patients with shellfish allergies are allergic to iodine. However, as a precaution, avoid using iodine containing products.    Standing Status:   Standing    Number of Occurrences:   1   Chronic Opioid Analgesic:  None provided by our practice. NO OPIOIDS: UDS (08/07/17) (+) for Unreported Benzoylecgonine, a metabolite of cocaine; its presence    indicates use of this drug.  Source is most commonly illicit.   Medications ordered for procedure: Meds ordered this encounter  Medications  . lidocaine (XYLOCAINE) 2 % (with pres) injection 400 mg  . lactated ringers infusion 1,000 mL  . midazolam (VERSED) 5 MG/5ML injection 1-2 mg    Make sure Flumazenil is available in the pyxis when using this medication. If oversedation occurs, administer 0.2 mg IV over 15 sec. If after 45 sec no response, administer 0.2 mg again over 1 min; may repeat at 1 min intervals; not to exceed 4 doses (1 mg)  . diphenhydrAMINE (BENADRYL) injection 12.5 mg  . sodium chloride flush (NS) 0.9 % injection 1 mL  . ropivacaine (PF) 2 mg/mL (0.2%) (NAROPIN) injection 1 mL  . dexamethasone (DECADRON) injection 10 mg   Medications administered: We administered lidocaine, lactated ringers, midazolam, diphenhydrAMINE, sodium chloride flush, ropivacaine (PF) 2 mg/mL (0.2%), and dexamethasone.  See the medical  record for exact dosing, route, and time of administration.  Follow-up plan:  Return in about 2 weeks (around 11/22/2020) for procedure day (afternoon VV) (PPE).       Interventional Therapies  Risk  Complexity Considerations:   Estimated body mass index is 27.37 kg/m as calculated from the following:   Height as of this encounter: 5\' 8"  (1.727 m).   Weight as of this encounter: 180 lb (81.6 kg).  Allergy to contrast dye (ANAPHYLACTIC)  Allergy to iodine/shellfish    Planned  Pending:   Pending further evaluation   Under consideration:      Completed:   Diagnostic bilateral lumbar spinal cord stimulator trial (08/18/2020)  Therapeutic/palliative left L3, L4 lumbar sympathetic block x6 (03/29/2020) (100/25/75/85) Diagnostic bilateral cervical facet MBB x1 (03/12/2019) (90/90/25 x2 weeks/0)  Diagnostic bilateral Thoracic T7, T8, T9, & T10 Facet MBB x1 (01/08/2019) (80/100/100)  Diagnostic/therapeutic left CESI x1 (12/04/2018) (100/100/100)  Diagnostic/therapeutic right CESI x1 (04/19/2020) (70/70/0/0)  Diagnostic bilateral lumbar facet block x2 (04/14/2019) (100/100/100 x 2 days/0)  Palliative left lumbar facet RFA x1 (05/19/2019) (100/100/80/> 75)  Palliative right lumbar facet RFA x1 (06/30/2019) (100/100/80/> 75)    Therapeutic  Palliative (PRN) options:   Therapeutic/palliative left lumbar sympathetic block #7  Diagnostic bilateral cervical facet block #2  Diagnostic bilateral Thoracic T7, T8, T9, & T10 Facet MBB #2  Therapeutic left CESI #2  Therapeutic right CESI #2  Palliative bilateral lumbar facet MBB #3  Palliative left lumbar facet RFA #2  Palliative right lumbar facet RFA #1     Recent Visits Date Type Provider Dept  10/06/20 Telemedicine Milinda Pointer, MD [facility]  08/25/20 Office Visit Milinda Pointer, MD [facility]  08/18/20 Procedure visit Milinda Pointer, MD [facility]  Showing recent visits within past 90 days and  meeting all other requirements Today's Visits Date Type Provider Dept  11/08/20 Procedure visit Milinda Pointer, MD [facility]  Showing today's visits and meeting all other requirements Future Appointments Date Type Provider Dept  11/22/20 Appointment Milinda Pointer, MD [facility]  Showing future appointments within next 90 days and meeting all other requirements  Disposition: Discharge home  Discharge (Date  Time): 11/08/2020; 1038 hrs.   Primary Care Physician: Tracie Harrier, MD Location: [facility] Note by: Gaspar Cola, MD Date: 11/08/2020; Time: 11:56 AM  Disclaimer:  Medicine is not an exact science. The only guarantee in medicine is that nothing is guaranteed. It is important to note that the decision to proceed with this intervention was based on the information collected from the patient. The Data and conclusions were drawn from the patient's questionnaire, the interview, and the physical examination. Because the information was provided in large part by the patient, it cannot be guaranteed that it has not been purposely or unconsciously manipulated. Every effort has been made to obtain as much relevant data as possible for this evaluation. It is important to note that the conclusions that lead to this procedure are derived in large part from the available data. Always take into account that the treatment will also be dependent on availability of resources and existing treatment guidelines, considered by other Pain Management Practitioners as being common knowledge and practice, at the time of the intervention. For Medico-Legal purposes, it is also important to point out that variation in procedural techniques and pharmacological choices are the acceptable norm. The indications, contraindications, technique, and results of the above procedure should only be interpreted and judged by a Board-Certified Interventional Pain  Specialist with extensive familiarity and expertise in  the same exact procedure and technique.

## 2020-11-08 NOTE — Progress Notes (Signed)
Safety precautions to be maintained throughout the outpatient stay will include: orient to surroundings, keep bed in low position, maintain call bell within reach at all times, provide assistance with transfer out of bed and ambulation.  

## 2020-11-08 NOTE — Patient Instructions (Signed)

## 2020-11-09 ENCOUNTER — Telehealth: Payer: Self-pay

## 2020-11-09 NOTE — Telephone Encounter (Signed)
Post procedure phone call.  LM 

## 2020-11-11 ENCOUNTER — Telehealth: Payer: Self-pay | Admitting: Pain Medicine

## 2020-11-11 NOTE — Telephone Encounter (Signed)
Attempted to call patient.  No answer.

## 2020-11-14 NOTE — Telephone Encounter (Signed)
Called patient and she states that Dr Ginette Pitman prescribed her Oxycodone 10 mg and that was helping her pain.

## 2020-11-16 ENCOUNTER — Other Ambulatory Visit
Admission: RE | Admit: 2020-11-16 | Discharge: 2020-11-16 | Disposition: A | Discharge status: Home / Self Care | Payer: Medicare Other | Source: Ambulatory Visit | Attending: Neurosurgery | Admitting: Neurosurgery

## 2020-11-16 ENCOUNTER — Other Ambulatory Visit: Payer: Medicare Other

## 2020-11-16 NOTE — Patient Instructions (Addendum)
Your procedure is scheduled on: 11/21/20 - Monday Report to the Registration Desk on the 1st floor of the [facility]. To find out your arrival time, please call [phone] between 1PM - 3PM on: 11/18/20 - Friday 11/18/20- Report to [facility] for Labs and Bag at 9:00 am  REMEMBER: Instructions that are not followed completely may result in serious medical risk, up to and including death; or upon the discretion of your surgeon and anesthesiologist your surgery may need to be rescheduled.  Do not eat food after midnight the night before surgery.  No gum chewing, lozengers or hard candies.  You may however, drink CLEAR liquids up to 2 hours before you are scheduled to arrive for your surgery. Do not drink anything within 2 hours of your scheduled arrival time.  Clear liquids include: - water  - apple juice without pulp - gatorade (not RED, PURPLE, OR BLUE) - black coffee or tea (Do NOT add milk or creamers to the coffee or tea) Do NOT drink anything that is not on this list.   TAKE THESE MEDICATIONS THE MORNING OF SURGERY WITH A SIP OF WATER:  - atorvastatin (LIPITOR) 10 MG tablet - buPROPion (WELLBUTRIN XL) 300 MG 24 hr tablet - ezetimibe (ZETIA) 10 MG tablet - pregabalin (LYRICA) 200 MG capsule - pantoprazole (PROTONIX) 40 MG tablet, take one the night before and one on the morning of surgery - helps to prevent nausea after surgery. - tiZANidine (ZANAFLEX) 4 MG capsule - SPIRIVA HANDIHALER 18 MCG inhalation capsule - umeclidinium-vilanterol (ANORO ELLIPTA) 62.5-25 MCG/INH AEPB  Use  albuterol (PROVENTIL HFA;VENTOLIN HFA)on the day of surgery and bring to the hospital.  Follow recommendations from Cardiologist, Pulmonologist or PCP regarding stopping Aspirin, Coumadin, Plavix, Eliquis, Pradaxa, or Pletal.  One week prior to surgery: Stop taking beginning 11/16/20 - meloxicam (MOBIC) 15 MG tablet Stop Anti-inflammatories (NSAIDS) such as Advil, Aleve, Ibuprofen, Motrin,  Naproxen, Naprosyn and Aspirin based products such as Excedrin, Goodys Powder, BC Powder.  Stop ANY OVER THE COUNTER supplements until after surgery.  No Alcohol for 24 hours before or after surgery.  No Smoking including e-cigarettes for 24 hours prior to surgery.  No chewable tobacco products for at least 6 hours prior to surgery.  No nicotine patches on the day of surgery.  Do not use any "recreational" drugs for at least a week prior to your surgery.  Please be advised that the combination of cocaine and anesthesia may have negative outcomes, up to and including death. If you test positive for cocaine, your surgery will be cancelled.  On the morning of surgery brush your teeth with toothpaste and water, you may rinse your mouth with mouthwash if you wish. Do not swallow any toothpaste or mouthwash.  Do not wear jewelry, make-up, hairpins, clips or nail polish.  Do not wear lotions, powders, or perfumes.   Do not shave body from the neck down 48 hours prior to surgery just in case you cut yourself which could leave a site for infection.  Also, freshly shaved skin may become irritated if using the CHG soap.  Contact lenses, hearing aids and dentures may not be worn into surgery.  Do not bring valuables to the hospital. [facility] is not responsible for any missing/lost belongings or valuables.   Use CHG Soap or wipes as directed on instruction sheet.  Notify your doctor if there is any change in your medical condition (cold, fever, infection).  Wear comfortable clothing (specific to your surgery type)  to the hospital.  After surgery, you can help prevent lung complications by doing breathing exercises.  Take deep breaths and cough every 1-2 hours. Your doctor may order a device called an Incentive Spirometer to help you take deep breaths. When coughing or sneezing, hold a pillow firmly against your incision with both hands. This is called "splinting." Doing this helps protect  your incision. It also decreases belly discomfort.  If you are being admitted to the hospital overnight, leave your suitcase in the car. After surgery it may be brought to your room.  If you are being discharged the day of surgery, you will not be allowed to drive home. You will need a responsible adult (18 years or older) to drive you home and stay with you that night.   If you are taking public transportation, you will need to have a responsible adult (18 years or older) with you. Please confirm with your physician that it is acceptable to use public transportation.   Please call the [facility] at [phone] if you have any questions about these instructions.  Surgery Visitation Policy:  Patients undergoing a surgery or procedure may have one family member or support person with them as long as that person is not COVID-19 positive or experiencing its symptoms.  That person may remain in the waiting area during the procedure.  Inpatient Visitation:    Visiting hours are 7 a.m. to 8 p.m. Inpatients will be allowed two visitors daily. The visitors may change each day during the patient's stay. No visitors under the age of 65. Any visitor under the age of 6 must be accompanied by an adult. The visitor must pass COVID-19 screenings, use hand sanitizer when entering and exiting the patient's room and wear a mask at all times, including in the patient's room. Patients must also wear a mask when staff or their visitor are in the room. Masking is required regardless of vaccination status.

## 2020-11-17 ENCOUNTER — Encounter: Payer: Self-pay | Admitting: Neurosurgery

## 2020-11-17 NOTE — Pre-Procedure Instructions (Signed)
Made contact via phone with patient to refresh presurgical information. Reminded her to be NPO from midnight on, to shower x 2 with surgical scrub soap and what meds to take. Told her to stop Meloxicam as of now and to hold the Semaglutide and Hyzaar on day of surgery. Patient instructed that she could take Oxycodone if needed that morning (for recent surgery). Will get repeat blood work on day of surgery.

## 2020-11-18 ENCOUNTER — Other Ambulatory Visit: Admission: RE | Admit: 2020-11-18 | Payer: Medicare Other | Source: Ambulatory Visit

## 2020-11-21 ENCOUNTER — Encounter: Payer: Self-pay | Admitting: Neurosurgery

## 2020-11-21 ENCOUNTER — Ambulatory Visit: Payer: Medicare Other

## 2020-11-21 ENCOUNTER — Ambulatory Visit: Payer: Medicare Other | Admitting: Anesthesiology

## 2020-11-21 ENCOUNTER — Ambulatory Visit
Admission: RE | Admit: 2020-11-21 | Discharge: 2020-11-21 | Disposition: A | Discharge status: Home / Self Care | Payer: Medicare Other | Attending: Neurosurgery | Admitting: Neurosurgery

## 2020-11-21 ENCOUNTER — Other Ambulatory Visit: Payer: Self-pay

## 2020-11-21 ENCOUNTER — Encounter
Admission: RE | Disposition: A | Discharge status: Home / Self Care | Payer: Self-pay | Source: Home / Self Care | Attending: Neurosurgery

## 2020-11-21 DIAGNOSIS — Z79899 Other long term (current) drug therapy: Secondary | ICD-10-CM | POA: Insufficient documentation

## 2020-11-21 DIAGNOSIS — Z791 Long term (current) use of non-steroidal anti-inflammatories (NSAID): Secondary | ICD-10-CM | POA: Diagnosis not present

## 2020-11-21 DIAGNOSIS — Z881 Allergy status to other antibiotic agents status: Secondary | ICD-10-CM | POA: Diagnosis not present

## 2020-11-21 DIAGNOSIS — M545 Low back pain, unspecified: Secondary | ICD-10-CM | POA: Diagnosis not present

## 2020-11-21 DIAGNOSIS — G894 Chronic pain syndrome: Secondary | ICD-10-CM | POA: Insufficient documentation

## 2020-11-21 DIAGNOSIS — Z885 Allergy status to narcotic agent status: Secondary | ICD-10-CM | POA: Diagnosis not present

## 2020-11-21 DIAGNOSIS — F17210 Nicotine dependence, cigarettes, uncomplicated: Secondary | ICD-10-CM | POA: Diagnosis not present

## 2020-11-21 DIAGNOSIS — G90522 Complex regional pain syndrome I of left lower limb: Secondary | ICD-10-CM | POA: Diagnosis not present

## 2020-11-21 DIAGNOSIS — M79605 Pain in left leg: Secondary | ICD-10-CM | POA: Insufficient documentation

## 2020-11-21 DIAGNOSIS — Z419 Encounter for procedure for purposes other than remedying health state, unspecified: Secondary | ICD-10-CM

## 2020-11-21 HISTORY — PX: SPINAL CORD STIMULATOR INSERTION: SHX5378

## 2020-11-21 LAB — POCT I-STAT, CHEM 8
BUN: 18 mg/dL (ref 6–20)
Calcium, Ion: 1.11 mmol/L — ABNORMAL LOW (ref 1.15–1.40)
Chloride: 101 mmol/L (ref 98–111)
Creatinine, Ser: 1.1 mg/dL — ABNORMAL HIGH (ref 0.44–1.00)
Glucose, Bld: 97 mg/dL (ref 70–99)
HCT: 41 % (ref 36.0–46.0)
Hemoglobin: 13.9 g/dL (ref 12.0–15.0)
Potassium: 3.4 mmol/L — ABNORMAL LOW (ref 3.5–5.1)
Sodium: 138 mmol/L (ref 135–145)
TCO2: 24 mmol/L (ref 22–32)

## 2020-11-21 LAB — TYPE AND SCREEN
ABO/RH(D): B POS
Antibody Screen: NEGATIVE

## 2020-11-21 SURGERY — INSERTION, SPINAL CORD STIMULATOR, LUMBAR
Anesthesia: General

## 2020-11-21 MED ORDER — ONDANSETRON HCL 4 MG/2ML IJ SOLN: 4.0000 mg | Freq: Once | INTRAMUSCULAR | Status: AC | PRN

## 2020-11-21 MED ORDER — MIDAZOLAM HCL 2 MG/2ML IJ SOLN
INTRAMUSCULAR | Status: AC
  Filled 2020-11-21: qty 2

## 2020-11-21 MED ORDER — DEXAMETHASONE SODIUM PHOSPHATE 10 MG/ML IJ SOLN
INTRAMUSCULAR | Status: AC
  Filled 2020-11-21: qty 1

## 2020-11-21 MED ORDER — LIDOCAINE HCL (PF) 2 % IJ SOLN
INTRAMUSCULAR | Status: AC
  Filled 2020-11-21: qty 5

## 2020-11-21 MED ORDER — FENTANYL CITRATE (PF) 100 MCG/2ML IJ SOLN
INTRAMUSCULAR | Status: AC
  Administered 2020-11-21: 25 ug via INTRAVENOUS
  Filled 2020-11-21: qty 2

## 2020-11-21 MED ORDER — SUGAMMADEX SODIUM 200 MG/2ML IV SOLN
INTRAVENOUS | Status: DC | PRN
  Administered 2020-11-21: 150 mg via INTRAVENOUS

## 2020-11-21 MED ORDER — SUCCINYLCHOLINE CHLORIDE 200 MG/10ML IV SOSY
PREFILLED_SYRINGE | INTRAVENOUS | Status: AC
  Filled 2020-11-21: qty 10

## 2020-11-21 MED ORDER — ONDANSETRON HCL 4 MG/2ML IJ SOLN
INTRAMUSCULAR | Status: AC
  Administered 2020-11-21: 4 mg via INTRAVENOUS
  Filled 2020-11-21: qty 2

## 2020-11-21 MED ORDER — CEFAZOLIN SODIUM-DEXTROSE 2-4 GM/100ML-% IV SOLN
2.0000 g | INTRAVENOUS | Status: AC
  Administered 2020-11-21: 2 g via INTRAVENOUS

## 2020-11-21 MED ORDER — ONDANSETRON HCL 4 MG/2ML IJ SOLN
INTRAMUSCULAR | Status: AC
  Filled 2020-11-21: qty 2

## 2020-11-21 MED ORDER — HYDROMORPHONE HCL 1 MG/ML IJ SOLN
0.5000 mg | INTRAMUSCULAR | Status: AC | PRN
Start: 2020-11-21 — End: 2020-11-21
  Administered 2020-11-21 (×2): 0.5 mg via INTRAVENOUS

## 2020-11-21 MED ORDER — ROCURONIUM BROMIDE 100 MG/10ML IV SOLN
INTRAVENOUS | Status: DC | PRN
  Administered 2020-11-21: 5 mg via INTRAVENOUS

## 2020-11-21 MED ORDER — FENTANYL CITRATE (PF) 100 MCG/2ML IJ SOLN
25.0000 ug | INTRAMUSCULAR | Status: AC | PRN
  Administered 2020-11-21 (×6): 25 ug via INTRAVENOUS

## 2020-11-21 MED ORDER — VANCOMYCIN HCL 1000 MG IV SOLR
INTRAVENOUS | Status: AC
  Filled 2020-11-21: qty 1000

## 2020-11-21 MED ORDER — REMIFENTANIL HCL 1 MG IV SOLR
INTRAVENOUS | Status: AC
  Filled 2020-11-21: qty 1000

## 2020-11-21 MED ORDER — OXYCODONE HCL 5 MG PO TABS
5.0000 mg | ORAL_TABLET | Freq: Once | ORAL | Status: AC
Start: 2020-11-21 — End: 2020-11-21
  Administered 2020-11-21: 5 mg via ORAL

## 2020-11-21 MED ORDER — CHLORHEXIDINE GLUCONATE 0.12 % MT SOLN: 15.0000 mL | Freq: Once | OROMUCOSAL | Status: AC

## 2020-11-21 MED ORDER — EPHEDRINE 5 MG/ML INJ
INTRAVENOUS | Status: AC
  Filled 2020-11-21: qty 10

## 2020-11-21 MED ORDER — FENTANYL CITRATE (PF) 100 MCG/2ML IJ SOLN
INTRAMUSCULAR | Status: DC | PRN
  Administered 2020-11-21 (×2): 50 ug via INTRAVENOUS

## 2020-11-21 MED ORDER — PHENYLEPHRINE HCL (PRESSORS) 10 MG/ML IV SOLN
INTRAVENOUS | Status: DC | PRN
  Administered 2020-11-21: 200 ug via INTRAVENOUS
  Administered 2020-11-21 (×2): 100 ug via INTRAVENOUS

## 2020-11-21 MED ORDER — CEFAZOLIN SODIUM-DEXTROSE 2-4 GM/100ML-% IV SOLN
INTRAVENOUS | Status: AC
  Filled 2020-11-21: qty 100

## 2020-11-21 MED ORDER — SODIUM CHLORIDE 0.9 % IV SOLN
INTRAVENOUS | Status: DC | PRN
  Administered 2020-11-21: 20 ug/min via INTRAVENOUS

## 2020-11-21 MED ORDER — OXYCODONE HCL 5 MG PO TABS: 5.0000 mg | ORAL_TABLET | ORAL | 0 refills | Status: DC | PRN

## 2020-11-21 MED ORDER — KETAMINE HCL 50 MG/ML IJ SOLN
INTRAMUSCULAR | Status: DC | PRN
  Administered 2020-11-21: 50 mg via INTRAMUSCULAR

## 2020-11-21 MED ORDER — ACETAMINOPHEN 10 MG/ML IV SOLN
INTRAVENOUS | Status: AC
  Filled 2020-11-21: qty 100

## 2020-11-21 MED ORDER — HYDROMORPHONE HCL 1 MG/ML IJ SOLN
INTRAMUSCULAR | Status: AC
  Administered 2020-11-21: 0.5 mg via INTRAVENOUS
  Filled 2020-11-21: qty 1

## 2020-11-21 MED ORDER — ACETAMINOPHEN 10 MG/ML IV SOLN
INTRAVENOUS | Status: DC | PRN
  Administered 2020-11-21: 1000 mg via INTRAVENOUS

## 2020-11-21 MED ORDER — CHLORHEXIDINE GLUCONATE 0.12 % MT SOLN
OROMUCOSAL | Status: AC
  Administered 2020-11-21: 15 mL via OROMUCOSAL
  Filled 2020-11-21: qty 15

## 2020-11-21 MED ORDER — PROPOFOL 500 MG/50ML IV EMUL
INTRAVENOUS | Status: DC | PRN
  Administered 2020-11-21: 150 ug/kg/min via INTRAVENOUS

## 2020-11-21 MED ORDER — OXYCODONE HCL 5 MG PO TABS
ORAL_TABLET | ORAL | Status: AC
  Filled 2020-11-21: qty 1

## 2020-11-21 MED ORDER — SODIUM CHLORIDE 0.9 % IV SOLN
INTRAVENOUS | Status: DC | PRN
  Administered 2020-11-21: .2 ug/kg/min via INTRAVENOUS

## 2020-11-21 MED ORDER — PROPOFOL 10 MG/ML IV BOLUS
INTRAVENOUS | Status: DC | PRN
  Administered 2020-11-21: 160 mg via INTRAVENOUS

## 2020-11-21 MED ORDER — DIPHENHYDRAMINE HCL 50 MG/ML IJ SOLN
INTRAMUSCULAR | Status: AC
  Administered 2020-11-21: 12.5 mg via INTRAVENOUS
  Filled 2020-11-21: qty 1

## 2020-11-21 MED ORDER — ORAL CARE MOUTH RINSE: 15.0000 mL | Freq: Once | OROMUCOSAL | Status: AC

## 2020-11-21 MED ORDER — LIDOCAINE HCL (CARDIAC) PF 100 MG/5ML IV SOSY
PREFILLED_SYRINGE | INTRAVENOUS | Status: DC | PRN
  Administered 2020-11-21: 80 mg via INTRAVENOUS

## 2020-11-21 MED ORDER — SUCCINYLCHOLINE CHLORIDE 20 MG/ML IJ SOLN
INTRAMUSCULAR | Status: DC | PRN
  Administered 2020-11-21: 100 mg via INTRAVENOUS

## 2020-11-21 MED ORDER — FENTANYL CITRATE (PF) 250 MCG/5ML IJ SOLN
INTRAMUSCULAR | Status: AC
  Filled 2020-11-21: qty 5

## 2020-11-21 MED ORDER — DIPHENHYDRAMINE HCL 50 MG/ML IJ SOLN: 12.5000 mg | Freq: Once | INTRAMUSCULAR | Status: AC

## 2020-11-21 MED ORDER — DIPHENHYDRAMINE HCL 50 MG/ML IJ SOLN
12.5000 mg | Freq: Once | INTRAMUSCULAR | Status: AC
  Administered 2020-11-21: 12.5 mg via INTRAVENOUS

## 2020-11-21 MED ORDER — ROCURONIUM BROMIDE 10 MG/ML (PF) SYRINGE
PREFILLED_SYRINGE | INTRAVENOUS | Status: AC
  Filled 2020-11-21: qty 10

## 2020-11-21 MED ORDER — KETAMINE HCL 50 MG/ML IJ SOLN
INTRAMUSCULAR | Status: AC
  Filled 2020-11-21: qty 1

## 2020-11-21 MED ORDER — BUPIVACAINE-EPINEPHRINE (PF) 0.5% -1:200000 IJ SOLN
INTRAMUSCULAR | Status: DC | PRN
  Administered 2020-11-21: 10 mL

## 2020-11-21 MED ORDER — HYDROXYZINE HCL 25 MG PO TABS
25.0000 mg | ORAL_TABLET | Freq: Once | ORAL | Status: AC
  Administered 2020-11-21: 25 mg via ORAL
  Filled 2020-11-21 (×2): qty 1

## 2020-11-21 MED ORDER — VANCOMYCIN HCL 1000 MG IV SOLR
INTRAVENOUS | Status: DC | PRN
  Administered 2020-11-21: 1000 mg via TOPICAL

## 2020-11-21 MED ORDER — PROPOFOL 10 MG/ML IV BOLUS
INTRAVENOUS | Status: AC
  Filled 2020-11-21: qty 20

## 2020-11-21 MED ORDER — DEXAMETHASONE SODIUM PHOSPHATE 10 MG/ML IJ SOLN
INTRAMUSCULAR | Status: DC | PRN
  Administered 2020-11-21: 10 mg via INTRAVENOUS

## 2020-11-21 MED ORDER — EPHEDRINE SULFATE 50 MG/ML IJ SOLN
INTRAMUSCULAR | Status: DC | PRN
  Administered 2020-11-21 (×2): 10 mg via INTRAVENOUS

## 2020-11-21 MED ORDER — ONDANSETRON HCL 4 MG/2ML IJ SOLN
INTRAMUSCULAR | Status: DC | PRN
  Administered 2020-11-21: 4 mg via INTRAVENOUS

## 2020-11-21 MED ORDER — MIDAZOLAM HCL 2 MG/2ML IJ SOLN
INTRAMUSCULAR | Status: DC | PRN
  Administered 2020-11-21: 2 mg via INTRAVENOUS

## 2020-11-21 MED ORDER — LACTATED RINGERS IV SOLN: INTRAVENOUS | Status: DC

## 2020-11-21 MED ORDER — BUPIVACAINE-EPINEPHRINE (PF) 0.5% -1:200000 IJ SOLN
INTRAMUSCULAR | Status: AC
  Filled 2020-11-21: qty 30

## 2020-11-21 MED ORDER — PROPOFOL 1000 MG/100ML IV EMUL
INTRAVENOUS | Status: AC
  Filled 2020-11-21: qty 100

## 2020-11-21 SURGICAL SUPPLY — 46 items
CANISTER SUCT 1200ML W/VALVE (MISCELLANEOUS) ×4 IMPLANT
CHLORAPREP W/TINT 26 (MISCELLANEOUS) ×2 IMPLANT
CONTROLLER MEDTRONIC ×2 IMPLANT
COUNTER NEEDLE 20/40 LG (NEEDLE) ×2 IMPLANT
COVER LIGHT HANDLE STERIS (MISCELLANEOUS) ×4 IMPLANT
COVER WAND RF STERILE (DRAPES) ×2 IMPLANT
CUP MEDICINE 2OZ PLAST GRAD ST (MISCELLANEOUS) ×2 IMPLANT
DERMABOND ADVANCED (GAUZE/BANDAGES/DRESSINGS) ×1
DERMABOND ADVANCED .7 DNX12 (GAUZE/BANDAGES/DRESSINGS) ×1 IMPLANT
DEVICE IMPLANT NEUROSTIMULATOR (Neuro Prosthesis/Implant) ×2 IMPLANT
DRAPE C-ARM XRAY 36X54 (DRAPES) ×4 IMPLANT
DRAPE C-ARMOR (DRAPES) IMPLANT
DRAPE LAPAROTOMY 100X77 ABD (DRAPES) ×2 IMPLANT
DRAPE SURG 17X11 SM STRL (DRAPES) ×2 IMPLANT
DRSG OPSITE 4X5.5 SM (GAUZE/BANDAGES/DRESSINGS) ×4 IMPLANT
DRSG TEGADERM 4X4.75 (GAUZE/BANDAGES/DRESSINGS) ×2 IMPLANT
ELECT CAUTERY BLADE TIP 2.5 (TIP) ×2
ELECTRODE CAUTERY BLDE TIP 2.5 (TIP) ×1 IMPLANT
ENVELOPE ABSORB ANTIBACTERIAL (Mesh General) ×2 IMPLANT
FEE INTRAOP CADWELL SUPPLY NCS (MISCELLANEOUS) ×1 IMPLANT
FEE INTRAOP MONITOR IMPULS NCS (MISCELLANEOUS) ×1 IMPLANT
GAUZE SPONGE 4X4 12PLY STRL (GAUZE/BANDAGES/DRESSINGS) IMPLANT
GLOVE SRG 8 PF TXTR STRL LF DI (GLOVE) ×1 IMPLANT
GLOVE SURG SYN 7.0 (GLOVE) ×4 IMPLANT
GLOVE SURG SYN 8.0 (GLOVE) ×2 IMPLANT
GLOVE SURG UNDER POLY LF SZ7 (GLOVE) ×2 IMPLANT
GLOVE SURG UNDER POLY LF SZ8 (GLOVE) ×1
GRADUATE 1200CC STRL 31836 (MISCELLANEOUS) ×2 IMPLANT
INTRAOP CADWELL SUPPLY FEE NCS (MISCELLANEOUS) ×1
INTRAOP DISP SUPPLY FEE NCS (MISCELLANEOUS) ×1
INTRAOP MONITOR FEE IMPULS NCS (MISCELLANEOUS) ×1
INTRAOP MONITOR FEE IMPULSE (MISCELLANEOUS) ×1
KIT LEAD (Lead) ×4 IMPLANT
KIT TURNOVER KIT A (KITS) ×2 IMPLANT
MANIFOLD NEPTUNE II (INSTRUMENTS) ×2 IMPLANT
MARKER SKIN DUAL TIP RULER LAB (MISCELLANEOUS) ×2 IMPLANT
NS IRRIG 1000ML POUR BTL (IV SOLUTION) ×2 IMPLANT
PACK LAMINECTOMY NEURO (CUSTOM PROCEDURE TRAY) ×2 IMPLANT
PAD ARMBOARD 7.5X6 YLW CONV (MISCELLANEOUS) ×2 IMPLANT
RECHARGER INTELLIS (NEUROSURGERY SUPPLIES) ×2 IMPLANT
REPROGRAMMER INTELLIS (NEUROSURGERY SUPPLIES) ×2 IMPLANT
SUT ETHILON 3-0 FS-10 30 BLK (SUTURE) ×4
SUT POLYSORB 2-0 5X18 GS-10 (SUTURE) ×10 IMPLANT
SUT SILK 2 0 SH (SUTURE) ×4 IMPLANT
SUTURE EHLN 3-0 FS-10 30 BLK (SUTURE) ×2 IMPLANT
TOWEL OR 17X26 4PK STRL BLUE (TOWEL DISPOSABLE) ×4 IMPLANT

## 2020-11-21 NOTE — Interval H&P Note (Signed)
History and Physical Interval Note:  11/21/2020 9:23 AM  Samantha Macias  has presented today for surgery, with the diagnosis of Complex regional pain syndrome type 1 of left lower extremity G90.522.  The various methods of treatment have been discussed with the patient and family. After consideration of risks, benefits and other options for treatment, the patient has consented to  Procedure(s) with comments: THORACIC PERCUTANEOUS SPINAL CORD STIMULATOR LEADS AND FLANK PULSE GENERATOR INSERTION (N/A) - schedule as 2nd case as a surgical intervention.  The patient's history has been reviewed, patient examined, no change in status, stable for surgery.  I have reviewed the patient's chart and labs.  Questions were answered to the patient's satisfaction.     Deetta Perla

## 2020-11-21 NOTE — Op Note (Signed)
Operative Note   SURGERY DATE:  11/21/2020   PRE-OP DIAGNOSIS:  Chronic pain syndrome   POST-OP DIAGNOSIS: Post-Op Diagnosis Codes: Chronic pain syndrome   Procedure(s) with comments: Percutaneous thoracic spinal cord stimulator lead placement Right flank pulse generator placement   SURGEON:    * Malen Gauze, MD   Liliane Bade, PA - Asst   ANESTHESIA: General    OPERATIVE FINDINGS: Successful placement of thoracic spinal cord stimulator and pulse generator   Indication Ms. Millspaugh had a telephone visit on 5/3 after having undergone a spinal cord stimulator trial and did well with greater than 75% relief of pain in her back and left side. The patient wished to proceed to permanent implant to decrease medication use and achieve better pain control. Risks including damage to spinal cord, weakness, hematoma, infection, failure of pain relief, post-operative pain, need for revision, stroke, heart attack, pneumonia, and spinal cord injury were discussed.     Procedure The patient was brought to the operating room where vascular access was obtained and she was intubated by the anesthesia service.  Neuro monitoring electrodes were placed and baseline MEP's and SSEPs were normal.  She was placed prone on gel rolls. Antibiotics were given. Fluoroscopy was used to confirm planned incision in lumbar area at the level of L2-3 in the midline.  An additional incision was planned in the right flank for the pulse generator placement.  The patient was prepped and draped in a sterile fashion. A hard time out was performed. Local anesthetic was instilled into planned incision sites.   The midline lumbar incision was opened and taken to the fascia using cautery.Next, a Touhy needle was used to insert in a paramedian approach to enter the interlaminar space between L1 and L2.  Once loss of resistance was identified, this was confirmed with a metal stylette.  Next, the percutaneous lead was passed in the  rostral direction using fluoroscopy as guidance to keep in the midline.  This was passed without resistance to the level of the T8 vertebral body on the left.  Lateral views were obtained to confirm we were in the dorsal epidural space.  Next, the same procedure was performed contralaterally. We placed an additional lead on the right at T8, slightly offset and inferior to the previous lead to allow for better coverage. The leads were secured with anchors in the fascia.  The incision was irrigated and hemostasis obtained.    Next, the right flank incision was opened and taken to the fascia and inferior dissection to allow for a large enough pocket for the placement of the battery.  The incision was irrigated and hemostasis obtained.  Next, a tunneler was used to pass the electrode leads from the lumbar incision to the right flank.  There, the electrodes were attached to the pulse generator and impedances were found to be normal. The pulse generator was placed into the incision taking care to place the wires beneath the pulse generator.    A final fluoroscopic image was taken show good placement of percutaneous leads.  MEP's and SSEPS remained stable throughout the case. The battery and wires were secured to fascia with suture. Vancomycin powder was placed into the incisions.  Next, a combination of 2-0  Vicryls were used to close the incisions with  Dermabond on the skin in flank and dermabond in the lumbar midline.   Sterile dressings were applied. The patient was returned to supine position and the patient was seen to be moving  all extremities symmetrically and was taken to PACU for recovery. The family was updated and all questions answered.      ESTIMATED BLOOD LOSS:   20 cc   SPECIMENS None   IMPLANT KIT LEAD - [id number]  Inventory Item: KIT LEAD Serial no.:  Model/Cat no.: [id number]  Implant name: KIT LEAD - [id number] Laterality: N/A Area: Spine Thoracic  Manufacturer: [employer] Date  of Manufacture:    Action: Implanted Number Used: 1   Device Identifier:  Device Identifier Type:     KIT LEAD - [id number]  Inventory Item: KIT LEAD Serial no.:  Model/Cat no.: [id number]  Implant name: KIT LEAD - [id number] Laterality: N/A Area: Spine Thoracic  Manufacturer: [employer] Date of Manufacture:    Action: Implanted Number Used: 1   Device Identifier:  Device Identifier Type:     DEVICE Lucillie Garfinkel [id number]  Inventory Item: DEVICE IMPLANT NEUROSTIMINATOR Serial no.: [id number] Model/Cat no.: [id number]  Implant name: DEVICE Lucillie Garfinkel [id number] Laterality: N/A Area: Spine Thoracic  Manufacturer: [employer] NEUROMOD PAIN MGMT Date of Manufacture:    Action: Implanted Number Used: 1   Device Identifier:  Device Identifier Type:     ENVELOPE ABSORB ANTIBACTERIAL - [id number]  Inventory Item: ENVELOPE ABSORB ANTIBACTERIAL Serial no.:  Model/Cat no.: [id number]  Implant name: ENVELOPE ABSORB ANTIBACTERIAL - [id number] Laterality: N/A Area: Spine Thoracic  Manufacturer: [employer] Date of Manufacture:    Action: Implanted Number Used: 1   Device Identifier:  Device Identifier Type:       I performed the case in its entirety with assistance of Liliane Bade, [place]   Deetta Perla, [place]

## 2020-11-21 NOTE — Anesthesia Postprocedure Evaluation (Signed)
Anesthesia Post Note  Patient: Samantha Macias  Procedure(s) Performed: THORACIC PERCUTANEOUS SPINAL CORD STIMULATOR LEADS AND FLANK PULSE GENERATOR INSERTION  Patient location during evaluation: PACU Anesthesia Type: General Level of consciousness: awake and alert Pain management: pain level controlled Vital Signs Assessment: post-procedure vital signs reviewed and stable Respiratory status: spontaneous breathing and respiratory function stable Cardiovascular status: stable Anesthetic complications: no   No notable events documented.   Last Vitals:  Vitals:   11/21/20 0823 11/21/20 1201  BP: 113/82 129/76  Pulse: 83 79  Resp: 16 17  Temp: (!) 36.4 C   SpO2: 96% 100%    Last Pain:  Vitals:   11/21/20 0823  TempSrc: Oral  PainSc: 0-No pain                 Troyce Gieske K

## 2020-11-21 NOTE — Discharge Instructions (Addendum)
NEUROSURGERY DISCHARGE INSTRUCTIONS  Admission diagnosis: Complex regional pain syndrome type 1 of left lower extremity G90.522  Operative procedure: Thoracic SCS  What to do after you leave the hospital:  Recommended diet: regular diet. Increase protein intake to promote wound healing.  Recommended activity: No strenuous activity, bending, lifting, rotating at wast. No driving for 4 weeks.You should walk multiple times per day  Special Instructions  No straining, no heavy lifting > 10lbs x 4 weeks.  Keep incision area clean and dry. May shower in 2 days. No baths or pools for 6 weeks. Please wear lumbar brace if you have one  when up walking Please remove dressing tomorrow, no need to apply a bandage afterwards  You have no sutures to remove, the skin is closed with adhesive  Please take pain medications as directed. Take a stool softener if on pain medications   Please Report any of the following: Nausea or Vomiting, Temperature is greater than 101.49F (38.1C) degrees, Dizziness, Abdominal Pain, Difficulty Breathing or Shortness of Breath, Inability to Eat, drink Fluids, or Take medications, Bleeding, swelling, or drainage from surgical incision sites, New numbness or weakness, and Bowel or bladder dysfunction to the neurosurgeon on call at [phone]  Additional Follow up appointments Please follow up with Dr Lacinda Axon in [place] clinic as scheduled in 2-3 weeks   Please see below for scheduled appointments:  Future Appointments  Date Time Provider [place]  11/22/2020  3:20 PM Milinda Pointer, MD [facility] None      AMBULATORY SURGERY  DISCHARGE INSTRUCTIONS   The drugs that you were given will stay in your system until tomorrow so for the next 24 hours you should not:  Drive an automobile Make any legal decisions Drink any alcoholic beverage   You may resume regular meals tomorrow.  Today it is better to start with liquids and gradually work up to solid  foods.  You may eat anything you prefer, but it is better to start with liquids, then soup and crackers, and gradually work up to solid foods.   Please notify your doctor immediately if you have any unusual bleeding, trouble breathing, redness and pain at the surgery site, drainage, fever, or pain not relieved by medication.    Additional Instructions:        Please contact your physician with any problems or Same Day Surgery at [phone], Monday through Friday 6 am to 4 pm, or [place] at [facility] number at [phone].

## 2020-11-21 NOTE — Anesthesia Procedure Notes (Signed)
Procedure Name: Intubation Date/Time: 11/21/2020 10:31 AM Performed by: Debe Coder, CRNA Pre-anesthesia Checklist: Patient identified, Emergency Drugs available, Suction available and Patient being monitored Patient Re-evaluated:Patient Re-evaluated prior to induction Oxygen Delivery Method: Circle system utilized Preoxygenation: Pre-oxygenation with 100% oxygen Induction Type: IV induction Ventilation: Mask ventilation without difficulty Laryngoscope Size: McGraph and 3 Grade View: Grade II Tube type: Oral Tube size: 7.0 mm Number of attempts: 1 Airway Equipment and Method: Stylet Placement Confirmation: ETT inserted through vocal cords under direct vision, positive ETCO2 and breath sounds checked- equal and bilateral Secured at: 21 cm Tube secured with: Tape Dental Injury: Teeth and Oropharynx as per pre-operative assessment

## 2020-11-21 NOTE — Consult Note (Signed)
Pharmacy Antibiotic Note  Samantha Macias is a 60 y.o. female admitted on 11/21/2020 with surgical prophylaxis.    Pharmacy has been consulted for Cefazolin dosing.  Plan: 2 g for patients <120 kg; administer within 60 minutes of surgical incision.  May repeat dose intraoperatively in 4 hours if procedure is lengthy or if there is excessive blood loss - please contact pharmacy if additional dosing is needed.    Allergies  Allergen Reactions   Contrast Media [Iodinated Diagnostic Agents] Anaphylaxis    BETADINE ON SKIN IS OKAY   Shellfish Allergy Anaphylaxis, Shortness Of Breath and Nausea And Vomiting    Swollen lips BETADINE ON SKIN IS OKAY   Tetanus Toxoid Adsorbed Nausea And Vomiting   Ace Inhibitors Cough   Codeine Hives   Nitrofurantoin Monohyd Macro Nausea Only   Red Dye Itching     Thank you for allowing pharmacy to be a part of this patient's care.  Lu Duffel, PharmD, BCPS Clinical Pharmacist 11/21/2020 8:11 AM

## 2020-11-21 NOTE — Transfer of Care (Signed)
Immediate Anesthesia Transfer of Care Note  Patient: Samantha Macias  Procedure(s) Performed: THORACIC PERCUTANEOUS SPINAL CORD STIMULATOR LEADS AND FLANK PULSE GENERATOR INSERTION  Patient Location: PACU  Anesthesia Type:General  Level of Consciousness: awake and patient cooperative  Airway & Oxygen Therapy: Patient Spontanous Breathing and Patient connected to face mask oxygen  Post-op Assessment: Report given to RN and Post -op Vital signs reviewed and stable  Post vital signs: Reviewed and stable  Last Vitals:  Vitals Value Taken Time  BP 129/76 11/21/20 1201  Temp    Pulse 79 11/21/20 1201  Resp 17 11/21/20 1201  SpO2 100 % 11/21/20 1201    Last Pain:  Vitals:   11/21/20 0823  TempSrc: Oral  PainSc: 0-No pain         Complications: No notable events documented.

## 2020-11-21 NOTE — Anesthesia Preprocedure Evaluation (Signed)
Anesthesia Evaluation  Patient identified by MRN, date of birth, ID band Patient awake    Reviewed: Allergy & Precautions, NPO status , Patient's Chart, lab work & pertinent test results  History of Anesthesia Complications Negative for: history of anesthetic complications  Airway Mallampati: III       Dental   Pulmonary COPD,  COPD inhaler, Current Smoker,           Cardiovascular hypertension, Pt. on medications (-) Past MI and (-) CHF (-) dysrhythmias (-) Valvular Problems/Murmurs     Neuro/Psych neg Seizures Anxiety Depression Bipolar Disorder Dementia TIA (Facial droop and confusion )   GI/Hepatic Neg liver ROS, GERD  Medicated and Controlled,  Endo/Other  neg diabetes  Renal/GU Renal InsufficiencyRenal disease     Musculoskeletal   Abdominal   Peds  Hematology   Anesthesia Other Findings   Reproductive/Obstetrics                             Anesthesia Physical Anesthesia Plan  ASA: 3  Anesthesia Plan: General   Post-op Pain Management:    Induction: Intravenous  PONV Risk Score and Plan: 2 and Ondansetron and Dexamethasone  Airway Management Planned: Oral ETT  Additional Equipment:   Intra-op Plan:   Post-operative Plan:   Informed Consent: I have reviewed the patients History and Physical, chart, labs and discussed the procedure including the risks, benefits and alternatives for the proposed anesthesia with the patient or authorized representative who has indicated his/her understanding and acceptance.       Plan Discussed with:   Anesthesia Plan Comments:         Anesthesia Quick Evaluation

## 2020-11-21 NOTE — H&P (Signed)
Samantha Macias is an 60 y.o. female.   Chief Complaint: Chronic back and left leg pain HPI: Samantha Macias is here for permanent thoracic SCS. She has chronic low back pain as well as left leg pain into the foot. 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 She has been seeing [facility] and undergoing multiple injections and treatments over the past few years. She recently underwent a SCS trial and got nearly 100% relief of her back pain, improving her posture and activity. After removal, the pain is back. She would like to have the permanent implant. All risks were discussed and she is ready to proceed  Past Medical History:  Diagnosis Date   Abnormal gait    Allergy    Anxiety    Arthritis    Back pain    Bipolar disorder (HCC)    CAD (coronary artery disease)    Chronic kidney disease    Collagen vascular disease (HCC)    Colon polyp    COPD (chronic obstructive pulmonary disease) ([place])    Dementia ([place]) 11/2020   some memory loss issues. aricept   Falls    Fibromyalgia    GERD (gastroesophageal reflux disease)    erosive gastritis per EGD 07/2017   Hyperlipidemia    Hypertension    IBS (irritable bowel syndrome)    Kidney disease    Migraine    Numbness and tingling    hand and legs   Renal insufficiency    Spinal stenosis    Static encephalopathy    Thyroid nodule 07/2017   several nodules being followed by Dr. Nani Skillern 2005   no residual , except some memory loss   TIA (transient ischemic attack)     Past Surgical History:  Procedure Laterality Date   CARPAL TUNNEL RELEASE Right 10/27/2020   Procedure: CARPAL TUNNEL RELEASE;  Surgeon: Hessie Knows, MD;  Location: [facility] ORS;  Service: Orthopedics;  Laterality: Right;   COLONOSCOPY WITH PROPOFOL N/A 04/01/2017   Procedure: COLONOSCOPY WITH PROPOFOL;  Surgeon: Lollie Sails, MD;  Location: [facility] ENDOSCOPY;  Service: Endoscopy;  Laterality: N/A;   DENTAL SURGERY  2019   ESOPHAGOGASTRODUODENOSCOPY (EGD) WITH  PROPOFOL N/A 04/01/2017   Procedure: ESOPHAGOGASTRODUODENOSCOPY (EGD) WITH PROPOFOL;  Surgeon: Lollie Sails, MD;  Location: [facility] ENDOSCOPY;  Service: Endoscopy;  Laterality: N/A;   MOUTH SURGERY     NASAL SINUS SURGERY     OPEN REDUCTION INTERNAL FIXATION (ORIF) DISTAL RADIAL FRACTURE Right 10/27/2020   Procedure: OPEN REDUCTION INTERNAL FIXATION (ORIF) DISTAL RADIAL FRACTURE;  Surgeon: Hessie Knows, MD;  Location: [facility] ORS;  Service: Orthopedics;  Laterality: Right;   TUBAL LIGATION      Family History  Problem Relation Age of Onset   Congestive Heart Failure Father    Alcohol abuse Father    Heart disease Father    Heart attack Father    Peripheral vascular disease Mother    Anxiety disorder Mother    Depression Mother    Thyroid disease Mother    Heart disease Mother    Mental illness Mother    Bipolar disorder Mother    Social History:  reports that she has been smoking cigarettes. She has a 6.25 pack-year smoking history. She has never used smokeless tobacco. She reports current alcohol use of about 2.0 standard drinks of alcohol per week. She reports current drug use. Drug: Marijuana.  Allergies:  Allergies  Allergen Reactions   Contrast Media [Iodinated Diagnostic Agents] Anaphylaxis  BETADINE ON SKIN IS OKAY   Shellfish Allergy Anaphylaxis, Shortness Of Breath and Nausea And Vomiting    Swollen lips BETADINE ON SKIN IS OKAY   Tetanus Toxoid Adsorbed Nausea And Vomiting   Ace Inhibitors Cough   Codeine Hives   Nitrofurantoin Monohyd Macro Nausea Only   Red Dye Itching    Medications Prior to Admission  Medication Sig Dispense Refill   albuterol (PROVENTIL HFA;VENTOLIN HFA) 108 (90 Base) MCG/ACT inhaler Inhale 2 puffs into the lungs every 6 (six) hours as needed for wheezing or shortness of breath.     ALPRAZolam (XANAX) 0.5 MG tablet Take 0.5 mg by mouth at bedtime as needed for sleep.     atorvastatin (LIPITOR) 10 MG tablet Take 10 mg by mouth daily.      buPROPion (WELLBUTRIN XL) 300 MG 24 hr tablet Take 1 tablet (300 mg total) by mouth daily. 30 tablet 1   diphenhydramine-acetaminophen (TYLENOL PM EXTRA STRENGTH) 25-500 MG TABS tablet Take 2 tablets by mouth at bedtime as needed (sleep).     donepezil (ARICEPT) 10 MG tablet Take 10 mg by mouth at bedtime.     ezetimibe (ZETIA) 10 MG tablet Take 10 mg by mouth daily.     losartan-hydrochlorothiazide (HYZAAR) 100-12.5 MG tablet Take 1 tablet by mouth daily. 90 tablet 1   Melatonin 10 MG CAPS Take 20 mg by mouth at bedtime as needed (sleep).     meloxicam (MOBIC) 15 MG tablet Take 15 mg by mouth daily.     mirtazapine (REMERON) 15 MG tablet Take 15 mg by mouth at bedtime.     ondansetron (ZOFRAN) 4 MG tablet Take 1 tablet (4 mg total) by mouth every 8 (eight) hours as needed for nausea or vomiting. 12 tablet 0   oxyCODONE (ROXICODONE) 5 MG immediate release tablet Take 1 tablet (5 mg total) by mouth every 8 (eight) hours as needed for breakthrough pain (in addition to your usual medications). (Patient taking differently: Take 5 mg by mouth every 6 (six) hours as needed for breakthrough pain (in addition to your usual medications).) 6 tablet 0   pantoprazole (PROTONIX) 40 MG tablet Take 40 mg by mouth 2 (two) times daily before a meal.  1   pregabalin (LYRICA) 200 MG capsule Take 1 capsule (200 mg total) by mouth 3 (three) times daily. 90 capsule 5   QUEtiapine (SEROQUEL) 100 MG tablet Take 200 mg by mouth at bedtime.     ramelteon (ROZEREM) 8 MG tablet Take 8 mg by mouth at bedtime.     Semaglutide (RYBELSUS) 14 MG TABS Take 14 mg by mouth daily.     sertraline (ZOLOFT) 50 MG tablet Take 1 tablet (50 mg total) by mouth daily. (Patient taking differently: Take 50 mg by mouth at bedtime.) 30 tablet 5   SPIRIVA HANDIHALER 18 MCG inhalation capsule Place 1 puff into inhaler and inhale daily.  0   tiZANidine (ZANAFLEX) 4 MG capsule Take 4 mg by mouth 3 (three) times daily.     umeclidinium-vilanterol  (ANORO ELLIPTA) 62.5-25 MCG/INH AEPB Inhale 1 puff into the lungs daily.     bismuth subsalicylate (PEPTO BISMOL) 262 MG/15ML suspension Take 30 mLs by mouth every 6 (six) hours as needed for diarrhea or loose stools. (Patient not taking: Reported on 11/21/2020)     HYDROcodone-acetaminophen (NORCO/VICODIN) 5-325 MG tablet Take 1 tablet by mouth every 6 (six) hours as needed. (Patient taking differently: Take 1 tablet by mouth every 6 (six) hours as needed  for moderate pain.) 30 tablet 0    Results for orders placed or performed during the hospital encounter of 11/21/20 (from the past 48 hour(s))  Type and screen [place]     Status: None (Preliminary result)   Collection Time: 11/21/20  8:17 AM  Result Value Ref Range   ABO/RH(D) PENDING    Antibody Screen PENDING    Sample Expiration      11/24/2020,2359 Performed at [facility], [address] [place], [place] [address]   I-STAT, [place] 8     Status: Abnormal   Collection Time: 11/21/20  8:24 AM  Result Value Ref Range   Sodium 138 135 - 145 mmol/L   Potassium 3.4 (L) 3.5 - 5.1 mmol/L   Chloride 101 98 - 111 mmol/L   BUN 18 6 - 20 mg/dL   Creatinine, Ser 1.10 (H) 0.44 - 1.00 mg/dL   Glucose, Bld 97 70 - 99 mg/dL    Comment: Glucose reference range applies only to samples taken after fasting for at least 8 hours.   Calcium, Ion 1.11 (L) 1.15 - 1.40 mmol/L   TCO2 24 22 - 32 mmol/L   Hemoglobin 13.9 12.0 - 15.0 g/dL   HCT 41.0 36.0 - 46.0 %   No results found.  Review of Systems General ROS: Negative Psychological ROS: Negative Ophthalmic ROS: Negative ENT ROS: Negative Hematological and Lymphatic ROS: Negative  Endocrine ROS: Negative Respiratory ROS: Negative Cardiovascular ROS: Negative Gastrointestinal ROS: Negative Genito-Urinary ROS: Negative Musculoskeletal ROS: Positive for back pain Neurological ROS: Positive for left leg pain Dermatological ROS: Negative  Blood pressure 113/82,  pulse 83, temperature (!) 97.5 F (36.4 C), temperature source Oral, resp. rate 16, height 5\' 8"  (1.727 m), weight 80.6 kg, SpO2 96 %. Physical Exam  Awake, alert Back: No abnormalities Legs: No edema CV: Regular rate and rhythm Pulm: Clear to auscultation   Neuro 5/5 strength in bilateral lower extremities Sensation intact to lower extremities  Imaging: CT Thoracic Spine: No large osteophytes or spinal stenosis  Assessment/Plan Proceed with thoracic SCS and pulse generator  Deetta Perla, MD 11/21/2020, 9:19 AM

## 2020-11-22 ENCOUNTER — Encounter: Payer: Self-pay | Admitting: Neurosurgery

## 2020-11-22 ENCOUNTER — Ambulatory Visit: Payer: Medicare Other | Attending: Pain Medicine | Admitting: Pain Medicine

## 2020-11-22 DIAGNOSIS — G8929 Other chronic pain: Secondary | ICD-10-CM

## 2020-11-22 DIAGNOSIS — Z9689 Presence of other specified functional implants: Secondary | ICD-10-CM

## 2020-11-22 DIAGNOSIS — M79605 Pain in left leg: Secondary | ICD-10-CM

## 2020-11-22 DIAGNOSIS — M79604 Pain in right leg: Secondary | ICD-10-CM

## 2020-11-22 DIAGNOSIS — M549 Dorsalgia, unspecified: Secondary | ICD-10-CM

## 2020-11-22 DIAGNOSIS — M5442 Lumbago with sciatica, left side: Secondary | ICD-10-CM | POA: Diagnosis not present

## 2020-11-22 DIAGNOSIS — G894 Chronic pain syndrome: Secondary | ICD-10-CM | POA: Diagnosis not present

## 2020-11-22 DIAGNOSIS — M5441 Lumbago with sciatica, right side: Secondary | ICD-10-CM

## 2020-11-22 NOTE — Progress Notes (Addendum)
Patient: Samantha Macias  Service Category: E/M  Provider: Gaspar Cola, MD  DOB: 12/07/1960  DOS: 11/22/2020  Location: Office  MRN: [id number]  Setting: Ambulatory outpatient  Referring Provider: Tracie Harrier, MD  Type: Established Patient  Specialty: Interventional Pain Management  PCP: Tracie Harrier, MD  Location: Remote location  Delivery: TeleHealth     Virtual Encounter - Pain Management PROVIDER NOTE: Information contained herein reflects review and annotations entered in association with encounter. Interpretation of such information and data should be left to medically-trained personnel. Information provided to patient can be located elsewhere in the medical record under "Patient Instructions". Document created using STT-dictation technology, any transcriptional errors that may result from process are unintentional.    Contact & Pharmacy Preferred: [phone] Home: [phone] (home) Mobile: [phone] (mobile) E-mail: [email or web address]  [place], [place] [place] [address] Phone: [phone] Fax: [phone]   Pre-screening  Samantha Macias offered "in-person" vs "virtual" encounter. She indicated preferring virtual for this encounter.   Reason COVID-19*  Social distancing based on CDC and AMA recommendations.   I contacted Samantha Macias on 11/22/2020 via telephone.      I clearly identified myself as Gaspar Cola, MD. I verified that I was speaking with the correct person using two identifiers (Name: Samantha Macias, and date of birth: [birth year]).  Consent I sought verbal advanced consent from Samantha Macias for virtual visit interactions. I informed Samantha Macias of possible security and privacy concerns, risks, and limitations associated with providing "not-in-person" medical evaluation and management services. I also informed Samantha Macias of the availability of "in-person"  appointments. Finally, I informed her that there would be a charge for the virtual visit and that she could be  personally, fully or partially, financially responsible for it. Samantha Macias expressed understanding and agreed to proceed.   Historic Elements   Samantha Macias is a 60 y.o. year old, female patient evaluated today after our last contact on 11/11/2020. Samantha Macias  has a past medical history of Abnormal gait, Allergy, Anxiety, Arthritis, Back pain, Bipolar disorder ([place]), CAD (coronary artery disease), Chronic kidney disease, Collagen vascular disease ([place]), Colon polyp, COPD (chronic obstructive pulmonary disease) ([place]), Dementia ([place]) (11/2020), Falls, Fibromyalgia, GERD (gastroesophageal reflux disease), Hyperlipidemia, Hypertension, IBS (irritable bowel syndrome), Kidney disease, Migraine, Numbness and tingling, Renal insufficiency, Spinal stenosis, Static encephalopathy, Thyroid nodule (07/2017), tia (2005), and TIA (transient ischemic attack). She also  has a past surgical history that includes Tubal ligation; Nasal sinus surgery; Colonoscopy with propofol (N/A, 04/01/2017); Esophagogastroduodenoscopy (egd) with propofol (N/A, 04/01/2017); Mouth surgery; Dental surgery (2019); Open reduction internal fixation (orif) distal radial fracture (Right, 10/27/2020); Carpal tunnel release (Right, 10/27/2020); and Spinal cord stimulator insertion (N/A, 11/21/2020). Samantha Macias has a current medication list which includes the following prescription(s): albuterol, alprazolam, atorvastatin, bismuth subsalicylate, bupropion, tylenol pm extra strength, donepezil, ezetimibe, hydrocodone-acetaminophen, losartan-hydrochlorothiazide, melatonin, meloxicam, mirtazapine, ondansetron, oxycodone, oxycodone, pantoprazole, pregabalin, quetiapine, ramelteon, rybelsus, sertraline, spiriva handihaler, tizanidine, and anoro ellipta. She  reports that she has been smoking cigarettes. She has a 6.25 pack-year smoking history.  She has never used smokeless tobacco. She reports current alcohol use of about 2.0 standard drinks of alcohol per week. She reports current drug use. Drug: Marijuana. Samantha Macias is allergic to contrast media [iodinated diagnostic agents], shellfish allergy, tetanus toxoid adsorbed, ace inhibitors, codeine, nitrofurantoin monohyd macro, and red dye.   HPI  Today, she is being contacted  for a post-procedure assessment.  The patient indicates having attained a 100% relief of the pain for the duration of the local anesthetic but she also refers that currently she is not entirely sure as to whether she is getting relief from the procedure itself or from some of the pain medications that she is currently taking.  She refers still having some sensations of pins-and-needles in her thumb, but since she broke her wrist around Friday, Oct 14, 2020 and then had her surgery just days prior to the cervical epidural steroid injection, she again is not sure if the thumb changes in sensation are related to the fracture or a radiculitis/radiculopathy.  In addition to this, she reports that she had her spinal cord stimulator implanted yesterday and she still having some pain from that.  She refers that she is having difficulty remembering some of the information that was given to her around the time of the surgery secondary to the sedatives that she received.  She does indicate having the spinal cord stimulator package with all the material that was given to her, but she has not gone into it to read it or study yet.  I have encouraged her to do so.  I have also instructed the patient to avoid any further interventional therapies until she fully heals from the broken wrist and her spinal cord stimulator surgery.  Post-Procedure Evaluation  Procedure (11/08/2020): Diagnostic/therapeutic right cervical ESI #2 under fluoroscopic guidance and IV sedation Pre-procedure pain level: 0/10 Post-procedure: 0/10 (100% relief)  Sedation:  Sedation provided.  Effectiveness during initial hour after procedure(Ultra-Short Term Relief):   100%.  Local anesthetic used: Long-acting (4-6 hours) Effectiveness: Defined as any analgesic benefit obtained secondary to the administration of local anesthetics. This carries significant diagnostic value as to the etiological location, or anatomical origin, of the pain. Duration of benefit is expected to coincide with the duration of the local anesthetic used.  Effectiveness during initial 4-6 hours after procedure(Short-Term Relief):   100%.  Long-term benefit: Defined as any relief past the pharmacologic duration of the local anesthetics.  Effectiveness past the initial 6 hours after procedure(Long-Term Relief):   80%.  Current benefits: Defined as benefit that persist at this time.   Analgesia:   The patient indicates having attained some benefit from the cervical epidural steroid injection, but she has had multiple things happen since the procedure, which have made it difficult for her to fully evaluate the results. Function: Somewhat improved ROM: Somewhat improved  Pharmacotherapy Assessment  Analgesic: None provided by our practice. NO OPIOIDS: UDS (08/07/17) (+) for Unreported Benzoylecgonine, a metabolite of cocaine; its presence    indicates use of this drug.  Source is most commonly illicit.   Monitoring: [place] PMP: PDMP reviewed during this encounter.       Pharmacotherapy: No side-effects or adverse reactions reported. Compliance: No problems identified. Effectiveness: Clinically acceptable. Plan: Refer to "POC".  UDS:  Summary  Date Value Ref Range Status  03/17/2018 FINAL  Final    Comment:    ==================================================================== [facility] COMP DRUG ANALYSIS,UR ==================================================================== Test                             Result       Flag       Units Drug Present and Declared for Prescription  Verification   Tramadol  1825         EXPECTED   ng/mg creat   O-Desmethyltramadol            929          EXPECTED   ng/mg creat   N-Desmethyltramadol            1352         EXPECTED   ng/mg creat    Source of tramadol is a prescription medication.    O-desmethyltramadol and N-desmethyltramadol are expected    metabolites of tramadol.   Pregabalin                     PRESENT      EXPECTED   Tizanidine                     PRESENT      EXPECTED   Bupropion                      PRESENT      EXPECTED   Hydroxybupropion               PRESENT      EXPECTED    Hydroxybupropion is an expected metabolite of bupropion.   Mirtazapine                    PRESENT      EXPECTED   Quetiapine                     PRESENT      EXPECTED   Salicylate                     PRESENT      EXPECTED Drug Present not Declared for Prescription Verification   Benzoylecgonine                494          UNEXPECTED ng/mg creat    Benzoylecgonine is a metabolite of cocaine; its presence    indicates use of this drug.  Source is most commonly illicit, but    cocaine is present in some topical anesthetic solutions.   Acetaminophen                  PRESENT      UNEXPECTED   Diphenhydramine                PRESENT      UNEXPECTED Drug Absent but Declared for Prescription Verification   Alprazolam                     Not Detected UNEXPECTED ng/mg creat   Sertraline                     Not Detected UNEXPECTED   Diclofenac                     Not Detected UNEXPECTED    Topical diclofenac, as indicated in the declared medication list,    is not always detected even when used as directed.   Ibuprofen                      Not Detected UNEXPECTED    Ibuprofen, as indicated in the declared medication list, is not    always detected even when used as directed. ==================================================================== Test  Result    Flag   Units      Ref Range   Creatinine               159              mg/dL      >=20 ==================================================================== Declared Medications:  The flagging and interpretation on this report are based on the  following declared medications.  Unexpected results may arise from  inaccuracies in the declared medications.  **Note: The testing scope of this panel includes these medications:  Alprazolam (Xanax)  Bupropion (Wellbutrin)  Mirtazapine (Remeron)  Pregabalin (Lyrica)  Quetiapine (Seroquel)  Sertraline (Zoloft)  Tramadol (Ultram)  **Note: The testing scope of this panel does not include small to  moderate amounts of these reported medications:  Aspirin (Aspirin 81)  Ibuprofen  Tizanidine (Zanaflex)  Topical Diclofenac  **Note: The testing scope of this panel does not include following  reported medications:  Albuterol  Donepezil (Aricept)  Formoterol (Perforomist)  Hydrochlorothiazide (Hyzaar)  Ipratropium  Losartan (Hyzaar)  Omeprazole (Prilosec)  Pantoprazole (Protonix)  Ramelteon (Rozerem)  Tiotropium (Spiriva)  Vitamin D ==================================================================== For clinical consultation, please call [phone]. ====================================================================     Laboratory Chemistry Profile   Renal Lab Results  Component Value Date   BUN 18 11/21/2020   CREATININE 1.10 (H) 11/21/2020   BCR 16 03/17/2018   GFRAA 51 (L) 03/17/2018   GFRNONAA 49 (L) 10/14/2020     Hepatic Lab Results  Component Value Date   AST 13 03/17/2018   ALT 15 12/24/2017   ALBUMIN 4.4 03/17/2018   ALKPHOS 148 (H) 03/17/2018   LIPASE 37 12/24/2017   AMMONIA 23 07/07/2015     Electrolytes Lab Results  Component Value Date   NA 138 11/21/2020   K 3.4 (L) 11/21/2020   CL 101 11/21/2020   CALCIUM 9.1 10/14/2020   MG 1.8 03/17/2018     Bone Lab Results  Component Value Date   25OHVITD1 41 03/17/2018   25OHVITD2 <1.0 03/17/2018    25OHVITD3 41 03/17/2018     Inflammation (CRP: Acute Phase) (ESR: Chronic Phase) Lab Results  Component Value Date   CRP 17 (H) 03/17/2018   ESRSEDRATE 28 03/17/2018       Note: Above Lab results reviewed.  Imaging  DG Thoracic Spine 2 View CLINICAL DATA:  Thoracic cord stimulator placement  EXAM: DG C-ARM 1-60 MIN; THORACIC SPINE 2 VIEWS  FINDINGS: Two intraoperative views of the thoracolumbar spine are submitted postoperatively for interpretation.  Thoracic cord stimulator is identified at the T8-T10 levels.  IMPRESSION: Thoracic cord stimulator placement.  Electronically Signed   By: Margarette Canada M.D.   On: 11/21/2020 13:37 DG C-Arm 1-60 Min CLINICAL DATA:  Thoracic cord stimulator placement  EXAM: DG C-ARM 1-60 MIN; THORACIC SPINE 2 VIEWS  FINDINGS: Two intraoperative views of the thoracolumbar spine are submitted postoperatively for interpretation.  Thoracic cord stimulator is identified at the T8-T10 levels.  IMPRESSION: Thoracic cord stimulator placement.  Electronically Signed   By: Margarette Canada M.D.   On: 11/21/2020 13:37  Assessment  The primary encounter diagnosis was Chronic pain syndrome. Diagnoses of Chronic low back pain (1ry area of Pain) (Bilateral) (L>R) w/ sciatica, Chronic lower extremity pain (2ry area of Pain) (Bilateral) (L>R), Chronic upper back pain (3ry area of Pain) (Bilateral) (R>L), and S/P insertion of spinal cord stimulator were also pertinent to this visit.  Plan of Care  Problem-specific:  No problem-specific Assessment & Plan notes found  for this encounter.  Samantha Macias has a current medication list which includes the following long-term medication(s): albuterol, bupropion, tylenol pm extra strength, donepezil, ezetimibe, losartan-hydrochlorothiazide, pantoprazole, pregabalin, sertraline, and spiriva handihaler.  Pharmacotherapy (Medications Ordered): No orders of the defined types were placed in this  encounter.  Orders:  No orders of the defined types were placed in this encounter.  Follow-up plan:   Return if symptoms worsen or fail to improve.     Interventional Therapies  Risk  Complexity Considerations:   Estimated body mass index is 27.37 kg/m as calculated from the following:   Height as of this encounter: $RemoveBeforeD'5\' 8"'[id number]$  (1.727 m).   Weight as of this encounter: 180 lb (81.6 kg).  Allergy to contrast dye (ANAPHYLACTIC)  Allergy to iodine/shellfish    Planned  Pending:   Pending further evaluation   Under consideration:      Completed:   Diagnostic bilateral lumbar spinal cord stimulator trial (08/18/2020)  Therapeutic/palliative left L3, L4 lumbar sympathetic block x6 (03/29/2020) (100/25/75/85) Diagnostic bilateral cervical facet MBB x1 (03/12/2019) (90/90/25 x2 weeks/0)  Diagnostic bilateral Thoracic T7, T8, T9, & T10 Facet MBB x1 (01/08/2019) (80/100/100)  Diagnostic/therapeutic left CESI x1 (12/04/2018) (100/100/100)  Diagnostic/therapeutic right CESI x1 (04/19/2020) (70/70/0/0)  Diagnostic bilateral lumbar facet block x2 (04/14/2019) (100/100/100 x 2 days/0)  Palliative left lumbar facet RFA x1 (05/19/2019) (100/100/80/> 75)  Palliative right lumbar facet RFA x1 (06/30/2019) (100/100/80/> 75)    Therapeutic  Palliative (PRN) options:   Therapeutic/palliative left lumbar sympathetic block #7  Diagnostic bilateral cervical facet block #2  Diagnostic bilateral Thoracic T7, T8, T9, & T10 Facet MBB #2  Therapeutic left CESI #2  Therapeutic right CESI #2  Palliative bilateral lumbar facet MBB #3  Palliative left lumbar facet RFA #2  Palliative right lumbar facet RFA #1      Recent Visits Date Type Provider Dept  11/22/20 Telemedicine Milinda Pointer, MD [facility]  11/08/20 Procedure visit Milinda Pointer, MD [facility]  10/06/20 Telemedicine Milinda Pointer, MD [facility]  Showing recent visits within past 90 days and meeting all  other requirements Future Appointments No visits were found meeting these conditions. Showing future appointments within next 90 days and meeting all other requirements I discussed the assessment and treatment plan with the patient. The patient was provided an opportunity to ask questions and all were answered. The patient agreed with the plan and demonstrated an understanding of the instructions.  Patient advised to call back or seek an in-person evaluation if the symptoms or condition worsens.  Duration of encounter: 25 minutes.  Note by: Gaspar Cola, MD Date: 11/22/2020; Time: 10:27 AM

## 2020-11-22 NOTE — Telephone Encounter (Signed)
Lm for patient to call office.  Notified her that we would reschedule if she does not return the call.

## 2020-12-16 ENCOUNTER — Emergency Department: Payer: Medicare Other

## 2020-12-16 ENCOUNTER — Emergency Department
Admission: EM | Admit: 2020-12-16 | Discharge: 2020-12-16 | Disposition: A | Discharge status: Home / Self Care | Payer: Medicare Other | Attending: Emergency Medicine | Admitting: Emergency Medicine

## 2020-12-16 DIAGNOSIS — F17210 Nicotine dependence, cigarettes, uncomplicated: Secondary | ICD-10-CM | POA: Insufficient documentation

## 2020-12-16 DIAGNOSIS — J449 Chronic obstructive pulmonary disease, unspecified: Secondary | ICD-10-CM | POA: Diagnosis not present

## 2020-12-16 DIAGNOSIS — Z79899 Other long term (current) drug therapy: Secondary | ICD-10-CM | POA: Insufficient documentation

## 2020-12-16 DIAGNOSIS — I2510 Atherosclerotic heart disease of native coronary artery without angina pectoris: Secondary | ICD-10-CM | POA: Diagnosis not present

## 2020-12-16 DIAGNOSIS — Z20822 Contact with and (suspected) exposure to covid-19: Secondary | ICD-10-CM | POA: Diagnosis not present

## 2020-12-16 DIAGNOSIS — N183 Chronic kidney disease, stage 3 unspecified: Secondary | ICD-10-CM | POA: Diagnosis not present

## 2020-12-16 DIAGNOSIS — F0390 Unspecified dementia without behavioral disturbance: Secondary | ICD-10-CM | POA: Insufficient documentation

## 2020-12-16 DIAGNOSIS — R55 Syncope and collapse: Secondary | ICD-10-CM | POA: Insufficient documentation

## 2020-12-16 DIAGNOSIS — I129 Hypertensive chronic kidney disease with stage 1 through stage 4 chronic kidney disease, or unspecified chronic kidney disease: Secondary | ICD-10-CM | POA: Insufficient documentation

## 2020-12-16 LAB — COMPREHENSIVE METABOLIC PANEL
ALT: 12 U/L (ref 0–44)
AST: 20 U/L (ref 15–41)
Albumin: 3.7 g/dL (ref 3.5–5.0)
Alkaline Phosphatase: 76 U/L (ref 38–126)
Anion gap: 11 (ref 5–15)
BUN: 22 mg/dL — ABNORMAL HIGH (ref 6–20)
CO2: 23 mmol/L (ref 22–32)
Calcium: 8.7 mg/dL — ABNORMAL LOW (ref 8.9–10.3)
Chloride: 107 mmol/L (ref 98–111)
Creatinine, Ser: 1.14 mg/dL — ABNORMAL HIGH (ref 0.44–1.00)
GFR, Estimated: 56 mL/min — ABNORMAL LOW (ref >60)
Glucose, Bld: 140 mg/dL — ABNORMAL HIGH (ref 70–99)
Potassium: 2.8 mmol/L — ABNORMAL LOW (ref 3.5–5.1)
Sodium: 141 mmol/L (ref 135–145)
Total Bilirubin: 0.6 mg/dL (ref 0.3–1.2)
Total Protein: 6.5 g/dL (ref 6.5–8.1)

## 2020-12-16 LAB — CBC WITH DIFFERENTIAL/PLATELET
Abs Immature Granulocytes: 0.02 10*3/uL (ref 0.00–0.07)
Basophils Absolute: 0.1 10*3/uL (ref 0.0–0.1)
Basophils Relative: 1 %
Eosinophils Absolute: 0.2 10*3/uL (ref 0.0–0.5)
Eosinophils Relative: 2 %
HCT: 37.9 % (ref 36.0–46.0)
Hemoglobin: 13.1 g/dL (ref 12.0–15.0)
Immature Granulocytes: 0 %
Lymphocytes Relative: 34 %
Lymphs Abs: 2.4 10*3/uL (ref 0.7–4.0)
MCH: 30.6 pg (ref 26.0–34.0)
MCHC: 34.6 g/dL (ref 30.0–36.0)
MCV: 88.6 fL (ref 80.0–100.0)
Monocytes Absolute: 0.6 10*3/uL (ref 0.1–1.0)
Monocytes Relative: 8 %
Neutro Abs: 3.8 10*3/uL (ref 1.7–7.7)
Neutrophils Relative %: 55 %
Platelets: 191 10*3/uL (ref 150–400)
RBC: 4.28 MIL/uL (ref 3.87–5.11)
RDW: 13.7 % (ref 11.5–15.5)
WBC: 7 10*3/uL (ref 4.0–10.5)
nRBC: 0 % (ref 0.0–0.2)

## 2020-12-16 LAB — URINALYSIS, COMPLETE (UACMP) WITH MICROSCOPIC
Bacteria, UA: NONE SEEN
Bilirubin Urine: NEGATIVE
Glucose, UA: NEGATIVE mg/dL
Hgb urine dipstick: NEGATIVE
Ketones, ur: NEGATIVE mg/dL
Leukocytes,Ua: NEGATIVE
Nitrite: NEGATIVE
Protein, ur: NEGATIVE mg/dL
Specific Gravity, Urine: 1.01 (ref 1.005–1.030)
pH: 6 (ref 5.0–8.0)

## 2020-12-16 LAB — RESP PANEL BY RT-PCR (FLU A&B, COVID) ARPGX2
Influenza A by PCR: NEGATIVE
Influenza B by PCR: NEGATIVE
SARS Coronavirus 2 by RT PCR: NEGATIVE

## 2020-12-16 MED ORDER — SODIUM CHLORIDE 0.9 % IV BOLUS
1000.0000 mL | Freq: Once | INTRAVENOUS | Status: AC
  Administered 2020-12-16: 1000 mL via INTRAVENOUS

## 2020-12-16 NOTE — ED Notes (Signed)
X-ray at bedside

## 2020-12-16 NOTE — ED Triage Notes (Addendum)
Pt to ED via [facility]. Pt found outside of a facility unresponsive by bystander. Pt with hx of seizures. Pt only alert to self. Pt did not have witnessed seizure by bystander or EMS.   Pt with hx alzheimer's, lupus and seizures. Pt stating she recently had a spinal cord stimulator placed. CBG 126. Pt ST with HR 130s. Pt stating she lives in an apartment in [place].

## 2020-12-16 NOTE — ED Provider Notes (Signed)
[facility] Emergency Department Provider Note  ____________________________________________  Time seen: Approximately 10:56 AM  I have reviewed the triage vital signs and the nursing notes.   HISTORY  Chief Complaint Loss of Consciousness    Level 5 Caveat: Portions of the History and Physical including HPI and review of systems are unable to be completely obtained due to patient being a poor historian   HPI Samantha Macias is a 60 y.o. female with a history of dementia, bipolar disorder, COPD, hypertension who was found on the ground outside of her residence unresponsive.  Last thing patient remembers she had taken her dog outside to play.  Denies any headache or lightheadedness, no chest pain or shortness of breath, no abdominal pain or back pain.  No acute symptoms but states that she does feel "confused."  EMS report that she was only oriented to self on their arrival.  Patient is able to tell me her current location, engage in coherent conversation, and tells me that she had a 930 appointment this morning with Dr. Rudene Christians for postop check of her right wrist, which I verified by chart review.  No history of head injury recently.  No dysuria or cough.  No body aches or fever.  EMS reported a seizure history, but patient denies.  Do not see any history of seizure based on chart review    Past Medical History:  Diagnosis Date   Abnormal gait    Allergy    Anxiety    Arthritis    Back pain    Bipolar disorder (HCC)    CAD (coronary artery disease)    Chronic kidney disease    Collagen vascular disease (HCC)    Colon polyp    COPD (chronic obstructive pulmonary disease) ([place])    Dementia ([place]) 11/2020   some memory loss issues. aricept   Falls    Fibromyalgia    GERD (gastroesophageal reflux disease)    erosive gastritis per EGD 07/2017   Hyperlipidemia    Hypertension    IBS (irritable bowel syndrome)    Kidney disease    Migraine     Numbness and tingling    hand and legs   Renal insufficiency    Spinal stenosis    Static encephalopathy    Thyroid nodule 07/2017   several nodules being followed by Dr. Nani Skillern 2005   no residual , except some memory loss   TIA (transient ischemic attack)      Patient Active Problem List   Diagnosis Date Noted   Epidural lipomatosis 08/25/2020   S/P insertion of spinal cord stimulator 08/18/2020   Lumbar lateral recess stenosis (L4-5) (Right) 12/22/2019   Lumbosacral foraminal stenosis (Left: L2, L5) (Bilateral: L4) 12/22/2019   Lumbar foraminal annular tear (Left: L2-3, L4-5) 12/22/2019   IVDD (Displacement of intervertebral disc) of lumbosacral region 12/22/2019   CRPS 1 (complex regional pain syndrome I) of lower limb (Left) 10/27/2019   Broken foot 07/28/2019   Neurogenic pain 07/16/2019   Chronic midline thoracic back pain 07/16/2019   Malaise and fatigue 05/21/2019   Chronic midline low back pain with left-sided sciatica 05/21/2019   Panic anxiety syndrome 05/21/2019   Spondylosis without myelopathy or radiculopathy, lumbosacral region 05/19/2019   History of allergy to radiographic contrast media 05/19/2019   History of anaphylaxis 05/19/2019   Chronic low back pain (Bilateral) (L>R) w/o sciatica 04/27/2019   Cervicalgia (Bilateral) 02/26/2019   History of allergy to shellfish 02/26/2019  Spondylosis without myelopathy or radiculopathy, thoracic region 01/08/2019   Dementia ([place]) 01/02/2019   SLE (systemic lupus erythematosus related syndrome) ([place]) 01/02/2019   Vitamin B 12 deficiency 01/02/2019   Thoracic facet syndrome 12/18/2018   Other specified spondylopathies, lumbosacral region ([place]) 11/12/2018   Preop testing 10/30/2018   Swelling of both hands 09/10/2018   Hallucinations, visual 07/03/2018   Imbalance 07/03/2018   Cigarette nicotine dependence with nicotine-induced disorder 07/03/2018   Memory loss or impairment 07/03/2018   Abnormal MRI, cervical  spine (04/16/2019) 04/03/2018   Abnormal MRI, lumbar spine (04/16/2019) 04/03/2018   DDD (degenerative disc disease), lumbar 04/03/2018   Lumbar facet hypertrophy (Multilevel) (Bilateral) 04/03/2018   Lumbar facet syndrome (Bilateral) 04/03/2018   Cervical facet hypertrophy (Bilateral) 04/03/2018   Cervical facet syndrome (Bilateral) 04/03/2018   Cervical foraminal stenosis 04/03/2018   Lumbar foraminal stenosis (Left: L2, L5) (Bilateral: L4) 04/03/2018   Cervical central spinal stenosis 04/03/2018   Lumbar central spinal stenosis (L4-5) 04/03/2018   Anterolisthesis 04/03/2018   Grade 1 Retrolisthesis of L5/S1 04/03/2018   Abnormal findings on diagnostic imaging of other parts of musculoskeletal system 04/03/2018   Spondylosis without myelopathy or radiculopathy, cervical region 04/03/2018   Chest pain 03/17/2018   Chronic low back pain (1ry area of Pain) (Bilateral) (L>R) w/ sciatica 03/17/2018   Chronic lower extremity pain (2ry area of Pain) (Bilateral) (L>R) 03/17/2018   Chronic upper back pain (3ry area of Pain) (Bilateral) (R>L) 03/17/2018   Chronic hand pain (Fourth Area of Pain) (Bilateral) (R>L) 03/17/2018   Chronic knee pain (Left) 03/17/2018   Pharmacologic therapy 03/17/2018   Disorder of skeletal system 03/17/2018   Problems influencing health status 03/17/2018   Long term current use of opiate analgesic 03/17/2018   Chronic hip pain (Left) 02/19/2018   Cervical spondylosis with myelopathy 07/02/2017   Cervical myelopathy ([place]) 06/27/2017   Lightheadedness 03/04/2017   Sleeping difficulty 03/04/2017   Mild cognitive disorder 11/21/2016   Mild cognitive impairment, so stated 11/21/2016   Cardiac syncope 11/16/2016   Precordial pain 11/16/2016   COPD, moderate ([place]) 10/09/2016   Anterior chest wall pain 09/19/2016   Tobacco user 05/09/2016   Insomnia secondary to depression with anxiety 04/17/2016   Loss of memory 04/11/2016   DDD (degenerative disc disease),  cervical 03/29/2016   Vitamin D deficiency 03/28/2016   HLD (hyperlipidemia) 03/28/2016   Anxiety 03/28/2016   Connective tissue disease ([place]) 02/17/2016   Arthralgia of multiple joints 02/17/2016   Thyromegaly 08/18/2015   Thyroid cyst 08/18/2015   Osteoarthritis of both hands 08/01/2015   Benign essential HTN 07/01/2015   Hypertension, benign 07/01/2015   Hyperparathyroidism, 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   Allergic rhinitis 12/01/2014   Anemia in chronic illness 12/01/2014   GAD (generalized anxiety disorder) 12/01/2014   Bipolar affective disorder, currently depressed, mild ([place]) 12/01/2014   Atherosclerosis of left carotid artery 12/01/2014   Bilateral carpal tunnel syndrome 12/01/2014   CKD (chronic kidney disease) stage 3, GFR 30-59 ml/min (HCC) 12/01/2014   Insomnia, persistent 12/01/2014   Chronic pain syndrome 12/01/2014   CN (constipation) 12/01/2014   Kidney cysts 12/01/2014   Cervical osteoarthritis 12/01/2014   Dyslipidemia 12/01/2014   Fibromyalgia syndrome 12/01/2014   Abnormal gait 12/01/2014   Gastro-esophageal reflux disease without esophagitis 12/01/2014   History of colon polyps 12/01/2014   History of syncope 12/01/2014   H/O transient cerebral ischemia 12/01/2014   HPV test positive 12/01/2014   Recurrent falls 12/01/2014   Calculus of kidney 12/01/2014  Lumbar radiculopathy 12/01/2014   OCD (obsessive compulsive disorder) 12/01/2014   Overweight 12/01/2014   PTSD (post-traumatic stress disorder) 12/01/2014   Trochanteric bursitis of hip (Bilateral) 12/01/2014   Type III hyperlipoproteinemia 02/19/2014     Past Surgical History:  Procedure Laterality Date   CARPAL TUNNEL RELEASE Right 10/27/2020   Procedure: CARPAL TUNNEL RELEASE;  Surgeon: Hessie Knows, MD;  Location: [facility] ORS;  Service: Orthopedics;  Laterality: Right;   COLONOSCOPY WITH PROPOFOL N/A 04/01/2017   Procedure: COLONOSCOPY WITH PROPOFOL;  Surgeon: Lollie Sails, MD;   Location[facility] ENDOSCOPY;  Service: Endoscopy;  Laterality: N/A;   DENTAL SURGERY  2019   ESOPHAGOGASTRODUODENOSCOPY (EGD) WITH PROPOFOL N/A 04/01/2017   Procedure: ESOPHAGOGASTRODUODENOSCOPY (EGD) WITH PROPOFOL;  Surgeon: Lollie Sails, MD;  Location: [facility] ENDOSCOPY;  Service: Endoscopy;  Laterality: N/A;   MOUTH SURGERY     NASAL SINUS SURGERY     OPEN REDUCTION INTERNAL FIXATION (ORIF) DISTAL RADIAL FRACTURE Right 10/27/2020   Procedure: OPEN REDUCTION INTERNAL FIXATION (ORIF) DISTAL RADIAL FRACTURE;  Surgeon: Hessie Knows, MD;  Location: [facility] ORS;  Service: Orthopedics;  Laterality: Right;   SPINAL CORD STIMULATOR INSERTION N/A 11/21/2020   Procedure: THORACIC PERCUTANEOUS SPINAL CORD STIMULATOR LEADS AND FLANK PULSE GENERATOR INSERTION;  Surgeon: Deetta Perla, MD;  Location: [facility] ORS;  Service: Neurosurgery;  Laterality: N/A;  schedule as 2nd case   TUBAL LIGATION       Prior to Admission medications   Medication Sig Start Date End Date Taking? Authorizing Provider  albuterol (PROVENTIL HFA;VENTOLIN HFA) 108 (90 Base) MCG/ACT inhaler Inhale 2 puffs into the lungs every 6 (six) hours as needed for wheezing or shortness of breath.    [provider]  ALPRAZolam Duanne Moron) 0.5 MG tablet Take 0.5 mg by mouth at bedtime as needed for sleep. 02/16/20   [provider]  atorvastatin (LIPITOR) 10 MG tablet Take 10 mg by mouth daily.    [provider]  bismuth subsalicylate (PEPTO BISMOL) 262 MG/15ML suspension Take 30 mLs by mouth every 6 (six) hours as needed for diarrhea or loose stools. Patient not taking: Reported on 11/21/2020    [provider]  buPROPion (WELLBUTRIN XL) 300 MG 24 hr tablet Take 1 tablet (300 mg total) by mouth daily. 11/14/19   Cloria Spring, MD  diphenhydramine-acetaminophen (TYLENOL PM EXTRA STRENGTH) 25-500 MG TABS tablet Take 2 tablets by mouth at bedtime as needed (sleep).    [provider]  donepezil (ARICEPT) 10 MG  tablet Take 10 mg by mouth at bedtime.    [provider]  ezetimibe (ZETIA) 10 MG tablet Take 10 mg by mouth daily.    [provider]  HYDROcodone-acetaminophen (NORCO/VICODIN) 5-325 MG tablet Take 1 tablet by mouth every 6 (six) hours as needed. 10/27/20   Hessie Knows, MD  losartan-hydrochlorothiazide (HYZAAR) 100-12.5 MG tablet Take 1 tablet by mouth daily. 12/01/15   Steele Sizer, MD  Melatonin 10 MG CAPS Take 20 mg by mouth at bedtime as needed (sleep).    [provider]  meloxicam (MOBIC) 15 MG tablet Take 15 mg by mouth daily. 10/29/19   [provider]  mirtazapine (REMERON) 15 MG tablet Take 15 mg by mouth at bedtime.    [provider]  ondansetron (ZOFRAN) 4 MG tablet Take 1 tablet (4 mg total) by mouth every 8 (eight) hours as needed for nausea or vomiting. 10/14/20 10/14/21  Duffy Bruce, MD  oxyCODONE (ROXICODONE) 5 MG immediate release tablet Take 1 tablet (5  mg total) by mouth every 8 (eight) hours as needed for breakthrough pain (in addition to your usual medications). 10/14/20 10/14/21  Duffy Bruce, MD  oxyCODONE (ROXICODONE) 5 MG immediate release tablet Take 1 tablet (5 mg total) by mouth every 4 (four) hours as needed for severe pain. 11/21/20   Deetta Perla, MD  pantoprazole (PROTONIX) 40 MG tablet Take 40 mg by mouth 2 (two) times daily before a meal. 07/15/17   [provider]  pregabalin (LYRICA) 200 MG capsule Take 1 capsule (200 mg total) by mouth 3 (three) times daily. 04/12/20 11/21/20  Milinda Pointer, MD  QUEtiapine (SEROQUEL) 100 MG tablet Take 200 mg by mouth at bedtime.    [provider]  ramelteon (ROZEREM) 8 MG tablet Take 8 mg by mouth at bedtime.    [provider]  Semaglutide (RYBELSUS) 14 MG TABS Take 14 mg by mouth daily. 03/09/20 03/09/21  [provider]  sertraline (ZOLOFT) 50 MG tablet Take 1 tablet (50 mg total) by mouth daily. Patient taking differently: Take 50 mg by  mouth at bedtime. 11/14/19   Cloria Spring, MD  SPIRIVA HANDIHALER 18 MCG inhalation capsule Place 1 puff into inhaler and inhale daily. 05/23/17   [provider]  tiZANidine (ZANAFLEX) 4 MG capsule Take 4 mg by mouth 3 (three) times daily.    [provider]  umeclidinium-vilanterol (ANORO ELLIPTA) 62.5-25 MCG/INH AEPB Inhale 1 puff into the lungs daily. 07/18/18   [provider]     Allergies Contrast media [iodinated diagnostic agents], Shellfish allergy, Tetanus toxoid adsorbed, Ace inhibitors, Codeine, Nitrofurantoin monohyd macro, and Red dye   Family History  Problem Relation Age of Onset   Congestive Heart Failure Father    Alcohol abuse Father    Heart disease Father    Heart attack Father    Peripheral vascular disease Mother    Anxiety disorder Mother    Depression Mother    Thyroid disease Mother    Heart disease Mother    Mental illness Mother    Bipolar disorder Mother     Social History Social History   Tobacco Use   Smoking status: Every Day    Packs/day: 0.25    Years: 25.00    Pack years: 6.25    Types: Cigarettes   Smokeless tobacco: Never   Tobacco comments:    3 cig/day  Vaping Use   Vaping Use: Never used  Substance Use Topics   Alcohol use: Yes    Alcohol/week: 2.0 standard drinks    Types: 2 Glasses of wine per week    Comment: occasionally    Drug use: Yes    Types: Marijuana    Comment: negative UDS on 07/31/2017    Review of Systems Level 5 Caveat: Portions of the History and Physical including HPI and review of systems are unable to be completely obtained due to patient being a poor historian   Constitutional:   No known fever.  ENT:   No rhinorrhea. Cardiovascular:   No chest pain or syncope. Respiratory:   No dyspnea or cough. Gastrointestinal:   Negative for abdominal pain, vomiting and diarrhea.  Musculoskeletal:   Negative for focal pain or  swelling ____________________________________________   PHYSICAL EXAM:  VITAL SIGNS: ED Triage Vitals  Enc Vitals Group     BP 12/16/20 0920 (!) 187/105     Pulse Rate 12/16/20 0920 (!) 119     Resp 12/16/20 0930 (!) 29     Temp 12/16/20  0920 98.4 F (36.9 C)     Temp Source 12/16/20 0920 Oral     SpO2 12/16/20 0920 94 %     Weight 12/16/20 0921 194 lb (88 kg)     Height --      Head Circumference --      Peak Flow --      Pain Score 12/16/20 0921 0     Pain Loc --      Pain Edu? --      Excl. in [place]? --     Vital signs reviewed, nursing assessments reviewed.   Constitutional:   Alert and oriented to person and place. Non-toxic appearance. Eyes:   Conjunctivae are normal. EOMI. PERRL. ENT      Head:   Normocephalic and atraumatic.      Nose:   No congestion/rhinnorhea.       Mouth/Throat:   MMM, no pharyngeal erythema. No peritonsillar mass.       Neck:   No meningismus. Full ROM. Hematological/Lymphatic/Immunilogical:   No cervical lymphadenopathy. Cardiovascular:   Tachycardia heart rate 110. Symmetric bilateral radial and DP pulses.  No murmurs. Cap refill less than 2 seconds. Respiratory:   Normal respiratory effort without tachypnea/retractions. Breath sounds are clear and equal bilaterally. No wheezes/rales/rhonchi. Gastrointestinal:   Soft and nontender. Non distended. There is no CVA tenderness.  No rebound, rigidity, or guarding. Genitourinary:   deferred Musculoskeletal:   Normal range of motion in all extremities. No joint effusions.  No lower extremity tenderness.  No edema. Neurologic:   Normal speech and language. Cranial nerves III through XII intact Motor grossly intact. No acute focal neurologic deficits are appreciated.  Skin:    Skin is warm, dry and intact. No rash noted.  No petechiae, purpura, or bullae.  ____________________________________________    LABS (pertinent positives/negatives) (all labs ordered are listed, but only abnormal results  are displayed) Labs Reviewed  COMPREHENSIVE METABOLIC PANEL - Abnormal; Notable for the following components:      Result Value   Potassium 2.8 (*)    Glucose, Bld 140 (*)    BUN 22 (*)    Creatinine, Ser 1.14 (*)    Calcium 8.7 (*)    GFR, Estimated 56 (*)    All other components within normal limits  RESP PANEL BY RT-PCR (FLU A&B, COVID) ARPGX2  CBC WITH DIFFERENTIAL/PLATELET  URINALYSIS, COMPLETE (UACMP) WITH MICROSCOPIC   ____________________________________________   EKG  Interpreted by me Sinus tachycardia rate 112.  Rightward axis.  Normal QRS ST segments and T waves.  No ischemic changes.  ____________________________________________    RADIOLOGY  DG Chest 1 View  Result Date: 12/16/2020 CLINICAL DATA:  Syncope and cough. EXAM: CHEST  1 VIEW COMPARISON:  PA and lateral chest 07/10/2017. FINDINGS: Lungs clear. Heart size normal. No pneumothorax or pleural fluid. No acute or focal bony abnormality. Thoracic spine stimulator noted. IMPRESSION: No acute disease. Electronically Signed   By: Inge Rise M.D.   On: 12/16/2020 10:02    ____________________________________________   PROCEDURES Procedures  ____________________________________________  DIFFERENTIAL DIAGNOSIS   Syncope, dehydration, electrolyte abnormality, viral illness, delirium  CLINICAL IMPRESSION / ASSESSMENT AND PLAN / ED COURSE  Medications ordered in the ED: Medications  sodium chloride 0.9 % bolus 1,000 mL (1,000 mLs Intravenous New Bag/Given 12/16/20 0935)    Pertinent labs & imaging results that were available during my care of the patient were reviewed by me and considered in my medical decision making (see chart for details).  Pam Vanalstine was evaluated in Emergency Department on 12/16/2020 for the symptoms described in the history of present illness. She was evaluated in the context of the global COVID-19 pandemic, which necessitated consideration that the patient might be at  risk for infection with the SARS-CoV-2 virus that causes COVID-19. Institutional protocols and algorithms that pertain to the evaluation of patients at risk for COVID-19 are in a state of rapid change based on information released by regulatory bodies including the CDC and federal and state organizations. These policies and algorithms were followed during the patient's care in the ED.   Patient presents after being found after apparent loss of consciousness.  No urinary incontinence or tongue biting.  Doubt seizure, suspect syncope.  Will check labs, COVID test, give IV fluids.  ----------------------------------------- 12:14 PM on 12/16/2020 ----------------------------------------- Patient feeling back to normal.  Orthostatic vital signs unremarkable, no dizziness with standing.  Work-up reassuring.  Heart rate has improved to about ED, blood pressure unremarkable.  Stable for discharge.      ____________________________________________   FINAL CLINICAL IMPRESSION(S) / ED DIAGNOSES    Final diagnoses:  None     ED Discharge Orders     None       Portions of this note were generated with dragon dictation software. Dictation errors may occur despite best attempts at proofreading.   Carrie Mew, MD 12/16/20 1214

## 2020-12-16 NOTE — ED Notes (Signed)
Friend at bedside.

## 2020-12-21 ENCOUNTER — Other Ambulatory Visit: Payer: Self-pay | Admitting: Internal Medicine

## 2020-12-21 DIAGNOSIS — Z1231 Encounter for screening mammogram for malignant neoplasm of breast: Secondary | ICD-10-CM

## 2021-03-07 ENCOUNTER — Encounter: Payer: Self-pay | Admitting: Pain Medicine

## 2021-03-08 ENCOUNTER — Ambulatory Visit: Payer: Medicare Other | Attending: Pain Medicine | Admitting: Pain Medicine

## 2021-03-08 ENCOUNTER — Other Ambulatory Visit: Payer: Self-pay

## 2021-03-08 DIAGNOSIS — G8929 Other chronic pain: Secondary | ICD-10-CM

## 2021-03-08 DIAGNOSIS — M79605 Pain in left leg: Secondary | ICD-10-CM

## 2021-03-08 DIAGNOSIS — M5442 Lumbago with sciatica, left side: Secondary | ICD-10-CM | POA: Diagnosis not present

## 2021-03-08 DIAGNOSIS — Z9682 Presence of neurostimulator: Secondary | ICD-10-CM | POA: Diagnosis not present

## 2021-03-08 DIAGNOSIS — T85192A Other mechanical complication of implanted electronic neurostimulator (electrode) of spinal cord, initial encounter: Secondary | ICD-10-CM | POA: Diagnosis not present

## 2021-03-08 DIAGNOSIS — G894 Chronic pain syndrome: Secondary | ICD-10-CM

## 2021-03-08 DIAGNOSIS — M79604 Pain in right leg: Secondary | ICD-10-CM | POA: Diagnosis not present

## 2021-03-08 DIAGNOSIS — G90522 Complex regional pain syndrome I of left lower limb: Secondary | ICD-10-CM

## 2021-03-08 DIAGNOSIS — M5136 Other intervertebral disc degeneration, lumbar region: Secondary | ICD-10-CM

## 2021-03-08 DIAGNOSIS — M5441 Lumbago with sciatica, right side: Secondary | ICD-10-CM

## 2021-03-08 NOTE — Progress Notes (Signed)
Patient: Samantha Macias  Service Category: E/M  Provider: Gaspar Cola, MD  DOB: [birth year]  DOS: 03/08/2021  Location: Office  MRN: [id number]  Setting: Ambulatory outpatient  Referring Provider: Tracie Harrier, MD  Type: Established Patient  Specialty: Interventional Pain Management  PCP: Tracie Harrier, MD  Location: Remote location  Delivery: TeleHealth     Virtual Encounter - Pain Management PROVIDER NOTE: Information contained herein reflects review and annotations entered in association with encounter. Interpretation of such information and data should be left to medically-trained personnel. Information provided to patient can be located elsewhere in the medical record under "Patient Instructions". Document created using STT-dictation technology, any transcriptional errors that may result from process are unintentional.    Contact & Pharmacy Preferred: [phone] Home: [phone] (home) Mobile: [phone] (mobile) E-mail: [email or web address]  [place], [place]. [place] [address] Phone: [phone] Fax: [phone]   Pre-screening  Samantha Macias offered "in-person" vs "virtual" encounter. She indicated preferring virtual for this encounter.   Reason COVID-19*  Social distancing based on CDC and AMA recommendations.   I contacted Samantha Macias on 03/08/2021 via telephone.      I clearly identified myself as Gaspar Cola, MD. I verified that I was speaking with the correct person using two identifiers (Name: Samantha Macias, and date of birth: 03/20/1961).  Consent I sought verbal advanced consent from Samantha Macias for virtual visit interactions. I informed Samantha Macias of possible security and privacy concerns, risks, and limitations associated with providing "not-in-person" medical evaluation and management services. I also informed Samantha Macias of the availability of "in-person" appointments.  Finally, I informed her that there would be a charge for the virtual visit and that she could be  personally, fully or partially, financially responsible for it. Samantha Macias expressed understanding and agreed to proceed.   Historic Elements   Samantha Macias is a 60 y.o. year old, female patient evaluated today after our last contact on 11/11/2020. Samantha Macias  has a past medical history of Abnormal gait, Allergy, Anxiety, Arthritis, Back pain, Bipolar disorder ([place]), CAD (coronary artery disease), Chronic kidney disease, Collagen vascular disease ([place]), Colon polyp, COPD (chronic obstructive pulmonary disease) ([place]), Dementia ([place]) (11/2020), Falls, Fibromyalgia, GERD (gastroesophageal reflux disease), Hyperlipidemia, Hypertension, IBS (irritable bowel syndrome), Kidney disease, Migraine, Numbness and tingling, Renal insufficiency, Spinal stenosis, Static encephalopathy, Thyroid nodule (07/2017), tia (2005), and TIA (transient ischemic attack). She also  has a past surgical history that includes Tubal ligation; Nasal sinus surgery; Colonoscopy with propofol (N/A, 04/01/2017); Esophagogastroduodenoscopy (egd) with propofol (N/A, 04/01/2017); Mouth surgery; Dental surgery (2019); Open reduction internal fixation (orif) distal radial fracture (Right, 10/27/2020); Carpal tunnel release (Right, 10/27/2020); and Spinal cord stimulator insertion (N/A, 11/21/2020). Samantha Macias has a current medication list which includes the following prescription(s): albuterol, alprazolam, atorvastatin, bupropion, tylenol pm extra strength, donepezil, ezetimibe, hydrocodone-acetaminophen, losartan-hydrochlorothiazide, melatonin, meloxicam, mirtazapine, ondansetron, oxycodone-acetaminophen, pantoprazole, quetiapine, ramelteon, rybelsus, sertraline, spiriva handihaler, tizanidine, anoro ellipta, and pregabalin. She  reports that she has been smoking cigarettes. She has a 6.25 pack-year smoking history. She has never used smokeless  tobacco. She reports current alcohol use of about 2.0 standard drinks per week. She reports current drug use. Drug: Marijuana. Samantha Macias is allergic to contrast media [iodinated diagnostic agents], shellfish allergy, tetanus toxoid adsorbed, ace inhibitors, codeine, nitrofurantoin monohyd macro, and red dye.   HPI  Today, she is being contacted for follow-up evaluation to answer some questions  about her implanted spinal cord stimulator.  The patient indicates that she is really disappointed with the implant since it is not working like the trial did.  She is concerned about the possibility of the leads having migrated.  She has also found that the device needs to be constantly recharging and she needs to be in bed to do that since it does not seem to have a good connection and therefore if she tries to ambulate it does not communicate correctly and does not chart.  She also complains about the battery hurting when the device is on and she also complains that he has been getting rather hot and swollen.  Immediately after the device implant she was given a business card by the [employer] representative, but as it turns out she has not been able to get a hold of them to have the device appropriately programmed.  She says that immediately after the surgery the device was turned on at a very low setting and she was told that it needed to be reprogrammed.  For one reason or another, apparently she was unable to connect with the representative and this reprogramming took place over a short period time, over the phone.  She is currently not very satisfied with the result of her implant and she had many questions regarding whether or not this is common.  Today I took a long time answering all of her questions and we decided that we needed to take some x-rays of the implant to make sure that it has not migrated and we will also be scheduling a visit in the clinic with one of the [employer] representatives to have them  come in and do a full diagnostic on the device and also to have it fine-tuned for her.  This plan was shared with the patient who understood and accepted.  Pharmacotherapy Assessment   Analgesic: None provided by our practice. NO OPIOIDS: UDS (08/07/17) (+) for Unreported Benzoylecgonine, a metabolite of cocaine; its presence    indicates use of this drug.  Source is most commonly illicit.   Monitoring: [place] PMP: PDMP reviewed during this encounter.       Pharmacotherapy: No side-effects or adverse reactions reported. Compliance: No problems identified. Effectiveness: Clinically acceptable. Plan: Refer to "POC". UDS:  Summary  Date Value Ref Range Status  03/17/2018 FINAL  Final    Comment:    ==================================================================== TOXAS[facility]RE COMP DRUG ANALYSIS,UR ==================================================================== Test                             Result       Flag       Units Drug Present and Declared for Prescription Verification   Tramadol                       1825         EXPECTED   ng/mg creat   O-Desmethyltramadol            929          EXPECTED   ng/mg creat   N-Desmethyltramadol            1352         EXPECTED   ng/mg creat    Source of tramadol is a prescription medication.    O-desmethyltramadol and N-desmethyltramadol are expected    metabolites of tramadol.   Pregabalin  PRESENT      EXPECTED   Tizanidine                     PRESENT      EXPECTED   Bupropion                      PRESENT      EXPECTED   Hydroxybupropion               PRESENT      EXPECTED    Hydroxybupropion is an expected metabolite of bupropion.   Mirtazapine                    PRESENT      EXPECTED   Quetiapine                     PRESENT      EXPECTED   Salicylate                     PRESENT      EXPECTED Drug Present not Declared for Prescription Verification   Benzoylecgonine                494          UNEXPECTED ng/mg creat     Benzoylecgonine is a metabolite of cocaine; its presence    indicates use of this drug.  Source is most commonly illicit, but    cocaine is present in some topical anesthetic solutions.   Acetaminophen                  PRESENT      UNEXPECTED   Diphenhydramine                PRESENT      UNEXPECTED Drug Absent but Declared for Prescription Verification   Alprazolam                     Not Detected UNEXPECTED ng/mg creat   Sertraline                     Not Detected UNEXPECTED   Diclofenac                     Not Detected UNEXPECTED    Topical diclofenac, as indicated in the declared medication list,    is not always detected even when used as directed.   Ibuprofen                      Not Detected UNEXPECTED    Ibuprofen, as indicated in the declared medication list, is not    always detected even when used as directed. ==================================================================== Test                      Result    Flag   Units      Ref Range   Creatinine              159              mg/dL      >=20 ==================================================================== Declared Medications:  The flagging and interpretation on this report are based on the  following declared medications.  Unexpected results may arise from  inaccuracies in the declared medications.  **Note: The testing scope of this panel includes these medications:  Alprazolam (Xanax)  Bupropion (Wellbutrin)  Mirtazapine (Remeron)  Pregabalin (Lyrica)  Quetiapine (Seroquel)  Sertraline (Zoloft)  Tramadol (Ultram)  **Note: The testing scope of this panel does not include small to  moderate amounts of these reported medications:  Aspirin (Aspirin 81)  Ibuprofen  Tizanidine (Zanaflex)  Topical Diclofenac  **Note: The testing scope of this panel does not include following  reported medications:  Albuterol  Donepezil (Aricept)  Formoterol (Perforomist)  Hydrochlorothiazide (Hyzaar)  Ipratropium  Losartan  (Hyzaar)  Omeprazole (Prilosec)  Pantoprazole (Protonix)  Ramelteon (Rozerem)  Tiotropium (Spiriva)  Vitamin D ==================================================================== For clinical consultation, please call [phone]. ====================================================================      Laboratory Chemistry Profile   Renal Lab Results  Component Value Date   BUN 22 (H) 12/16/2020   CREATININE 1.14 (H) 12/16/2020   BCR 16 03/17/2018   GFRAA 51 (L) 03/17/2018   GFRNONAA 56 (L) 12/16/2020    Hepatic Lab Results  Component Value Date   AST 20 12/16/2020   ALT 12 12/16/2020   ALBUMIN 3.7 12/16/2020   ALKPHOS 76 12/16/2020   LIPASE 37 12/24/2017   AMMONIA 23 07/07/2015    Electrolytes Lab Results  Component Value Date   NA 141 12/16/2020   K 2.8 (L) 12/16/2020   CL 107 12/16/2020   CALCIUM 8.7 (L) 12/16/2020   MG 1.8 03/17/2018    Bone Lab Results  Component Value Date   25OHVITD1 41 03/17/2018   25OHVITD2 <1.0 03/17/2018   25OHVITD3 41 03/17/2018    Inflammation (CRP: Acute Phase) (ESR: Chronic Phase) Lab Results  Component Value Date   CRP 17 (H) 03/17/2018   ESRSEDRATE 28 03/17/2018         Note: Above Lab results reviewed.  Imaging  DG Chest 1 View CLINICAL DATA:  Syncope and cough.  EXAM: CHEST  1 VIEW  COMPARISON:  PA and lateral chest 07/10/2017.  FINDINGS: Lungs clear. Heart size normal. No pneumothorax or pleural fluid. No acute or focal bony abnormality. Thoracic spine stimulator noted.  IMPRESSION: No acute disease.  Electronically Signed   By: Inge Rise M.D.   On: 12/16/2020 10:02  Assessment  The primary encounter diagnosis was Malfunction of spinal cord stimulator, initial encounter ([place]). Diagnoses of Neurostimulator device in situ, Presence of neurostimulator, Chronic low back pain (1ry area of Pain) (Bilateral) (L>R) w/ sciatica, Chronic lower extremity pain (2ry area of Pain) (Bilateral) (L>R), CRPS  1 (complex regional pain syndrome I) of lower limb (Left), DDD (degenerative disc disease), lumbar, Chronic pain syndrome, and Encounter for chronic pain management were also pertinent to this visit.  Plan of Care  Problem-specific:  No problem-specific Assessment & Plan notes found for this encounter.  Samantha Macias has a current medication list which includes the following long-term medication(s): albuterol, bupropion, tylenol pm extra strength, donepezil, ezetimibe, losartan-hydrochlorothiazide, pantoprazole, sertraline, spiriva handihaler, and pregabalin.  Pharmacotherapy (Medications Ordered): No orders of the defined types were placed in this encounter.  Orders:  Orders Placed This Encounter  Procedures   DG Thoracic Spine 4V    Please evaluate the patient spinal cord stimulator for lead integrity and location.  If possible, compare images with the intraoperative images obtained on the day of the implant.  Patient presents with axial pain with possible radicular component. Please assist [place] in identifying specific level(s) and laterality of any additional findings such as: 1. Facet (Zygapophyseal) joint DJD (Hypertrophy, space narrowing, subchondral sclerosis, and/or osteophyte formation) 2. DDD and/or IVDD (Loss of disc height, desiccation, gas patterns, osteophytes,  endplate sclerosis, or "Black disc disease") 3. Pars defects 4. Spondylolisthesis, spondylosis, and/or spondyloarthropathies (include Degree/Grade of displacement in mm) (stability) 5. Vertebral body Fractures (acute/chronic) (state percentage of collapse) 6. Demineralization (osteopenia/osteoporotic) 7. Bone pathology 8. Foraminal narrowing  9. Surgical changes    Standing Status:   Future    Standing Expiration Date:   06/08/2021    Order Specific Question:   Reason for Exam (SYMPTOM  OR DIAGNOSIS REQUIRED)    Answer:   Upper back pain and/or thoracic spine pain.    Order Specific Question:   Is patient  pregnant?    Answer:   No    Order Specific Question:   Preferred imaging location?    Answer:   [facility]    Order Specific Question:   Call Results- Best Contact Number?    Answer:   [phone] ([facility])   DG Lumbar Spine Complete W/Bend    Please evaluate the patient spinal cord stimulator for lead integrity and location.  If possible, compare images with the intraoperative images obtained on the day of the implant.  Patient presents with axial pain with possible radicular component. Please assist [place] in identifying specific level(s) and laterality of any additional findings such as: 1. Facet (Zygapophyseal) joint DJD (Hypertrophy, space narrowing, subchondral sclerosis, and/or osteophyte formation) 2. DDD and/or IVDD (Loss of disc height, desiccation, gas patterns, osteophytes, endplate sclerosis, or "Black disc disease") 3. Pars defects 4. Spondylolisthesis, spondylosis, and/or spondyloarthropathies (include Degree/Grade of displacement in mm) (stability) 5. Vertebral body Fractures (acute/chronic) (state percentage of collapse) 6. Demineralization (osteopenia/osteoporotic) 7. Bone pathology 8. Foraminal narrowing  9. Surgical changes    Standing Status:   Future    Standing Expiration Date:   04/08/2021    Scheduling Instructions:     Imaging must be done as soon as possible. Inform patient that order will expire within 30 days and I will not renew it.    Order Specific Question:   Reason for Exam (SYMPTOM  OR DIAGNOSIS REQUIRED)    Answer:   Low back pain    Order Specific Question:   Is patient pregnant?    Answer:   No    Order Specific Question:   Preferred imaging location?    Answer:   [facility]    Order Specific Question:   Call Results- Best Contact Number?    Answer:   [phone] ([facility])    Order Specific Question:   Radiology Contrast Protocol - do NOT remove file path    Answer:    [email or web address]    Order Specific Question:   Release to patient    Answer:   Immediate    Follow-up plan:   Return for Eval-day (M,W), (F2F) with [employer] representative for SCS reprogramming .     Interventional Therapies  Risk  Complexity Considerations:   Estimated body mass index is 27.37 kg/m as calculated from the following:   Height as of this encounter: $RemoveBeforeD'5\' 8"'[id number]$  (1.727 m).   Weight as of this encounter: 180 lb (81.6 kg).  Allergy to contrast dye (ANAPHYLACTIC)  Allergy to iodine/shellfish    Planned  Pending:   Appointment to have the spinal cord stimulator evaluated and reprogrammed.   Under consideration:      Completed:   Diagnostic bilateral lumbar spinal cord stimulator trial (08/18/2020)  Therapeutic/palliative left L3, L4 lumbar sympathetic block x6 (03/29/2020) (100/25/75/85) Diagnostic bilateral cervical facet MBB x1 (03/12/2019) (90/90/25 x2 weeks/0)  Diagnostic bilateral Thoracic T7, T8, T9, &  T10 Facet MBB x1 (01/08/2019) (80/100/100)  Diagnostic/therapeutic left CESI x1 (12/04/2018) (100/100/100)  Diagnostic/therapeutic right CESI x1 (04/19/2020) (70/70/0/0)  Diagnostic bilateral lumbar facet block x2 (04/14/2019) (100/100/100 x 2 days/0)  Palliative left lumbar facet RFA x1 (05/19/2019) (100/100/80/> 75)  Palliative right lumbar facet RFA x1 (06/30/2019) (100/100/80/> 75)    Therapeutic  Palliative (PRN) options:   Therapeutic/palliative left lumbar sympathetic block #7  Diagnostic bilateral cervical facet block #2  Diagnostic bilateral Thoracic T7, T8, T9, & T10 Facet MBB #2  Therapeutic left CESI #2  Therapeutic right CESI #2  Palliative bilateral lumbar facet MBB #3  Palliative left lumbar facet RFA #2  Palliative right lumbar facet RFA #1     Recent Visits No visits were found meeting these conditions. Showing recent visits within past 90 days and meeting all other requirements Today's Visits Date Type  Provider Dept  03/08/21 Office Visit Milinda Pointer, MD [facility]  Showing today's visits and meeting all other requirements Future Appointments Date Type Provider Dept  05/01/21 Appointment Milinda Pointer, MD [facility]  Showing future appointments within next 90 days and meeting all other requirements I discussed the assessment and treatment plan with the patient. The patient was provided an opportunity to ask questions and all were answered. The patient agreed with the plan and demonstrated an understanding of the instructions.  Patient advised to call back or seek an in-person evaluation if the symptoms or condition worsens.  Duration of encounter: 27 minutes.  Note by: Gaspar Cola, MD Date: 03/08/2021; Time: 6:20 PM

## 2021-03-10 ENCOUNTER — Telehealth: Payer: Self-pay | Admitting: Pain Medicine

## 2021-03-10 NOTE — Telephone Encounter (Signed)
I tried to call patient to see if [employer] had contacted her about the problems she is having with her SCS. No Answer, no vmail. She says she has been unable to contact [employer]. Can you please call [employer] and have them get in touch with patient. Thanks.

## 2021-03-10 NOTE — Telephone Encounter (Signed)
Contacted [employer] tech support, they will call patient. Patient notified.

## 2021-03-13 ENCOUNTER — Encounter: Payer: Self-pay | Admitting: Pain Medicine

## 2021-03-13 ENCOUNTER — Telehealth: Payer: Self-pay | Admitting: Pain Medicine

## 2021-03-13 NOTE — Telephone Encounter (Signed)
Patient called. Has an appointment on Thursday with Dr. Dossie Arbour. X-ray orders will expire today and patient is still having issues with her tooth abscess. She is to contact her dentist today for follow up. I also confirmed with Manuela Schwartz at [facility] that a rep will be here on Thursday for patient's appointment to check her SCS. I will get Dr. Dossie Arbour to reorder the x-rays so that patient can get them done prior to her visit on Thursday if she is medically able due to her abscess. Patient agrees with POC.

## 2021-03-16 ENCOUNTER — Ambulatory Visit: Payer: Medicare Other | Admitting: Pain Medicine

## 2021-05-01 ENCOUNTER — Telehealth: Payer: Self-pay | Admitting: Pain Medicine

## 2021-05-01 ENCOUNTER — Ambulatory Visit: Payer: Medicare Other | Admitting: Pain Medicine

## 2021-05-01 NOTE — Telephone Encounter (Signed)
Spoke with patient and Jocelyn Lamer about this appointment and possible mix up. OK to put her on F2F 05/03/2021 at 2pm per Jocelyn Lamer. Patient called and appointment confirmed. Patient put on the schedule per [facility].

## 2021-05-03 ENCOUNTER — Ambulatory Visit: Payer: Medicare Other | Attending: Pain Medicine | Admitting: Pain Medicine

## 2021-05-03 ENCOUNTER — Other Ambulatory Visit: Payer: Self-pay

## 2021-05-03 ENCOUNTER — Encounter: Payer: Self-pay | Admitting: Pain Medicine

## 2021-05-03 VITALS — BP 156/89 | HR 83 | Temp 96.8°F | Resp 18 | Ht 68.0 in | Wt 162.0 lb

## 2021-05-03 DIAGNOSIS — M5441 Lumbago with sciatica, right side: Secondary | ICD-10-CM | POA: Insufficient documentation

## 2021-05-03 DIAGNOSIS — M4802 Spinal stenosis, cervical region: Secondary | ICD-10-CM | POA: Diagnosis present

## 2021-05-03 DIAGNOSIS — M542 Cervicalgia: Secondary | ICD-10-CM | POA: Diagnosis present

## 2021-05-03 DIAGNOSIS — M79604 Pain in right leg: Secondary | ICD-10-CM | POA: Insufficient documentation

## 2021-05-03 DIAGNOSIS — M47812 Spondylosis without myelopathy or radiculopathy, cervical region: Secondary | ICD-10-CM

## 2021-05-03 DIAGNOSIS — Z9682 Presence of neurostimulator: Secondary | ICD-10-CM

## 2021-05-03 DIAGNOSIS — M549 Dorsalgia, unspecified: Secondary | ICD-10-CM | POA: Diagnosis present

## 2021-05-03 DIAGNOSIS — G90522 Complex regional pain syndrome I of left lower limb: Secondary | ICD-10-CM | POA: Diagnosis present

## 2021-05-03 DIAGNOSIS — G8929 Other chronic pain: Secondary | ICD-10-CM | POA: Diagnosis present

## 2021-05-03 DIAGNOSIS — M79605 Pain in left leg: Secondary | ICD-10-CM | POA: Insufficient documentation

## 2021-05-03 DIAGNOSIS — M5030 Other cervical disc degeneration, unspecified cervical region: Secondary | ICD-10-CM

## 2021-05-03 DIAGNOSIS — R937 Abnormal findings on diagnostic imaging of other parts of musculoskeletal system: Secondary | ICD-10-CM | POA: Diagnosis present

## 2021-05-03 DIAGNOSIS — G959 Disease of spinal cord, unspecified: Secondary | ICD-10-CM

## 2021-05-03 DIAGNOSIS — M5442 Lumbago with sciatica, left side: Secondary | ICD-10-CM | POA: Diagnosis not present

## 2021-05-03 DIAGNOSIS — T85192A Other mechanical complication of implanted electronic neurostimulator (electrode) of spinal cord, initial encounter: Secondary | ICD-10-CM

## 2021-05-03 NOTE — Patient Instructions (Signed)
______________________________________________________________________  Preparing for Procedure with Sedation  NOTICE: Due to recent regulatory changes, starting on January 02, 2021, procedures requiring intravenous (IV) sedation will no longer be performed at the [facility].  These types of procedures are required to be performed at [facility] ambulatory surgery facility.  We are very sorry for the inconvenience.  Procedure appointments are limited to planned procedures: No Prescription Refills. No disability issues will be discussed. No medication changes will be discussed.  Instructions: Oral Intake: Do not eat or drink anything for at least 8 hours prior to your procedure. (Exception: Blood Pressure Medication. See below.) Transportation: A driver is required. You may not drive yourself after the procedure. Blood Pressure Medicine: Do not forget to take your blood pressure medicine with a sip of water the morning of the procedure. If your Diastolic (lower reading) is above 100 mmHg, elective cases will be cancelled/rescheduled. Blood thinners: These will need to be stopped for procedures. Notify our staff if you are taking any blood thinners. Depending on which one you take, there will be specific instructions on how and when to stop it. Diabetics on insulin: Notify the staff so that you can be scheduled 1st case in the morning. If your diabetes requires high dose insulin, take only  of your normal insulin dose the morning of the procedure and notify the staff that you have done so. Preventing infections: Shower with an antibacterial soap the morning of your procedure. Build-up your immune system: Take 1000 mg of Vitamin C with every meal (3 times a day) the day prior to your procedure. Antibiotics: Inform the staff if you have a condition or reason that requires you to take antibiotics before dental procedures. Pregnancy: If you are pregnant, call and cancel the procedure. Sickness: If  you have a cold, fever, or any active infections, call and cancel the procedure. Arrival: You must be in the facility at least 30 minutes prior to your scheduled procedure. Children: Do not bring children with you. Dress appropriately: Bring dark clothing that you would not mind if they get stained. Valuables: Do not bring any jewelry or valuables.  Reasons to call and reschedule or cancel your procedure: (Following these recommendations will minimize the risk of a serious complication.) Surgeries: Avoid having procedures within 2 weeks of any surgery. (Avoid for 2 weeks before or after any surgery). Flu Shots: Avoid having procedures within 2 weeks of a flu shots. (Avoid for 2 weeks before or after immunizations). Barium: Avoid having a procedure within 7-10 days after having had a radiological study involving the use of radiological contrast. (Myelograms, Barium swallow or enema study). Heart attacks: Avoid any elective procedures or surgeries for the initial 6 months after a "Myocardial Infarction" (Heart Attack). Blood thinners: It is imperative that you stop these medications before procedures. Let us know if you if you take any blood thinner.  Infection: Avoid procedures during or within two weeks of an infection (including chest colds or gastrointestinal problems). Symptoms associated with infections include: Localized redness, fever, chills, night sweats or profuse sweating, burning sensation when voiding, cough, congestion, stuffiness, runny nose, sore throat, diarrhea, nausea, vomiting, cold or Flu symptoms, recent or current infections. It is specially important if the infection is over the area that we intend to treat. Heart and lung problems: Symptoms that may suggest an active cardiopulmonary problem include: cough, chest pain, breathing difficulties or shortness of breath, dizziness, ankle swelling, uncontrolled high or unusually low blood pressure, and/or palpitations. If you are    experiencing any of these symptoms, cancel your procedure and contact your primary care physician for an evaluation.  Remember:  Regular Business hours are:  Monday to Thursday 8:00 AM to 4:00 PM  Provider's Schedule: Harlo Jaso, MD:  Procedure days: Tuesday and Thursday 7:30 AM to 4:00 PM  Bilal Lateef, MD:  Procedure days: Monday and Wednesday 7:30 AM to 4:00 PM ______________________________________________________________________  ____________________________________________________________________________________________  General Risks and Possible Complications  Patient Responsibilities: It is important that you read this as it is part of your informed consent. It is our duty to inform you of the risks and possible complications associated with treatments offered to you. It is your responsibility as a patient to read this and to ask questions about anything that is not clear or that you believe was not covered in this document.  Patient's Rights: You have the right to refuse treatment. You also have the right to change your mind, even after initially having agreed to have the treatment done. However, under this last option, if you wait until the last second to change your mind, you may be charged for the materials used up to that point.  Introduction: Medicine is not an exact science. Everything in Medicine, including the lack of treatment(s), carries the potential for danger, harm, or loss (which is by definition: Risk). In Medicine, a complication is a secondary problem, condition, or disease that can aggravate an already existing one. All treatments carry the risk of possible complications. The fact that a side effects or complications occurs, does not imply that the treatment was conducted incorrectly. It must be clearly understood that these can happen even when everything is done following the highest safety standards.  No treatment: You can choose not to proceed with the  proposed treatment alternative. The "PRO(s)" would include: avoiding the risk of complications associated with the therapy. The "CON(s)" would include: not getting any of the treatment benefits. These benefits fall under one of three categories: diagnostic; therapeutic; and/or palliative. Diagnostic benefits include: getting information which can ultimately lead to improvement of the disease or symptom(s). Therapeutic benefits are those associated with the successful treatment of the disease. Finally, palliative benefits are those related to the decrease of the primary symptoms, without necessarily curing the condition (example: decreasing the pain from a flare-up of a chronic condition, such as incurable terminal cancer).  General Risks and Complications: These are associated to most interventional treatments. They can occur alone, or in combination. They fall under one of the following six (6) categories: no benefit or worsening of symptoms; bleeding; infection; nerve damage; allergic reactions; and/or death. No benefits or worsening of symptoms: In Medicine there are no guarantees, only probabilities. No healthcare provider can ever guarantee that a medical treatment will work, they can only state the probability that it may. Furthermore, there is always the possibility that the condition may worsen, either directly, or indirectly, as a consequence of the treatment. Bleeding: This is more common if the patient is taking a blood thinner, either prescription or over the counter (example: Goody Powders, Fish oil, Aspirin, Garlic, etc.), or if suffering a condition associated with impaired coagulation (example: Hemophilia, cirrhosis of the liver, low platelet counts, etc.). However, even if you do not have one on these, it can still happen. If you have any of these conditions, or take one of these drugs, make sure to notify your treating physician. Infection: This is more common in patients with a compromised  immune system, either due to disease (example:   diabetes, cancer, human immunodeficiency virus [HIV], etc.), or due to medications or treatments (example: therapies used to treat cancer and rheumatological diseases). However, even if you do not have one on these, it can still happen. If you have any of these conditions, or take one of these drugs, make sure to notify your treating physician. Nerve Damage: This is more common when the treatment is an invasive one, but it can also happen with the use of medications, such as those used in the treatment of cancer. The damage can occur to small secondary nerves, or to large primary ones, such as those in the spinal cord and brain. This damage may be temporary or permanent and it may lead to impairments that can range from temporary numbness to permanent paralysis and/or brain death. Allergic Reactions: Any time a substance or material comes in contact with our body, there is the possibility of an allergic reaction. These can range from a mild skin rash (contact dermatitis) to a severe systemic reaction (anaphylactic reaction), which can result in death. Death: In general, any medical intervention can result in death, most of the time due to an unforeseen complication. ____________________________________________________________________________________________  

## 2021-05-03 NOTE — Progress Notes (Signed)
Safety precautions to be maintained throughout the outpatient stay will include: orient to surroundings, keep bed in low position, maintain call bell within reach at all times, provide assistance with transfer out of bed and ambulation.  

## 2021-05-03 NOTE — Progress Notes (Signed)
PROVIDER NOTE: Information contained herein reflects review and annotations entered in association with encounter. Interpretation of such information and data should be left to medically-trained personnel. Information provided to patient can be located elsewhere in the medical record under "Patient Instructions". Document created using STT-dictation technology, any transcriptional errors that may result from process are unintentional.    Patient: Samantha Macias  Service Category: E/M  Provider: Gaspar Cola, MD  DOB: 11/05/1960  DOS: 05/03/2021  Specialty: Interventional Pain Management  MRN: [id number]  Setting: Ambulatory outpatient  PCP: Tracie Harrier, MD  Type: Established Patient    Referring Provider: Tracie Harrier, MD  Location: Office  Delivery: Face-to-face     HPI  Ms. Kalika Smay, a 60 y.o. year old female, is here today because of her Chronic bilateral low back pain with bilateral sciatica [M54.42, M54.41, G89.29]. Ms. Odeh primary complain today is Neck Pain Last encounter: My last encounter with her was on 05/01/2021. Pertinent problems: Ms. Martone has Bilateral carpal tunnel syndrome; Chronic pain syndrome; Cervical osteoarthritis; Fibromyalgia syndrome; Abnormal gait; Recurrent falls; Lumbar radiculopathy; Trochanteric bursitis of hip (Bilateral); Osteoarthritis of both hands; Arthralgia of multiple joints; Chronic hip pain (Left); Cervical myelopathy (HCC); DDD (degenerative disc disease), cervical; Cervical spondylosis with myelopathy; Chronic low back pain (1ry area of Pain) (Bilateral) (L>R) w/ sciatica; Chronic lower extremity pain (2ry area of Pain) (Bilateral) (L>R); Chronic upper back pain (3ry area of Pain) (Bilateral) (R>L); Chronic hand pain (Fourth Area of Pain) (Bilateral) (R>L); Chronic knee pain (Left); Abnormal MRI, cervical spine (04/16/2019); Abnormal MRI, lumbar spine (04/16/2019); DDD (degenerative disc disease), lumbar; Lumbar facet  hypertrophy (Multilevel) (Bilateral); Lumbar facet syndrome (Bilateral); Cervical facet hypertrophy (Bilateral); Cervical facet syndrome (Bilateral); Cervical foraminal stenosis; Lumbar foraminal stenosis (Left: L2, L5) (Bilateral: L4); Cervical central spinal stenosis; Lumbar central spinal stenosis (L4-5); Anterolisthesis; Grade 1 Retrolisthesis of L5/S1; Other specified spondylopathies, lumbosacral region [facility]); Abnormal findings on diagnostic imaging of other parts of musculoskeletal system; Swelling of both hands; Spondylosis without myelopathy or radiculopathy, cervical region; Thoracic facet syndrome; Spondylosis without myelopathy or radiculopathy, thoracic region; SLE (systemic lupus erythematosus related syndrome) ([place]); Cervicalgia (Bilateral); Chronic low back pain (Bilateral) (L>R) w/o sciatica; Spondylosis without myelopathy or radiculopathy, lumbosacral region; Chronic midline low back pain with left-sided sciatica; Neurogenic pain; Chronic midline thoracic back pain; Broken foot; CRPS 1 (complex regional pain syndrome I) of lower limb (Left); Lumbar lateral recess stenosis (L4-5) (Right); Lumbosacral foraminal stenosis (Left: L2, L5) (Bilateral: L4); Lumbar foraminal annular tear (Left: L2-3, L4-5); 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 Pain Assessment: Severity of Chronic pain is reported as a 7 /10. Location: Neck Posterior/Radiates from nape of neck into spine into lower back. Numbness on on left arm when she lays in left side from forearm into the fingers.. Onset: More than a month ago. Quality: Constant, Numbness, Tingling, Shooting, Discomfort. Timing: Constant. Modifying factor(s): "Warm baths and pain medicine". Vitals:  height is 5' 8" (1.727 m) and weight is  162 lb (73.5 kg). Her temporal temperature is 96.8 F (36 C) (abnormal). Her blood pressure is 156/89 (abnormal) and her pulse is 83. Her respiration is 18 and oxygen saturation is 98%.   Reason for encounter: follow-up evaluation.  Today the patient comes in indicating that the primary reason for this visit is to see if we can schedule  her for some shots in the area of the neck.  She is complaining about pain in the posterior neck area, midline, with pain being referred towards the right upper extremity including numbness going all the way down into her hand.  This appears to be a cervical radiculitis.  For this reason we will go ahead and schedule her for a right cervical epidural steroid injection under fluoroscopic guidance.  The patient was last seen on 03/08/2021 at which time we answer some questions that she had regarding her spinal cord stimulator implant. The patient indicated that she is really disappointed with the implant since it is not working like the trial did.  She is concerned about the possibility of the leads having migrated.  She has also found that the device needs to be constantly recharging and she needs to be in bed to do that since it does not seem to have a good connection and therefore if she tries to ambulate it does not communicate correctly and does not chart.  She also complains about the battery hurting when the device is on and she also complains that he has been getting rather hot and swollen.   Immediately after the device implant she was given a business card by the [employer] representative, but as it turns out she has not been able to get a hold of them to have the device appropriately programmed.  She says that immediately after the surgery the device was turned on at a very low setting and she was told that it needed to be reprogrammed.  For one reason or another, apparently she was unable to connect with the representative and this reprogramming took place over a short  period time, over the phone.   She is currently not very satisfied with the result of her implant and she had many questions regarding whether or not this is common.  To evaluate whether or not the device had migrated, on 03/08/2021 I ordered x-rays of the thoracic spine to compare the lead placement to the post-implant x-rays taken by Dr. Lysle Morales on 11/21/2020.  Unfortunately, by the time of this evaluation, the patient did not have these x-rays done.  Apparently the patient called on 03/13/2021 and she had not had the x-rays done and apparently the order was to expire on that date, according to the nurse Anderson Malta).  However, when I personally checked the order it indicates that it will not expire until 06/08/2021.  Efforts have been made to schedule the patient to come in at the same time as the [employer] representative, but apparently the patient had an issue with a tooth abscess and had rescheduled.  Today I have reminded the patient that we need to have that thoracic x-ray done and we also need to schedule her with a [employer] representative to come in and evaluate her device and perhaps change to programming since she indicates that she still getting zapped by the spinal cord stimulator and for this reason she has not been using it.  Pharmacotherapy Assessment  Analgesic: None provided by our practice. NO OPIOIDS: UDS (08/07/17) (+) for Unreported Benzoylecgonine, a metabolite of cocaine; its presence    indicates use of this drug.  Source is most commonly illicit.   Monitoring: [place] PMP: PDMP reviewed during this encounter.       Pharmacotherapy: No side-effects or adverse reactions reported. Compliance: No problems identified. Effectiveness: Clinically acceptable.  Al Decant, RN  05/03/2021  2:09 PM  Sign when Signing Visit Safety  precautions to be maintained throughout the outpatient stay will include: orient to surroundings, keep bed in low position, maintain call bell within reach  at all times, provide assistance with transfer out of bed and ambulation.     UDS:  Summary  Date Value Ref Range Status  03/17/2018 FINAL  Final    Comment:    ==================================================================== [facility] COMP DRUG ANALYSIS,UR ==================================================================== Test                             Result       Flag       Units Drug Present and Declared for Prescription Verification   Tramadol                       1825         EXPECTED   ng/mg creat   O-Desmethyltramadol            929          EXPECTED   ng/mg creat   N-Desmethyltramadol            1352         EXPECTED   ng/mg creat    Source of tramadol is a prescription medication.    O-desmethyltramadol and N-desmethyltramadol are expected    metabolites of tramadol.   Pregabalin                     PRESENT      EXPECTED   Tizanidine                     PRESENT      EXPECTED   Bupropion                      PRESENT      EXPECTED   Hydroxybupropion               PRESENT      EXPECTED    Hydroxybupropion is an expected metabolite of bupropion.   Mirtazapine                    PRESENT      EXPECTED   Quetiapine                     PRESENT      EXPECTED   Salicylate                     PRESENT      EXPECTED Drug Present not Declared for Prescription Verification   Benzoylecgonine                494          UNEXPECTED ng/mg creat    Benzoylecgonine is a metabolite of cocaine; its presence    indicates use of this drug.  Source is most commonly illicit, but    cocaine is present in some topical anesthetic solutions.   Acetaminophen                  PRESENT      UNEXPECTED   Diphenhydramine                PRESENT      UNEXPECTED Drug Absent but Declared for Prescription Verification   Alprazolam  Not Detected UNEXPECTED ng/mg creat   Sertraline                     Not Detected UNEXPECTED   Diclofenac                     Not Detected UNEXPECTED     Topical diclofenac, as indicated in the declared medication list,    is not always detected even when used as directed.   Ibuprofen                      Not Detected UNEXPECTED    Ibuprofen, as indicated in the declared medication list, is not    always detected even when used as directed. ==================================================================== Test                      Result    Flag   Units      Ref Range   Creatinine              159              mg/dL      >=20 ==================================================================== Declared Medications:  The flagging and interpretation on this report are based on the  following declared medications.  Unexpected results may arise from  inaccuracies in the declared medications.  **Note: The testing scope of this panel includes these medications:  Alprazolam (Xanax)  Bupropion (Wellbutrin)  Mirtazapine (Remeron)  Pregabalin (Lyrica)  Quetiapine (Seroquel)  Sertraline (Zoloft)  Tramadol (Ultram)  **Note: The testing scope of this panel does not include small to  moderate amounts of these reported medications:  Aspirin (Aspirin 81)  Ibuprofen  Tizanidine (Zanaflex)  Topical Diclofenac  **Note: The testing scope of this panel does not include following  reported medications:  Albuterol  Donepezil (Aricept)  Formoterol (Perforomist)  Hydrochlorothiazide (Hyzaar)  Ipratropium  Losartan (Hyzaar)  Omeprazole (Prilosec)  Pantoprazole (Protonix)  Ramelteon (Rozerem)  Tiotropium (Spiriva)  Vitamin D ==================================================================== For clinical consultation, please call [phone]. ====================================================================      ROS  Constitutional: Denies any fever or chills Gastrointestinal: No reported hemesis, hematochezia, vomiting, or acute GI distress Musculoskeletal: Denies any acute onset joint swelling, redness, loss of ROM, or  weakness Neurological: No reported episodes of acute onset apraxia, aphasia, dysarthria, agnosia, amnesia, paralysis, loss of coordination, or loss of consciousness  Medication Review  ALPRAZolam, Melatonin, QUEtiapine, albuterol, atorvastatin, buPROPion, diphenhydramine-acetaminophen, donepezil, ezetimibe, losartan-hydrochlorothiazide, meloxicam, mirtazapine, ondansetron, oxyCODONE-acetaminophen, pantoprazole, pregabalin, ramelteon, sertraline, tiZANidine, tiotropium, and umeclidinium-vilanterol  History Review  Allergy: Ms. Masih is allergic to contrast media [iodinated diagnostic agents], shellfish allergy, tetanus toxoid adsorbed, ace inhibitors, codeine, nitrofurantoin monohyd macro, and red dye. Drug: Ms. Capshaw  reports current drug use. Drug: Marijuana. Alcohol:  reports current alcohol use of about 2.0 standard drinks per week. Tobacco:  reports that she has been smoking cigarettes. She has a 6.25 pack-year smoking history. She has never used smokeless tobacco. Social: Ms. Scarlett  reports that she has been smoking cigarettes. She has a 6.25 pack-year smoking history. She has never used smokeless tobacco. She reports current alcohol use of about 2.0 standard drinks per week. She reports current drug use. Drug: Marijuana. Medical:  has a past medical history of Abnormal gait, Allergy, Anxiety, Arthritis, Back pain, Bipolar disorder (HCC), CAD (coronary artery disease), Chronic kidney disease, Collagen vascular disease ([place]), Colon polyp, COPD (chronic obstructive pulmonary disease) ([place]), Dementia ([place]) (11/2020), Falls, Fibromyalgia, GERD (  gastroesophageal reflux disease), Hyperlipidemia, Hypertension, IBS (irritable bowel syndrome), Kidney disease, Migraine, Numbness and tingling, Renal insufficiency, Spinal stenosis, Static encephalopathy, Thyroid nodule (07/2017), tia (2005), and TIA (transient ischemic attack). Surgical: Ms. Popiel  has a past surgical history that includes Tubal ligation;  Nasal sinus surgery; Colonoscopy with propofol (N/A, 04/01/2017); Esophagogastroduodenoscopy (egd) with propofol (N/A, 04/01/2017); Mouth surgery; Dental surgery (2019); Open reduction internal fixation (orif) distal radial fracture (Right, 10/27/2020); Carpal tunnel release (Right, 10/27/2020); and Spinal cord stimulator insertion (N/A, 11/21/2020). Family: family history includes Alcohol abuse in her father; Anxiety disorder in her mother; Bipolar disorder in her mother; Congestive Heart Failure in her father; Depression in her mother; Heart attack in her father; Heart disease in her father and mother; Mental illness in her mother; Peripheral vascular disease in her mother; Thyroid disease in her mother.  Laboratory Chemistry Profile   Renal Lab Results  Component Value Date   BUN 22 (H) 12/16/2020   CREATININE 1.14 (H) 12/16/2020   BCR 16 03/17/2018   GFRAA 51 (L) 03/17/2018   GFRNONAA 56 (L) 12/16/2020    Hepatic Lab Results  Component Value Date   AST 20 12/16/2020   ALT 12 12/16/2020   ALBUMIN 3.7 12/16/2020   ALKPHOS 76 12/16/2020   LIPASE 37 12/24/2017   AMMONIA 23 07/07/2015    Electrolytes Lab Results  Component Value Date   NA 141 12/16/2020   K 2.8 (L) 12/16/2020   CL 107 12/16/2020   CALCIUM 8.7 (L) 12/16/2020   MG 1.8 03/17/2018    Bone Lab Results  Component Value Date   25OHVITD1 41 03/17/2018   25OHVITD2 <1.0 03/17/2018   25OHVITD3 41 03/17/2018    Inflammation (CRP: Acute Phase) (ESR: Chronic Phase) Lab Results  Component Value Date   CRP 17 (H) 03/17/2018   ESRSEDRATE 28 03/17/2018         Note: Above Lab results reviewed.  Recent Imaging Review  DG Chest 1 View CLINICAL DATA:  Syncope and cough.  EXAM: CHEST  1 VIEW  COMPARISON:  PA and lateral chest 07/10/2017.  FINDINGS: Lungs clear. Heart size normal. No pneumothorax or pleural fluid. No acute or focal bony abnormality. Thoracic spine stimulator noted.  IMPRESSION: No acute  disease.  Electronically Signed   By: Inge Rise M.D.   On: 12/16/2020 10:02 Note: Reviewed        Physical Exam  General appearance: Well nourished, well developed, and well hydrated. In no apparent acute distress Mental status: Alert, oriented x 3 (person, place, & time)       Respiratory: No evidence of acute respiratory distress Eyes: PERLA Vitals: BP (!) 156/89   Pulse 83   Temp (!) 96.8 F (36 C) (Temporal)   Resp 18   Ht 5' 8" (1.727 m)   Wt 162 lb (73.5 kg)   SpO2 98%   BMI 24.63 kg/m  BMI: Estimated body mass index is 24.63 kg/m as calculated from the following:   Height as of this encounter: 5' 8" (1.727 m).   Weight as of this encounter: 162 lb (73.5 kg). Ideal: Ideal body weight: 63.9 kg (140 lb 14 oz) Adjusted ideal body weight: 67.7 kg (149 lb 5.2 oz)  Assessment   Status Diagnosis  Controlled Controlled Controlled 1. Chronic low back pain (1ry area of Pain) (Bilateral) (L>R) w/ sciatica   2. Chronic lower extremity pain (2ry area of Pain) (Bilateral) (L>R)   3. CRPS 1 (complex regional pain syndrome I) of lower limb (Left)  4. Malfunction of spinal cord stimulator, initial encounter ([place])   5. Neurostimulator device in situ   6. Presence of neurostimulator   7. Abnormal MRI, cervical spine (04/16/2019)   8. Cervical central spinal stenosis   9. Cervical facet hypertrophy (Bilateral)   10. Cervical foraminal stenosis   11. Cervical myelopathy ([place])   12. Cervicalgia (Bilateral)   13. Chronic upper back pain (3ry area of Pain) (Bilateral) (R>L)   14. DDD (degenerative disc disease), cervical      Updated Problems: Problem  Neurostimulator Device in Situ  Presence of Neurostimulator  Malfunction of Spinal Cord Stimulator (Hcc)    Plan of Care  Problem-specific:  No problem-specific Assessment & Plan notes found for this encounter.  Ms. Samiksha Pellicano has a current medication list which includes the following long-term medication(s):  albuterol, bupropion, tylenol pm extra strength, donepezil, ezetimibe, losartan-hydrochlorothiazide, pantoprazole, sertraline, spiriva handihaler, and pregabalin.  Pharmacotherapy (Medications Ordered): No orders of the defined types were placed in this encounter.  Orders:  Orders Placed This Encounter  Procedures   Cervical Epidural Injection    Sedation: Patient's choice. Purpose: Therapeutic Indication(s): Radiculitis and cervicalgia associater with cervical degenerative disc disease.    Standing Status:   Future    Standing Expiration Date:   08/01/2021    Scheduling Instructions:     Procedure: Cervical Epidural Steroid Injection/Block     Level(s): C7-T1     Laterality: Right-sided     Timeframe: As soon as schedule allows    Order Specific Question:   Where will this procedure be performed?    Answer:   [facility]    Comments:   by Dr. Dossie Arbour    Follow-up plan:   Return for [facility]) procedure: (R) CESI #2, (Sed-anx).     Interventional Therapies  Risk  Complexity Considerations:   Estimated body mass index is 24.63 kg/m as calculated from the following:   Height as of this encounter: 5' 8" (1.727 m).   Weight as of this encounter: 162 lb (73.5 kg). Allergy to contrast dye (ANAPHYLACTIC)  Allergy to iodine/shellfish    Planned  Pending:   Therapeutic right cervical ESI #2   Appointment to have the spinal cord stimulator evaluated and reprogrammed. She also needs to go and have the x-rays of her thoracic spine which I ordered   Under consideration:   Therapeutic right cervical ESI #2     Completed:   Diagnostic bilateral lumbar spinal cord stimulator trial (08/18/2020)  Therapeutic/palliative left L3, L4 lumbar sympathetic block x6 (03/29/2020) (100/25/75/85) Diagnostic bilateral cervical facet MBB x1 (03/12/2019) (90/90/25 x2 weeks/0)  Diagnostic bilateral Thoracic T7, T8, T9, & T10 Facet MBB x1 (01/08/2019) (80/100/100)  Diagnostic/therapeutic left CESI  x1 (12/04/2018) (100/100/100)  Diagnostic/therapeutic right CESI x1 (04/19/2020) (70/70/0/0)  Diagnostic bilateral lumbar facet block x2 (04/14/2019) (100/100/100 x 2 days/0)  Palliative left lumbar facet RFA x1 (05/19/2019) (100/100/80/> 75)  Palliative right lumbar facet RFA x1 (06/30/2019) (100/100/80/> 75)    Therapeutic  Palliative (PRN) options:   Therapeutic/palliative left lumbar sympathetic block #7  Diagnostic bilateral cervical facet block #2  Diagnostic bilateral Thoracic T7, T8, T9, & T10 Facet MBB #2  Therapeutic left CESI #2  Therapeutic right CESI #2  Palliative bilateral lumbar facet MBB #3  Palliative left lumbar facet RFA #2  Palliative right lumbar facet RFA #1     Recent Visits Date Type Provider Dept  03/08/21 Office Visit Milinda Pointer, MD [facility]  Showing recent visits within  past 90 days and meeting all other requirements Today's Visits Date Type Provider Dept  05/03/21 Office Visit Milinda Pointer, MD [facility]  Showing today's visits and meeting all other requirements Future Appointments Date Type Provider Dept  06/28/21 Appointment Milinda Pointer, MD [facility]  Showing future appointments within next 90 days and meeting all other requirements I discussed the assessment and treatment plan with the patient. The patient was provided an opportunity to ask questions and all were answered. The patient agreed with the plan and demonstrated an understanding of the instructions.  Patient advised to call back or seek an in-person evaluation if the symptoms or condition worsens.  Duration of encounter: 30 minutes.  Note by: Gaspar Cola, MD Date: 05/03/2021; Time: 2:46 PM

## 2021-05-22 ENCOUNTER — Telehealth: Payer: Self-pay | Admitting: Pain Medicine

## 2021-05-22 ENCOUNTER — Other Ambulatory Visit: Payer: Self-pay | Admitting: Pain Medicine

## 2021-05-22 NOTE — Progress Notes (Signed)
Spinal cord stimulator leads with the tip over the superior endplate of T8 8.  Leads appear to enter the epidural space at the L1-2 level.  X-rays done at [facility].  No imaging available.

## 2021-05-22 NOTE — Telephone Encounter (Signed)
Patient lvmail stating she had xrays at [facility]. Says to let Dr. Dossie Arbour know so he can pull them up. Says if these are not the xrays he wanted let her know and she will come to [facility] and have them redone.

## 2021-05-22 NOTE — Telephone Encounter (Signed)
Printed imaging info and placed on FN's desk.

## 2021-05-23 ENCOUNTER — Ambulatory Visit: Payer: Medicare Other | Attending: Pain Medicine | Admitting: Pain Medicine

## 2021-05-23 NOTE — Progress Notes (Deleted)
Appointment was canceled by the patient indicating that she has a fever.

## 2021-06-26 ENCOUNTER — Encounter: Payer: Self-pay | Admitting: Pain Medicine

## 2021-06-28 ENCOUNTER — Ambulatory Visit: Payer: Medicare Other | Admitting: Pain Medicine

## 2021-12-19 ENCOUNTER — Other Ambulatory Visit: Payer: Self-pay

## 2021-12-19 ENCOUNTER — Emergency Department
Admission: EM | Admit: 2021-12-19 | Discharge: 2021-12-19 | Disposition: A | Discharge status: Home / Self Care | Payer: Medicare Other | Attending: Emergency Medicine | Admitting: Emergency Medicine

## 2021-12-19 DIAGNOSIS — R220 Localized swelling, mass and lump, head: Secondary | ICD-10-CM | POA: Insufficient documentation

## 2021-12-19 DIAGNOSIS — Z5321 Procedure and treatment not carried out due to patient leaving prior to being seen by health care provider: Secondary | ICD-10-CM | POA: Diagnosis not present

## 2021-12-19 LAB — BASIC METABOLIC PANEL
Anion gap: 5 (ref 5–15)
BUN: 33 mg/dL — ABNORMAL HIGH (ref 6–20)
CO2: 27 mmol/L (ref 22–32)
Calcium: 8.9 mg/dL (ref 8.9–10.3)
Chloride: 109 mmol/L (ref 98–111)
Creatinine, Ser: 1.07 mg/dL — ABNORMAL HIGH (ref 0.44–1.00)
GFR, Estimated: 60 mL/min — ABNORMAL LOW (ref >60)
Glucose, Bld: 109 mg/dL — ABNORMAL HIGH (ref 70–99)
Potassium: 3.9 mmol/L (ref 3.5–5.1)
Sodium: 141 mmol/L (ref 135–145)

## 2021-12-19 LAB — CBC WITH DIFFERENTIAL/PLATELET
Abs Immature Granulocytes: 0.01 10*3/uL (ref 0.00–0.07)
Basophils Absolute: 0.1 10*3/uL (ref 0.0–0.1)
Basophils Relative: 1 %
Eosinophils Absolute: 0.3 10*3/uL (ref 0.0–0.5)
Eosinophils Relative: 5 %
HCT: 39.7 % (ref 36.0–46.0)
Hemoglobin: 13.2 g/dL (ref 12.0–15.0)
Immature Granulocytes: 0 %
Lymphocytes Relative: 37 %
Lymphs Abs: 2.5 10*3/uL (ref 0.7–4.0)
MCH: 30.2 pg (ref 26.0–34.0)
MCHC: 33.2 g/dL (ref 30.0–36.0)
MCV: 90.8 fL (ref 80.0–100.0)
Monocytes Absolute: 0.5 10*3/uL (ref 0.1–1.0)
Monocytes Relative: 8 %
Neutro Abs: 3.3 10*3/uL (ref 1.7–7.7)
Neutrophils Relative %: 49 %
Platelets: 188 10*3/uL (ref 150–400)
RBC: 4.37 MIL/uL (ref 3.87–5.11)
RDW: 13.2 % (ref 11.5–15.5)
WBC: 6.7 10*3/uL (ref 4.0–10.5)
nRBC: 0 % (ref 0.0–0.2)

## 2021-12-19 NOTE — ED Notes (Signed)
SST sent to lab in case ESR ordered. CRP can be run off same lavender tube.

## 2021-12-19 NOTE — ED Triage Notes (Signed)
Pt to ED POV for lupus flare since yesterday morning. Pt has facial redness and swelling that started yesterday morning. Whole face appears red, including periorbital area. States she overexerted herself staying up reading and this may have caused flare up. States whole face is painful, 8/10 and it hurts to talk. Skin is dry and cracking. States did not take any of her regular AM meds today.

## 2022-07-02 ENCOUNTER — Other Ambulatory Visit: Payer: Self-pay | Admitting: Internal Medicine

## 2022-07-02 DIAGNOSIS — Z1231 Encounter for screening mammogram for malignant neoplasm of breast: Secondary | ICD-10-CM

## 2023-01-23 ENCOUNTER — Other Ambulatory Visit: Payer: Self-pay

## 2023-01-23 ENCOUNTER — Emergency Department
Admission: EM | Admit: 2023-01-23 | Discharge: 2023-01-23 | Disposition: A | Discharge status: Home / Self Care | Payer: 59 | Attending: Emergency Medicine | Admitting: Emergency Medicine

## 2023-01-23 DIAGNOSIS — L03113 Cellulitis of right upper limb: Secondary | ICD-10-CM | POA: Diagnosis not present

## 2023-01-23 LAB — BASIC METABOLIC PANEL
Anion gap: 10 (ref 5–15)
BUN: 21 mg/dL — ABNORMAL HIGH (ref 6–20)
CO2: 21 mmol/L — ABNORMAL LOW (ref 22–32)
Calcium: 8.5 mg/dL — ABNORMAL LOW (ref 8.9–10.3)
Chloride: 105 mmol/L (ref 98–111)
Creatinine, Ser: 1.17 mg/dL — ABNORMAL HIGH (ref 0.44–1.00)
GFR, Estimated: 53 mL/min — ABNORMAL LOW (ref >60)
Glucose, Bld: 102 mg/dL — ABNORMAL HIGH (ref 70–99)
Potassium: 4.5 mmol/L (ref 3.5–5.1)
Sodium: 136 mmol/L (ref 135–145)

## 2023-01-23 LAB — CBC
HCT: 45.1 % (ref 36.0–46.0)
Hemoglobin: 14.5 g/dL (ref 12.0–15.0)
MCH: 29.6 pg (ref 26.0–34.0)
MCHC: 32.2 g/dL (ref 30.0–36.0)
MCV: 92 fL (ref 80.0–100.0)
Platelets: 196 10*3/uL (ref 150–400)
RBC: 4.9 MIL/uL (ref 3.87–5.11)
RDW: 13.2 % (ref 11.5–15.5)
WBC: 8.4 10*3/uL (ref 4.0–10.5)
nRBC: 0 % (ref 0.0–0.2)

## 2023-01-23 MED ORDER — CEPHALEXIN 500 MG PO CAPS
500.0000 mg | ORAL_CAPSULE | Freq: Once | ORAL | Status: AC
  Administered 2023-01-23: 500 mg via ORAL
  Filled 2023-01-23: qty 1

## 2023-01-23 MED ORDER — SULFAMETHOXAZOLE-TRIMETHOPRIM 800-160 MG PO TABS
1.0000 | ORAL_TABLET | Freq: Once | ORAL | Status: AC
  Administered 2023-01-23: 1 via ORAL
  Filled 2023-01-23: qty 1

## 2023-01-23 MED ORDER — CEPHALEXIN 500 MG PO CAPS: 500.0000 mg | ORAL_CAPSULE | Freq: Two times a day (BID) | ORAL | 0 refills | Status: DC

## 2023-01-23 MED ORDER — SULFAMETHOXAZOLE-TRIMETHOPRIM 800-160 MG PO TABS: 1.0000 | ORAL_TABLET | Freq: Two times a day (BID) | ORAL | 0 refills | Status: DC

## 2023-01-23 NOTE — ED Provider Notes (Signed)
[facility] Provider Note    Event Date/Time   First MD Initiated Contact with Patient 01/23/23 1841     (approximate)   History   Insect Bite   HPI  Samantha Macias is a 61 y.o. female who presents with complaints of possible insect bite and redness extending up the right arm.       Physical Exam   Triage Vital Signs: ED Triage Vitals [01/23/23 1836]  Encounter Vitals Group     BP 126/89     Systolic BP Percentile      Diastolic BP Percentile      Pulse Rate 90     Resp 17     Temp 98 F (36.7 C)     Temp Source Oral     SpO2 98 %     Weight 108.9 kg (240 lb)     Height 1.727 m (5\' 8" )     Head Circumference      Peak Flow      Pain Score 6     Pain Loc      Pain Education      Exclude from Growth Chart     Most recent vital signs: Vitals:   01/23/23 1836  BP: 126/89  Pulse: 90  Resp: 17  Temp: 98 F (36.7 C)  SpO2: 98%     General: Awake, no distress.  CV:  Good peripheral perfusion.  Resp:  Normal effort.  Abd:  No distention.  Other:  Right upper extremity: Draining blister on the dorsal aspect of the right hand with streaking redness up the right arm, no fluctuance or drainable collection   ED Results / Procedures / Treatments   Labs (all labs ordered are listed, but only abnormal results are displayed) Labs Reviewed  BASIC METABOLIC PANEL - Abnormal; Notable for the following components:      Result Value   CO2 21 (*)    Glucose, Bld 102 (*)    BUN 21 (*)    Creatinine, Ser 1.17 (*)    Calcium 8.5 (*)    GFR, Estimated 53 (*)    All other components within normal limits  CBC     EKG     RADIOLOGY     PROCEDURES:  Critical Care performed:   Procedures   MEDICATIONS ORDERED IN ED: Medications  cephALEXin (KEFLEX) capsule 500 mg (500 mg Oral Given 01/23/23 1900)  sulfamethoxazole-trimethoprim (BACTRIM DS) 800-160 MG per tablet 1 tablet (1 tablet Oral Given 01/23/23 1900)      IMPRESSION / MDM / ASSESSMENT AND PLAN / ED COURSE  I reviewed the triage vital signs and the nursing notes. Patient's presentation is most consistent with acute complicated illness / injury requiring diagnostic workup.  Exam is consistent with cellulitis, no evidence of abscess.  Lab work reassuring, afebrile, no signs of sepsis.  Will treat with p.o. antibiotics, return precautions discussed, patient agrees with this plan        FINAL CLINICAL IMPRESSION(S) / ED DIAGNOSES   Final diagnoses:  Cellulitis of right upper extremity     Rx / DC Orders   ED Discharge Orders          Ordered    sulfamethoxazole-trimethoprim (BACTRIM DS) 800-160 MG tablet  2 times daily        01/23/23 1856    cephALEXin (KEFLEX) 500 MG capsule  2 times daily        01/23/23 1856  Note:  This document was prepared using Dragon voice recognition software and may include unintentional dictation errors.   Jene Every, MD 01/23/23 2126

## 2023-01-23 NOTE — ED Triage Notes (Signed)
Pt sts that she has a insect bite on her right hand. Pt sts that it is tracking up her arm and into her arm pit.

## 2023-09-28 ENCOUNTER — Encounter: Payer: Self-pay | Admitting: Radiology

## 2023-09-28 ENCOUNTER — Emergency Department
Admission: EM | Admit: 2023-09-28 | Discharge: 2023-09-29 | Disposition: A | Discharge status: Home / Self Care | Attending: Emergency Medicine | Admitting: Emergency Medicine

## 2023-09-28 ENCOUNTER — Emergency Department

## 2023-09-28 ENCOUNTER — Other Ambulatory Visit: Payer: Self-pay

## 2023-09-28 DIAGNOSIS — R7989 Other specified abnormal findings of blood chemistry: Secondary | ICD-10-CM | POA: Insufficient documentation

## 2023-09-28 DIAGNOSIS — Z9181 History of falling: Secondary | ICD-10-CM | POA: Diagnosis present

## 2023-09-28 DIAGNOSIS — S6292XA Unspecified fracture of left wrist and hand, initial encounter for closed fracture: Secondary | ICD-10-CM | POA: Insufficient documentation

## 2023-09-28 DIAGNOSIS — R4182 Altered mental status, unspecified: Secondary | ICD-10-CM | POA: Diagnosis present

## 2023-09-28 DIAGNOSIS — I10 Essential (primary) hypertension: Secondary | ICD-10-CM | POA: Insufficient documentation

## 2023-09-28 DIAGNOSIS — I700 Atherosclerosis of aorta: Secondary | ICD-10-CM | POA: Insufficient documentation

## 2023-09-28 DIAGNOSIS — S6992XA Unspecified injury of left wrist, hand and finger(s), initial encounter: Secondary | ICD-10-CM | POA: Diagnosis present

## 2023-09-28 DIAGNOSIS — N179 Acute kidney failure, unspecified: Secondary | ICD-10-CM | POA: Diagnosis not present

## 2023-09-28 DIAGNOSIS — S62102A Fracture of unspecified carpal bone, left wrist, initial encounter for closed fracture: Secondary | ICD-10-CM

## 2023-09-28 DIAGNOSIS — W14XXXA Fall from tree, initial encounter: Secondary | ICD-10-CM | POA: Diagnosis not present

## 2023-09-28 DIAGNOSIS — J439 Emphysema, unspecified: Secondary | ICD-10-CM | POA: Diagnosis not present

## 2023-09-28 LAB — CBC WITH DIFFERENTIAL/PLATELET
Abs Immature Granulocytes: 0.03 10*3/uL (ref 0.00–0.07)
Basophils Absolute: 0.1 10*3/uL (ref 0.0–0.1)
Basophils Relative: 1 %
Eosinophils Absolute: 0.4 10*3/uL (ref 0.0–0.5)
Eosinophils Relative: 5 %
HCT: 42.2 % (ref 36.0–46.0)
Hemoglobin: 13.9 g/dL (ref 12.0–15.0)
Immature Granulocytes: 0 %
Lymphocytes Relative: 28 %
Lymphs Abs: 2.2 10*3/uL (ref 0.7–4.0)
MCH: 30.8 pg (ref 26.0–34.0)
MCHC: 32.9 g/dL (ref 30.0–36.0)
MCV: 93.6 fL (ref 80.0–100.0)
Monocytes Absolute: 0.5 10*3/uL (ref 0.1–1.0)
Monocytes Relative: 7 %
Neutro Abs: 4.6 10*3/uL (ref 1.7–7.7)
Neutrophils Relative %: 59 %
Platelets: 214 10*3/uL (ref 150–400)
RBC: 4.51 MIL/uL (ref 3.87–5.11)
RDW: 13.3 % (ref 11.5–15.5)
WBC: 7.7 10*3/uL (ref 4.0–10.5)
nRBC: 0 % (ref 0.0–0.2)

## 2023-09-28 LAB — TROPONIN I (HIGH SENSITIVITY)
Troponin I (High Sensitivity): 6 ng/L (ref <18)
Troponin I (High Sensitivity): 5 ng/L (ref <18)

## 2023-09-28 LAB — BASIC METABOLIC PANEL WITH GFR
Anion gap: 15 (ref 5–15)
BUN: 21 mg/dL (ref 8–23)
CO2: 21 mmol/L — ABNORMAL LOW (ref 22–32)
Calcium: 9.2 mg/dL (ref 8.9–10.3)
Chloride: 101 mmol/L (ref 98–111)
Creatinine, Ser: 2.78 mg/dL — ABNORMAL HIGH (ref 0.44–1.00)
GFR, Estimated: 19 mL/min — ABNORMAL LOW (ref >60)
Glucose, Bld: 137 mg/dL — ABNORMAL HIGH (ref 70–99)
Potassium: 4.4 mmol/L (ref 3.5–5.1)
Sodium: 137 mmol/L (ref 135–145)

## 2023-09-28 MED ORDER — ONDANSETRON HCL 4 MG/2ML IJ SOLN
4.0000 mg | Freq: Once | INTRAMUSCULAR | Status: AC
  Administered 2023-09-28: 4 mg via INTRAVENOUS
  Filled 2023-09-28: qty 2

## 2023-09-28 MED ORDER — BUPIVACAINE HCL 0.5 % IJ SOLN
50.0000 mL | Freq: Once | INTRAMUSCULAR | Status: AC
  Administered 2023-09-28: 30 mL
  Filled 2023-09-28: qty 50

## 2023-09-28 MED ORDER — HYDROMORPHONE HCL 1 MG/ML IJ SOLN
1.0000 mg | Freq: Once | INTRAMUSCULAR | Status: AC
  Administered 2023-09-28: 1 mg via INTRAVENOUS
  Filled 2023-09-28: qty 1

## 2023-09-28 MED ORDER — HYDROMORPHONE HCL 1 MG/ML IJ SOLN: 1.0000 mg | Freq: Once | INTRAMUSCULAR | Status: AC

## 2023-09-28 MED ORDER — HYDROMORPHONE HCL 1 MG/ML IJ SOLN
INTRAMUSCULAR | Status: AC
  Administered 2023-09-28: 1 mg via INTRAVENOUS
  Filled 2023-09-28: qty 1

## 2023-09-28 MED ORDER — SODIUM CHLORIDE 0.9 % IV BOLUS
1000.0000 mL | Freq: Once | INTRAVENOUS | Status: AC
  Administered 2023-09-28: 1000 mL via INTRAVENOUS

## 2023-09-28 MED ORDER — HYDROMORPHONE HCL 1 MG/ML IJ SOLN
0.5000 mg | Freq: Once | INTRAMUSCULAR | Status: AC
  Administered 2023-09-28: 0.5 mg via INTRAVENOUS
  Filled 2023-09-28: qty 0.5

## 2023-09-28 MED ORDER — BUPIVACAINE HCL 0.5 % IJ SOLN
50.0000 mL | Freq: Once | INTRAMUSCULAR | Status: DC
Start: 2023-09-28 — End: 2023-09-28

## 2023-09-28 MED ORDER — LIDOCAINE HCL 2 % IJ SOLN
10.0000 mL | Freq: Once | INTRAMUSCULAR | Status: AC
  Administered 2023-09-28: 200 mg
  Filled 2023-09-28: qty 10

## 2023-09-28 MED ORDER — OXYCODONE HCL 5 MG PO TABS
5.0000 mg | ORAL_TABLET | Freq: Four times a day (QID) | ORAL | 0 refills | Status: AC | PRN
Start: 2023-09-28 — End: 2023-10-03

## 2023-09-28 NOTE — Discharge Instructions (Addendum)
 We recommended admission to the hospital due to your worsening kidney dysfunction you should drink plenty of fluid including Gatorade without sugar, Pedialyte hold your losartan , hydrochlorothiazide  for the next 3 to 4 days and follow-up with a recheck of your kidney function next week with your primary care doctor.  For your wrist fracture do not take any ibuprofen  as this can worsen your kidney function.  You can take Tylenol  1 g every 8 hours with the oxycodone .  Do not take at the same time as the alprazolam  as these can be sedating.  Please call the orthopedic doctor to make a follow-up appointment.  You are going to need surgery on your wrist.  IMPRESSION: 1. Acute transverse nondisplaced distal ulnar metadiaphysis fracture as well as acute comminuted minimally displaced ulnar styloid fracture. 2. Acute, intra-articular, impacted and displaced, comminuted fracture of the distal radius.

## 2023-09-28 NOTE — ED Provider Notes (Signed)
 [facility] Provider Note    Event Date/Time   First MD Initiated Contact with Patient 09/28/23 1911     (approximate)   History   Fall   HPI  Samantha Macias is a 63 y.o. female with history of HTN who comes in with a fall.  Patient reports falling onto her left wrist.  She reportedly tripped on a tree root but patient states that she is not exactly sure how she fell.  She does report left wrist pain.  Does not think she hit her head   Physical Exam   Triage Vital Signs: ED Triage Vitals  Encounter Vitals Group     BP 09/28/23 1910 124/81     Systolic BP Percentile --      Diastolic BP Percentile --      Pulse Rate 09/28/23 1910 88     Resp --      Temp 09/28/23 1910 98.1 F (36.7 C)     Temp Source 09/28/23 1910 Oral     SpO2 09/28/23 1910 93 %     Weight 09/28/23 1905 203 lb (92.1 kg)     Height 09/28/23 1905 5\' 7"  (1.702 m)     Head Circumference --      Peak Flow --      Pain Score 09/28/23 1957 9     Pain Loc --      Pain Education --      Exclude from Growth Chart --     Most recent vital signs: Vitals:   09/28/23 1910  BP: 124/81  Pulse: 88  Temp: 98.1 F (36.7 C)  SpO2: 93%     General: Awake, no distress.  CV:  Good peripheral perfusion.  Resp:  Normal effort.  Abd:  No distention.  Other:  Deformity noted left wrist with neurovascularly intact. No open abrasions  No chest wall tenderness no abdominal tenderness.  No evidence of trauma to the head  ED Results / Procedures / Treatments   Labs (all labs ordered are listed, but only abnormal results are displayed) Labs Reviewed  BASIC METABOLIC PANEL WITH GFR - Abnormal; Notable for the following components:      Result Value   CO2 21 (*)    Glucose, Bld 137 (*)    Creatinine, Ser 2.78 (*)    GFR, Estimated 19 (*)    All other components within normal limits  CBC WITH DIFFERENTIAL/PLATELET  TROPONIN I (HIGH SENSITIVITY)  TROPONIN I (HIGH SENSITIVITY)      EKG  My interpretation of EKG:  Sinus rate of 75 without any ST elevation or T wave versions, normal intervals  RADIOLOGY I have reviewed the xray personally and interpreted positive fracture of the wrist   PROCEDURES:  Critical Care performed: No  .Reduction of dislocation  Date/Time: 09/28/2023 11:27 PM  Performed by: Lubertha Rush, MD Authorized by: Lubertha Rush, MD  Consent: Verbal consent obtained. Consent given by: patient Patient understanding: patient states understanding of the procedure being performed Patient identity confirmed: verbally with patient Time out: Immediately prior to procedure a "time out" was called to verify the correct patient, procedure, equipment, support staff and site/side marked as required. Local anesthesia used: yes Anesthesia: hematoma block  Anesthesia: Local anesthesia used: yes Local Anesthetic: lidocaine  2% without epinephrine  and bupivacaine  0.5% without epinephrine  Anesthetic total: 10 mL  Sedation: Patient sedated: no  Patient tolerance: patient tolerated the procedure well with no immediate complications  MEDICATIONS ORDERED IN ED: Medications  HYDROmorphone  (DILAUDID ) injection 0.5 mg (0.5 mg Intravenous Given 09/28/23 1957)  ondansetron  (ZOFRAN ) injection 4 mg (4 mg Intravenous Given 09/28/23 1958)  lidocaine  (XYLOCAINE ) 2 % (with pres) injection 200 mg (200 mg Other Given 09/28/23 2135)  bupivacaine  (MARCAINE ) 0.5 % (with pres) injection 50 mL (30 mLs Infiltration Given by Other 09/28/23 2135)  sodium chloride  0.9 % bolus 1,000 mL (1,000 mLs Intravenous New Bag/Given 09/28/23 2135)  HYDROmorphone  (DILAUDID ) injection 1 mg (1 mg Intravenous Given 09/28/23 2130)  HYDROmorphone  (DILAUDID ) injection 1 mg (1 mg Intravenous Given 09/28/23 2143)  ondansetron  (ZOFRAN ) injection 4 mg (4 mg Intravenous Given 09/28/23 2246)     IMPRESSION / MDM / ASSESSMENT AND PLAN / ED COURSE  I reviewed the triage vital signs and the  nursing notes.   Patient's presentation is most consistent with acute presentation with potential threat to life or bodily function.   Patient comes in with suspected mechanical fall onto her left wrist.  She had no other injuries on examination but given she is not exactly sure how she fell and then later report of some dementia CT imaging would evaluate for intracranial hemorrhage, cervical fracture, x-rays evaluate for any fracture.  Troponins are negative x 2.  CBC reassuring BMP does show elevated creatinine of 2.78.  I reviewed her prior blood work a month ago and was 1.3  Patient's kidney function is significantly elevated from her baseline.  I recommended admission to the hospital.  She denies being on the ground a long time it was just a few minutes so I do not think this represents rhabdo.  I recommended admission to the hospital for IV fluids but patient declined stated that she wants to go home.  She is family at bedside who witnessed this conversation and states that she does make all of her own medical decisions.  She understands to hold her losartan  while her kidney function recovers, increase p.o. intake and follow-up with a PCP next week.  Will prescribe a few oxycodone  to help with pain for at home and follow-up with orthopedics outpatient for her wrist.  She expressed understanding  I did discuss the x-ray with Dr. Lydia Sams and so he will help follow-up with patient outpatient.  Patient was placed in a splint.  Patient still neurovascular intact after reduction.  Patient understands that she most likely will require surgery for this.  She feels comfortable following up outpatient and declines admission to the hospital.   IMPRESSION: 1. Acute transverse nondisplaced distal ulnar metadiaphysis fracture as well as acute comminuted minimally displaced ulnar styloid fracture. 2. Acute, intra-articular, impacted and displaced, comminuted fracture of the distal  radius.   IMPRESSION: 1. No acute intracranial abnormality. 2. No acute displaced fracture or traumatic listhesis of the cervical spine. 3. 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 4. Aortic Atherosclerosis (ICD10-I70.0) and Emphysema (ICD10-J43.9).      The patient is on the cardiac monitor to evaluate for evidence of arrhythmia and/or significant heart rate changes.      FINAL CLINICAL IMPRESSION(S) / ED DIAGNOSES   Final diagnoses:  Closed fracture of left wrist, initial encounter  AKI (acute kidney injury) (HCC)     Rx / DC Orders   ED Discharge Orders          Ordered    oxyCODONE  (ROXICODONE ) 5 MG immediate release tablet  Every 6 hours  PRN        09/28/23 2337             Note:  This document was prepared using Dragon voice recognition software and may include unintentional dictation errors.   Lubertha Rush, MD 09/28/23 [phone]

## 2023-09-28 NOTE — ED Triage Notes (Signed)
 Witnessed fall left wrist pain and deformity. No LOC, denies hitting head. No thinners. Pt has history of multiple falls. She does walk with a caine. Today she tripped over a tree root.  HR 97 143/110

## 2023-09-29 DIAGNOSIS — S6292XA Unspecified fracture of left wrist and hand, initial encounter for closed fracture: Secondary | ICD-10-CM | POA: Diagnosis not present

## 2023-09-29 MED ORDER — IPRATROPIUM-ALBUTEROL 0.5-2.5 (3) MG/3ML IN SOLN
3.0000 mL | Freq: Once | RESPIRATORY_TRACT | Status: AC
  Administered 2023-09-29: 3 mL via RESPIRATORY_TRACT
  Filled 2023-09-29: qty 3

## 2023-10-02 ENCOUNTER — Other Ambulatory Visit: Payer: Self-pay | Admitting: Orthopedic Surgery

## 2023-10-04 ENCOUNTER — Encounter: Payer: Self-pay | Admitting: Anesthesiology

## 2023-10-09 ENCOUNTER — Encounter: Payer: Self-pay | Admitting: Orthopedic Surgery

## 2023-10-11 ENCOUNTER — Ambulatory Visit: Admission: RE | Admit: 2023-10-11 | Source: Home / Self Care | Admitting: Orthopedic Surgery

## 2023-10-11 SURGERY — OPEN REDUCTION INTERNAL FIXATION (ORIF) DISTAL RADIUS FRACTURE
Anesthesia: Choice | Site: Wrist | Laterality: Left
